# Patient Record
Sex: Female | Born: 1970 | Race: Black or African American | Hispanic: No | Smoking: Current every day smoker
Health system: Southern US, Community
[De-identification: ages and names within clinical notes are randomized; demographics above are authoritative.]

## PROBLEM LIST (undated history)

## (undated) ENCOUNTER — Ambulatory Visit (HOSPITAL_COMMUNITY): Admission: EM | Disposition: A | Discharge status: Home / Self Care | Payer: Medicare Other

## (undated) ENCOUNTER — Emergency Department (HOSPITAL_COMMUNITY): Discharge status: Against Medical Advice | Payer: Self-pay

## (undated) DIAGNOSIS — D759 Disease of blood and blood-forming organs, unspecified: Secondary | ICD-10-CM

## (undated) DIAGNOSIS — K219 Gastro-esophageal reflux disease without esophagitis: Secondary | ICD-10-CM

## (undated) DIAGNOSIS — I5022 Chronic systolic (congestive) heart failure: Secondary | ICD-10-CM

## (undated) DIAGNOSIS — G8929 Other chronic pain: Secondary | ICD-10-CM

## (undated) DIAGNOSIS — F1910 Other psychoactive substance abuse, uncomplicated: Secondary | ICD-10-CM

## (undated) DIAGNOSIS — E669 Obesity, unspecified: Secondary | ICD-10-CM

## (undated) DIAGNOSIS — M549 Dorsalgia, unspecified: Secondary | ICD-10-CM

## (undated) DIAGNOSIS — I639 Cerebral infarction, unspecified: Secondary | ICD-10-CM

## (undated) DIAGNOSIS — I10 Essential (primary) hypertension: Secondary | ICD-10-CM

## (undated) DIAGNOSIS — F419 Anxiety disorder, unspecified: Secondary | ICD-10-CM

## (undated) DIAGNOSIS — F319 Bipolar disorder, unspecified: Secondary | ICD-10-CM

## (undated) DIAGNOSIS — M1990 Unspecified osteoarthritis, unspecified site: Secondary | ICD-10-CM

## (undated) DIAGNOSIS — R0602 Shortness of breath: Secondary | ICD-10-CM

## (undated) DIAGNOSIS — D649 Anemia, unspecified: Secondary | ICD-10-CM

## (undated) HISTORY — PX: TUBAL LIGATION: SHX77

## (undated) HISTORY — PX: CHOLECYSTECTOMY: SHX55

---

## 2005-12-10 ENCOUNTER — Emergency Department (HOSPITAL_COMMUNITY): Admission: EM | Admit: 2005-12-10 | Discharge: 2005-12-10 | Payer: Self-pay | Admitting: Emergency Medicine

## 2006-08-03 ENCOUNTER — Emergency Department (HOSPITAL_COMMUNITY): Admission: EM | Admit: 2006-08-03 | Discharge: 2006-08-03 | Payer: Self-pay | Admitting: Emergency Medicine

## 2007-05-13 ENCOUNTER — Emergency Department (HOSPITAL_COMMUNITY): Admission: EM | Admit: 2007-05-13 | Discharge: 2007-05-13 | Payer: Self-pay | Admitting: Emergency Medicine

## 2007-05-14 ENCOUNTER — Emergency Department (HOSPITAL_COMMUNITY): Admission: EM | Admit: 2007-05-14 | Discharge: 2007-05-14 | Payer: Self-pay | Admitting: Emergency Medicine

## 2007-09-19 ENCOUNTER — Emergency Department (HOSPITAL_COMMUNITY): Admission: EM | Admit: 2007-09-19 | Discharge: 2007-09-19 | Payer: Self-pay | Admitting: Emergency Medicine

## 2007-11-01 ENCOUNTER — Ambulatory Visit: Payer: Self-pay | Admitting: *Deleted

## 2007-11-17 ENCOUNTER — Emergency Department (HOSPITAL_COMMUNITY): Admission: EM | Admit: 2007-11-17 | Discharge: 2007-11-17 | Payer: Self-pay | Admitting: Emergency Medicine

## 2007-12-23 ENCOUNTER — Encounter (INDEPENDENT_AMBULATORY_CARE_PROVIDER_SITE_OTHER): Payer: Self-pay | Admitting: Internal Medicine

## 2007-12-23 ENCOUNTER — Ambulatory Visit: Payer: Self-pay | Admitting: Family Medicine

## 2007-12-23 LAB — CONVERTED CEMR LAB
ALT: 16 units/L (ref 0–35)
AST: 16 units/L (ref 0–37)
Basophils Absolute: 0.1 10*3/uL (ref 0.0–0.1)
Basophils Relative: 1 % (ref 0–1)
CO2: 23 meq/L (ref 19–32)
Calcium: 9.3 mg/dL (ref 8.4–10.5)
Chloride: 103 meq/L (ref 96–112)
Lymphocytes Relative: 30 % (ref 12–46)
Neutro Abs: 5.2 10*3/uL (ref 1.7–7.7)
Neutrophils Relative %: 60 % (ref 43–77)
Platelets: 392 10*3/uL (ref 150–400)
RDW: 16.8 % — ABNORMAL HIGH (ref 11.5–15.5)
Sodium: 137 meq/L (ref 135–145)
Total Protein: 7.5 g/dL (ref 6.0–8.3)

## 2007-12-28 ENCOUNTER — Ambulatory Visit: Payer: Self-pay | Admitting: Internal Medicine

## 2008-01-16 ENCOUNTER — Emergency Department (HOSPITAL_COMMUNITY): Admission: EM | Admit: 2008-01-16 | Discharge: 2008-01-16 | Payer: Self-pay | Admitting: Emergency Medicine

## 2008-03-16 ENCOUNTER — Ambulatory Visit (HOSPITAL_COMMUNITY): Admission: RE | Admit: 2008-03-16 | Discharge: 2008-03-16 | Payer: Self-pay | Admitting: Surgery

## 2008-03-16 ENCOUNTER — Encounter (INDEPENDENT_AMBULATORY_CARE_PROVIDER_SITE_OTHER): Payer: Self-pay | Admitting: Surgery

## 2008-03-30 ENCOUNTER — Emergency Department (HOSPITAL_COMMUNITY): Admission: EM | Admit: 2008-03-30 | Discharge: 2008-03-30 | Payer: Self-pay | Admitting: Emergency Medicine

## 2008-04-08 ENCOUNTER — Emergency Department (HOSPITAL_COMMUNITY): Admission: EM | Admit: 2008-04-08 | Discharge: 2008-04-08 | Payer: Self-pay | Admitting: Emergency Medicine

## 2008-04-10 ENCOUNTER — Encounter (INDEPENDENT_AMBULATORY_CARE_PROVIDER_SITE_OTHER): Payer: Self-pay | Admitting: Internal Medicine

## 2008-04-10 ENCOUNTER — Ambulatory Visit: Payer: Self-pay | Admitting: Family Medicine

## 2008-04-10 LAB — CONVERTED CEMR LAB
Benzodiazepines.: NEGATIVE
Creatinine,U: 280.6 mg/dL
Methadone: NEGATIVE
Phencyclidine (PCP): NEGATIVE
Propoxyphene: NEGATIVE

## 2008-04-18 ENCOUNTER — Emergency Department (HOSPITAL_COMMUNITY): Admission: EM | Admit: 2008-04-18 | Discharge: 2008-04-18 | Payer: Self-pay | Admitting: Family Medicine

## 2008-05-16 ENCOUNTER — Emergency Department (HOSPITAL_COMMUNITY): Admission: EM | Admit: 2008-05-16 | Discharge: 2008-05-16 | Payer: Self-pay | Admitting: Family Medicine

## 2008-06-25 ENCOUNTER — Emergency Department (HOSPITAL_COMMUNITY): Admission: EM | Admit: 2008-06-25 | Discharge: 2008-06-25 | Payer: Self-pay | Admitting: Emergency Medicine

## 2008-07-17 ENCOUNTER — Emergency Department (HOSPITAL_COMMUNITY): Admission: EM | Admit: 2008-07-17 | Discharge: 2008-07-17 | Payer: Self-pay | Admitting: Emergency Medicine

## 2008-07-18 ENCOUNTER — Emergency Department (HOSPITAL_COMMUNITY): Admission: EM | Admit: 2008-07-18 | Discharge: 2008-07-18 | Payer: Self-pay | Admitting: Emergency Medicine

## 2008-07-27 ENCOUNTER — Ambulatory Visit: Payer: Self-pay | Admitting: Family Medicine

## 2008-08-05 ENCOUNTER — Emergency Department (HOSPITAL_COMMUNITY): Admission: EM | Admit: 2008-08-05 | Discharge: 2008-08-06 | Payer: Self-pay | Admitting: Emergency Medicine

## 2008-08-17 ENCOUNTER — Emergency Department (HOSPITAL_COMMUNITY): Admission: EM | Admit: 2008-08-17 | Discharge: 2008-08-17 | Payer: Self-pay | Admitting: Emergency Medicine

## 2008-09-12 ENCOUNTER — Emergency Department (HOSPITAL_COMMUNITY): Admission: EM | Admit: 2008-09-12 | Discharge: 2008-09-13 | Payer: Self-pay | Admitting: Emergency Medicine

## 2008-09-27 ENCOUNTER — Emergency Department (HOSPITAL_COMMUNITY): Admission: EM | Admit: 2008-09-27 | Discharge: 2008-09-27 | Payer: Self-pay | Admitting: Emergency Medicine

## 2008-10-01 ENCOUNTER — Emergency Department (HOSPITAL_COMMUNITY): Admission: EM | Admit: 2008-10-01 | Discharge: 2008-10-02 | Payer: Self-pay | Admitting: Emergency Medicine

## 2008-10-02 ENCOUNTER — Ambulatory Visit: Payer: Self-pay | Admitting: Family Medicine

## 2008-10-16 ENCOUNTER — Ambulatory Visit: Payer: Self-pay | Admitting: Family Medicine

## 2008-10-20 ENCOUNTER — Emergency Department (HOSPITAL_COMMUNITY): Admission: EM | Admit: 2008-10-20 | Discharge: 2008-10-20 | Payer: Self-pay | Admitting: Emergency Medicine

## 2008-11-16 ENCOUNTER — Emergency Department (HOSPITAL_COMMUNITY): Admission: EM | Admit: 2008-11-16 | Discharge: 2008-11-17 | Payer: Self-pay | Admitting: Emergency Medicine

## 2008-11-20 ENCOUNTER — Emergency Department (HOSPITAL_COMMUNITY): Admission: EM | Admit: 2008-11-20 | Discharge: 2008-11-21 | Payer: Self-pay | Admitting: Emergency Medicine

## 2008-11-20 ENCOUNTER — Ambulatory Visit: Payer: Self-pay | Admitting: Family Medicine

## 2008-11-30 ENCOUNTER — Emergency Department (HOSPITAL_COMMUNITY): Admission: EM | Admit: 2008-11-30 | Discharge: 2008-11-30 | Payer: Self-pay | Admitting: Emergency Medicine

## 2008-12-12 ENCOUNTER — Ambulatory Visit: Payer: Self-pay | Admitting: Obstetrics and Gynecology

## 2008-12-12 ENCOUNTER — Encounter: Payer: Self-pay | Admitting: Obstetrics and Gynecology

## 2008-12-12 LAB — CONVERTED CEMR LAB
Hemoglobin: 10.7 g/dL — ABNORMAL LOW (ref 12.0–15.0)
MCHC: 30.2 g/dL (ref 30.0–36.0)
RBC: 5.89 M/uL — ABNORMAL HIGH (ref 3.87–5.11)
RDW: 16.2 % — ABNORMAL HIGH (ref 11.5–15.5)

## 2009-01-02 ENCOUNTER — Emergency Department (HOSPITAL_COMMUNITY): Admission: EM | Admit: 2009-01-02 | Discharge: 2009-01-02 | Payer: Self-pay | Admitting: Family Medicine

## 2009-01-09 ENCOUNTER — Ambulatory Visit: Payer: Self-pay | Admitting: Obstetrics and Gynecology

## 2009-02-11 ENCOUNTER — Emergency Department (HOSPITAL_COMMUNITY): Admission: EM | Admit: 2009-02-11 | Discharge: 2009-02-11 | Payer: Self-pay | Admitting: Emergency Medicine

## 2009-02-17 ENCOUNTER — Emergency Department (HOSPITAL_COMMUNITY): Admission: EM | Admit: 2009-02-17 | Discharge: 2009-02-17 | Payer: Self-pay | Admitting: Emergency Medicine

## 2009-02-18 ENCOUNTER — Telehealth (INDEPENDENT_AMBULATORY_CARE_PROVIDER_SITE_OTHER): Payer: Self-pay | Admitting: *Deleted

## 2009-02-19 ENCOUNTER — Ambulatory Visit: Payer: Self-pay | Admitting: Family Medicine

## 2009-04-05 ENCOUNTER — Emergency Department (HOSPITAL_COMMUNITY): Admission: EM | Admit: 2009-04-05 | Discharge: 2009-04-05 | Payer: Self-pay | Admitting: Family Medicine

## 2009-04-06 ENCOUNTER — Emergency Department (HOSPITAL_COMMUNITY): Admission: EM | Admit: 2009-04-06 | Discharge: 2009-04-07 | Payer: Self-pay | Admitting: Emergency Medicine

## 2009-06-20 ENCOUNTER — Ambulatory Visit: Payer: Self-pay | Admitting: Family Medicine

## 2009-10-17 ENCOUNTER — Ambulatory Visit: Payer: Self-pay | Admitting: Family Medicine

## 2010-05-14 LAB — URINALYSIS, ROUTINE W REFLEX MICROSCOPIC
Glucose, UA: NEGATIVE mg/dL
Hgb urine dipstick: NEGATIVE
Protein, ur: NEGATIVE mg/dL
Specific Gravity, Urine: 1.026 (ref 1.005–1.030)
Urobilinogen, UA: 0.2 mg/dL (ref 0.0–1.0)

## 2010-05-14 LAB — DIFFERENTIAL
Basophils Absolute: 0 10*3/uL (ref 0.0–0.1)
Basophils Relative: 0 % (ref 0–1)
Eosinophils Absolute: 0.1 10*3/uL (ref 0.0–0.7)
Lymphs Abs: 3.2 10*3/uL (ref 0.7–4.0)
Monocytes Absolute: 0.7 10*3/uL (ref 0.1–1.0)
Neutro Abs: 6.4 10*3/uL (ref 1.7–7.7)

## 2010-05-14 LAB — CBC
HCT: 32.4 % — ABNORMAL LOW (ref 36.0–46.0)
Hemoglobin: 10.3 g/dL — ABNORMAL LOW (ref 12.0–15.0)
MCHC: 31.8 g/dL (ref 30.0–36.0)
Platelets: 286 10*3/uL (ref 150–400)
RDW: 16.7 % — ABNORMAL HIGH (ref 11.5–15.5)

## 2010-05-14 LAB — URINE CULTURE

## 2010-05-14 LAB — POCT I-STAT, CHEM 8
BUN: 13 mg/dL (ref 6–23)
Chloride: 103 mEq/L (ref 96–112)
Creatinine, Ser: 0.6 mg/dL (ref 0.4–1.2)
Potassium: 3.4 mEq/L — ABNORMAL LOW (ref 3.5–5.1)
Sodium: 138 mEq/L (ref 135–145)

## 2010-05-14 LAB — GC/CHLAMYDIA PROBE AMP, GENITAL
Chlamydia, DNA Probe: NEGATIVE
GC Probe Amp, Genital: NEGATIVE

## 2010-05-14 LAB — WET PREP, GENITAL
Clue Cells Wet Prep HPF POC: NONE SEEN
Trich, Wet Prep: NONE SEEN
WBC, Wet Prep HPF POC: NONE SEEN

## 2010-05-26 LAB — URINALYSIS, ROUTINE W REFLEX MICROSCOPIC
Bilirubin Urine: NEGATIVE
Ketones, ur: NEGATIVE mg/dL
Nitrite: NEGATIVE
Urobilinogen, UA: 1 mg/dL (ref 0.0–1.0)
pH: 7 (ref 5.0–8.0)

## 2010-05-26 LAB — DIFFERENTIAL
Basophils Relative: 1 % (ref 0–1)
Eosinophils Absolute: 0.2 10*3/uL (ref 0.0–0.7)
Monocytes Absolute: 0.5 10*3/uL (ref 0.1–1.0)
Neutrophils Relative %: 64 % (ref 43–77)

## 2010-05-26 LAB — CBC
Hemoglobin: 10.1 g/dL — ABNORMAL LOW (ref 12.0–15.0)
MCHC: 30.9 g/dL (ref 30.0–36.0)
RDW: 18.1 % — ABNORMAL HIGH (ref 11.5–15.5)

## 2010-05-28 LAB — POCT URINALYSIS DIP (DEVICE)
Glucose, UA: NEGATIVE mg/dL
Nitrite: NEGATIVE

## 2010-05-28 LAB — GC/CHLAMYDIA PROBE AMP, GENITAL: Chlamydia, DNA Probe: NEGATIVE

## 2010-05-28 LAB — URINE CULTURE: Colony Count: 95000

## 2010-05-29 LAB — POCT PREGNANCY, URINE: Preg Test, Ur: NEGATIVE

## 2010-05-29 LAB — URINALYSIS, ROUTINE W REFLEX MICROSCOPIC
Nitrite: NEGATIVE
Specific Gravity, Urine: 1.023 (ref 1.005–1.030)
Urobilinogen, UA: 0.2 mg/dL (ref 0.0–1.0)

## 2010-05-29 LAB — BASIC METABOLIC PANEL
Calcium: 8.8 mg/dL (ref 8.4–10.5)
GFR calc Af Amer: >60 mL/min (ref >60)
GFR calc non Af Amer: >60 mL/min (ref >60)
Sodium: 137 mEq/L (ref 135–145)

## 2010-05-29 LAB — DIFFERENTIAL
Basophils Absolute: 0 10*3/uL (ref 0.0–0.1)
Basophils Relative: 0 % (ref 0–1)
Lymphocytes Relative: 19 % (ref 12–46)
Monocytes Relative: 5 % (ref 3–12)
Neutro Abs: 6.7 10*3/uL (ref 1.7–7.7)
Neutrophils Relative %: 74 % (ref 43–77)

## 2010-05-29 LAB — CBC
Hemoglobin: 11.1 g/dL — ABNORMAL LOW (ref 12.0–15.0)
RBC: 5.76 MIL/uL — ABNORMAL HIGH (ref 3.87–5.11)
RDW: 16 % — ABNORMAL HIGH (ref 11.5–15.5)

## 2010-05-29 LAB — WET PREP, GENITAL

## 2010-05-29 LAB — GC/CHLAMYDIA PROBE AMP, GENITAL
Chlamydia, DNA Probe: NEGATIVE
GC Probe Amp, Genital: NEGATIVE

## 2010-05-30 LAB — DIFFERENTIAL
Basophils Absolute: 0.1 10*3/uL (ref 0.0–0.1)
Basophils Relative: 1 % (ref 0–1)
Lymphocytes Relative: 25 % (ref 12–46)
Lymphocytes Relative: 33 % (ref 12–46)
Lymphs Abs: 2.3 10*3/uL (ref 0.7–4.0)
Monocytes Relative: 8 % (ref 3–12)
Monocytes Relative: 9 % (ref 3–12)
Neutro Abs: 4.9 10*3/uL (ref 1.7–7.7)
Neutro Abs: 5.7 10*3/uL (ref 1.7–7.7)
Neutrophils Relative %: 55 % (ref 43–77)
Neutrophils Relative %: 62 % (ref 43–77)

## 2010-05-30 LAB — CBC
Hemoglobin: 11.1 g/dL — ABNORMAL LOW (ref 12.0–15.0)
Hemoglobin: 12.3 g/dL (ref 12.0–15.0)
MCHC: 30.8 g/dL (ref 30.0–36.0)
MCHC: 31.3 g/dL (ref 30.0–36.0)
MCV: 61.5 fL — ABNORMAL LOW (ref 78.0–100.0)
MCV: 61.6 fL — ABNORMAL LOW (ref 78.0–100.0)
RBC: 5.73 MIL/uL — ABNORMAL HIGH (ref 3.87–5.11)
RDW: 16.1 % — ABNORMAL HIGH (ref 11.5–15.5)
RDW: 16.3 % — ABNORMAL HIGH (ref 11.5–15.5)

## 2010-05-30 LAB — BASIC METABOLIC PANEL
CO2: 30 mEq/L (ref 19–32)
Calcium: 9.9 mg/dL (ref 8.4–10.5)
Chloride: 105 mEq/L (ref 96–112)
Creatinine, Ser: 0.65 mg/dL (ref 0.4–1.2)
GFR calc Af Amer: >60 mL/min (ref >60)
Glucose, Bld: 95 mg/dL (ref 70–99)
Sodium: 141 mEq/L (ref 135–145)

## 2010-05-30 LAB — LIPASE, BLOOD: Lipase: 19 U/L (ref 11–59)

## 2010-05-30 LAB — POCT CARDIAC MARKERS
CKMB, poc: <1 ng/mL — ABNORMAL LOW (ref 1.0–8.0)
Troponin i, poc: <0.05 ng/mL (ref 0.00–0.09)

## 2010-05-30 LAB — URINALYSIS, ROUTINE W REFLEX MICROSCOPIC
Hgb urine dipstick: NEGATIVE
Ketones, ur: 15 mg/dL — AB
Nitrite: NEGATIVE
Specific Gravity, Urine: 1.027 (ref 1.005–1.030)
Urobilinogen, UA: 1 mg/dL (ref 0.0–1.0)

## 2010-05-30 LAB — COMPREHENSIVE METABOLIC PANEL
BUN: 11 mg/dL (ref 6–23)
CO2: 29 mEq/L (ref 19–32)
Calcium: 9 mg/dL (ref 8.4–10.5)
Creatinine, Ser: 0.69 mg/dL (ref 0.4–1.2)
GFR calc non Af Amer: >60 mL/min (ref >60)
Glucose, Bld: 82 mg/dL (ref 70–99)
Sodium: 139 mEq/L (ref 135–145)
Total Protein: 7.2 g/dL (ref 6.0–8.3)

## 2010-05-31 LAB — DIFFERENTIAL
Basophils Absolute: 0.1 K/uL (ref 0.0–0.1)
Basophils Relative: 1 % (ref 0–1)
Eosinophils Absolute: 0.3 K/uL (ref 0.0–0.7)
Eosinophils Relative: 4 % (ref 0–5)
Lymphocytes Relative: 23 % (ref 12–46)
Lymphs Abs: 1.9 K/uL (ref 0.7–4.0)
Monocytes Absolute: 0.7 K/uL (ref 0.1–1.0)
Monocytes Relative: 9 % (ref 3–12)
Neutro Abs: 5.1 K/uL (ref 1.7–7.7)
Neutrophils Relative %: 63 % (ref 43–77)

## 2010-05-31 LAB — GC/CHLAMYDIA PROBE AMP, GENITAL
Chlamydia, DNA Probe: NEGATIVE
GC Probe Amp, Genital: NEGATIVE

## 2010-05-31 LAB — POCT I-STAT, CHEM 8
BUN: 10 mg/dL (ref 6–23)
Calcium, Ion: 1.12 mmol/L (ref 1.12–1.32)
Chloride: 107 meq/L (ref 96–112)
Creatinine, Ser: 0.4 mg/dL (ref 0.4–1.2)
Glucose, Bld: 87 mg/dL (ref 70–99)
HCT: 37 % (ref 36.0–46.0)
Hemoglobin: 12.6 g/dL (ref 12.0–15.0)
Potassium: 3.9 meq/L (ref 3.5–5.1)
Sodium: 139 meq/L (ref 135–145)
TCO2: 21 mmol/L (ref 0–100)

## 2010-05-31 LAB — POCT PREGNANCY, URINE: Preg Test, Ur: NEGATIVE

## 2010-05-31 LAB — URINALYSIS, ROUTINE W REFLEX MICROSCOPIC
Glucose, UA: NEGATIVE mg/dL
Glucose, UA: NEGATIVE mg/dL
Ketones, ur: NEGATIVE mg/dL
Leukocytes, UA: NEGATIVE
Protein, ur: 100 mg/dL — AB
Protein, ur: NEGATIVE mg/dL
Specific Gravity, Urine: 1.03 (ref 1.005–1.030)
Urobilinogen, UA: 1 mg/dL (ref 0.0–1.0)
Urobilinogen, UA: 1 mg/dL (ref 0.0–1.0)

## 2010-05-31 LAB — URINE CULTURE
Colony Count: 35000
Colony Count: 65000

## 2010-05-31 LAB — CBC
HCT: 35.2 % — ABNORMAL LOW (ref 36.0–46.0)
Hemoglobin: 10.9 g/dL — ABNORMAL LOW (ref 12.0–15.0)
MCHC: 31.1 g/dL (ref 30.0–36.0)
MCV: 61.6 fL — ABNORMAL LOW (ref 78.0–100.0)
Platelets: 283 K/uL (ref 150–400)
RBC: 5.71 MIL/uL — ABNORMAL HIGH (ref 3.87–5.11)
RDW: 17 % — ABNORMAL HIGH (ref 11.5–15.5)
WBC: 8.1 K/uL (ref 4.0–10.5)

## 2010-05-31 LAB — URINE MICROSCOPIC-ADD ON

## 2010-06-02 LAB — DIFFERENTIAL
Basophils Absolute: 0.1 10*3/uL (ref 0.0–0.1)
Basophils Relative: 1 % (ref 0–1)
Eosinophils Absolute: 0.3 10*3/uL (ref 0.0–0.7)
Eosinophils Relative: 3 % (ref 0–5)
Monocytes Absolute: 0.5 10*3/uL (ref 0.1–1.0)

## 2010-06-02 LAB — BASIC METABOLIC PANEL
BUN: 7 mg/dL (ref 6–23)
CO2: 26 mEq/L (ref 19–32)
Glucose, Bld: 106 mg/dL — ABNORMAL HIGH (ref 70–99)
Potassium: 3.5 mEq/L (ref 3.5–5.1)
Sodium: 138 mEq/L (ref 135–145)

## 2010-06-02 LAB — CBC
HCT: 30.8 % — ABNORMAL LOW (ref 36.0–46.0)
Hemoglobin: 9.9 g/dL — ABNORMAL LOW (ref 12.0–15.0)
MCHC: 32.2 g/dL (ref 30.0–36.0)
RDW: 16.5 % — ABNORMAL HIGH (ref 11.5–15.5)

## 2010-06-02 LAB — HERPES SIMPLEX VIRUS CULTURE

## 2010-06-02 LAB — POCT CARDIAC MARKERS
CKMB, poc: <1 ng/mL — ABNORMAL LOW (ref 1.0–8.0)
Troponin i, poc: <0.05 ng/mL (ref 0.00–0.09)

## 2010-06-03 LAB — URINE MICROSCOPIC-ADD ON

## 2010-06-03 LAB — POCT PREGNANCY, URINE: Preg Test, Ur: NEGATIVE

## 2010-06-03 LAB — COMPREHENSIVE METABOLIC PANEL
AST: 24 U/L (ref 0–37)
Albumin: 3.9 g/dL (ref 3.5–5.2)
Calcium: 8.7 mg/dL (ref 8.4–10.5)
Chloride: 103 mEq/L (ref 96–112)
Creatinine, Ser: 0.51 mg/dL (ref 0.4–1.2)
GFR calc Af Amer: >60 mL/min (ref >60)

## 2010-06-03 LAB — RAPID URINE DRUG SCREEN, HOSP PERFORMED
Benzodiazepines: NOT DETECTED
Cocaine: NOT DETECTED

## 2010-06-03 LAB — RPR: RPR Ser Ql: NONREACTIVE

## 2010-06-03 LAB — URINALYSIS, ROUTINE W REFLEX MICROSCOPIC
Bilirubin Urine: NEGATIVE
Nitrite: NEGATIVE
Specific Gravity, Urine: 1.024 (ref 1.005–1.030)
Urobilinogen, UA: 1 mg/dL (ref 0.0–1.0)

## 2010-06-03 LAB — DIFFERENTIAL
Basophils Relative: 1 % (ref 0–1)
Eosinophils Relative: 1 % (ref 0–5)
Lymphs Abs: 2.4 10*3/uL (ref 0.7–4.0)
Monocytes Relative: 4 % (ref 3–12)
Neutro Abs: 6.9 10*3/uL (ref 1.7–7.7)

## 2010-06-03 LAB — CBC
MCHC: 31.4 g/dL (ref 30.0–36.0)
MCV: 59.8 fL — ABNORMAL LOW (ref 78.0–100.0)
Platelets: 324 10*3/uL (ref 150–400)
WBC: 9.9 10*3/uL (ref 4.0–10.5)

## 2010-06-03 LAB — WET PREP, GENITAL: Yeast Wet Prep HPF POC: NONE SEEN

## 2010-06-09 LAB — BASIC METABOLIC PANEL
CO2: 25 mEq/L (ref 19–32)
GFR calc Af Amer: >60 mL/min (ref >60)
GFR calc non Af Amer: >60 mL/min (ref >60)
Glucose, Bld: 92 mg/dL (ref 70–99)
Potassium: 4.1 mEq/L (ref 3.5–5.1)
Sodium: 137 mEq/L (ref 135–145)

## 2010-06-10 LAB — COMPREHENSIVE METABOLIC PANEL
ALT: 15 U/L (ref 0–35)
AST: 16 U/L (ref 0–37)
CO2: 24 mEq/L (ref 19–32)
Calcium: 8.4 mg/dL (ref 8.4–10.5)
Chloride: 102 mEq/L (ref 96–112)
GFR calc non Af Amer: >60 mL/min (ref >60)
Glucose, Bld: 105 mg/dL — ABNORMAL HIGH (ref 70–99)
Sodium: 135 mEq/L (ref 135–145)
Total Bilirubin: 0.5 mg/dL (ref 0.3–1.2)

## 2010-06-10 LAB — DIFFERENTIAL
Basophils Absolute: 0.2 10*3/uL — ABNORMAL HIGH (ref 0.0–0.1)
Eosinophils Absolute: 0.3 10*3/uL (ref 0.0–0.7)
Lymphs Abs: 2.2 10*3/uL (ref 0.7–4.0)
Monocytes Absolute: 0.6 10*3/uL (ref 0.1–1.0)
Monocytes Relative: 7 % (ref 3–12)
Neutro Abs: 5.3 10*3/uL (ref 1.7–7.7)

## 2010-06-10 LAB — CBC
Hemoglobin: 10.1 g/dL — ABNORMAL LOW (ref 12.0–15.0)
MCHC: 31.4 g/dL (ref 30.0–36.0)
MCV: 56.7 fL — ABNORMAL LOW (ref 78.0–100.0)
RBC: 5.68 MIL/uL — ABNORMAL HIGH (ref 3.87–5.11)
WBC: 8.6 10*3/uL (ref 4.0–10.5)

## 2010-06-10 LAB — LIPASE, BLOOD: Lipase: 19 U/L (ref 11–59)

## 2010-07-08 NOTE — Op Note (Signed)
NAME:  Kelly Novak, Kelly Novak NO.:  [id number]   MEDICAL RECORD NO.:  [id number]          PATIENT TYPE:  AMB   LOCATION:  DAY                          FACILITY:  [facility]   PHYSICIAN:  Ardeth Sportsman, MD     DATE OF BIRTH:  [birth year]   DATE OF PROCEDURE:  DATE OF DISCHARGE:                               OPERATIVE REPORT   PRIMARY CARE PHYSICIAN:  Over at [facility].  She is also seen by April  Palumbo-Rasch, MD, I believe at [facility].   SURGEON:  Ardeth Sportsman, MD.   ASSISTANT:  None.   PREOPERATIVE DIAGNOSES:  Right upper quadrant pain presumed to be  secondary to symptomatic cholecystolithiasis with possible chronic  cholecystitis.   POSTOPERATIVE DIAGNOSES:  1. Symptomatic cholecystolithiasis.  2. Probable chronic cholecystitis.  3. Fitz-Hugh-Curtis syndrome with dense phrenohepatic adhesions.   PROCEDURE PERFORMED:  1. Laparoscopic lysis of adhesions x 30 minutes.  2. Laparoscopic cholecystectomy with intraoperative cholangiogram.   ANESTHESIA:  1. General anesthesia.  2. Local anesthetic and field block around all port sites.   SPECIMEN:  Gallbladder.   DRAINS:  None.   ESTIMATED BLOOD LOSS:  Less than 20 mL.   COMPLICATIONS:  None apparent.   INDICATIONS:  Kelly Novak is a pleasant 40 year old morbidly obese female  with classic story of biliary colic and some persistent upper abdominal  pain.  She was found to have sludge in her gallbladder with possible  small stones.  Differential diagnoses have been discussed and relatively  ruled out.   The anatomy and physiology of hepatobiliary and pancreatic function was  discussed.  Pathophysiology of cholecystolithiasis and its natural  history and risks were discussed and options discussed.  The  recommendation was made for diagnostic laparoscopy with laparoscopic  cholecystectomy and intraoperative cholangiogram.  The risks, benefits  and alternatives were discussed.  Questions answered.  She agreed  to  proceed.   FINDINGS:  She had some mild gallbladder wall thickening and a very  narrow cystic duct.  Her cholangiogram appeared normal with classic  anatomy.   She did have some moderate amount of adhesions of her liver to her  diaphragm consistent  with Fitz-Hugh-Curtis syndrome.   DESCRIPTION OF PROCEDURE:  Informed consent was confirmed.  The patient  underwent general anesthesia without any difficulty.  She had sequential  pressure devices active during the entire case.  She had voided just  prior to going to the operating room.  She underwent general anesthesia  without any difficulty.  She was in supine with both arms tucked and the  foot board in  place.  Her abdomen was a clipped, prepped and draped in  the sterile fashion.  Surgical time-out was performed to confirm our  plan.   A 5 mm port was placed in the right upper quadrant using optical entry  technique with the patient steep in reverse Trendelenburg and right-side  up without any difficulty.  Camera inspection revealed no intraabdominal  injury.  Under direct visualization the 5 mm final ports were placed in  the right flank and just superior  to the umbilicus.  A 10 mm port was  tunneled through the falciform ligament in the subxiphoid region.   The gallbladder fundus was grasped and elevated cephalad.  Peritoneal  coverings were freed between the gallbladder and the liver on the  anteriomedial and posteriolateral walls of the gallbladder.  Circumferential dissection was done to free the proximal half of the  gallbladder off the liver bed to get a classic critical view.   I saw two structures going from gallbladder down to the porta hepatis.  One was more on the anterior wall consistent with the cystic artery.  One clip on the gallbladder side with two clips slightly proximally were  placed & the artery transected.  A smaller wispy posterior arterial  branch was isolated and cauterized.  This left the cystic  duct going  down from the infundibular gallbladder down to the porta hepatis.  It  was carefully dissected and skeletonized to get a good view.  Two clips  were placed in the infundibulum and a partial cystic ductotomy was  performed.  A 5-French cholangiogram catheter was passed through  subxiphoid stab incision and placed into the cystic duct without any  difficulty.   A cholangiogram was run using dilute radio-opaque contrast and  continuous fluoroscopy.  Contrast flowed well from a side branch  consistent with cystic duct cannulization.  Contrast flowed into the  common hepatic duct and refluxed into the right & left intrahepatic  chains.  It flowed down the common bile duct across a normal ampulla  into the duodenum consistent with a normal cholangiogram.  Cholangiocatheter was removed and four clips were placed in the cystic  duct since it was long.  Cystic duct transection was completed.   The gallbladder was freed from its remaining attachments on the liver  bed.  There was a tongue of liver, a small 1 x 2 cm tongue of liver that  was on the anteromedial gallbladder wall.  I went ahead and freed that  with the gallbladder.  The gallbladder was removed out the subxiphoid  port with some gentle fascial dilation.  The fascial defect was  reapproximated using zero Vicryl stitch without any difficulty.   Camera inspection confirmed the numerous adhesions of both lobes of  the  liver to the diaphragm.  Since she had some chronic abdominal pain as  well, therefore I went ahead and freed these off using hook cautery  under direct visualization with good release all around with  good  hemostasis.  Irrigation was done and hemostasis was excellent.  Capnoperitoneum was evacuated and the rest of the ports were removed.  Skin was closed using 4-0 Monocryl stitch.  Sterile dressing was  applied.   I discussed postoperative care with the patient just prior to surgery.  We are trying to  find her family to discuss postoperative care with the  patient as well.  I did leave detailed instructions as well.  She should  be able to go home later today.      Ardeth Sportsman, MD  Electronically Signed     SCG/MEDQ  D:  03/16/2008  T:  03/16/2008  Job:  [id number]   cc:   April Palumbo-Rasch, MD   [facility] Banker  Fax: [phone]

## 2010-08-15 ENCOUNTER — Encounter (HOSPITAL_COMMUNITY): Payer: Self-pay | Admitting: Radiology

## 2010-08-15 ENCOUNTER — Emergency Department (HOSPITAL_COMMUNITY)
Admission: EM | Admit: 2010-08-15 | Discharge: 2010-08-15 | Disposition: A | Discharge status: Home / Self Care | Payer: Self-pay | Attending: Emergency Medicine | Admitting: Emergency Medicine

## 2010-08-15 ENCOUNTER — Emergency Department (HOSPITAL_COMMUNITY): Payer: Self-pay

## 2010-08-15 DIAGNOSIS — D573 Sickle-cell trait: Secondary | ICD-10-CM | POA: Insufficient documentation

## 2010-08-15 DIAGNOSIS — R059 Cough, unspecified: Secondary | ICD-10-CM | POA: Insufficient documentation

## 2010-08-15 DIAGNOSIS — F319 Bipolar disorder, unspecified: Secondary | ICD-10-CM | POA: Insufficient documentation

## 2010-08-15 DIAGNOSIS — R109 Unspecified abdominal pain: Secondary | ICD-10-CM | POA: Insufficient documentation

## 2010-08-15 DIAGNOSIS — R05 Cough: Secondary | ICD-10-CM | POA: Insufficient documentation

## 2010-08-15 DIAGNOSIS — Z79899 Other long term (current) drug therapy: Secondary | ICD-10-CM | POA: Insufficient documentation

## 2010-08-15 DIAGNOSIS — R10819 Abdominal tenderness, unspecified site: Secondary | ICD-10-CM | POA: Insufficient documentation

## 2010-08-15 DIAGNOSIS — R112 Nausea with vomiting, unspecified: Secondary | ICD-10-CM | POA: Insufficient documentation

## 2010-08-15 DIAGNOSIS — Z9889 Other specified postprocedural states: Secondary | ICD-10-CM | POA: Insufficient documentation

## 2010-08-15 DIAGNOSIS — IMO0001 Reserved for inherently not codable concepts without codable children: Secondary | ICD-10-CM | POA: Insufficient documentation

## 2010-08-15 DIAGNOSIS — I10 Essential (primary) hypertension: Secondary | ICD-10-CM | POA: Insufficient documentation

## 2010-08-15 LAB — DIFFERENTIAL
Basophils Absolute: 0 10*3/uL (ref 0.0–0.1)
Eosinophils Relative: 1 % (ref 0–5)
Lymphocytes Relative: 25 % (ref 12–46)
Lymphs Abs: 2.6 10*3/uL (ref 0.7–4.0)
Neutro Abs: 7 10*3/uL (ref 1.7–7.7)
Neutrophils Relative %: 66 % (ref 43–77)

## 2010-08-15 LAB — COMPREHENSIVE METABOLIC PANEL
AST: 16 U/L (ref 0–37)
Albumin: 3.6 g/dL (ref 3.5–5.2)
Alkaline Phosphatase: 76 U/L (ref 39–117)
BUN: 16 mg/dL (ref 6–23)
CO2: 25 mEq/L (ref 19–32)
Chloride: 102 mEq/L (ref 96–112)
GFR calc non Af Amer: >60 mL/min (ref >60)
Potassium: 3.5 mEq/L (ref 3.5–5.1)
Total Bilirubin: 0.6 mg/dL (ref 0.3–1.2)

## 2010-08-15 LAB — CBC
HCT: 33 % — ABNORMAL LOW (ref 36.0–46.0)
MCV: 58.5 fL — ABNORMAL LOW (ref 78.0–100.0)
Platelets: 318 10*3/uL (ref 150–400)
RBC: 5.64 MIL/uL — ABNORMAL HIGH (ref 3.87–5.11)
RDW: 17.1 % — ABNORMAL HIGH (ref 11.5–15.5)
WBC: 10.5 10*3/uL (ref 4.0–10.5)

## 2010-08-15 LAB — URINE MICROSCOPIC-ADD ON

## 2010-08-15 LAB — URINALYSIS, ROUTINE W REFLEX MICROSCOPIC
Glucose, UA: NEGATIVE mg/dL
Hgb urine dipstick: NEGATIVE
Ketones, ur: 15 mg/dL — AB
Protein, ur: 30 mg/dL — AB
Urobilinogen, UA: 1 mg/dL (ref 0.0–1.0)

## 2010-08-15 MED ORDER — IOHEXOL 300 MG/ML  SOLN
100.0000 mL | Freq: Once | INTRAMUSCULAR | Status: AC | PRN
  Administered 2010-08-15: 100 mL via INTRAVENOUS

## 2010-11-17 LAB — DIFFERENTIAL
Basophils Absolute: 0.1
Basophils Relative: 1
Eosinophils Absolute: 0.4
Eosinophils Absolute: 0.4
Eosinophils Relative: 5
Lymphocytes Relative: 28
Lymphs Abs: 2.4
Monocytes Relative: 6
Monocytes Relative: 8
Neutro Abs: 4
Neutrophils Relative %: 60

## 2010-11-17 LAB — URINALYSIS, ROUTINE W REFLEX MICROSCOPIC
Glucose, UA: NEGATIVE
Protein, ur: NEGATIVE
Protein, ur: NEGATIVE
Specific Gravity, Urine: 1.029
Urobilinogen, UA: 0.2
pH: 6.5

## 2010-11-17 LAB — COMPREHENSIVE METABOLIC PANEL
AST: 14
Albumin: 3.3 — ABNORMAL LOW
Alkaline Phosphatase: 65
BUN: 14
CO2: 22
Calcium: 8.2 — ABNORMAL LOW
Chloride: 104
Chloride: 109
Creatinine, Ser: 0.97
GFR calc Af Amer: >60
GFR calc non Af Amer: >60
Potassium: 3.6
Total Bilirubin: 0.8
Total Bilirubin: 1

## 2010-11-17 LAB — CBC
Hemoglobin: 10.6 — ABNORMAL LOW
Platelets: 321
RBC: 5.61 — ABNORMAL HIGH
WBC: 7.5
WBC: 8.3

## 2010-11-21 LAB — URINALYSIS, ROUTINE W REFLEX MICROSCOPIC
Bilirubin Urine: NEGATIVE
Glucose, UA: NEGATIVE
Hgb urine dipstick: NEGATIVE
Nitrite: NEGATIVE
Specific Gravity, Urine: 1.018
pH: 8

## 2010-11-21 LAB — COMPREHENSIVE METABOLIC PANEL
Albumin: 3.5
BUN: 8
CO2: 23
Chloride: 109
Creatinine, Ser: 0.53
GFR calc non Af Amer: >60
Glucose, Bld: 92
Total Bilirubin: 0.8

## 2010-11-21 LAB — RAPID URINE DRUG SCREEN, HOSP PERFORMED
Barbiturates: NOT DETECTED
Benzodiazepines: NOT DETECTED
Opiates: NOT DETECTED

## 2010-11-21 LAB — CBC
HCT: 32.6 — ABNORMAL LOW
Hemoglobin: 10.1 — ABNORMAL LOW
MCV: 57.2 — ABNORMAL LOW
Platelets: 321
WBC: 7.4

## 2010-11-21 LAB — URINE MICROSCOPIC-ADD ON

## 2010-11-21 LAB — POCT I-STAT, CHEM 8
Hemoglobin: 11.9 — ABNORMAL LOW
Potassium: 3.8
Sodium: 139
TCO2: 21

## 2010-11-21 LAB — ACETAMINOPHEN LEVEL: Acetaminophen (Tylenol), Serum: <10 — ABNORMAL LOW

## 2010-11-21 LAB — AMMONIA: Ammonia: 15

## 2010-11-21 LAB — URINE CULTURE

## 2010-11-21 LAB — DIFFERENTIAL
Basophils Absolute: 0
Lymphocytes Relative: 29
Neutro Abs: 4.7
Neutrophils Relative %: 64

## 2010-11-21 LAB — ETHANOL: Alcohol, Ethyl (B): <5

## 2010-11-25 LAB — CBC
HCT: 30.8 — ABNORMAL LOW
Platelets: 355
WBC: 8.8

## 2010-11-25 LAB — COMPREHENSIVE METABOLIC PANEL
AST: 19
Albumin: 3.5
Alkaline Phosphatase: 93
BUN: 6
Chloride: 105
GFR calc Af Amer: >60
Potassium: 4.1
Total Bilirubin: 1

## 2010-11-25 LAB — URINALYSIS, ROUTINE W REFLEX MICROSCOPIC
Bilirubin Urine: NEGATIVE
Glucose, UA: NEGATIVE
Ketones, ur: NEGATIVE
pH: 6

## 2010-11-25 LAB — DIFFERENTIAL
Basophils Relative: 1
Eosinophils Absolute: 0.2
Monocytes Absolute: 0.4
Neutro Abs: 5.7

## 2010-12-11 LAB — BASIC METABOLIC PANEL
CO2: 25
GFR calc Af Amer: >60
GFR calc non Af Amer: >60
Glucose, Bld: 119 — ABNORMAL HIGH
Potassium: 3.5

## 2010-12-11 LAB — URINALYSIS, ROUTINE W REFLEX MICROSCOPIC
Glucose, UA: NEGATIVE
Leukocytes, UA: NEGATIVE
Nitrite: NEGATIVE
Specific Gravity, Urine: 1.031 — ABNORMAL HIGH
pH: 6

## 2010-12-11 LAB — DIFFERENTIAL
Basophils Absolute: 0.1
Eosinophils Relative: 3
Lymphocytes Relative: 26
Monocytes Absolute: 0.4

## 2010-12-11 LAB — RAPID URINE DRUG SCREEN, HOSP PERFORMED
Amphetamines: NOT DETECTED
Cocaine: POSITIVE — AB
Opiates: NOT DETECTED
Tetrahydrocannabinol: POSITIVE — AB

## 2010-12-11 LAB — URINE MICROSCOPIC-ADD ON

## 2010-12-11 LAB — CBC
HCT: 35 — ABNORMAL LOW
Hemoglobin: 11.1 — ABNORMAL LOW
RDW: 15 — ABNORMAL HIGH

## 2010-12-11 LAB — ETHANOL: Alcohol, Ethyl (B): <5

## 2010-12-11 LAB — PREGNANCY, URINE: Preg Test, Ur: NEGATIVE

## 2011-06-07 ENCOUNTER — Emergency Department (HOSPITAL_COMMUNITY)
Admission: EM | Admit: 2011-06-07 | Discharge: 2011-06-07 | Disposition: A | Discharge status: Home / Self Care | Payer: No Typology Code available for payment source | Attending: Emergency Medicine | Admitting: Emergency Medicine

## 2011-06-07 ENCOUNTER — Emergency Department (HOSPITAL_COMMUNITY): Payer: No Typology Code available for payment source

## 2011-06-07 ENCOUNTER — Encounter (HOSPITAL_COMMUNITY): Payer: Self-pay | Admitting: *Deleted

## 2011-06-07 DIAGNOSIS — S39012A Strain of muscle, fascia and tendon of lower back, initial encounter: Secondary | ICD-10-CM

## 2011-06-07 DIAGNOSIS — S8000XA Contusion of unspecified knee, initial encounter: Secondary | ICD-10-CM | POA: Insufficient documentation

## 2011-06-07 DIAGNOSIS — N938 Other specified abnormal uterine and vaginal bleeding: Secondary | ICD-10-CM | POA: Insufficient documentation

## 2011-06-07 DIAGNOSIS — Y92410 Unspecified street and highway as the place of occurrence of the external cause: Secondary | ICD-10-CM | POA: Insufficient documentation

## 2011-06-07 DIAGNOSIS — M25569 Pain in unspecified knee: Secondary | ICD-10-CM | POA: Insufficient documentation

## 2011-06-07 DIAGNOSIS — M545 Low back pain, unspecified: Secondary | ICD-10-CM | POA: Insufficient documentation

## 2011-06-07 DIAGNOSIS — N949 Unspecified condition associated with female genital organs and menstrual cycle: Secondary | ICD-10-CM | POA: Insufficient documentation

## 2011-06-07 DIAGNOSIS — S335XXA Sprain of ligaments of lumbar spine, initial encounter: Secondary | ICD-10-CM | POA: Insufficient documentation

## 2011-06-07 DIAGNOSIS — IMO0001 Reserved for inherently not codable concepts without codable children: Secondary | ICD-10-CM | POA: Insufficient documentation

## 2011-06-07 DIAGNOSIS — S8002XA Contusion of left knee, initial encounter: Secondary | ICD-10-CM

## 2011-06-07 LAB — POCT I-STAT, CHEM 8
BUN: 10 mg/dL (ref 6–23)
Calcium, Ion: 1.12 mmol/L (ref 1.12–1.32)
Chloride: 106 meq/L (ref 96–112)
Creatinine, Ser: 0.7 mg/dL (ref 0.50–1.10)
Glucose, Bld: 84 mg/dL (ref 70–99)
HCT: 33 % — ABNORMAL LOW (ref 36.0–46.0)
Hemoglobin: 11.2 g/dL — ABNORMAL LOW (ref 12.0–15.0)
Potassium: 3.9 meq/L (ref 3.5–5.1)
Sodium: 141 meq/L (ref 135–145)
TCO2: 24 mmol/L (ref 0–100)

## 2011-06-07 LAB — CBC
HCT: 31.8 % — ABNORMAL LOW (ref 36.0–46.0)
MCH: 18.3 pg — ABNORMAL LOW (ref 26.0–34.0)
MCHC: 31.1 g/dL (ref 30.0–36.0)
MCV: 58.8 fL — ABNORMAL LOW (ref 78.0–100.0)
RDW: 16.3 % — ABNORMAL HIGH (ref 11.5–15.5)

## 2011-06-07 LAB — DIFFERENTIAL
Basophils Absolute: 0.1 10*3/uL (ref 0.0–0.1)
Basophils Relative: 1 % (ref 0–1)
Eosinophils Absolute: 0.2 10*3/uL (ref 0.0–0.7)
Eosinophils Relative: 2 % (ref 0–5)
Monocytes Absolute: 0.4 10*3/uL (ref 0.1–1.0)

## 2011-06-07 MED ORDER — OXYCODONE-ACETAMINOPHEN 5-325 MG PO TABS: 1.0000 | ORAL_TABLET | ORAL | Status: AC | PRN

## 2011-06-07 MED ORDER — HYDROMORPHONE HCL PF 1 MG/ML IJ SOLN
1.0000 mg | Freq: Once | INTRAMUSCULAR | Status: AC
  Administered 2011-06-07: 1 mg via INTRAMUSCULAR
  Filled 2011-06-07: qty 1

## 2011-06-07 MED ORDER — ONDANSETRON HCL 4 MG/2ML IJ SOLN
4.0000 mg | Freq: Once | INTRAMUSCULAR | Status: AC
  Administered 2011-06-07: 4 mg via INTRAMUSCULAR
  Filled 2011-06-07: qty 2

## 2011-06-07 NOTE — ED Notes (Signed)
PT with multiple complaints. States she was in [facility] on Tuesday and now has sharp pain down left side including neck, arm, lower back, hip, and leg. Also states that her MP started and she has larger clots than normal. Plus she is feeling anorexic

## 2011-06-07 NOTE — ED Notes (Signed)
Pt states understanding of discharge instructions 
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
## 2011-06-07 NOTE — ED Provider Notes (Signed)
Medical screening examination/treatment/procedure(s) were performed by non-physician practitioner and as supervising physician I was immediately available for consultation/collaboration.   Vida Roller, MD 06/07/11 [phone]

## 2011-06-07 NOTE — ED Notes (Signed)
She is also c/o a heavy period

## 2011-06-07 NOTE — Discharge Instructions (Signed)
Abnormal Uterine Bleeding Abnormal uterine bleeding can have many causes. Some cases are simply treated, while others are more serious. There are several kinds of bleeding that is considered abnormal, including:  Bleeding between periods.   Bleeding after sexual intercourse.   Spotting anytime in the menstrual cycle.   Bleeding heavier or more than normal.   Bleeding after menopause.  CAUSES  There are many causes of abnormal uterine bleeding. It can be present in teenagers, pregnant women, women during their reproductive years, and women who have reached menopause. Your caregiver will look for the more common causes depending on your age, signs, symptoms and your particular circumstance. Most cases are not serious and can be treated. Even the more serious causes, like cancer of the female organs, can be treated adequately if found in the early stages. That is why all types of bleeding should be evaluated and treated as soon as possible. DIAGNOSIS  Diagnosing the cause may take several kinds of tests. Your caregiver may:  Take a complete history of the type of bleeding.   Perform a complete physical exam and Pap smear.   Take an ultrasound on the abdomen showing a picture of the female organs and the pelvis.   Inject dye into the uterus and Fallopian tubes and X-ray them (hysterosalpingogram).   Place fluid in the uterus and do an ultrasound (sonohysterogrqphy).   Take a CT scan to examine the female organs and pelvis.   Take an MRI to examine the female organs and pelvis. There is no X-ray involved with this procedure.   Look inside the uterus with a telescope that has a light at the end (hysteroscopy).   Scrap the inside of the uterus to get tissue to examine (Dilatation and Curettage, D&C).   Look into the pelvis with a telescope that has a light at the end (laparoscopy). This is done through a very small cut (incision) in the abdomen.  TREATMENT  Treatment will depend on the  cause of the abnormal bleeding. It can include:  Doing nothing to allow the problem to take care of itself over time.   Hormone treatment.   Birth control pills.   Treating the medical condition causing the problem.   Laparoscopy.   Major or minor surgery   Destroying the lining of the uterus with electrical currant, laser, freezing or heat (uterine ablation).  HOME CARE INSTRUCTIONS   Follow your caregiver's recommendation on how to treat your problem.   See your caregiver if you missed a menstrual period and think you may be pregnant.   If you are bleeding heavily, count the number of pads/tampons you use and how often you have to change them. Tell this to your caregiver.   Avoid sexual intercourse until the problem is controlled.  SEEK MEDICAL CARE IF:   You have any kind of abnormal bleeding mentioned above.   You feel dizzy at times.   You are 41 years old and have not had a menstrual period yet.  SEEK IMMEDIATE MEDICAL CARE IF:   You pass out.   You are changing pads/tampons every 15 to 30 minutes.   You have belly (abdominal) pain.   You have a temperature of 100 F (37.8 C) or higher.   You become sweaty or weak.   You are passing large blood clots from the vagina.   You start to feel sick to your stomach (nauseous) and throw up (vomit).  Document Released: 02/09/2005 Document Revised: 01/29/2011 Document Reviewed: 07/05/2008 [employer]   Patient Information 2012 [place], [place].Menorrhagia Dysfunctional uterine bleeding is different from a normal menstrual period. When periods are heavy or there is more bleeding than is usual for you, it is called menorrhagia. It may be caused by hormonal imbalance, or physical, metabolic, or other problems. Examination is necessary in order that your caregiver may treat treatable causes. If this is a continuing problem, a D&C may be needed. That means that the cervix (the opening of the uterus or womb) is dilated (stretched  larger) and the lining of the uterus is scraped out. The tissue scraped out is then examined under a microscope by a specialist (pathologist) to make sure there is nothing of concern that needs further or more extensive treatment. HOME CARE INSTRUCTIONS   If medications were prescribed, take exactly as directed. Do not change or switch medications without consulting your caregiver.   Long term heavy bleeding may result in iron deficiency. Your caregiver may have prescribed iron pills. They help replace the iron your body lost from heavy bleeding. Take exactly as directed. Iron may cause constipation. If this becomes a problem, increase the bran, fruits, and roughage in your diet.   Do not take aspirin or medicines that contain aspirin one week before or during your menstrual period. Aspirin may make the bleeding worse.   If you need to change your sanitary pad or tampon more than once every 2 hours, stay in bed and rest as much as possible until the bleeding stops.   Eat well-balanced meals. Eat foods high in iron. Examples are leafy green vegetables, meat, liver, eggs, and whole grain breads and cereals. Do not try to lose weight until the abnormal bleeding has stopped and your blood iron level is back to normal.  SEEK MEDICAL CARE IF:   You need to change your sanitary pad or tampon more than once an hour.   You develop nausea (feeling sick to your stomach) and vomiting, dizziness, or diarrhea while you are taking your medicine.   You have any problems that may be related to the medicine you are taking.  SEEK IMMEDIATE MEDICAL CARE IF:   You have a fever.   You develop chills.   You develop severe bleeding or start to pass blood clots.   You feel dizzy or faint.  MAKE SURE YOU:   Understand these instructions.   Will watch your condition.   Will get help right away if you are not doing well or get worse.  Document Released: 02/09/2005 Document Revised: 01/29/2011 Document  Reviewed: 09/30/2007 [facility] Patient Information 2012 [place], [place].Sprains Sprains are painful injuries to joints as a result of partial or complete tearing of ligaments. HOME CARE INSTRUCTIONS   For the first 24 hours, keep the injured limb raised on 2 pillows while lying down.   Apply ice bags about every 2 hours for 20 to 30 minutes, while awake, to the injured area for the first 24 hours. Then apply as directed by your caregiver. Place the ice in a plastic bag with a towel around it to prevent frostbite to the skin.   Only take over-the-counter or prescription medicines for pain, discomfort, or fever as directed by your caregiver.   If an ace bandage (a stretchy, elastic wrapping bandage) has been applied today, remove and reapply every 3 to 4 hours. Apply firm enough to keep swelling down. Donot apply tightly. Watch fingers or toes for swelling, bluish discoloration, coldness, numbness, or excessive pain. If any of these problems (symptoms) occur, remove  the ace bandage and reapply it more loosely. Contact your caregiver or return to this location if these symptoms persist.  Persistent pain and inability to use the injured area for more than 2 to 3 days are warning signs. See a caregiver for a follow-up visit as soon as possible. A hairline fracture (broken bone) may not show on X-rays. Persistent pain and swelling indicate that further evaluation, use of crutches, and/or more X-rays are needed. X-rays may sometimes not show a small fracture until a week or ten days later. Make a follow-up appointment with your own caregiver or to whom we have referred you. A specialist in reading X-rays(radiologist) will re-read your X-rays. Make sure you know how to obtain your X-ray results. Do not assume everything is normal if you do not hear from [place]. SEEK IMMEDIATE MEDICAL CARE IF:  You develop severe pain or more swelling.   The pain is not controlled with medicine.   Your skin or nails below the  injury turn blue or grey or feel cold or numb.  Document Released: 02/07/2000 Document Revised: 01/29/2011 Document Reviewed: 09/26/2007 [facility] Patient Information 2012 [place], [place].

## 2011-06-07 NOTE — ED Notes (Signed)
The pt has had pain on her entire lt side of her body for one week

## 2011-06-26 ENCOUNTER — Emergency Department (HOSPITAL_COMMUNITY): Payer: Self-pay

## 2011-06-26 ENCOUNTER — Emergency Department (HOSPITAL_COMMUNITY)
Admission: EM | Admit: 2011-06-26 | Discharge: 2011-06-26 | Disposition: A | Discharge status: Home / Self Care | Payer: Self-pay | Attending: Emergency Medicine | Admitting: Emergency Medicine

## 2011-06-26 DIAGNOSIS — R609 Edema, unspecified: Secondary | ICD-10-CM | POA: Insufficient documentation

## 2011-06-26 DIAGNOSIS — M545 Low back pain, unspecified: Secondary | ICD-10-CM | POA: Insufficient documentation

## 2011-06-26 DIAGNOSIS — R079 Chest pain, unspecified: Secondary | ICD-10-CM | POA: Insufficient documentation

## 2011-06-26 DIAGNOSIS — M79609 Pain in unspecified limb: Secondary | ICD-10-CM | POA: Insufficient documentation

## 2011-06-26 DIAGNOSIS — M7989 Other specified soft tissue disorders: Secondary | ICD-10-CM | POA: Insufficient documentation

## 2011-06-26 DIAGNOSIS — M549 Dorsalgia, unspecified: Secondary | ICD-10-CM

## 2011-06-26 LAB — POCT I-STAT, CHEM 8
Chloride: 105 mEq/L (ref 96–112)
Creatinine, Ser: 0.7 mg/dL (ref 0.50–1.10)
Glucose, Bld: 93 mg/dL (ref 70–99)
HCT: 28 % — ABNORMAL LOW (ref 36.0–46.0)
Potassium: 4 mEq/L (ref 3.5–5.1)
Sodium: 137 mEq/L (ref 135–145)

## 2011-06-26 LAB — POCT I-STAT TROPONIN I: Troponin i, poc: 0.01 ng/mL (ref 0.00–0.08)

## 2011-06-26 MED ORDER — KETOROLAC TROMETHAMINE 60 MG/2ML IM SOLN
60.0000 mg | Freq: Once | INTRAMUSCULAR | Status: AC
  Administered 2011-06-26: 60 mg via INTRAMUSCULAR
  Filled 2011-06-26: qty 2

## 2011-06-26 MED ORDER — DEXAMETHASONE SODIUM PHOSPHATE 10 MG/ML IJ SOLN
10.0000 mg | Freq: Once | INTRAMUSCULAR | Status: AC
  Administered 2011-06-26: 10 mg via INTRAMUSCULAR
  Filled 2011-06-26: qty 1

## 2011-06-26 MED ORDER — HYDROMORPHONE HCL PF 2 MG/ML IJ SOLN
2.0000 mg | Freq: Once | INTRAMUSCULAR | Status: AC
  Administered 2011-06-26: 2 mg via INTRAMUSCULAR
  Filled 2011-06-26: qty 1

## 2011-06-26 MED ORDER — METHOCARBAMOL 500 MG PO TABS
500.0000 mg | ORAL_TABLET | Freq: Once | ORAL | Status: AC
  Administered 2011-06-26: 500 mg via ORAL
  Filled 2011-06-26: qty 1

## 2011-06-26 MED ORDER — OXYCODONE-ACETAMINOPHEN 5-325 MG PO TABS: 1.0000 | ORAL_TABLET | Freq: Three times a day (TID) | ORAL | Status: AC | PRN

## 2011-06-26 MED ORDER — METHOCARBAMOL 500 MG PO TABS: 500.0000 mg | ORAL_TABLET | Freq: Two times a day (BID) | ORAL | Status: AC

## 2011-06-26 NOTE — Discharge Instructions (Signed)
Please take Aleve for additional back pain relief. Please keep appointment with health service in 11 days. I have provided a referral for orthopedic physician if no improvement in symptoms.  Back Exercises Back exercises help treat and prevent back injuries. The goal of back exercises is to increase the strength of your abdominal and back muscles and the flexibility of your back. These exercises should be started when you no longer have back pain. Back exercises include:  Pelvic Tilt. Lie on your back with your knees bent. Tilt your pelvis until the lower part of your back is against the floor. Hold this position 5 to 10 sec and repeat 5 to 10 times.   Knee to Chest. Pull first 1 knee up against your chest and hold for 20 to 30 seconds, repeat this with the other knee, and then both knees. This may be done with the other leg straight or bent, whichever feels better.   Sit-Ups or Curl-Ups. Bend your knees 90 degrees. Start with tilting your pelvis, and do a partial, slow sit-up, lifting your trunk only 30 to 45 degrees off the floor. Take at least 2 to 3 seconds for each sit-up. Do not do sit-ups with your knees out straight. If partial sit-ups are difficult, simply do the above but with only tightening your abdominal muscles and holding it as directed.   Hip-Lift. Lie on your back with your knees flexed 90 degrees. Push down with your feet and shoulders as you raise your hips a couple inches off the floor; hold for 10 seconds, repeat 5 to 10 times.   Back arches. Lie on your stomach, propping yourself up on bent elbows. Slowly press on your hands, causing an arch in your low back. Repeat 3 to 5 times. Any initial stiffness and discomfort should lessen with repetition over time.   Shoulder-Lifts. Lie face down with arms beside your body. Keep hips and torso pressed to floor as you slowly lift your head and shoulders off the floor.  Do not overdo your exercises, especially in the beginning. Exercises  may cause you some mild back discomfort which lasts for a few minutes; however, if the pain is more severe, or lasts for more than 15 minutes, do not continue exercises until you see your caregiver. Improvement with exercise therapy for back problems is slow.  See your caregivers for assistance with developing a proper back exercise program. Document Released: 03/19/2004 Document Revised: 01/29/2011 Document Reviewed: 02/09/2005 [facility] Patient Information 2012 [place], [place].Back Pain, Adult Back pain is very common. The pain often gets better over time. The cause of back pain is usually not dangerous. Most people can learn to manage their back pain on their own.  HOME CARE   Stay active. Start with short walks on flat ground if you can. Try to walk farther each day.   Do not sit, drive, or stand in one place for more than 30 minutes. Do not stay in bed.   Do not avoid exercise or work. Activity can help your back heal faster.   Be careful when you bend or lift an object. Bend at your knees, keep the object close to you, and do not twist.   Sleep on a firm mattress. Lie on your side, and bend your knees. If you lie on your back, put a pillow under your knees.   Only take medicines as told by your doctor.   Put ice on the injured area.   Put ice in a plastic bag.  Place a towel between your skin and the bag.   Leave the ice on for 15 to 20 minutes, 3 to 4 times a day for the first 2 to 3 days. After that, you can switch between ice and heat packs.   Ask your doctor about back exercises or massage.   Avoid feeling anxious or stressed. Find good ways to deal with stress, such as exercise.  GET HELP RIGHT AWAY IF:   Your pain does not go away with rest or medicine.   Your pain does not go away in 1 week.   You have new problems.   You do not feel well.   The pain spreads into your legs.   You cannot control when you poop (bowel movement) or pee (urinate).   Your arms or  legs feel weak or lose feeling (numbness).   You feel sick to your stomach (nauseous) or throw up (vomit).   You have belly (abdominal) pain.   You feel like you may pass out (faint).  MAKE SURE YOU:   Understand these instructions.   Will watch your condition.   Will get help right away if you are not doing well or get worse.  Document Released: 07/29/2007 Document Revised: 01/29/2011 Document Reviewed: 06/30/2010 [facility] Patient Information 2012 [place], [place].Chest Pain (Nonspecific) It is often hard to give a specific diagnosis for the cause of chest pain. There is always a chance that your pain could be related to something serious, such as a heart attack or a blood clot in the lungs. You need to follow up with your caregiver for further evaluation. CAUSES   Heartburn.   Pneumonia or bronchitis.   Anxiety or stress.   Inflammation around your heart (pericarditis) or lung (pleuritis or pleurisy).   A blood clot in the lung.   A collapsed lung (pneumothorax). It can develop suddenly on its own (spontaneous pneumothorax) or from injury (trauma) to the chest.   Shingles infection (herpes zoster virus).  The chest wall is composed of bones, muscles, and cartilage. Any of these can be the source of the pain.  The bones can be bruised by injury.   The muscles or cartilage can be strained by coughing or overwork.   The cartilage can be affected by inflammation and become sore (costochondritis).  DIAGNOSIS  Lab tests or other studies, such as X-rays, electrocardiography, stress testing, or cardiac imaging, may be needed to find the cause of your pain.  TREATMENT   Treatment depends on what may be causing your chest pain. Treatment may include:   Acid blockers for heartburn.   Anti-inflammatory medicine.   Pain medicine for inflammatory conditions.   Antibiotics if an infection is present.   You may be advised to change lifestyle habits. This includes stopping  smoking and avoiding alcohol, caffeine, and chocolate.   You may be advised to keep your head raised (elevated) when sleeping. This reduces the chance of acid going backward from your stomach into your esophagus.   Most of the time, nonspecific chest pain will improve within 2 to 3 days with rest and mild pain medicine.  HOME CARE INSTRUCTIONS   If antibiotics were prescribed, take your antibiotics as directed. Finish them even if you start to feel better.   For the next few days, avoid physical activities that bring on chest pain. Continue physical activities as directed.   Do not smoke.   Avoid drinking alcohol.   Only take over-the-counter or prescription medicine for pain, discomfort,  or fever as directed by your caregiver.   Follow your caregiver's suggestions for further testing if your chest pain does not go away.   Keep any follow-up appointments you made. If you do not go to an appointment, you could develop lasting (chronic) problems with pain. If there is any problem keeping an appointment, you must call to reschedule.  SEEK MEDICAL CARE IF:   You think you are having problems from the medicine you are taking. Read your medicine instructions carefully.   Your chest pain does not go away, even after treatment.   You develop a rash with blisters on your chest.  SEEK IMMEDIATE MEDICAL CARE IF:   You have increased chest pain or pain that spreads to your arm, neck, jaw, back, or abdomen.   You develop shortness of breath, an increasing cough, or you are coughing up blood.   You have severe back or abdominal pain, feel nauseous, or vomit.   You develop severe weakness, fainting, or chills.   You have a fever.  THIS IS AN EMERGENCY. Do not wait to see if the pain will go away. Get medical help at once. Call your local emergency services (911 in [place] Do not drive yourself to the hospital. MAKE SURE YOU:   Understand these instructions.   Will watch your condition.    Will get help right away if you are not doing well or get worse.  Document Released: 11/19/2004 Document Revised: 01/29/2011 Document Reviewed: 09/15/2007 [facility] Patient Information 2012 [place], [place].
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
## 2011-06-26 NOTE — ED Notes (Signed)
PA to bedside

## 2011-06-26 NOTE — ED Notes (Signed)
Pt resting in stretcher in NAD, respirations even and unlabored. 

## 2011-06-27 NOTE — ED Provider Notes (Signed)
Medical screening examination/treatment/procedure(s) were performed by non-physician practitioner and as supervising physician I was immediately available for consultation/collaboration.  Mansur Patti R. Eugenia Eldredge, MD 06/27/11 2339 

## 2011-11-29 ENCOUNTER — Encounter (HOSPITAL_COMMUNITY): Payer: Self-pay

## 2011-11-29 ENCOUNTER — Emergency Department (INDEPENDENT_AMBULATORY_CARE_PROVIDER_SITE_OTHER)
Admission: EM | Admit: 2011-11-29 | Discharge: 2011-11-29 | Disposition: A | Discharge status: Home / Self Care | Payer: Self-pay | Source: Home / Self Care | Attending: Emergency Medicine | Admitting: Emergency Medicine

## 2011-11-29 DIAGNOSIS — I10 Essential (primary) hypertension: Secondary | ICD-10-CM

## 2011-11-29 DIAGNOSIS — M129 Arthropathy, unspecified: Secondary | ICD-10-CM

## 2011-11-29 DIAGNOSIS — D649 Anemia, unspecified: Secondary | ICD-10-CM

## 2011-11-29 DIAGNOSIS — M1990 Unspecified osteoarthritis, unspecified site: Secondary | ICD-10-CM

## 2011-11-29 HISTORY — DX: Unspecified osteoarthritis, unspecified site: M19.90

## 2011-11-29 HISTORY — DX: Essential (primary) hypertension: I10

## 2011-11-29 LAB — POCT I-STAT, CHEM 8
Chloride: 103 mEq/L (ref 96–112)
Creatinine, Ser: 0.9 mg/dL (ref 0.50–1.10)
Glucose, Bld: 102 mg/dL — ABNORMAL HIGH (ref 70–99)
HCT: 32 % — ABNORMAL LOW (ref 36.0–46.0)
Potassium: 3.7 mEq/L (ref 3.5–5.1)

## 2011-11-29 LAB — POCT URINALYSIS DIP (DEVICE): Ketones, ur: NEGATIVE mg/dL

## 2011-11-29 MED ORDER — LISINOPRIL 2.5 MG PO TABS: 2.5000 mg | ORAL_TABLET | Freq: Every day | ORAL | Status: DC

## 2011-11-29 MED ORDER — KETOROLAC TROMETHAMINE 60 MG/2ML IM SOLN
INTRAMUSCULAR | Status: AC
  Filled 2011-11-29: qty 2

## 2011-11-29 MED ORDER — METOPROLOL SUCCINATE ER 50 MG PO TB24: 50.0000 mg | ORAL_TABLET | Freq: Every day | ORAL | Status: DC

## 2011-11-29 MED ORDER — FERROUS FUMARATE 150 MG PO TABS: 1.0000 | ORAL_TABLET | Freq: Two times a day (BID) | ORAL | Status: DC

## 2011-11-29 MED ORDER — TRAMADOL HCL 50 MG PO TABS: 50.0000 mg | ORAL_TABLET | Freq: Four times a day (QID) | ORAL | Status: DC | PRN

## 2011-11-29 MED ORDER — OXYCODONE-ACETAMINOPHEN 5-325 MG PO TABS: 2.0000 | ORAL_TABLET | ORAL | Status: DC | PRN

## 2011-11-29 MED ORDER — CLONIDINE HCL 0.3 MG PO TABS: 0.3000 mg | ORAL_TABLET | Freq: Three times a day (TID) | ORAL | Status: DC

## 2011-11-29 MED ORDER — KETOROLAC TROMETHAMINE 60 MG/2ML IM SOLN
60.0000 mg | Freq: Once | INTRAMUSCULAR | Status: AC
  Administered 2011-11-29: 60 mg via INTRAMUSCULAR

## 2011-11-29 NOTE — ED Provider Notes (Signed)
History     CSN: [id number]  Arrival date & time 11/29/11  1402   None     Chief Complaint  Patient presents with  . Hypertension    (Consider location/radiation/quality/duration/timing/severity/associated sxs/prior treatment) The history is provided by the patient.  Kelly Novak is a 41 y.o. female who would like a refill of hypertensive medications.  She will run out of her medication tomorrow, states she took one of her brothers hypertensive medications in place of hers yesterday.  Primary care provider was at [facility].  Unable to get an appointment related to facility closing.  She has attempted to transfer to another office but unable to get an appointment with a primary care provider.     Past Medical History  Diagnosis Date  . Arthritis   . Hypertension     History reviewed. No pertinent past surgical history.  No family history on file.  History  Substance Use Topics  . Smoking status: Not on file  . Smokeless tobacco: Not on file  . Alcohol Use:     OB History    Grav Para Term Preterm Abortions TAB SAB Ect Mult Living                  Review of Systems  Constitutional: Negative.   Respiratory: Negative.   Cardiovascular: Negative.   Gastrointestinal: Negative.   Neurological: Negative.     Allergies  Review of patient's allergies indicates no known allergies.  Home Medications   Current Outpatient Rx  Name Route Sig Dispense Refill  . LAMOTRIGINE 25 MG PO TABS Oral Take 50 mg by mouth daily.     Marland Kitchen CLONIDINE HCL 0.3 MG PO TABS Oral Take 1 tablet (0.3 mg total) by mouth 3 (three) times daily. 90 tablet 1  . FERROUS FUMARATE 150 MG PO TABS Oral Take 1 tablet (150 mg total) by mouth 2 (two) times daily. 60 each 2  . LISINOPRIL 2.5 MG PO TABS Oral Take 1 tablet (2.5 mg total) by mouth daily. 30 tablet 1  . METOPROLOL SUCCINATE ER 50 MG PO TB24 Oral Take 1 tablet (50 mg total) by mouth daily. Take with or immediately following a meal. 30 tablet 1    . OXYCODONE-ACETAMINOPHEN 5-325 MG PO TABS Oral Take 2 tablets by mouth every 4 (four) hours as needed. For back pain 60 tablet 0  . TRAMADOL HCL 50 MG PO TABS Oral Take 1 tablet (50 mg total) by mouth every 6 (six) hours as needed. For pain 45 tablet 1    BP 113/68  Pulse 72  Temp 98.6 F (37 C) (Oral)  Resp 18  SpO2 100%  LMP 11/27/2011  Physical Exam  Nursing note and vitals reviewed. Constitutional: She is oriented to person, place, and time. Vital signs are normal. She appears well-developed and well-nourished. She is active and cooperative.  HENT:  Head: Normocephalic.  Eyes: Conjunctivae normal are normal. Pupils are equal, round, and reactive to light. No scleral icterus.  Neck: Trachea normal. Neck supple. No thyromegaly present.  Cardiovascular: Normal rate, regular rhythm, normal heart sounds and intact distal pulses.   No murmur heard. Pulmonary/Chest: Effort normal and breath sounds normal.  Abdominal: Soft. Bowel sounds are normal. There is no tenderness.  Musculoskeletal: Normal range of motion.  Lymphadenopathy:    She has no cervical adenopathy.  Neurological: She is alert and oriented to person, place, and time. No cranial nerve deficit or sensory deficit.  Skin: Skin is warm and  dry.  Psychiatric: She has a normal mood and affect. Her speech is normal and behavior is normal. Judgment and thought content normal. Cognition and memory are normal.    ED Course  Procedures (including critical care time)  Labs Reviewed  POCT URINALYSIS DIP (DEVICE) - Abnormal; Notable for the following:    Leukocytes, UA TRACE (*)  Biochemical Testing Only. Please order routine urinalysis from main lab if confirmatory testing is needed.   All other components within normal limits  POCT I-STAT, CHEM 8 - Abnormal; Notable for the following:    Glucose, Bld 102 (*)     Hemoglobin 10.9 (*)     HCT 32.0 (*)     All other components within normal limits   No results found.   1.  Hypertension   2. Arthritis   3. Anemia       MDM  Patient reports she is taking an OTC iron supplement related increased menses vaginal bleeding as result of fibroids, will substitute for increased iron, instructed to take colace as needed.  Follow up with gyn provider.  Medication refill for daily medications x 2 months.  Call [facility] tomorrow so that they may assist you in obtaining an appointment sooner.  RTC as needed.          Johnsie Kindred, NP 11/29/11 1621

## 2011-11-29 NOTE — ED Notes (Signed)
Patient needs refills on medications, previous [facility] patient and she will be out of BP medications tomorrow, would also like something for her arthritis pain

## 2011-11-30 NOTE — ED Provider Notes (Signed)
Medical screening examination/treatment/procedure(s) were performed by non-physician practitioner and as supervising physician I was immediately available for consultation/collaboration.  Leslee Home, M.D.   Reuben Likes, MD 11/30/11 [phone]

## 2011-12-09 ENCOUNTER — Emergency Department (HOSPITAL_COMMUNITY): Payer: Self-pay

## 2011-12-09 ENCOUNTER — Emergency Department (HOSPITAL_COMMUNITY)
Admission: EM | Admit: 2011-12-09 | Discharge: 2011-12-09 | Disposition: A | Discharge status: Home / Self Care | Payer: No Typology Code available for payment source | Attending: Emergency Medicine | Admitting: Emergency Medicine

## 2011-12-09 ENCOUNTER — Encounter (HOSPITAL_COMMUNITY): Payer: Self-pay | Admitting: Emergency Medicine

## 2011-12-09 DIAGNOSIS — M545 Low back pain, unspecified: Secondary | ICD-10-CM | POA: Insufficient documentation

## 2011-12-09 DIAGNOSIS — M25519 Pain in unspecified shoulder: Secondary | ICD-10-CM | POA: Insufficient documentation

## 2011-12-09 DIAGNOSIS — S301XXA Contusion of abdominal wall, initial encounter: Secondary | ICD-10-CM | POA: Insufficient documentation

## 2011-12-09 DIAGNOSIS — I10 Essential (primary) hypertension: Secondary | ICD-10-CM | POA: Insufficient documentation

## 2011-12-09 DIAGNOSIS — M25559 Pain in unspecified hip: Secondary | ICD-10-CM | POA: Insufficient documentation

## 2011-12-09 DIAGNOSIS — R109 Unspecified abdominal pain: Secondary | ICD-10-CM | POA: Insufficient documentation

## 2011-12-09 DIAGNOSIS — T1490XA Injury, unspecified, initial encounter: Secondary | ICD-10-CM | POA: Insufficient documentation

## 2011-12-09 LAB — URINALYSIS, ROUTINE W REFLEX MICROSCOPIC
Bilirubin Urine: NEGATIVE
Hgb urine dipstick: NEGATIVE
Nitrite: NEGATIVE
Specific Gravity, Urine: 1.01 (ref 1.005–1.030)
pH: 6.5 (ref 5.0–8.0)

## 2011-12-09 LAB — POCT I-STAT, CHEM 8
Chloride: 102 mEq/L (ref 96–112)
Glucose, Bld: 94 mg/dL (ref 70–99)
HCT: 34 % — ABNORMAL LOW (ref 36.0–46.0)
Potassium: 3.7 mEq/L (ref 3.5–5.1)

## 2011-12-09 MED ORDER — OXYCODONE-ACETAMINOPHEN 7.5-325 MG PO TABS: 1.0000 | ORAL_TABLET | ORAL | Status: DC | PRN

## 2011-12-09 MED ORDER — FENTANYL CITRATE 0.05 MG/ML IJ SOLN
100.0000 ug | Freq: Once | INTRAMUSCULAR | Status: AC
  Administered 2011-12-09: 100 ug via INTRAVENOUS
  Filled 2011-12-09: qty 2

## 2011-12-09 MED ORDER — KETOROLAC TROMETHAMINE 30 MG/ML IJ SOLN
INTRAMUSCULAR | Status: AC
  Administered 2011-12-09: 60 mg
  Filled 2011-12-09: qty 2

## 2011-12-09 MED ORDER — ONDANSETRON HCL 4 MG/2ML IJ SOLN
4.0000 mg | Freq: Once | INTRAMUSCULAR | Status: AC
  Administered 2011-12-09: 4 mg via INTRAVENOUS
  Filled 2011-12-09: qty 2

## 2011-12-09 MED ORDER — IOHEXOL 300 MG/ML  SOLN
100.0000 mL | Freq: Once | INTRAMUSCULAR | Status: AC | PRN
  Administered 2011-12-09: 100 mL via INTRAVENOUS

## 2011-12-09 MED ORDER — KETOROLAC TROMETHAMINE 60 MG/2ML IM SOLN: 60.0000 mg | Freq: Once | INTRAMUSCULAR | Status: AC

## 2011-12-09 NOTE — ED Notes (Signed)
Sheriff deputy in room to speak with patient
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
## 2012-02-08 ENCOUNTER — Encounter (HOSPITAL_COMMUNITY): Payer: Self-pay | Admitting: Emergency Medicine

## 2012-02-08 DIAGNOSIS — R209 Unspecified disturbances of skin sensation: Secondary | ICD-10-CM | POA: Insufficient documentation

## 2012-02-08 DIAGNOSIS — J029 Acute pharyngitis, unspecified: Secondary | ICD-10-CM | POA: Insufficient documentation

## 2012-02-08 DIAGNOSIS — M542 Cervicalgia: Secondary | ICD-10-CM | POA: Insufficient documentation

## 2012-02-08 DIAGNOSIS — F17200 Nicotine dependence, unspecified, uncomplicated: Secondary | ICD-10-CM | POA: Insufficient documentation

## 2012-02-08 DIAGNOSIS — IMO0002 Reserved for concepts with insufficient information to code with codable children: Secondary | ICD-10-CM | POA: Insufficient documentation

## 2012-02-08 DIAGNOSIS — Z8739 Personal history of other diseases of the musculoskeletal system and connective tissue: Secondary | ICD-10-CM | POA: Insufficient documentation

## 2012-02-08 DIAGNOSIS — IMO0001 Reserved for inherently not codable concepts without codable children: Secondary | ICD-10-CM | POA: Insufficient documentation

## 2012-02-08 DIAGNOSIS — I10 Essential (primary) hypertension: Secondary | ICD-10-CM | POA: Insufficient documentation

## 2012-02-08 DIAGNOSIS — Z79899 Other long term (current) drug therapy: Secondary | ICD-10-CM | POA: Insufficient documentation

## 2012-02-08 DIAGNOSIS — M545 Low back pain, unspecified: Secondary | ICD-10-CM | POA: Insufficient documentation

## 2012-02-08 DIAGNOSIS — R509 Fever, unspecified: Secondary | ICD-10-CM | POA: Insufficient documentation
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
## 2012-02-09 ENCOUNTER — Emergency Department (HOSPITAL_COMMUNITY): Payer: Self-pay

## 2012-02-09 ENCOUNTER — Emergency Department (HOSPITAL_COMMUNITY)
Admission: EM | Admit: 2012-02-09 | Discharge: 2012-02-09 | Disposition: A | Discharge status: Home / Self Care | Payer: Self-pay | Attending: Emergency Medicine | Admitting: Emergency Medicine

## 2012-02-09 DIAGNOSIS — R200 Anesthesia of skin: Secondary | ICD-10-CM

## 2012-02-09 DIAGNOSIS — M545 Low back pain: Secondary | ICD-10-CM

## 2012-02-09 DIAGNOSIS — M791 Myalgia, unspecified site: Secondary | ICD-10-CM

## 2012-02-09 LAB — URINE MICROSCOPIC-ADD ON

## 2012-02-09 LAB — POCT I-STAT, CHEM 8
BUN: 14 mg/dL (ref 6–23)
Creatinine, Ser: 0.8 mg/dL (ref 0.50–1.10)
Glucose, Bld: 77 mg/dL (ref 70–99)
Sodium: 143 mEq/L (ref 135–145)
TCO2: 27 mmol/L (ref 0–100)

## 2012-02-09 LAB — URINALYSIS, ROUTINE W REFLEX MICROSCOPIC
Bilirubin Urine: NEGATIVE
Hgb urine dipstick: NEGATIVE
Ketones, ur: 15 mg/dL — AB
Protein, ur: 30 mg/dL — AB
Urobilinogen, UA: 0.2 mg/dL (ref 0.0–1.0)

## 2012-02-09 LAB — CBC
HCT: 32.7 % — ABNORMAL LOW (ref 36.0–46.0)
MCH: 18.8 pg — ABNORMAL LOW (ref 26.0–34.0)
MCHC: 30.6 g/dL (ref 30.0–36.0)
MCV: 61.6 fL — ABNORMAL LOW (ref 78.0–100.0)
Platelets: 293 10*3/uL (ref 150–400)
RDW: 16.6 % — ABNORMAL HIGH (ref 11.5–15.5)

## 2012-02-09 MED ORDER — PREDNISONE 20 MG PO TABS: ORAL_TABLET | ORAL | Status: DC

## 2012-02-09 MED ORDER — HYDROCODONE-ACETAMINOPHEN 5-325 MG PO TABS: 1.0000 | ORAL_TABLET | ORAL | Status: DC | PRN

## 2012-02-09 MED ORDER — MORPHINE SULFATE 4 MG/ML IJ SOLN
4.0000 mg | Freq: Once | INTRAMUSCULAR | Status: AC
  Administered 2012-02-09: 4 mg via INTRAVENOUS
  Filled 2012-02-09: qty 1

## 2012-02-09 MED ORDER — SODIUM CHLORIDE 0.9 % IV SOLN
Freq: Once | INTRAVENOUS | Status: AC
  Administered 2012-02-09: 20 mL/h via INTRAVENOUS

## 2012-02-09 MED ORDER — PREDNISONE 20 MG PO TABS
60.0000 mg | ORAL_TABLET | Freq: Once | ORAL | Status: AC
  Administered 2012-02-09: 60 mg via ORAL
  Filled 2012-02-09: qty 3

## 2012-02-09 MED ORDER — ONDANSETRON HCL 4 MG/2ML IJ SOLN
4.0000 mg | Freq: Once | INTRAMUSCULAR | Status: AC
  Administered 2012-02-09: 4 mg via INTRAVENOUS
  Filled 2012-02-09: qty 2

## 2012-02-09 NOTE — ED Provider Notes (Signed)
  Medical screening examination/treatment/procedure(s) were performed by non-physician practitioner and as supervising physician I was immediately available for consultation/collaboration.    Vida Roller, MD 02/09/12 [phone]
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
## 2012-02-18 ENCOUNTER — Emergency Department (INDEPENDENT_AMBULATORY_CARE_PROVIDER_SITE_OTHER)
Admission: EM | Admit: 2012-02-18 | Discharge: 2012-02-18 | Disposition: A | Discharge status: Home / Self Care | Payer: Managed Care, Other (non HMO) | Source: Home / Self Care | Attending: Family Medicine | Admitting: Family Medicine

## 2012-02-18 ENCOUNTER — Encounter (HOSPITAL_COMMUNITY): Payer: Self-pay | Admitting: *Deleted

## 2012-02-18 DIAGNOSIS — M76899 Other specified enthesopathies of unspecified lower limb, excluding foot: Secondary | ICD-10-CM

## 2012-02-18 DIAGNOSIS — I10 Essential (primary) hypertension: Secondary | ICD-10-CM

## 2012-02-18 DIAGNOSIS — M7071 Other bursitis of hip, right hip: Secondary | ICD-10-CM

## 2012-02-18 HISTORY — DX: Other chronic pain: G89.29

## 2012-02-18 HISTORY — DX: Dorsalgia, unspecified: M54.9

## 2012-02-18 MED ORDER — CLONIDINE HCL 0.3 MG PO TABS: 0.3000 mg | ORAL_TABLET | Freq: Two times a day (BID) | ORAL | Status: DC

## 2012-02-18 MED ORDER — HYDROCHLOROTHIAZIDE 50 MG PO TABS: 50.0000 mg | ORAL_TABLET | Freq: Every day | ORAL | Status: DC

## 2012-02-18 MED ORDER — KETOROLAC TROMETHAMINE 10 MG PO TABS: 10.0000 mg | ORAL_TABLET | Freq: Four times a day (QID) | ORAL | Status: DC | PRN

## 2012-02-18 MED ORDER — TETANUS-DIPHTH-ACELL PERTUSSIS 5-2.5-18.5 LF-MCG/0.5 IM SUSP
INTRAMUSCULAR | Status: AC
  Filled 2012-02-18: qty 0.5

## 2012-02-18 NOTE — ED Notes (Signed)
Upon entering room to review Rxs and discharge instructions, patient asked why she was getting these particular HTN meds.  Explained to patient why she was being prescribed these meds - she has been without HTN meds for few months, needs a PCP to follow her to adjust meds/dosages prn.  Asked why she has a Rx for toradol and not tramadol - explained that the toradol will help with the pain and inflammation.  She then asked if it would help her sleep.  I stated it would not make her drowsy, but would help with the pain so she could be comfortable enough to sleep.  Patient stated she needs something to help her sleep.  Explained that we often do not prescribe narcotics for chronic pain issues, and that she's had this pain flare-up for over a month.  Resources offered to assist patient with obtaining a PCP ([facility] criteria info and dates provided, [facility] number provided).  Patient then said, "Get him back in here.  I want to talk to the doctor."  Dr. Artis Flock and I returned to room.  Patient again asked why he couldn't prescribe her the tramadol instead of toradol.  After Dr. Artis Flock explained that this may work better for her, patient then argued, "Well I need something to help me sleep."  Upon Dr. Artis Flock also explaining why he was giving her those particular prescriptions, she began arguing stating, "How much are you gonna charge me for this?!  What kind of Dr are you?  All you did was touch my back and hip?  You're too old to be a doctor.  How much it that gonna cost me?  You didn't do anything for me!".  Dr. Artis Flock explained that he reviewed her most recent XRs, and that there's nothing more we can do besides prescribe her medications to help, and asked the patient what more she thinks we could've done.  Patient continued to yell and curse in front of her children.  She also stated, "Well the pharmacy is closed.  Can't you give me a shot or somethin'?"  Informed that the pharmacies are indeed open, and gave her  the option of the 2 close 24-hr pharmacies.  She then yelled that she doesn't have the money for the prescriptions, and that she wanted a shot.  Patient continued to yell and berate the Dr, curse, stated that "the black doctor last time I was here gave me the Rx I wanted, maybe it's because she's black".  Made several derogatory remarks to both Dr. and myself, saying "You shut the hell up".  Patient told repeatedly that she needs to leave or we would have to call security.  Patient continued to curse and yell on her way out the clinical area.

## 2012-02-18 NOTE — ED Notes (Signed)
C/O flare-up of right low back pain over past month.  States pain radiating down into right foot.  Denies any parasthesias at this time.  Has been taking IBU.  States ran out of all of her meds at end of October.

## 2012-02-18 NOTE — ED Provider Notes (Signed)
History     CSN: [id number]  Arrival date & time 02/18/12  1628   First MD Initiated Contact with Patient 02/18/12 1749      No chief complaint on file.   (Consider location/radiation/quality/duration/timing/severity/associated sxs/prior treatment) Patient is a 41 y.o. female presenting with hip pain. The history is provided by the patient.  Hip Pain This is a chronic problem. The current episode started more than 1 week ago (seen in ER 12/17 and sx off and on from standing).    Past Medical History  Diagnosis Date  . Arthritis   . Hypertension     Past Surgical History  Procedure Date  . Cholecystectomy   . Cesarean section     No family history on file.  History  Substance Use Topics  . Smoking status: Current Every Day Smoker  . Smokeless tobacco: Not on file  . Alcohol Use: No    OB History    Grav Para Term Preterm Abortions TAB SAB Ect Mult Living                  Review of Systems  Constitutional: Negative.   Cardiovascular: Positive for leg swelling.  Musculoskeletal: Positive for back pain and gait problem.    Allergies  Review of patient's allergies indicates no known allergies.  Home Medications   Current Outpatient Rx  Name  Route  Sig  Dispense  Refill  . GOODY HEADACHE PO   Oral   Take 1 packet by mouth daily as needed. For pain         . CLONIDINE HCL 0.3 MG PO TABS   Oral   Take 1 tablet (0.3 mg total) by mouth 3 (three) times daily.   90 tablet   1   . CLONIDINE HCL 0.3 MG PO TABS   Oral   Take 1 tablet (0.3 mg total) by mouth 2 (two) times daily.   60 tablet   1   . FERROUS FUMARATE 150 MG PO TABS   Oral   Take 1 tablet (150 mg total) by mouth 2 (two) times daily.   60 each   2   . HYDROCHLOROTHIAZIDE 50 MG PO TABS   Oral   Take 1 tablet (50 mg total) by mouth daily.   30 tablet   1   . HYDROCODONE-ACETAMINOPHEN 5-325 MG PO TABS   Oral   Take 1 tablet by mouth every 4 (four) hours as needed for pain.   10  tablet   0   . KETOROLAC TROMETHAMINE 10 MG PO TABS   Oral   Take 1 tablet (10 mg total) by mouth every 6 (six) hours as needed for pain.   30 tablet   1   . LISINOPRIL 2.5 MG PO TABS   Oral   Take 1 tablet (2.5 mg total) by mouth daily.   30 tablet   1   . OVER THE COUNTER MEDICATION   Oral   Take 1 tablet by mouth every 4 (four) hours as needed. Tylenol Cold Plus For pain         . PREDNISONE 20 MG PO TABS      3 Tabs PO Days 1-3, then 2 tabs PO Days 4-6, then 1 tab PO Day 7-9, then Half Tab PO Day 10-12   20 tablet   0     BP 150/95  Pulse 76  Temp 98.1 F (36.7 C) (Oral)  Resp 16  SpO2 100%  LMP 02/02/2012  Physical Exam  Nursing note and vitals reviewed. Constitutional: She is oriented to person, place, and time. She appears well-developed and well-nourished.       Pt is beyond mobid obesity, impossible to adequately exam.  HENT:  Head: Normocephalic.  Musculoskeletal: She exhibits tenderness.  Neurological: She is alert and oriented to person, place, and time.  Skin: Skin is warm and dry.    ED Course  Procedures (including critical care time)  Labs Reviewed - No data to display No results found.   1. Bursitis of right hip   2. Hypertension       MDM          Linna Hoff, MD 02/18/12 [phone]

## 2012-02-18 NOTE — ED Notes (Signed)
Patient had no difficulty getting off exam table or walking out of department.

## 2012-02-21 ENCOUNTER — Emergency Department (HOSPITAL_BASED_OUTPATIENT_CLINIC_OR_DEPARTMENT_OTHER)
Admission: EM | Admit: 2012-02-21 | Discharge: 2012-02-21 | Disposition: A | Discharge status: Home / Self Care | Payer: Managed Care, Other (non HMO) | Attending: Emergency Medicine | Admitting: Emergency Medicine

## 2012-02-21 ENCOUNTER — Encounter (HOSPITAL_BASED_OUTPATIENT_CLINIC_OR_DEPARTMENT_OTHER): Payer: Self-pay | Admitting: *Deleted

## 2012-02-21 DIAGNOSIS — Z87891 Personal history of nicotine dependence: Secondary | ICD-10-CM | POA: Insufficient documentation

## 2012-02-21 DIAGNOSIS — M549 Dorsalgia, unspecified: Secondary | ICD-10-CM

## 2012-02-21 DIAGNOSIS — Z3202 Encounter for pregnancy test, result negative: Secondary | ICD-10-CM | POA: Insufficient documentation

## 2012-02-21 DIAGNOSIS — I10 Essential (primary) hypertension: Secondary | ICD-10-CM | POA: Insufficient documentation

## 2012-02-21 DIAGNOSIS — G8929 Other chronic pain: Secondary | ICD-10-CM | POA: Insufficient documentation

## 2012-02-21 DIAGNOSIS — Z79899 Other long term (current) drug therapy: Secondary | ICD-10-CM | POA: Insufficient documentation

## 2012-02-21 DIAGNOSIS — M545 Low back pain, unspecified: Secondary | ICD-10-CM | POA: Insufficient documentation

## 2012-02-21 DIAGNOSIS — Z8739 Personal history of other diseases of the musculoskeletal system and connective tissue: Secondary | ICD-10-CM | POA: Insufficient documentation

## 2012-02-21 LAB — URINALYSIS, ROUTINE W REFLEX MICROSCOPIC
Hgb urine dipstick: NEGATIVE
Nitrite: NEGATIVE
Specific Gravity, Urine: 1.023 (ref 1.005–1.030)
Urobilinogen, UA: 0.2 mg/dL (ref 0.0–1.0)
pH: 6 (ref 5.0–8.0)

## 2012-02-21 LAB — PREGNANCY, URINE: Preg Test, Ur: NEGATIVE

## 2012-02-21 MED ORDER — HYDROMORPHONE HCL PF 1 MG/ML IJ SOLN
1.0000 mg | Freq: Once | INTRAMUSCULAR | Status: AC
  Administered 2012-02-21: 1 mg via INTRAMUSCULAR
  Filled 2012-02-21: qty 1
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
## 2012-06-29 ENCOUNTER — Emergency Department (HOSPITAL_COMMUNITY)
Admission: EM | Admit: 2012-06-29 | Discharge: 2012-06-29 | Disposition: A | Discharge status: Home / Self Care | Payer: Self-pay | Source: Home / Self Care

## 2012-06-29 ENCOUNTER — Encounter (HOSPITAL_COMMUNITY): Payer: Self-pay

## 2012-06-29 DIAGNOSIS — I10 Essential (primary) hypertension: Secondary | ICD-10-CM

## 2012-06-29 MED ORDER — HYDROCODONE-ACETAMINOPHEN 5-325 MG PO TABS: 1.0000 | ORAL_TABLET | ORAL | Status: DC | PRN

## 2012-06-29 MED ORDER — FERROUS FUMARATE 150 MG PO TABS: 1.0000 | ORAL_TABLET | Freq: Two times a day (BID) | ORAL | Status: DC

## 2012-06-29 MED ORDER — GOODY HEADACHE PO: 1.0000 | Freq: Every day | ORAL | Status: DC | PRN

## 2012-06-29 MED ORDER — CLONIDINE HCL 0.3 MG PO TABS: 0.3000 mg | ORAL_TABLET | Freq: Two times a day (BID) | ORAL | Status: DC

## 2012-06-29 MED ORDER — LISINOPRIL 2.5 MG PO TABS: 2.5000 mg | ORAL_TABLET | Freq: Every day | ORAL | Status: DC

## 2012-06-29 MED ORDER — HYDROCHLOROTHIAZIDE 50 MG PO TABS: 50.0000 mg | ORAL_TABLET | Freq: Every day | ORAL | Status: DC

## 2012-06-29 MED ORDER — KETOROLAC TROMETHAMINE 10 MG PO TABS: 10.0000 mg | ORAL_TABLET | Freq: Four times a day (QID) | ORAL | Status: DC | PRN

## 2012-06-29 NOTE — ED Notes (Signed)
Patient is here to establish care Has history of hypertension Needs medication refill

## 2012-06-29 NOTE — ED Provider Notes (Signed)
History     CSN: [id number]  Arrival date & time 06/29/12  1043   First MD Initiated Contact with Patient 06/29/12 1056      Chief Complaint  Patient presents with  . Establish Care    (Consider location/radiation/quality/duration/timing/severity/associated sxs/prior treatment) HPI 42 year old female with past medical history significant for hypertension and arthritis presented to the clinic for followup and medication refills. Patient is doing well, reports being compliant with all her medications. No complaints of chest pain, cough, shortness of breath. No fevers or chills. No abdominal pain, nausea or vomiting.  Past Medical History  Diagnosis Date  . Arthritis   . Hypertension   . Chronic back pain     Past Surgical History  Procedure Laterality Date  . Cholecystectomy    . Cesarean section    . Tubal ligation      Family medical history significant for HTN, HLD  History  Substance Use Topics  . Smoking status: Former Games developer  . Smokeless tobacco: Not on file  . Alcohol Use: No    OB History   Grav Para Term Preterm Abortions TAB SAB Ect Mult Living                  Review of Systems  Constitutional: Negative for fever, chills, diaphoresis, activity change, appetite change and fatigue.  HENT: Negative for ear pain, nosebleeds, congestion, facial swelling, rhinorrhea, neck pain, neck stiffness and ear discharge.   Eyes: Negative for pain, discharge, redness, itching and visual disturbance.  Respiratory: Negative for cough, choking, chest tightness, shortness of breath, wheezing and stridor.   Cardiovascular: Negative for chest pain, palpitations and leg swelling.  Gastrointestinal: Negative for abdominal distention.  Genitourinary: Negative for dysuria, urgency, frequency, hematuria, flank pain, decreased urine volume, difficulty urinating and dyspareunia.  Musculoskeletal: Negative for back pain, joint swelling, arthralgias and gait problem.  Neurological:  Negative for dizziness, tremors, seizures, syncope, facial asymmetry, speech difficulty, weakness, light-headedness, numbness and headaches.  Hematological: Negative for adenopathy. Does not bruise/bleed easily.  Psychiatric/Behavioral: Negative for hallucinations, behavioral problems, confusion, dysphoric mood, decreased concentration and agitation.    Allergies  Review of patient's allergies indicates no known allergies.  Home Medications   Current Outpatient Rx  Name  Route  Sig  Dispense  Refill  . Aspirin-Acetaminophen-Caffeine (GOODY HEADACHE PO)   Oral   Take 1 packet by mouth daily as needed. For pain         . cloNIDine (CATAPRES) 0.3 MG tablet   Oral   Take 1 tablet (0.3 mg total) by mouth 3 (three) times daily.   90 tablet   1   . cloNIDine (CATAPRES) 0.3 MG tablet   Oral   Take 1 tablet (0.3 mg total) by mouth 2 (two) times daily.   60 tablet   1   . Ferrous Fumarate 150 MG TABS   Oral   Take 1 tablet (150 mg total) by mouth 2 (two) times daily.   60 each   2   . hydrochlorothiazide (HYDRODIURIL) 50 MG tablet   Oral   Take 1 tablet (50 mg total) by mouth daily.   30 tablet   1   . HYDROcodone-acetaminophen (NORCO/VICODIN) 5-325 MG per tablet   Oral   Take 1 tablet by mouth every 4 (four) hours as needed for pain.   10 tablet   0   . ketorolac (TORADOL) 10 MG tablet   Oral   Take 1 tablet (10 mg total) by mouth every  6 (six) hours as needed for pain.   30 tablet   1   . lisinopril (PRINIVIL,ZESTRIL) 2.5 MG tablet   Oral   Take 1 tablet (2.5 mg total) by mouth daily.   30 tablet   1   . OVER THE COUNTER MEDICATION   Oral   Take 1 tablet by mouth every 4 (four) hours as needed. Tylenol Cold Plus For pain         . predniSONE (DELTASONE) 20 MG tablet      3 Tabs PO Days 1-3, then 2 tabs PO Days 4-6, then 1 tab PO Day 7-9, then Half Tab PO Day 10-12   20 tablet   0     There were no vitals taken for this visit.  Physical  Exam  Constitutional: Appears well-developed and well-nourished. No distress.  HENT: Normocephalic. External right and left ear normal. Oropharynx is clear and moist.  Eyes: Conjunctivae and EOM are normal. PERRLA, no scleral icterus.  Neck: Normal ROM. Neck supple. No JVD. No tracheal deviation. No thyromegaly.  CVS: RRR, S1/S2 +, no murmurs, no gallops, no carotid bruit.  Pulmonary: Effort and breath sounds normal, no stridor, rhonchi, wheezes, rales.  Abdominal: Soft. BS +,  no distension, tenderness, rebound or guarding.  Musculoskeletal: Normal range of motion. No edema and no tenderness.  Lymphadenopathy: No lymphadenopathy noted, cervical, inguinal. Neuro: Alert. Normal reflexes, muscle tone coordination. No cranial nerve deficit. Skin: Skin is warm and dry. No rash noted. Not diaphoretic. No erythema. No pallor.  Psychiatric: Normal mood and affect. Behavior, judgment, thought content normal.    ED Course  Procedures (including critical care time)  Labs Reviewed - No data to display No results found.   1. Hypertension   - - We have discussed target BP range - I have advised pt to check BP regularly and to call us back if the numbers are higher than 140/90 - discussed the importance of compliance with medical therapy and diet   - Prescriptions provided for all the medications including pain medications     MDM  Hypertension        Alison Murray, MD 06/29/12 1118

## 2012-09-02 ENCOUNTER — Encounter: Payer: Self-pay | Admitting: Family Medicine

## 2012-09-02 ENCOUNTER — Ambulatory Visit: Payer: Self-pay | Attending: Family Medicine | Admitting: Family Medicine

## 2012-09-02 DIAGNOSIS — M79609 Pain in unspecified limb: Secondary | ICD-10-CM | POA: Insufficient documentation

## 2012-09-02 DIAGNOSIS — M79671 Pain in right foot: Secondary | ICD-10-CM

## 2012-09-02 DIAGNOSIS — M79673 Pain in unspecified foot: Secondary | ICD-10-CM | POA: Insufficient documentation

## 2012-09-02 DIAGNOSIS — G8929 Other chronic pain: Secondary | ICD-10-CM | POA: Insufficient documentation

## 2012-09-02 DIAGNOSIS — M2140 Flat foot [pes planus] (acquired), unspecified foot: Secondary | ICD-10-CM

## 2012-09-02 DIAGNOSIS — M2141 Flat foot [pes planus] (acquired), right foot: Secondary | ICD-10-CM | POA: Insufficient documentation

## 2012-09-02 DIAGNOSIS — D649 Anemia, unspecified: Secondary | ICD-10-CM

## 2012-09-02 DIAGNOSIS — F17200 Nicotine dependence, unspecified, uncomplicated: Secondary | ICD-10-CM | POA: Insufficient documentation

## 2012-09-02 LAB — CBC
HCT: 31.5 % — ABNORMAL LOW (ref 36.0–46.0)
Hemoglobin: 9.7 g/dL — ABNORMAL LOW (ref 12.0–15.0)
MCH: 18 pg — ABNORMAL LOW (ref 26.0–34.0)
MCHC: 30.8 g/dL (ref 30.0–36.0)

## 2012-09-02 MED ORDER — LISINOPRIL 2.5 MG PO TABS: 2.5000 mg | ORAL_TABLET | Freq: Every day | ORAL | Status: DC

## 2012-09-02 MED ORDER — TRAMADOL HCL 50 MG PO TABS: 50.0000 mg | ORAL_TABLET | Freq: Three times a day (TID) | ORAL | Status: DC | PRN

## 2012-09-02 MED ORDER — HYDROCHLOROTHIAZIDE 50 MG PO TABS: 50.0000 mg | ORAL_TABLET | Freq: Every day | ORAL | Status: DC

## 2012-09-02 MED ORDER — CLONIDINE HCL 0.3 MG PO TABS: 0.3000 mg | ORAL_TABLET | Freq: Two times a day (BID) | ORAL | Status: DC

## 2012-09-02 NOTE — Progress Notes (Signed)
Patient ID: Kelly Novak, female   DOB: [birth year], 42 y.o.   MRN: [id number]  WU:[id number]  HPI: Pt says that She is having pain in her right foot.  She reports that she has had this pain for several weeks but it has been increasing.she reports that she does understand that she has been overweight but because of her flat feet she's continued to have chronic pain.  She otherwise reports that she's been doing fairly well.  She is taking her medications as prescribed.  She does need refill on her medications.  She also would like to have her hemoglobin tested.  No Known Allergies Past Medical History  Diagnosis Date  . Arthritis   . Hypertension   . Chronic back pain    Current Outpatient Prescriptions on File Prior to Visit  Medication Sig Dispense Refill  . Ferrous Fumarate 150 MG TABS Take 1 tablet (150 mg total) by mouth 2 (two) times daily.  60 each  2   No current facility-administered medications on file prior to visit.   History reviewed. No pertinent family history. History   Social History  . Marital Status: Widowed    Spouse Name: N/A    Number of Children: N/A  . Years of Education: N/A   Occupational History  . Not on file.   Social History Main Topics  . Smoking status: Current Every Day Smoker -- 1.50 packs/day    Types: Cigarettes  . Smokeless tobacco: Not on file  . Alcohol Use: No  . Drug Use: No  . Sexually Active: No   Other Topics Concern  . Not on file   Social History Narrative  . No narrative on file    Review of Systems  Constitutional: Negative for fever, chills, diaphoresis, activity change, appetite change and fatigue.  HENT: Negative for ear pain, nosebleeds, congestion, facial swelling, rhinorrhea, neck pain, neck stiffness and ear discharge.   Eyes: Negative for pain, discharge, redness, itching and visual disturbance.  Respiratory: Negative for cough, choking, chest tightness, shortness of breath, wheezing and stridor.   Cardiovascular:  Negative for chest pain, palpitations and leg swelling.  Gastrointestinal: Negative for abdominal distention.  Genitourinary: Negative for dysuria, urgency, frequency, hematuria, flank pain, decreased urine volume, difficulty urinating and dyspareunia.  Musculoskeletal: Negative for back pain, joint swelling, arthralgias and gait problem.  Neurological: Negative for dizziness, tremors, seizures, syncope, facial asymmetry, speech difficulty, weakness, light-headedness, numbness and headaches.  Hematological: Negative for adenopathy. Does not bruise/bleed easily.  Psychiatric/Behavioral: Negative for hallucinations, behavioral problems, confusion, dysphoric mood, decreased concentration and agitation.    Objective:   Filed Vitals:   09/02/12 1348  BP: 132/81  Pulse: 77  Temp: 99 F (37.2 C)  Resp: 20    Physical Exam  Constitutional: overweight female, pleasant, Appears well-developed and well-nourished. No distress.  HENT: Normocephalic. External right and left ear normal. Oropharynx is clear and moist.  Eyes: Conjunctivae and EOM are normal. PERRLA, no scleral icterus.  Neck: Normal ROM. Neck supple. No JVD. No tracheal deviation. No thyromegaly.  CVS: RRR, S1/S2 +, no murmurs, no gallops, no carotid bruit.  Pulmonary: Effort and breath sounds normal, no stridor, rhonchi, wheezes, rales.  Abdominal: Soft. BS +,  no distension, tenderness, rebound or guarding.  Musculoskeletal: bilateral pes planus  Lymphadenopathy: No lymphadenopathy noted, cervical, inguinal. Neuro: Alert. Normal reflexes, muscle tone coordination. No cranial nerve deficit. Skin: Skin is warm and dry. No rash noted. Not diaphoretic. No erythema. No pallor.  Psychiatric: Normal mood  and affect. Behavior, judgment, thought content normal.   Lab Results  Component Value Date   WBC 9.2 02/09/2012   HGB 11.2* 02/09/2012   HCT 33.0* 02/09/2012   MCV 61.6* 02/09/2012   PLT 293 02/09/2012   Lab Results  Component  Value Date   CREATININE 0.80 02/09/2012   BUN 14 02/09/2012   NA 143 02/09/2012   K 3.3* 02/09/2012   CL 104 02/09/2012   CO2 25 08/15/2010    No results found for this basename: HGBA1C   Lipid Panel  No results found for this basename: chol, trig, hdl, cholhdl, vldl, ldlcalc     Assessment and plan:   Patient Active Problem List   Diagnosis Date Noted  . Pes planus (flat feet) 09/02/2012  . Anemia 09/02/2012  . Foot pain 09/02/2012   Refilled meds today for blood pressure  Check lab today   Referral to podiatry for further evaluation and treatment of pes planus and chronic foot pain.  I did refill her tramadol today but I explained to her that we didn't do a lot of chronic pain management here at this clinic.  The patient verbalized understanding.  I also strongly advised the patient to stop using tobacco products.  I think that if she stopped tobacco products it could help with her chronic pain issues. also it can improve her overall health.  The patient was given clear instructions to go to ER or return to medical center if symptoms don't improve, worsen or new problems develop.  The patient verbalized understanding.  The patient was told to call to get any lab results if not heard anything in the next week.    Rodney Langton, MD, CDE, FAAFP [facility] [place], [place]

## 2012-09-02 NOTE — Patient Instructions (Addendum)
Hypertension As your heart beats, it forces blood through your arteries. This force is your blood pressure. If the pressure is too high, it is called hypertension (HTN) or high blood pressure. HTN is dangerous because you may have it and not know it. High blood pressure may mean that your heart has to work harder to pump blood. Your arteries may be narrow or stiff. The extra work puts you at risk for heart disease, stroke, and other problems.  Blood pressure consists of two numbers, a higher number over a lower, 110/72, for example. It is stated as "110 over 72." The ideal is below 120 for the top number (systolic) and under 80 for the bottom (diastolic). Write down your blood pressure today. You should pay close attention to your blood pressure if you have certain conditions such as:  Heart failure.  Prior heart attack.  Diabetes  Chronic kidney disease.  Prior stroke.  Multiple risk factors for heart disease. To see if you have HTN, your blood pressure should be measured while you are seated with your arm held at the level of the heart. It should be measured at least twice. A one-time elevated blood pressure reading (especially in the Emergency Department) does not mean that you need treatment. There may be conditions in which the blood pressure is different between your right and left arms. It is important to see your caregiver soon for a recheck. Most people have essential hypertension which means that there is not a specific cause. This type of high blood pressure may be lowered by changing lifestyle factors such as:  Stress.  Smoking.  Lack of exercise.  Excessive weight.  Drug/tobacco/alcohol use.  Eating less salt. Most people do not have symptoms from high blood pressure until it has caused damage to the body. Effective treatment can often prevent, delay or reduce that damage. TREATMENT  When a cause has been identified, treatment for high blood pressure is directed at the  cause. There are a large number of medications to treat HTN. These fall into several categories, and your caregiver will help you select the medicines that are best for you. Medications may have side effects. You should review side effects with your caregiver. If your blood pressure stays high after you have made lifestyle changes or started on medicines,   Your medication(s) may need to be changed.  Other problems may need to be addressed.  Be certain you understand your prescriptions, and know how and when to take your medicine.  Be sure to follow up with your caregiver within the time frame advised (usually within two weeks) to have your blood pressure rechecked and to review your medications.  If you are taking more than one medicine to lower your blood pressure, make sure you know how and at what times they should be taken. Taking two medicines at the same time can result in blood pressure that is too low. SEEK IMMEDIATE MEDICAL CARE IF:  You develop a severe headache, blurred or changing vision, or confusion.  You have unusual weakness or numbness, or a faint feeling.  You have severe chest or abdominal pain, vomiting, or breathing problems. MAKE SURE YOU:   Understand these instructions.  Will watch your condition.  Will get help right away if you are not doing well or get worse. Document Released: 02/09/2005 Document Revised: 05/04/2011 Document Reviewed: 09/30/2007 [facility] Patient Information 2014 [place], [place].  Flat Feet Having flat feet is a common condition. One foot or both might be  affected. People of any age can have flat feet. In fact, everyone is born with them. But most of the time, the foot gradually develops an arch. That is the curve on the bottom of the foot that creates a gap between the foot and the ground. An arch usually develops in childhood. Sometimes, though, an arch never develops and the foot stays flat on the bottom. Other times, an arch develops but  later collapses (caves in). That is what gives the condition its nickname, "fallen arches." The medical term for flat feet is pes planus. Some people have flat feet their whole life and have no problems. For others, the condition causes pain and needs to be corrected.  CAUSES   A problem with the foot's soft tissue; tendons and ligaments could be loose.  This can cause what is called flexible flat feet. That means the shape of the foot changes with pressure. When standing on the toes, a curved arch can be seen. When standing on the ground, the foot is flat.  Wear and tear. Sometimes arches simply flatten over time.  Damage to the posterior tibial tendon. This is the tendon that goes from the inside of the ankle to the bones in the middle of the foot. It is the main support for the arch. If the tendon is injured, stretched or torn, the arch might flatten.  Tarsal coalition. With this condition, two or more bones in the foot are joined together (fused ) during development in the womb. This limits movement and can lead to a flat foot. SYMPTOMS   The foot is even with the ground from toe to heel. Your caregiver will look closely at the inside of the foot while you are standing.  Pain along the bottom of the foot. Some people describe the pain as tightness.  Swelling on the inside of the foot or ankle.  Changes in the way you walk (gait).  The feet lean inward, starting at the ankle (pronation). DIAGNOSIS  To decide if a child or adult has flat feet, a healthcare provider will probably:  Do a physical examination. This might include having the person stand on his or her toes and then stand normally. The caregiver will also hold the foot and put pressure on the foot in different directions.  Check the person's shoes. The pattern of wear on the soles can offer clues.  Order images (pictures) of the foot. They can help identify the cause of any pain. They also will show injuries to bones or  tendons that could be causing the condition. The images can come from:  X-rays.  Computed tomography (CT) scan. This combines X-ray and a computer.  Magnetic resonance imaging (MRI). This uses magnets, radio waves and a computer to take a picture of the foot. It is the best technique to evaluate tendons, ligaments and muscles. TREATMENT   Flexible flat feet usually are painless. Most of the time, gait is not affected. Most children grow out of the condition. Often no treatment is needed. If there is pain, treatment options include:  Orthotics. These are inserts that go in the shoes. They add support and shape to the feet. An orthotic is custom-made from a mold of the foot.  Shoes. Not all shoes are the same. People with flat feet need arch support. However, too much can be painful. It is important to find shoes that offer the right amount of support. Athletes, especially runners, may need to try shoes made just for people  with flatter feet.  Medication. For pain, only take over-the-counter medicine for pain, discomfort, as directed by your caregiver.  Rest. If the feet start to hurt, cut back on the exercise which increases the pain. Use common sense.  For damage to the posterior tibial tendon, options include:  Orthotics. Also adding a wedge on the inside edge may help. This can relieve pressure on the tendon.  Ankle brace, boot or cast. These supports can ease the load on the tendon while it heals.  Surgery. If the tendon is torn, it might need to be repaired.  For tarsal coalition, similar options apply:  Pain medication.  Orthotics.  A cast and crutches. This keeps weight off the foot.  Physical therapy.  Surgery to remove the bone bridge joining the two bones together. PROGNOSIS  In most people, flat feet do not cause pain or problems. People can go about their normal activities. However, if flat feet are painful, they can and should be treated. Treatment usually relieves  the pain. HOME CARE INSTRUCTIONS   Take any medications prescribed by the healthcare provider. Follow the directions carefully.  Wear, or make sure a child wears, orthotics or special shoes if this was suggested. Be sure to ask how often and for how long they should be worn.  Do any exercises or therapy treatments that were suggested.  Take notes on when the pain occurs. This will help healthcare providers decide how to treat the condition.  If surgery is needed, be sure to find out if there is anything that should or should not be done before the operation. SEEK MEDICAL CARE IF:   Pain worsens in the foot or lower leg.  Pain disappears after treatment, but then returns.  Walking or simple exercise becomes difficult or causes foot pain.  Orthotics or special shoes are uncomfortable or painful. Document Released: 12/07/2008 Document Revised: 05/04/2011 Document Reviewed: 12/07/2008 [facility] Patient Information 2014 [place], [place].

## 2012-09-02 NOTE — Progress Notes (Signed)
PT HERE FOR RX REFILL BP MEDS. RAN OUT LAST MONTH. HAS BEEN USING HER BROTHERS CLONIDINE. BP 132/81. ALSO C/O RT FOOT PAIN RADIATING TO HEEL AREA. NEED REFERRAL

## 2012-09-03 LAB — COMPLETE METABOLIC PANEL WITH GFR
ALT: 14 U/L (ref 0–35)
Alkaline Phosphatase: 93 U/L (ref 39–117)
CO2: 26 mEq/L (ref 19–32)
Sodium: 139 mEq/L (ref 135–145)
Total Bilirubin: 0.4 mg/dL (ref 0.3–1.2)
Total Protein: 6.6 g/dL (ref 6.0–8.3)

## 2012-09-05 ENCOUNTER — Telehealth: Payer: Self-pay

## 2012-09-05 ENCOUNTER — Ambulatory Visit: Payer: Self-pay

## 2012-09-05 NOTE — Telephone Encounter (Signed)
Patient is aware of her lab results 

## 2012-09-05 NOTE — Telephone Encounter (Signed)
Message copied by Lestine Mount on Mon Sep 05, 2012 10:50 AM ------      Message from: Cleora Fleet      Created: Mon Sep 05, 2012  7:16 AM       Please inform patient that her labs came back OK except that her hemoglobin is dropping again down to 9.7.  Continue taking iron supplement and recheck cbc in about 6 weeks.              Rodney Langton, MD, CDE, FAAFP      [facility]      [facility]      [place], [place]        ------

## 2012-09-05 NOTE — Progress Notes (Signed)
Quick Note:  Please inform patient that her labs came back OK except that her hemoglobin is dropping again down to 9.7. Continue taking iron supplement and recheck cbc in about 6 weeks.   Rodney Langton, MD, CDE, FAAFP [facility] [place], [place]   ______

## 2012-10-21 ENCOUNTER — Other Ambulatory Visit: Payer: Self-pay | Admitting: Internal Medicine

## 2012-10-21 MED ORDER — TRAMADOL HCL 50 MG PO TABS: 50.0000 mg | ORAL_TABLET | Freq: Three times a day (TID) | ORAL | Status: DC | PRN

## 2012-10-31 ENCOUNTER — Encounter (HOSPITAL_COMMUNITY): Payer: Self-pay | Admitting: Emergency Medicine

## 2012-10-31 ENCOUNTER — Emergency Department (HOSPITAL_COMMUNITY)
Admission: EM | Admit: 2012-10-31 | Discharge: 2012-10-31 | Disposition: A | Discharge status: Home / Self Care | Payer: Self-pay | Attending: Emergency Medicine | Admitting: Emergency Medicine

## 2012-10-31 ENCOUNTER — Emergency Department (HOSPITAL_COMMUNITY): Payer: Self-pay

## 2012-10-31 DIAGNOSIS — F17200 Nicotine dependence, unspecified, uncomplicated: Secondary | ICD-10-CM | POA: Insufficient documentation

## 2012-10-31 DIAGNOSIS — Z9851 Tubal ligation status: Secondary | ICD-10-CM | POA: Insufficient documentation

## 2012-10-31 DIAGNOSIS — M25521 Pain in right elbow: Secondary | ICD-10-CM

## 2012-10-31 DIAGNOSIS — Z79899 Other long term (current) drug therapy: Secondary | ICD-10-CM | POA: Insufficient documentation

## 2012-10-31 DIAGNOSIS — B9689 Other specified bacterial agents as the cause of diseases classified elsewhere: Secondary | ICD-10-CM

## 2012-10-31 DIAGNOSIS — S5000XA Contusion of unspecified elbow, initial encounter: Secondary | ICD-10-CM | POA: Insufficient documentation

## 2012-10-31 DIAGNOSIS — G8929 Other chronic pain: Secondary | ICD-10-CM | POA: Insufficient documentation

## 2012-10-31 DIAGNOSIS — M129 Arthropathy, unspecified: Secondary | ICD-10-CM | POA: Insufficient documentation

## 2012-10-31 DIAGNOSIS — I10 Essential (primary) hypertension: Secondary | ICD-10-CM | POA: Insufficient documentation

## 2012-10-31 DIAGNOSIS — N390 Urinary tract infection, site not specified: Secondary | ICD-10-CM | POA: Insufficient documentation

## 2012-10-31 DIAGNOSIS — Z9089 Acquired absence of other organs: Secondary | ICD-10-CM | POA: Insufficient documentation

## 2012-10-31 DIAGNOSIS — Z792 Long term (current) use of antibiotics: Secondary | ICD-10-CM | POA: Insufficient documentation

## 2012-10-31 DIAGNOSIS — N760 Acute vaginitis: Secondary | ICD-10-CM | POA: Insufficient documentation

## 2012-10-31 DIAGNOSIS — Z760 Encounter for issue of repeat prescription: Secondary | ICD-10-CM | POA: Insufficient documentation

## 2012-10-31 LAB — COMPREHENSIVE METABOLIC PANEL
Alkaline Phosphatase: 111 U/L (ref 39–117)
BUN: 10 mg/dL (ref 6–23)
GFR calc Af Amer: >90 mL/min (ref >90)
GFR calc non Af Amer: >90 mL/min (ref >90)
Glucose, Bld: 92 mg/dL (ref 70–99)
Potassium: 4.4 mEq/L (ref 3.5–5.1)
Total Protein: 7.1 g/dL (ref 6.0–8.3)

## 2012-10-31 LAB — URINALYSIS, ROUTINE W REFLEX MICROSCOPIC
Ketones, ur: NEGATIVE mg/dL
Nitrite: NEGATIVE
Protein, ur: NEGATIVE mg/dL
Urobilinogen, UA: 0.2 mg/dL (ref 0.0–1.0)
pH: 6 (ref 5.0–8.0)

## 2012-10-31 LAB — CBC WITH DIFFERENTIAL/PLATELET
Basophils Absolute: 0.1 10*3/uL (ref 0.0–0.1)
Lymphs Abs: 2.5 10*3/uL (ref 0.7–4.0)
MCH: 18.5 pg — ABNORMAL LOW (ref 26.0–34.0)
MCV: 59.6 fL — ABNORMAL LOW (ref 78.0–100.0)
Monocytes Absolute: 0.5 10*3/uL (ref 0.1–1.0)
Platelets: 344 10*3/uL (ref 150–400)
RDW: 17.5 % — ABNORMAL HIGH (ref 11.5–15.5)

## 2012-10-31 LAB — URINE MICROSCOPIC-ADD ON

## 2012-10-31 MED ORDER — CEPHALEXIN 500 MG PO CAPS: 500.0000 mg | ORAL_CAPSULE | Freq: Four times a day (QID) | ORAL | Status: DC

## 2012-10-31 MED ORDER — PROMETHAZINE HCL 25 MG PO TABS: 25.0000 mg | ORAL_TABLET | Freq: Four times a day (QID) | ORAL | Status: DC | PRN

## 2012-10-31 MED ORDER — HYDROCHLOROTHIAZIDE 50 MG PO TABS: 50.0000 mg | ORAL_TABLET | Freq: Every day | ORAL | Status: DC

## 2012-10-31 MED ORDER — ONDANSETRON 4 MG PO TBDP
8.0000 mg | ORAL_TABLET | Freq: Once | ORAL | Status: AC
  Administered 2012-10-31: 8 mg via ORAL
  Filled 2012-10-31: qty 2

## 2012-10-31 MED ORDER — METRONIDAZOLE 500 MG PO TABS: 500.0000 mg | ORAL_TABLET | Freq: Two times a day (BID) | ORAL | Status: DC

## 2012-10-31 MED ORDER — HYDROCODONE-ACETAMINOPHEN 5-325 MG PO TABS
2.0000 | ORAL_TABLET | Freq: Once | ORAL | Status: AC
  Administered 2012-10-31: 2 via ORAL
  Filled 2012-10-31: qty 2

## 2012-10-31 MED ORDER — CLONIDINE HCL 0.3 MG PO TABS: 0.3000 mg | ORAL_TABLET | Freq: Two times a day (BID) | ORAL | Status: DC

## 2012-10-31 MED ORDER — LISINOPRIL 2.5 MG PO TABS: 2.5000 mg | ORAL_TABLET | Freq: Every day | ORAL | Status: DC

## 2012-10-31 MED ORDER — HYDROCODONE-ACETAMINOPHEN 5-325 MG PO TABS: 1.0000 | ORAL_TABLET | ORAL | Status: DC | PRN

## 2012-10-31 MED ORDER — FERROUS SULFATE 325 (65 FE) MG PO TABS: 325.0000 mg | ORAL_TABLET | Freq: Every day | ORAL | Status: DC

## 2012-10-31 NOTE — ED Notes (Signed)
Pt c/o lower abd pain x 3 days; pt sts right elbow pain x months
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
## 2012-10-31 NOTE — ED Notes (Signed)
Pt ambulating independently w/ steady gait on d/c in no acute distress, A&Ox4. D/c instructions reviewed w/ pt and family - pt and family deny any further questions or concerns at present. Rx given x6, Pt instructed to not use alcohol, drive, or operate heavy machinery while take the prescription pain medications as they could make him drowsy - pt verbalized understanding.

## 2012-11-01 LAB — GC/CHLAMYDIA PROBE AMP: GC Probe RNA: NEGATIVE

## 2012-11-02 LAB — URINE CULTURE

## 2012-11-03 ENCOUNTER — Ambulatory Visit: Payer: Self-pay

## 2012-11-03 NOTE — ED Provider Notes (Signed)
Medical screening examination/treatment/procedure(s) were performed by non-physician practitioner and as supervising physician I was immediately available for consultation/collaboration.   Laray Anger, DO 11/03/12 2258

## 2013-03-26 ENCOUNTER — Emergency Department (HOSPITAL_COMMUNITY)
Admission: EM | Admit: 2013-03-26 | Discharge: 2013-03-26 | Disposition: A | Discharge status: Home / Self Care | Payer: Medicaid Other | Attending: Emergency Medicine | Admitting: Emergency Medicine

## 2013-03-26 ENCOUNTER — Emergency Department (HOSPITAL_COMMUNITY): Payer: Medicaid Other

## 2013-03-26 ENCOUNTER — Encounter (HOSPITAL_COMMUNITY): Payer: Self-pay | Admitting: Emergency Medicine

## 2013-03-26 ENCOUNTER — Emergency Department (INDEPENDENT_AMBULATORY_CARE_PROVIDER_SITE_OTHER)
Admission: EM | Admit: 2013-03-26 | Discharge: 2013-03-26 | Disposition: A | Discharge status: Nursing Home (Includes ICF) | Payer: Medicaid Other | Source: Home / Self Care | Attending: Emergency Medicine | Admitting: Emergency Medicine

## 2013-03-26 DIAGNOSIS — I10 Essential (primary) hypertension: Secondary | ICD-10-CM | POA: Insufficient documentation

## 2013-03-26 DIAGNOSIS — Z87891 Personal history of nicotine dependence: Secondary | ICD-10-CM | POA: Insufficient documentation

## 2013-03-26 DIAGNOSIS — Z79899 Other long term (current) drug therapy: Secondary | ICD-10-CM | POA: Insufficient documentation

## 2013-03-26 DIAGNOSIS — G8929 Other chronic pain: Secondary | ICD-10-CM | POA: Insufficient documentation

## 2013-03-26 DIAGNOSIS — Z8739 Personal history of other diseases of the musculoskeletal system and connective tissue: Secondary | ICD-10-CM | POA: Insufficient documentation

## 2013-03-26 DIAGNOSIS — R0789 Other chest pain: Secondary | ICD-10-CM

## 2013-03-26 DIAGNOSIS — F411 Generalized anxiety disorder: Secondary | ICD-10-CM | POA: Insufficient documentation

## 2013-03-26 HISTORY — DX: Anxiety disorder, unspecified: F41.9

## 2013-03-26 LAB — CBC WITH DIFFERENTIAL/PLATELET
BASOS PCT: 0 % (ref 0–1)
Basophils Absolute: 0 10*3/uL (ref 0.0–0.1)
EOS ABS: 0.2 10*3/uL (ref 0.0–0.7)
Eosinophils Relative: 2 % (ref 0–5)
HCT: 32.6 % — ABNORMAL LOW (ref 36.0–46.0)
HEMOGLOBIN: 10 g/dL — AB (ref 12.0–15.0)
Lymphocytes Relative: 32 % (ref 12–46)
Lymphs Abs: 3.3 10*3/uL (ref 0.7–4.0)
MCH: 18.3 pg — AB (ref 26.0–34.0)
MCHC: 30.7 g/dL (ref 30.0–36.0)
MCV: 59.8 fL — ABNORMAL LOW (ref 78.0–100.0)
MONO ABS: 0.8 10*3/uL (ref 0.1–1.0)
Monocytes Relative: 8 % (ref 3–12)
NEUTROS PCT: 58 % (ref 43–77)
Neutro Abs: 6 10*3/uL (ref 1.7–7.7)
Platelets: 306 10*3/uL (ref 150–400)
RBC: 5.45 MIL/uL — ABNORMAL HIGH (ref 3.87–5.11)
RDW: 18.1 % — ABNORMAL HIGH (ref 11.5–15.5)
WBC: 10.3 10*3/uL (ref 4.0–10.5)

## 2013-03-26 LAB — BASIC METABOLIC PANEL
BUN: 14 mg/dL (ref 6–23)
CALCIUM: 8.3 mg/dL — AB (ref 8.4–10.5)
CO2: 22 meq/L (ref 19–32)
CREATININE: 0.61 mg/dL (ref 0.50–1.10)
Chloride: 100 mEq/L (ref 96–112)
GFR calc Af Amer: >90 mL/min (ref >90)
GFR calc non Af Amer: >90 mL/min (ref >90)
GLUCOSE: 100 mg/dL — AB (ref 70–99)
Potassium: 3.9 mEq/L (ref 3.7–5.3)
Sodium: 138 mEq/L (ref 137–147)

## 2013-03-26 LAB — TROPONIN I: Troponin I: <0.3 ng/mL (ref <0.30)

## 2013-03-26 MED ORDER — NITROGLYCERIN 0.4 MG SL SUBL
SUBLINGUAL_TABLET | SUBLINGUAL | Status: AC
  Filled 2013-03-26: qty 25

## 2013-03-26 MED ORDER — CLONIDINE HCL 0.3 MG PO TABS: 0.3000 mg | ORAL_TABLET | Freq: Two times a day (BID) | ORAL | Status: DC

## 2013-03-26 MED ORDER — LISINOPRIL 2.5 MG PO TABS: 2.5000 mg | ORAL_TABLET | Freq: Every day | ORAL | Status: DC

## 2013-03-26 MED ORDER — METOPROLOL SUCCINATE ER 100 MG PO TB24: 100.0000 mg | ORAL_TABLET | Freq: Every day | ORAL | Status: DC

## 2013-03-26 MED ORDER — NITROGLYCERIN 0.4 MG SL SUBL
0.4000 mg | SUBLINGUAL_TABLET | SUBLINGUAL | Status: AC | PRN
  Administered 2013-03-26: 0.4 mg via SUBLINGUAL

## 2013-03-26 MED ORDER — LABETALOL HCL 5 MG/ML IV SOLN
20.0000 mg | Freq: Once | INTRAVENOUS | Status: AC
  Administered 2013-03-26: 20 mg via INTRAVENOUS
  Filled 2013-03-26: qty 4

## 2013-03-26 MED ORDER — CLONIDINE HCL 0.2 MG PO TABS
0.3000 mg | ORAL_TABLET | Freq: Once | ORAL | Status: AC
  Administered 2013-03-26: 0.3 mg via ORAL
  Filled 2013-03-26 (×2): qty 1

## 2013-03-26 MED ORDER — SODIUM CHLORIDE 0.9 % IV SOLN: INTRAVENOUS | Status: DC

## 2013-03-26 MED ORDER — ASPIRIN 81 MG PO CHEW
CHEWABLE_TABLET | ORAL | Status: AC
  Filled 2013-03-26: qty 4

## 2013-03-26 MED ORDER — ACETAMINOPHEN 325 MG PO TABS
650.0000 mg | ORAL_TABLET | Freq: Once | ORAL | Status: AC
  Administered 2013-03-26: 650 mg via ORAL
  Filled 2013-03-26: qty 2

## 2013-03-26 MED ORDER — ASPIRIN 81 MG PO CHEW
324.0000 mg | CHEWABLE_TABLET | Freq: Once | ORAL | Status: AC
Start: 2013-03-26 — End: 2013-03-26
  Administered 2013-03-26: 324 mg via ORAL

## 2013-03-26 NOTE — ED Notes (Signed)
C/o chest pain onset this AM and continuous.  She took Ibuprofen 800  Mg. @ 0700 without relief.  Gets out breath when she walks but none at rest.  C/o nausea and had sweating earlier.  States she knew her BP was up because her chest, head  was tight and was nauseated.  Has been off her Clonidine since Friday.  No Lisinopril since last month. BP 220/130 manually.

## 2013-03-26 NOTE — ED Provider Notes (Addendum)
CSN: [id number]     Arrival date & time 03/26/13  4098 History   First MD Initiated Contact with Patient 03/26/13 1830     Chief Complaint  Patient presents with  . Chest Pain   (Consider location/radiation/quality/duration/timing/severity/associated sxs/prior Treatment) Patient is a 43 y.o. female presenting with chest pain.  Chest Pain  Pt with history of HTN has been out of her lisinopril and Toprol for several weeks and took her last clonidine 0.3mg  tab yesterday. She felt like her BP was elevated this morning and has had numerous complaints related to same including HA, dizziness, SOB and chest tightness all similar to previous episodes of elevated BP. She was seen at [facility] and sent to the ED for further eval.   Past Medical History  Diagnosis Date  . Arthritis   . Hypertension   . Chronic back pain   . Anxiety    Past Surgical History  Procedure Laterality Date  . Cholecystectomy    . Cesarean section    . Tubal ligation     No family history on file. History  Substance Use Topics  . Smoking status: Former Smoker -- 14 years    Types: Cigarettes    Quit date: 11/27/2012  . Smokeless tobacco: Not on file  . Alcohol Use: No   OB History   Grav Para Term Preterm Abortions TAB SAB Ect Mult Living                 Review of Systems  Cardiovascular: Positive for chest pain.   All other systems reviewed and are negative except as noted in HPI.   Allergies  Review of patient's allergies indicates no known allergies.  Home Medications   Current Outpatient Rx  Name  Route  Sig  Dispense  Refill  . CarBAMazepine (TEGRETOL PO)   Oral   Take 50 mg by mouth daily.          . ferrous sulfate 325 (65 FE) MG tablet   Oral   Take 1 tablet (325 mg total) by mouth daily.   30 tablet   0   . PARoxetine (PAXIL) 40 MG tablet   Oral   Take 40 mg by mouth every morning.         . traMADol (ULTRAM) 50 MG tablet   Oral   Take 1 tablet (50 mg total) by mouth every 8  (eight) hours as needed for pain.   30 tablet   1   . cloNIDine (CATAPRES) 0.3 MG tablet   Oral   Take 1 tablet (0.3 mg total) by mouth 2 (two) times daily.   60 tablet   4   . hydrochlorothiazide (HYDRODIURIL) 50 MG tablet   Oral   Take 1 tablet (50 mg total) by mouth daily.   30 tablet   3   . lisinopril (PRINIVIL,ZESTRIL) 2.5 MG tablet   Oral   Take 1 tablet (2.5 mg total) by mouth daily.   30 tablet   4   . metoprolol succinate (TOPROL-XL) 100 MG 24 hr tablet   Oral   Take 100 mg by mouth daily. Take with or immediately following a meal.          BP 175/104  Pulse 72  Temp(Src) 97.6 F (36.4 C) (Oral)  Resp 19  SpO2 99%  LMP 02/23/2013 Physical Exam  Nursing note and vitals reviewed. Constitutional: She is oriented to person, place, and time. She appears well-developed and well-nourished.  HENT:  Head:  Normocephalic and atraumatic.  Eyes: EOM are normal. Pupils are equal, round, and reactive to light.  Neck: Normal range of motion. Neck supple.  Cardiovascular: Normal rate, normal heart sounds and intact distal pulses.   Pulmonary/Chest: Effort normal and breath sounds normal.  Abdominal: Bowel sounds are normal. She exhibits no distension. There is no tenderness.  Musculoskeletal: Normal range of motion. She exhibits no edema and no tenderness.  Neurological: She is alert and oriented to person, place, and time. She has normal strength. No cranial nerve deficit or sensory deficit.  Skin: Skin is warm and dry. No rash noted.  Psychiatric: She has a normal mood and affect.    ED Course  Procedures (including critical care time) Labs Review Labs Reviewed  CBC WITH DIFFERENTIAL - Abnormal; Notable for the following:    RBC 5.45 (*)    Hemoglobin 10.0 (*)    HCT 32.6 (*)    MCV 59.8 (*)    MCH 18.3 (*)    RDW 18.1 (*)    All other components within normal limits  BASIC METABOLIC PANEL - Abnormal; Notable for the following:    Glucose, Bld 100 (*)     Calcium 8.3 (*)    All other components within normal limits  TROPONIN I   Imaging Review No results found.  EKG Interpretation    Date/Time:  Sunday March 26 2013 18:29:15 EST Ventricular Rate:  68 PR Interval:  172 QRS Duration: 82 QT Interval:  433 QTC Calculation: 460 R Axis:   64 Text Interpretation:  Sinus rhythm Normal ECG No significant change since last tracing Confirmed by Khaliah Barnick  MD, Naeem Quillin ([id number]) on 03/26/2013 6:32:16 PM            MDM   1. Hypertension     Marked HTN after running out of meds, in part due to rebound HTN from not taking clonidine today. Will check for signs of end organ damage.  9:54 PM No signs of EOD, BP improving with clonidine missed at home. Symptoms improved and ready to go home. Will need refills on meds.   Michel Eskelson B. Karle Starch, MD 03/26/13 2156  [place] Karle Starch, MD 03/26/13 2156

## 2013-03-26 NOTE — ED Notes (Signed)
Report given to paramedic for [facility].  Pt. States pain is down to 4/10.  The paramedic said they were going to give her another NTG.  Dr. Jake Michaelis notified.

## 2013-03-26 NOTE — ED Notes (Signed)
Per EMS pt here from [facility] c/o CP that started at approximately 0400 this AM, pain was left sided, sharp, non-radiating and associated with nausea and SOB. Pt has hx of HTN and anxiety. NSR on monitor. Pt was given 324 ASA and 2 nitroglycerin tablets PTA. Pain decreased from 8/10 to 2/10. Hypertensive with EMS, initially 220/130 - decreased to 110 systolic palpated. Upon arrival pt reports pain is 1/10 NAD noted.

## 2013-03-26 NOTE — ED Notes (Signed)
[facility] not available.  [facility] called. 

## 2013-03-26 NOTE — Discharge Instructions (Signed)

## 2013-03-26 NOTE — Discharge Instructions (Signed)
We have determined that your problem requires further evaluation in the emergency department.  We will take care of your transport there.  Once at the emergency department, you will be evaluated by a provider and they will order whatever treatment or tests they deem necessary.  We cannot guarantee that they will do any specific test or do any specific treatment.  ° °

## 2013-03-26 NOTE — ED Provider Notes (Signed)
Chief Complaint   Chief Complaint  Patient presents with  . Chest Pain  . Hypertension    History of Present Illness    Kelly Novak is a 43 year old female with a ten-year history of high blood pressure. She's had some trouble getting her medicines filled. Right now she's only on clonidine 0.3 mg twice a day. She had been on lisinopril and Toprol as well but has been off of these for months. She feels she needs them. She does not have a regular primary care physician right now. She comes in for blood pressure control today, however she has numerous complaints that she mentions including headaches, dizziness, lightheadedness, fatigue, abdominal pain, chest tightness, shortness of breath, ankle swelling, sweats, and nausea. She denies any blurring of her vision. The chest discomfort is localized to the left pectoral area with radiation into the neck. It's been constant over the past 24 hours. She denies any history of cardiac problems. She also has a history of fibroid tumors and notes heavy bleeding and cramping abdominal pain.  Review of Systems    Other than noted above, the patient denies any of the following symptoms. Systemic:  No fever, chills, sweats, or fatigue. ENT:  No nasal congestion, rhinorrhea, or sore throat. Pulmonary:  No cough, wheezing, shortness of breath, sputum production, hemoptysis. Cardiac:  No palpitations, rapid heartbeat, dizziness, presyncope or syncope. GI:  No abdominal pain, heartburn, nausea, or vomiting. Ext:  No leg pain or swelling.  Springs    Past medical history, family history, social history, meds, and allergies were reviewed and updated as needed. She has no medication allergies. Current medications include Tegretol, ferrous sulfate, Paxil, clonidine, hydrochlorothiazide, and tramadol. Past medical history includes arthritis, hypertension, chronic back pain, and obesity.  Physical Exam     Vital signs:  BP 220/130  Pulse 70  Temp(Src) 98.4 F  (36.9 C) (Oral)  Resp 18  SpO2 100%  LMP 02/23/2013 Gen:  Alert, oriented, in no distress, skin warm and dry. Eye:  PERRL, lids and conjunctivas normal.  Sclera non-icteric. ENT:  Mucous membranes moist, pharynx clear. Neck:  Supple, no adenopathy or tenderness.  No JVD. Lungs:  Clear to auscultation, no wheezes, rales or rhonchi.  No respiratory distress. Heart:  Regular rhythm.  No gallops, murmers, clicks or rubs. Chest:  No chest wall tenderness. Abdomen:  Soft, nontender, no organomegaly or mass.  Bowel sounds normal.  No pulsatile abdominal mass or bruit. Ext:  No edema.  No calf tenderness and Homann's sign negative.  Pulses full and equal. Skin:  Warm and dry.  No rash.   Electrocardiogram     Date: 03/26/2013  Rate: 68  Rhythm: normal sinus rhythm  QRS Axis: normal  Intervals: normal  ST/T Wave abnormalities: normal and nonspecific ST changes  Conduction Disutrbances:none  Narrative Interpretation: Normal sinus rhythm, normal EKG.  Old EKG Reviewed: none available  Course in Urgent [place]   The patient was begun on normal saline at 50 mL per hour and given nitroglycerin and aspirin. She'll be transported to the emergency room via [place].  Assessment     The primary encounter diagnosis was Hypertension. A diagnosis of Chest discomfort was also pertinent to this visit.  She is being sent to the emergency room for blood pressure control and because of the chest discomfort.  Plan     The patient was transferred to the ED via [place] in stable condition.  Medical Decision Making     42 year  old female with 10 year history of HT presents today with headache, dizziness, fatigue, shortness of breath, chest tightness, nausea and sweats. The chest tightness has been going on for 2 days.  No known cardiac disease.  Her EKG was normal, but her BP was 220/130.  She is being transferred for the chest discomfort and also for blood pressure control.      Harden Mo, MD 03/26/13 [phone]

## 2013-06-05 ENCOUNTER — Emergency Department (HOSPITAL_COMMUNITY)
Admission: EM | Admit: 2013-06-05 | Discharge: 2013-06-05 | Disposition: A | Discharge status: Other Acute Inpatient Hospital | Payer: Medicaid Other | Source: Home / Self Care | Attending: Family Medicine | Admitting: Family Medicine

## 2013-06-05 ENCOUNTER — Emergency Department (HOSPITAL_COMMUNITY): Payer: Medicaid Other

## 2013-06-05 ENCOUNTER — Inpatient Hospital Stay (HOSPITAL_COMMUNITY)
Admission: EM | Admit: 2013-06-05 | Discharge: 2013-06-07 | DRG: 305 | Disposition: A | Discharge status: Home / Self Care | Payer: Medicaid Other | Attending: Internal Medicine | Admitting: Internal Medicine

## 2013-06-05 ENCOUNTER — Encounter (HOSPITAL_COMMUNITY): Payer: Self-pay | Admitting: Emergency Medicine

## 2013-06-05 DIAGNOSIS — R0789 Other chest pain: Secondary | ICD-10-CM | POA: Diagnosis present

## 2013-06-05 DIAGNOSIS — F17200 Nicotine dependence, unspecified, uncomplicated: Secondary | ICD-10-CM | POA: Diagnosis present

## 2013-06-05 DIAGNOSIS — Z8249 Family history of ischemic heart disease and other diseases of the circulatory system: Secondary | ICD-10-CM

## 2013-06-05 DIAGNOSIS — I10 Essential (primary) hypertension: Principal | ICD-10-CM | POA: Diagnosis present

## 2013-06-05 DIAGNOSIS — M129 Arthropathy, unspecified: Secondary | ICD-10-CM | POA: Diagnosis present

## 2013-06-05 DIAGNOSIS — F411 Generalized anxiety disorder: Secondary | ICD-10-CM | POA: Diagnosis present

## 2013-06-05 DIAGNOSIS — Z9119 Patient's noncompliance with other medical treatment and regimen: Secondary | ICD-10-CM

## 2013-06-05 DIAGNOSIS — Z6841 Body Mass Index (BMI) 40.0 and over, adult: Secondary | ICD-10-CM

## 2013-06-05 DIAGNOSIS — G8929 Other chronic pain: Secondary | ICD-10-CM | POA: Diagnosis present

## 2013-06-05 DIAGNOSIS — R079 Chest pain, unspecified: Secondary | ICD-10-CM

## 2013-06-05 DIAGNOSIS — Z91199 Patient's noncompliance with other medical treatment and regimen due to unspecified reason: Secondary | ICD-10-CM

## 2013-06-05 DIAGNOSIS — D649 Anemia, unspecified: Secondary | ICD-10-CM | POA: Diagnosis present

## 2013-06-05 DIAGNOSIS — I160 Hypertensive urgency: Secondary | ICD-10-CM | POA: Diagnosis present

## 2013-06-05 DIAGNOSIS — M549 Dorsalgia, unspecified: Secondary | ICD-10-CM | POA: Diagnosis present

## 2013-06-05 DIAGNOSIS — I161 Hypertensive emergency: Secondary | ICD-10-CM | POA: Diagnosis present

## 2013-06-05 LAB — CBC
HCT: 36.2 % (ref 36.0–46.0)
Hemoglobin: 11.2 g/dL — ABNORMAL LOW (ref 12.0–15.0)
MCH: 18 pg — AB (ref 26.0–34.0)
MCHC: 30.9 g/dL (ref 30.0–36.0)
MCV: 58.2 fL — ABNORMAL LOW (ref 78.0–100.0)
PLATELETS: 351 10*3/uL (ref 150–400)
RBC: 6.22 MIL/uL — ABNORMAL HIGH (ref 3.87–5.11)
RDW: 16.4 % — AB (ref 11.5–15.5)
WBC: 9 10*3/uL (ref 4.0–10.5)

## 2013-06-05 LAB — I-STAT TROPONIN, ED: Troponin i, poc: 0.02 ng/mL (ref 0.00–0.08)

## 2013-06-05 LAB — D-DIMER, QUANTITATIVE (NOT AT ARMC)

## 2013-06-05 LAB — PRO B NATRIURETIC PEPTIDE: Pro B Natriuretic peptide (BNP): 258.3 pg/mL — ABNORMAL HIGH (ref 0–125)

## 2013-06-05 MED ORDER — LABETALOL HCL 5 MG/ML IV SOLN
20.0000 mg | Freq: Once | INTRAVENOUS | Status: AC
  Administered 2013-06-05: 20 mg via INTRAVENOUS
  Filled 2013-06-05: qty 4

## 2013-06-05 MED ORDER — SODIUM CHLORIDE 0.9 % IV SOLN
Freq: Once | INTRAVENOUS | Status: AC
  Administered 2013-06-05: 21:00:00 via INTRAVENOUS

## 2013-06-05 MED ORDER — ASPIRIN 81 MG PO CHEW
324.0000 mg | CHEWABLE_TABLET | Freq: Once | ORAL | Status: AC
  Administered 2013-06-05: 324 mg via ORAL

## 2013-06-05 MED ORDER — CLONIDINE HCL 0.3 MG PO TABS
0.3000 mg | ORAL_TABLET | Freq: Two times a day (BID) | ORAL | Status: DC
  Administered 2013-06-05: 0.3 mg via ORAL
  Filled 2013-06-05 (×2): qty 1

## 2013-06-05 MED ORDER — NITROGLYCERIN 0.4 MG SL SUBL
0.4000 mg | SUBLINGUAL_TABLET | SUBLINGUAL | Status: DC | PRN
  Administered 2013-06-05: 0.4 mg via SUBLINGUAL

## 2013-06-05 MED ORDER — METOPROLOL SUCCINATE ER 100 MG PO TB24
100.0000 mg | ORAL_TABLET | Freq: Every day | ORAL | Status: DC
  Filled 2013-06-05: qty 1

## 2013-06-05 MED ORDER — NITROGLYCERIN 0.4 MG SL SUBL
0.4000 mg | SUBLINGUAL_TABLET | SUBLINGUAL | Status: DC | PRN
Start: 2013-06-05 — End: 2013-06-07
  Administered 2013-06-05: 0.4 mg via SUBLINGUAL
  Filled 2013-06-05: qty 1

## 2013-06-05 MED ORDER — NITROGLYCERIN 0.4 MG SL SUBL
SUBLINGUAL_TABLET | SUBLINGUAL | Status: AC
  Filled 2013-06-05: qty 1

## 2013-06-05 MED ORDER — ASPIRIN 81 MG PO CHEW
CHEWABLE_TABLET | ORAL | Status: AC
  Filled 2013-06-05: qty 4

## 2013-06-05 NOTE — ED Provider Notes (Signed)
CSN: [id number]     Arrival date & time 06/05/13  1850 History   None    Chief Complaint  Patient presents with  . Chest Pain   (Consider location/radiation/quality/duration/timing/severity/associated sxs/prior Treatment) Patient is a 43 y.o. female presenting with chest pain. The history is provided by the patient.  Chest Pain Pain location:  R chest Pain quality: tightness   Pain radiates to the back: no   Pain severity:  Moderate Onset quality:  Gradual Duration:  1 day (States pain began this morning) Progression:  Waxing and waning Chronicity:  New Context comment:  States she ran out of her usual antihypertensive medications and "this is what happens when I don't have my meds." Associated symptoms: fatigue and shortness of breath   Associated symptoms: no cough and no palpitations     Past Medical History  Diagnosis Date  . Arthritis   . Hypertension   . Chronic back pain   . Anxiety    Past Surgical History  Procedure Laterality Date  . Cholecystectomy    . Cesarean section    . Tubal ligation     History reviewed. No pertinent family history. History  Substance Use Topics  . Smoking status: Former Smoker -- 14 years    Types: Cigarettes    Quit date: 11/27/2012  . Smokeless tobacco: Not on file  . Alcohol Use: No   OB History   Grav Para Term Preterm Abortions TAB SAB Ect Mult Living                 Review of Systems  Constitutional: Positive for fatigue.  Eyes: Negative.   Respiratory: Positive for chest tightness, shortness of breath and wheezing. Negative for cough.   Cardiovascular: Positive for chest pain. Negative for palpitations and leg swelling.  Gastrointestinal: Negative.   Genitourinary: Negative.   Musculoskeletal: Negative.   Skin: Negative.   Neurological: Negative.     Allergies  Review of patient's allergies indicates no known allergies.  Home Medications   Current Outpatient Rx  Name  Route  Sig  Dispense  Refill  .  cloNIDine (CATAPRES) 0.3 MG tablet   Oral   Take 1 tablet (0.3 mg total) by mouth 2 (two) times daily.   60 tablet   4   . ferrous sulfate 325 (65 FE) MG tablet   Oral   Take 1 tablet (325 mg total) by mouth daily.   30 tablet   0   . hydrochlorothiazide (HYDRODIURIL) 50 MG tablet   Oral   Take 1 tablet (50 mg total) by mouth daily.   30 tablet   3   . lisinopril (PRINIVIL,ZESTRIL) 2.5 MG tablet   Oral   Take 1 tablet (2.5 mg total) by mouth daily.   30 tablet   4   . metoprolol succinate (TOPROL-XL) 100 MG 24 hr tablet   Oral   Take 1 tablet (100 mg total) by mouth daily. Take with or immediately following a meal.   30 tablet   0   . PARoxetine (PAXIL) 40 MG tablet   Oral   Take 40 mg by mouth every morning.         . traMADol (ULTRAM) 50 MG tablet   Oral   Take 1 tablet (50 mg total) by mouth every 8 (eight) hours as needed for pain.   30 tablet   1    BP 246/120  Pulse 120  Temp(Src) 98.3 F (36.8 C) (Oral)  Resp 20  SpO2 100%  LMP 05/24/2013 Physical Exam  Nursing note and vitals reviewed. Constitutional: She is oriented to person, place, and time. She appears well-developed and well-nourished.  +mobidly obese  HENT:  Head: Normocephalic and atraumatic.  Eyes: Conjunctivae are normal.  Neck: No JVD present.  Cardiovascular: Regular rhythm and normal heart sounds.  Tachycardia present.   Pulmonary/Chest: Effort normal and breath sounds normal. No respiratory distress. She has no wheezes.  Musculoskeletal: Normal range of motion.  Neurological: She is alert and oriented to person, place, and time.  Skin: Skin is warm and dry.  Psychiatric: She has a normal mood and affect. Her behavior is normal.    ED Course  Procedures (including critical care time) Labs Review Labs Reviewed - No data to display Imaging Review No results found.   MDM   1. Chest pain   2. Hypertension    Malignant hypertension with associated chest pain.    [place], [place] 06/05/13 2211

## 2013-06-05 NOTE — ED Notes (Signed)
Called lab and spoke to [facility] for BMP lab add on.

## 2013-06-05 NOTE — ED Notes (Signed)
Patient is still off the unit for diagnostic testing.

## 2013-06-05 NOTE — ED Provider Notes (Signed)
CSN: [id number]     Arrival date & time 06/05/13  2112 History   First MD Initiated Contact with Patient 06/05/13 2139     Chief Complaint  Patient presents with  . Chest Pain     (Consider location/radiation/quality/duration/timing/severity/associated sxs/prior Treatment) HPI Comments: Patient presents from urgent care with chest pain onset around noon today. Pain is in the center of her chest it was constant until she went to urgent care around 9:00. She's been out of her blood pressure medicines clonidine for the past several days. She's been taking her lisinopril. She's been out of her Lopressor. She received aspirin and nitroglycerin with relief of her chest pain is not resolved. Denies any medical history other than hypertension. She denies any difficulty breathing, nausea, vomiting, abdominal pain, cough or fever. She is a former smoker. She denies any coronary disease history. She denies any headache currently. She had a headache earlier and that has since resolved.  The history is provided by the patient.    Past Medical History  Diagnosis Date  . Arthritis   . Hypertension   . Chronic back pain   . Anxiety    Past Surgical History  Procedure Laterality Date  . Cholecystectomy    . Cesarean section    . Tubal ligation     Family History  Problem Relation Age of Onset  . CAD Father   . Heart failure Brother   . Hypertension Brother   . CAD Paternal Aunt    History  Substance Use Topics  . Smoking status: Former Smoker -- 14 years    Types: Cigarettes    Quit date: 11/27/2012  . Smokeless tobacco: Not on file  . Alcohol Use: No   OB History   Grav Para Term Preterm Abortions TAB SAB Ect Mult Living                 Review of Systems  Cardiovascular: Positive for chest pain.      Allergies  Review of patient's allergies indicates no known allergies.  Home Medications   No current outpatient prescriptions on file. BP 196/123  Pulse 85  Temp(Src) 98.5  F (36.9 C) (Oral)  Resp 20  Ht 5\' 8"  (1.727 m)  Wt 332 lb 3.2 oz (150.685 kg)  BMI 50.52 kg/m2  SpO2 100%  LMP 05/24/2013 Physical Exam  Constitutional: She is oriented to person, place, and time. She appears well-developed and well-nourished. No distress.  HENT:  Head: Normocephalic and atraumatic.  Mouth/Throat: Oropharynx is clear and moist. No oropharyngeal exudate.  Eyes: Conjunctivae and EOM are normal. Pupils are equal, round, and reactive to light.  Neck: Normal range of motion. Neck supple.  No meningismus  Cardiovascular: Normal rate, regular rhythm and normal heart sounds.   No murmur heard. Pulmonary/Chest: Effort normal and breath sounds normal. No respiratory distress. She exhibits no tenderness.  Abdominal: Soft. There is no tenderness. There is no rebound and no guarding.  Musculoskeletal: Normal range of motion. She exhibits no edema and no tenderness.  Neurological: She is alert and oriented to person, place, and time. No cranial nerve deficit. She exhibits normal muscle tone. Coordination normal.  CN 2-12 intact, no ataxia on finger to nose, no nystagmus, 5/5 strength throughout, no pronator drift, Romberg negative, normal gait.   Skin: Skin is warm.    ED Course  Procedures (including critical care time) Labs Review Labs Reviewed  CBC - Abnormal; Notable for the following:    RBC 6.22 (*)  Hemoglobin 11.2 (*)    MCV 58.2 (*)    MCH 18.0 (*)    RDW 16.4 (*)    All other components within normal limits  PRO B NATRIURETIC PEPTIDE - Abnormal; Notable for the following:    Pro B Natriuretic peptide (BNP) 258.3 (*)    All other components within normal limits  D-DIMER, QUANTITATIVE  BASIC METABOLIC PANEL  TROPONIN I  TROPONIN I  TROPONIN I  COMPREHENSIVE METABOLIC PANEL  CBC WITH DIFFERENTIAL  TSH  CBC  I-STAT TROPOININ, ED   Imaging Review Dg Chest 2 View  06/05/2013   CLINICAL DATA:  Chest pain  EXAM: CHEST  2 VIEW  COMPARISON:  DG CHEST 2 VIEW  dated 03/26/2013  FINDINGS: The heart size and mediastinal contours are within normal limits. Both lungs are clear. The visualized skeletal structures are unremarkable.  IMPRESSION: No active cardiopulmonary disease.   Electronically Signed   By: Kathreen Devoid   On: 06/05/2013 23:19   Ct Head Wo Contrast  06/06/2013   CLINICAL DATA:  Headache  EXAM: CT HEAD WITHOUT CONTRAST  TECHNIQUE: Contiguous axial images were obtained from the base of the skull through the vertex without intravenous contrast.  COMPARISON:  CT C SPINE W/O CM dated 12/09/2011; CT HEAD W/O CM dated 06/25/2008; CT HEAD W/O CM dated 09/19/2007  FINDINGS: There is no evidence of mass effect, midline shift or extra-axial fluid collections. There is no evidence of a space-occupying lesion or intracranial hemorrhage. There is no evidence of a cortical-based area of acute infarction.  The ventricles and sulci are appropriate for the patient's age. The basal cisterns are patent.  Visualized portions of the orbits are unremarkable. There is mild left maxillary and ethmoid sinus mucosal thickening.  There is is thinning of the cortex with near complete attenuation involving the right temporal bone at the level of the anterior inferior right temporal fossa which is unchanged compared with 06/25/2008.  IMPRESSION: No acute intracranial pathology.   Electronically Signed   By: Kathreen Devoid   On: 06/06/2013 00:47     EKG Interpretation   Date/Time:  Monday June 05 2013 21:26:38 EDT Ventricular Rate:  88 PR Interval:  186 QRS Duration: 78 QT Interval:  375 QTC Calculation: 638 R Axis:   135 Text Interpretation:  Right and left arm electrode reversal,  interpretation assumes no reversal Sinus rhythm Consider right atrial  enlargement Probable lateral infarct, age indeterminate      MDM   Final diagnoses:  Hypertensive emergency  Chest pain   From [facility] with elevated blood pressure and chest pain.  Out of BP meds for several days.  Substernal  chest pain onset around 12, improved with NTG and ASA.  EKG nsr without ST changes.  BP >756 systolic on arrival.  IV labetalol given. Suspect rebound hypertension from clonidine abrupt stoppage.  Clonidine given.  CXR neg. CT head negative. Neuro exam nonfocal.  Troponin and D-dimer negative. Chest and back pain have improved. No back pain. Equal upper extremity blood pressures. Do not suspect dissection or PE.chest pain resolved after NTG.  Concern for hypertensive emergency given chest pain and HTN.  Some improvement with labetalol. Will admit for further evaluation.  BP 196/123  Pulse 85  Temp(Src) 98.5 F (36.9 C) (Oral)  Resp 20  Ht 5\' 8"  (1.727 m)  Wt 332 lb 3.2 oz (150.685 kg)  BMI 50.52 kg/m2  SpO2 100%  LMP 05/24/2013    Ezequiel Essex, MD 06/06/13 0300

## 2013-06-05 NOTE — ED Notes (Signed)
Reported to Dr. Wyvonnia Dusky patient's blood pressure of 207/98, with no complaints of pain.   MD acknowledges and will enter orders.

## 2013-06-05 NOTE — ED Notes (Signed)
Checking BP on both arms, left arm: 210/104, heart rate 91. Right arm: 205/167, pulse 98

## 2013-06-05 NOTE — ED Notes (Addendum)
Per EMS, patient went to urgent care because of chest pain and headache.  She reports she has been out of BP meds, lisinopril, lopressor (out for a month), and clonidine (a few days ago).  She came from urgent care.  She received 1 nitro, and reports pain level in chest 5/10.  She reports only cardiac history is hypertension. VS with EMS 214/129, Pulse 104. 18g present in right AC.

## 2013-06-05 NOTE — ED Notes (Signed)
Patient transported to X-ray 

## 2013-06-05 NOTE — ED Notes (Signed)
Patient returned from X-ray 

## 2013-06-05 NOTE — ED Notes (Signed)
C/o out of her medications x 2 days, now her chest is tight. Pain is "10" on 1-10 scale, NAD at present

## 2013-06-05 NOTE — ED Notes (Signed)
Patient reports she took 1 nitro and 4 aspirin before coming here.

## 2013-06-06 ENCOUNTER — Encounter (HOSPITAL_COMMUNITY): Payer: Self-pay | Admitting: Internal Medicine

## 2013-06-06 ENCOUNTER — Emergency Department (HOSPITAL_COMMUNITY): Payer: Medicaid Other

## 2013-06-06 DIAGNOSIS — I10 Essential (primary) hypertension: Principal | ICD-10-CM

## 2013-06-06 DIAGNOSIS — D649 Anemia, unspecified: Secondary | ICD-10-CM

## 2013-06-06 DIAGNOSIS — I160 Hypertensive urgency: Secondary | ICD-10-CM | POA: Diagnosis present

## 2013-06-06 DIAGNOSIS — R079 Chest pain, unspecified: Secondary | ICD-10-CM

## 2013-06-06 DIAGNOSIS — I161 Hypertensive emergency: Secondary | ICD-10-CM | POA: Diagnosis present

## 2013-06-06 LAB — BASIC METABOLIC PANEL
BUN: 12 mg/dL (ref 6–23)
CO2: 21 meq/L (ref 19–32)
Calcium: 9.3 mg/dL (ref 8.4–10.5)
Chloride: 101 mEq/L (ref 96–112)
Creatinine, Ser: 0.62 mg/dL (ref 0.50–1.10)
GFR calc Af Amer: >90 mL/min (ref >90)
GFR calc non Af Amer: >90 mL/min (ref >90)
GLUCOSE: 86 mg/dL (ref 70–99)
POTASSIUM: 4.1 meq/L (ref 3.7–5.3)
Sodium: 141 mEq/L (ref 137–147)

## 2013-06-06 LAB — CBC WITH DIFFERENTIAL/PLATELET
Basophils Absolute: 0.1 10*3/uL (ref 0.0–0.1)
Basophils Relative: 1 % (ref 0–1)
EOS ABS: 0.3 10*3/uL (ref 0.0–0.7)
Eosinophils Relative: 4 % (ref 0–5)
HCT: 32.2 % — ABNORMAL LOW (ref 36.0–46.0)
Hemoglobin: 9.8 g/dL — ABNORMAL LOW (ref 12.0–15.0)
LYMPHS ABS: 3 10*3/uL (ref 0.7–4.0)
Lymphocytes Relative: 39 % (ref 12–46)
MCH: 17.9 pg — AB (ref 26.0–34.0)
MCHC: 30.4 g/dL (ref 30.0–36.0)
MCV: 58.7 fL — AB (ref 78.0–100.0)
Monocytes Absolute: 0.5 10*3/uL (ref 0.1–1.0)
Monocytes Relative: 7 % (ref 3–12)
NEUTROS PCT: 49 % (ref 43–77)
Neutro Abs: 3.8 10*3/uL (ref 1.7–7.7)
Platelets: 323 10*3/uL (ref 150–400)
RBC: 5.49 MIL/uL — ABNORMAL HIGH (ref 3.87–5.11)
RDW: 16.5 % — AB (ref 11.5–15.5)
WBC: 7.7 10*3/uL (ref 4.0–10.5)

## 2013-06-06 LAB — LIPID PANEL
Cholesterol: 159 mg/dL (ref 0–200)
HDL: 53 mg/dL
LDL Cholesterol: 77 mg/dL (ref 0–99)
Total CHOL/HDL Ratio: 3 ratio
Triglycerides: 144 mg/dL
VLDL: 29 mg/dL (ref 0–40)

## 2013-06-06 LAB — URINALYSIS, ROUTINE W REFLEX MICROSCOPIC
Bilirubin Urine: NEGATIVE
Glucose, UA: NEGATIVE mg/dL
Hgb urine dipstick: NEGATIVE
Ketones, ur: NEGATIVE mg/dL
Leukocytes, UA: NEGATIVE
Nitrite: NEGATIVE
Protein, ur: 30 mg/dL — AB
Specific Gravity, Urine: 1.015 (ref 1.005–1.030)
Urobilinogen, UA: 0.2 mg/dL (ref 0.0–1.0)
pH: 7 (ref 5.0–8.0)

## 2013-06-06 LAB — COMPREHENSIVE METABOLIC PANEL
ALBUMIN: 3.3 g/dL — AB (ref 3.5–5.2)
ALK PHOS: 95 U/L (ref 39–117)
ALT: 14 U/L (ref 0–35)
AST: 17 U/L (ref 0–37)
BUN: 12 mg/dL (ref 6–23)
CHLORIDE: 100 meq/L (ref 96–112)
CO2: 22 meq/L (ref 19–32)
CREATININE: 0.57 mg/dL (ref 0.50–1.10)
Calcium: 8.6 mg/dL (ref 8.4–10.5)
GFR calc Af Amer: >90 mL/min (ref >90)
Glucose, Bld: 91 mg/dL (ref 70–99)
POTASSIUM: 4.2 meq/L (ref 3.7–5.3)
SODIUM: 138 meq/L (ref 137–147)
Total Bilirubin: 0.5 mg/dL (ref 0.3–1.2)
Total Protein: 6.8 g/dL (ref 6.0–8.3)

## 2013-06-06 LAB — HEMOGLOBIN A1C
Hgb A1c MFr Bld: 6.1 % — ABNORMAL HIGH
Mean Plasma Glucose: 128 mg/dL — ABNORMAL HIGH

## 2013-06-06 LAB — IRON AND TIBC
Iron: 330 ug/dL — ABNORMAL HIGH (ref 42–135)
Saturation Ratios: 79 % — ABNORMAL HIGH (ref 20–55)
TIBC: 416 ug/dL (ref 250–470)
UIBC: 86 ug/dL — ABNORMAL LOW (ref 125–400)

## 2013-06-06 LAB — TSH: TSH: 0.89 u[IU]/mL (ref 0.350–4.500)

## 2013-06-06 LAB — TROPONIN I
Troponin I: <0.3 ng/mL (ref <0.30)
Troponin I: <0.3 ng/mL (ref <0.30)

## 2013-06-06 LAB — URINE MICROSCOPIC-ADD ON

## 2013-06-06 MED ORDER — HYDROCHLOROTHIAZIDE 50 MG PO TABS
50.0000 mg | ORAL_TABLET | Freq: Every day | ORAL | Status: DC
  Administered 2013-06-06: 50 mg via ORAL
  Filled 2013-06-06: qty 1

## 2013-06-06 MED ORDER — CARVEDILOL 25 MG PO TABS
25.0000 mg | ORAL_TABLET | Freq: Two times a day (BID) | ORAL | Status: DC
  Administered 2013-06-06 – 2013-06-07 (×3): 25 mg via ORAL
  Filled 2013-06-06 (×5): qty 1

## 2013-06-06 MED ORDER — HYDRALAZINE HCL 25 MG PO TABS
25.0000 mg | ORAL_TABLET | Freq: Three times a day (TID) | ORAL | Status: DC
  Filled 2013-06-06 (×3): qty 1

## 2013-06-06 MED ORDER — HYDRALAZINE HCL 10 MG PO TABS
10.0000 mg | ORAL_TABLET | Freq: Three times a day (TID) | ORAL | Status: DC
  Filled 2013-06-06 (×4): qty 1

## 2013-06-06 MED ORDER — HYDRALAZINE HCL 50 MG PO TABS
50.0000 mg | ORAL_TABLET | Freq: Three times a day (TID) | ORAL | Status: DC
  Administered 2013-06-06 – 2013-06-07 (×3): 50 mg via ORAL
  Filled 2013-06-06 (×6): qty 1

## 2013-06-06 MED ORDER — LISINOPRIL 2.5 MG PO TABS
2.5000 mg | ORAL_TABLET | Freq: Every day | ORAL | Status: DC
  Administered 2013-06-07: 2.5 mg via ORAL
  Filled 2013-06-06: qty 1

## 2013-06-06 MED ORDER — LISINOPRIL 2.5 MG PO TABS
2.5000 mg | ORAL_TABLET | Freq: Every day | ORAL | Status: DC
  Administered 2013-06-06: 2.5 mg via ORAL
  Filled 2013-06-06: qty 1

## 2013-06-06 MED ORDER — PAROXETINE HCL 20 MG PO TABS
40.0000 mg | ORAL_TABLET | Freq: Every day | ORAL | Status: DC
  Administered 2013-06-06 – 2013-06-07 (×2): 40 mg via ORAL
  Filled 2013-06-06 (×2): qty 2

## 2013-06-06 MED ORDER — ASPIRIN EC 325 MG PO TBEC
325.0000 mg | DELAYED_RELEASE_TABLET | Freq: Every day | ORAL | Status: DC
  Administered 2013-06-06: 325 mg via ORAL
  Filled 2013-06-06: qty 1

## 2013-06-06 MED ORDER — TRAZODONE HCL 100 MG PO TABS
100.0000 mg | ORAL_TABLET | Freq: Every day | ORAL | Status: DC
  Administered 2013-06-06: 100 mg via ORAL
  Filled 2013-06-06 (×3): qty 1

## 2013-06-06 MED ORDER — CLONIDINE HCL 0.3 MG PO TABS
0.3000 mg | ORAL_TABLET | Freq: Three times a day (TID) | ORAL | Status: DC
  Administered 2013-06-06 – 2013-06-07 (×5): 0.3 mg via ORAL
  Filled 2013-06-06 (×7): qty 1

## 2013-06-06 MED ORDER — ENOXAPARIN SODIUM 80 MG/0.8ML ~~LOC~~ SOLN
75.0000 mg | SUBCUTANEOUS | Status: DC
  Administered 2013-06-06: 75 mg via SUBCUTANEOUS
  Filled 2013-06-06 (×2): qty 0.8

## 2013-06-06 MED ORDER — TRAMADOL HCL 50 MG PO TABS
50.0000 mg | ORAL_TABLET | Freq: Three times a day (TID) | ORAL | Status: DC | PRN
  Administered 2013-06-06 – 2013-06-07 (×2): 50 mg via ORAL
  Filled 2013-06-06 (×2): qty 1

## 2013-06-06 MED ORDER — FERROUS SULFATE 325 (65 FE) MG PO TABS
325.0000 mg | ORAL_TABLET | Freq: Every day | ORAL | Status: DC
  Administered 2013-06-06 – 2013-06-07 (×2): 325 mg via ORAL
  Filled 2013-06-06 (×2): qty 1

## 2013-06-06 MED ORDER — LABETALOL HCL 5 MG/ML IV SOLN: 10.0000 mg | INTRAVENOUS | Status: DC | PRN

## 2013-06-06 MED ORDER — LISINOPRIL 20 MG PO TABS: 20.0000 mg | ORAL_TABLET | Freq: Every day | ORAL | Status: DC

## 2013-06-06 MED ORDER — METOPROLOL SUCCINATE ER 100 MG PO TB24
100.0000 mg | ORAL_TABLET | Freq: Every day | ORAL | Status: DC
  Filled 2013-06-06: qty 1

## 2013-06-06 MED ORDER — PNEUMOCOCCAL VAC POLYVALENT 25 MCG/0.5ML IJ INJ
0.5000 mL | INJECTION | INTRAMUSCULAR | Status: DC
  Filled 2013-06-06: qty 0.5

## 2013-06-06 MED ORDER — HYDROCHLOROTHIAZIDE 25 MG PO TABS
25.0000 mg | ORAL_TABLET | Freq: Every day | ORAL | Status: DC
  Administered 2013-06-07: 25 mg via ORAL
  Filled 2013-06-06: qty 1

## 2013-06-06 NOTE — ED Provider Notes (Signed)
Medical screening examination/treatment/procedure(s) were performed by resident physician or non-physician practitioner and as supervising physician I was immediately available for consultation/collaboration.   Pauline Good MD.   Billy Fischer, MD 06/06/13 [phone]

## 2013-06-06 NOTE — Progress Notes (Addendum)
  Patient seen and examined this morning. Admission H&P reviewed. Reports some headache and soreness over her left side of the chest. Reports running out of her clonidine 2 days back and had not taken her HCTZ for some time. Patient presented with headache and chest pressure with hypertensive emergency. Risk factors include morbid obesity and active tobacco use.  Labs currently stable. Serial troponins negative. EKG unremarkable. Will check UA, lipid panel and hemoglobin A1c. Counseled on smoking cessation and weight loss. Patient reports her antidepressant causing significant weight gain. She plans to return to [facility] have [facility] medications adjusted. Counseled on medication adherence and diet compliance. Patient is looking for a new PCP in the community. -Patient cardiology recommendation. Clonidine dose increased. Added carvedilol and hydralazine and we'll adjust dose as needed.. I reduced the dose of HCTZ 25 mg daily. Since chest pain is atypical and likely musculoskeletal (reproducible on palpation) No cardiac workup needed. Chest x-ray and head CT unremarkable.  Discharge home once blood pressure stable.

## 2013-06-06 NOTE — Consult Note (Signed)
CONSULT NOTE  Date: 06/06/2013               Patient Name:  Kelly Novak MRN: [id number]  DOB: Sep 30, 1970 Age / Sex: 43 y.o., female        PCP: No PCP Per Patient Primary Cardiologist: New / Nahser            Referring Physician: Clementeen Graham, N              Reason for Consult: HTN,cp            History of Present Illness: Patient is a 43 y.o. female with a PMHx of morbid obesity, hypertension, chronic back pain, who was admitted to [facility] on 06/05/2013 for evaluation of hypertensive urgency.  The patient has had a history of hypertension and obesity for many years. She used to go to urgent ministries but has not been to the doctor rectally since a closed. She does not have regular physician.  She occasionally does an urgent care center to get her medications but this is not on a consistent basis.  She's had high blood for pressure for many years. She states that her blood pressures difficult to control even when she takes her medications. Her blood pressure goes up she has severe headache, chest ache, fatigue and shortness of breath.  She currently smokes cigarettes.  She has a strong family history of congestive heart failure and hypertension.   Medications: Outpatient medications: Prescriptions prior to admission  Medication Sig Dispense Refill  . cloNIDine (CATAPRES) 0.3 MG tablet Take 1 tablet (0.3 mg total) by mouth 2 (two) times daily.  60 tablet  4  . ferrous sulfate 325 (65 FE) MG tablet Take 1 tablet (325 mg total) by mouth daily.  30 tablet  0  . hydrochlorothiazide (HYDRODIURIL) 50 MG tablet Take 1 tablet (50 mg total) by mouth daily.  30 tablet  3  . lisinopril (PRINIVIL,ZESTRIL) 2.5 MG tablet Take 1 tablet (2.5 mg total) by mouth daily.  30 tablet  4  . metoprolol succinate (TOPROL-XL) 100 MG 24 hr tablet Take 1 tablet (100 mg total) by mouth daily. Take with or immediately following a meal.  30 tablet  0  . PARoxetine (PAXIL) 40 MG tablet Take 40 mg by mouth  every morning.      . traMADol (ULTRAM) 50 MG tablet Take 1 tablet (50 mg total) by mouth every 8 (eight) hours as needed for pain.  30 tablet  1    Current medications: Current Facility-Administered Medications  Medication Dose Route Frequency Provider Last Rate Last Dose  . aspirin EC tablet 325 mg  325 mg Oral Daily Rise Patience, MD      . cloNIDine (CATAPRES) tablet 0.3 mg  0.3 mg Oral TID Rise Patience, MD   0.3 mg at 06/06/13 0253  . enoxaparin (LOVENOX) injection 75 mg  75 mg Subcutaneous Q24H Rise Patience, MD      . ferrous sulfate tablet 325 mg  325 mg Oral Daily Rise Patience, MD      . hydrochlorothiazide (HYDRODIURIL) tablet 50 mg  50 mg Oral Daily Rise Patience, MD   50 mg at 06/06/13 0253  . labetalol (NORMODYNE,TRANDATE) injection 10 mg  10 mg Intravenous Q2H PRN Rise Patience, MD      . lisinopril (PRINIVIL,ZESTRIL) tablet 2.5 mg  2.5 mg Oral Daily Rise Patience, MD   2.5 mg at 06/06/13 0253  .  nitroGLYCERIN (NITROSTAT) SL tablet 0.4 mg  0.4 mg Sublingual Q5 min PRN Ezequiel Essex, MD   0.4 mg at 06/05/13 2128  . PARoxetine (PAXIL) tablet 40 mg  40 mg Oral Daily Rise Patience, MD      . Derrill Memo ON 06/07/2013] pneumococcal 23 valent vaccine (PNU-IMMUNE) injection 0.5 mL  0.5 mL Intramuscular Tomorrow-1000 Rise Patience, MD      . traMADol Veatrice Bourbon) tablet 50 mg  50 mg Oral Q8H PRN Rise Patience, MD   50 mg at 06/06/13 0253  . traZODone (DESYREL) tablet 100 mg  100 mg Oral QHS Rise Patience, MD         No Known Allergies   Past Medical History  Diagnosis Date  . Arthritis   . Hypertension   . Chronic back pain   . Anxiety     Past Surgical History  Procedure Laterality Date  . Cholecystectomy    . Cesarean section    . Tubal ligation      Family History  Problem Relation Age of Onset  . CAD Father   . Heart failure Brother   . Hypertension Brother   . CAD Paternal Aunt     Social History:   reports that she quit smoking about 6 months ago. Her smoking use included Cigarettes. She smoked 0.00 packs per day for 14 years. She does not have any smokeless tobacco history on file. She reports that she does not drink alcohol or use illicit drugs.   Review of Systems: Constitutional:  denies fever, chills, diaphoresis, appetite change and fatigue.  HEENT: denies photophobia, eye pain, redness, hearing loss, ear pain, congestion, sore throat, rhinorrhea, sneezing, neck pain, neck stiffness and tinnitus.  Respiratory: admits to SOB, DOE.   She denies  cough, chest tightness, and wheezing.  Cardiovascular: admits to chest pain when her BP is elevated  Gastrointestinal: denies nausea, vomiting, abdominal pain, diarrhea, constipation, blood in stool.  Genitourinary: denies dysuria, urgency, frequency, hematuria, flank pain and difficulty urinating.  Musculoskeletal: admits to  myalgias, back pain, joint swelling, arthralgias and gait problem.   Skin: denies pallor, rash and wound.  Neurological: denies dizziness, seizures, syncope, weakness, light-headedness, numbness and headaches.   Hematological: denies adenopathy, easy bruising, personal or family bleeding history.  Psychiatric/ Behavioral: denies suicidal ideation, mood changes, confusion, nervousness, sleep disturbance and agitation.    Physical Exam: BP 170/102  Pulse 76  Temp(Src) 98.6 F (37 C) (Oral)  Resp 16  Ht 5\' 8"  (1.727 m)  Wt 332 lb 3.2 oz (150.685 kg)  BMI 50.52 kg/m2  SpO2 98%  LMP 05/24/2013  Wt Readings from Last 3 Encounters:  06/06/13 332 lb 3.2 oz (150.685 kg)  09/02/12 323 lb 6.4 oz (146.693 kg)  02/21/12 315 lb (142.883 kg)    General: Vital signs reviewed and noted. She is morbidly obese  Head: Normocephalic, atraumatic, sclera anicteric,   Neck: Supple. Negative for carotid bruits. No JVD   Lungs:  Clear bilaterally, no  wheezes, rales, or rhonchi. Breathing is normal   Heart: RRR with S1 S2. No  murmurs, rubs, or gallops   Abdomen:  Soft, non-tender, morbid obesity    MSK: Strength and the appear normal for age.   Extremities: No clubbing or cyanosis. No edema.  Distal pedal pulses are 2+ and equal   Neurologic: Alert and oriented X 3. Moves all extremities spontaneously.  Psych: Responds to questions appropriately with a normal affect.     Lab results: Basic  Metabolic Panel:  Recent Labs Lab 06/05/13 2142 06/06/13 0535  NA 141 138  K 4.1 4.2  CL 101 100  CO2 21 22  GLUCOSE 86 91  BUN 12 12  CREATININE 0.62 0.57  CALCIUM 9.3 8.6    Liver Function Tests:  Recent Labs Lab 06/06/13 0535  AST 17  ALT 14  ALKPHOS 95  BILITOT 0.5  PROT 6.8  ALBUMIN 3.3*   No results found for this basename: LIPASE, AMYLASE,  in the last 168 hours No results found for this basename: AMMONIA,  in the last 168 hours  CBC:  Recent Labs Lab 06/05/13 2142 06/06/13 0535  WBC 9.0 7.7  NEUTROABS  --  3.8  HGB 11.2* 9.8*  HCT 36.2 32.2*  MCV 58.2* 58.7*  PLT 351 323    Cardiac Enzymes:  Recent Labs Lab 06/06/13 0535  TROPONINI <0.30    BNP: No components found with this basename: POCBNP,   CBG: No results found for this basename: GLUCAP,  in the last 168 hours  Coagulation Studies: No results found for this basename: LABPROT, INR,  in the last 72 hours   Other results:  EKG ;  NSR , no ST or T wave changes.    Imaging: Dg Chest 2 View  06/05/2013   CLINICAL DATA:  Chest pain  EXAM: CHEST  2 VIEW  COMPARISON:  DG CHEST 2 VIEW dated 03/26/2013  FINDINGS: The heart size and mediastinal contours are within normal limits. Both lungs are clear. The visualized skeletal structures are unremarkable.  IMPRESSION: No active cardiopulmonary disease.   Electronically Signed   By: Kathreen Devoid   On: 06/05/2013 23:19   Ct Head Wo Contrast  06/06/2013   CLINICAL DATA:  Headache  EXAM: CT HEAD WITHOUT CONTRAST  TECHNIQUE: Contiguous axial images were obtained from the base of  the skull through the vertex without intravenous contrast.  COMPARISON:  CT C SPINE W/O CM dated 12/09/2011; CT HEAD W/O CM dated 06/25/2008; CT HEAD W/O CM dated 09/19/2007  FINDINGS: There is no evidence of mass effect, midline shift or extra-axial fluid collections. There is no evidence of a space-occupying lesion or intracranial hemorrhage. There is no evidence of a cortical-based area of acute infarction.  The ventricles and sulci are appropriate for the patient's age. The basal cisterns are patent.  Visualized portions of the orbits are unremarkable. There is mild left maxillary and ethmoid sinus mucosal thickening.  There is is thinning of the cortex with near complete attenuation involving the right temporal bone at the level of the anterior inferior right temporal fossa which is unchanged compared with 06/25/2008.  IMPRESSION: No acute intracranial pathology.   Electronically Signed   By: Kathreen Devoid   On: 06/06/2013 00:47       Assessment & Plan:  1. Chest discomfort:  Her troponin levels were negative. Her pro BNP level is negative. This point I think that her chest pain is completely due to her hypertension.  There is no evidence that she has coronary artery disease.  At this point I don't think that she needs any additional testing. I do not think that a stress test would be particularly useful. She exceeds the weight limit for the nuclear table so a Myoview study is not possible. I've encouraged her to work very hard on weight loss program and low salt diet.  2. Hypertension: The patient has hypertension for the past many years. She she's been noncompliant because she has not had  a doctor and has not had easy access to medications. She'll need to get a general medical doctor and followup with her medical doctor for further management of her hypertension.  I think that she'll do better on carvedilol or perhaps labetalol then the Toprol. Will change her metoprolol to carvedilol this morning.  We'll add hydralazine 3 times a day. We'll start at 10 mg 3 times a day and titrated up to higher doses as needed.  She needs to stop smoking She needs to eat a better/ low salt  diet.   3. Morbid obesity:    Ramond Dial., MD, [facility] 06/06/2013, 9:14 AM Office - [phone] Pager [phone]

## 2013-06-06 NOTE — ED Notes (Signed)
Patient transported to CT 

## 2013-06-06 NOTE — ED Notes (Signed)
Dr. Wyvonnia Dusky at the bedside to see patient.  Reviewed blood pressure, and MD plans for admission, and to continue to hold Lopressor.

## 2013-06-06 NOTE — ED Notes (Signed)
Dr. Chyrl Civatte at the bedside with the patient.

## 2013-06-06 NOTE — H&P (Signed)
[facility] History and Physical  Kelly Novak WNI:[id number] DOB: September 08, 1970 DOA: 06/05/2013  Referring physician: ER physician. PCP: No PCP Per Patient   Chief Complaint:  Chest pain.  HPI: Kelly Novak is a 43 y.o. female with history of hypertension had gone to the urgent care at [facility] for her medication refill. She has ran out of her medications for last 2 days and has not taken any. Over at the urgent care patient complained of chest pressure and was given nitroglycerin sublingual which helped her pain ease. Patient also had brief episode of shortness of breath when she had chest pressure. Chest pressure was retrosternal nonradiating. Patient was referred to the ER. In the ER patient was found to have blood pressure systolic in more than 381 with diastolic more than 829. Patient was given labetalol IV and clonidine after which patient's blood pressure improved. Patient is the chest pain-free. Patient also has been having headache over the last 2 days. Headache is mostly on the vertex. Denies any visual symptoms or focal deficits. Denies any nausea vomiting abdominal pain diarrhea fever chills.  Review of Systems: As presented in the history of presenting illness, rest negative.  Past Medical History  Diagnosis Date  . Arthritis   . Hypertension   . Chronic back pain   . Anxiety    Past Surgical History  Procedure Laterality Date  . Cholecystectomy    . Cesarean section    . Tubal ligation     Social History:  reports that she quit smoking about 6 months ago. Her smoking use included Cigarettes. She smoked 0.00 packs per day for 14 years. She does not have any smokeless tobacco history on file. She reports that she does not drink alcohol or use illicit drugs. Where does patient live home. Can patient participate in ADLs? Yes.  No Known Allergies  Family History:  Family History  Problem Relation Age of Onset  . CAD Father   . Heart failure Brother   .  Hypertension Brother   . CAD Paternal Aunt       Prior to Admission medications   Medication Sig Start Date End Date Taking? Authorizing Provider  cloNIDine (CATAPRES) 0.3 MG tablet Take 1 tablet (0.3 mg total) by mouth 2 (two) times daily. 03/26/13  Yes Charles B. Karle Starch, MD  ferrous sulfate 325 (65 FE) MG tablet Take 1 tablet (325 mg total) by mouth daily. 10/31/12  Yes Hannah Muthersbaugh, PA-C  hydrochlorothiazide (HYDRODIURIL) 50 MG tablet Take 1 tablet (50 mg total) by mouth daily. 10/31/12  Yes Hannah Muthersbaugh, PA-C  lisinopril (PRINIVIL,ZESTRIL) 2.5 MG tablet Take 1 tablet (2.5 mg total) by mouth daily. 03/26/13  Yes Charles B. Karle Starch, MD  metoprolol succinate (TOPROL-XL) 100 MG 24 hr tablet Take 1 tablet (100 mg total) by mouth daily. Take with or immediately following a meal. 03/26/13  Yes Charles B. Karle Starch, MD  PARoxetine (PAXIL) 40 MG tablet Take 40 mg by mouth every morning.   Yes Historical Provider, MD  traMADol (ULTRAM) 50 MG tablet Take 1 tablet (50 mg total) by mouth every 8 (eight) hours as needed for pain. 10/21/12  Yes Angelica Chessman, MD    Physical Exam: Filed Vitals:   06/06/13 0000 06/06/13 0050 06/06/13 0101 06/06/13 0125  BP: 187/135 196/106 158/98 196/123  Pulse: 90 86 97 85  Temp:    98.5 F (36.9 C)  TempSrc:      Resp: 19  20 20   Height:   5'  8" (1.727 m)   Weight:   150.685 kg (332 lb 3.2 oz)   SpO2: 100% 100% 98% 100%     General:  Morbidly obese not in acute distress.  Eyes: Anicteric no pallor.  ENT: No discharge from the ears eyes nose mouth.  Neck: No mass felt.  Cardiovascular: S1-S2 heard.  Respiratory: No rhonchi or crepitations.  Abdomen: Soft nontender bowel sounds present. No guarding or rigidity.  Skin: No rash.  Musculoskeletal: No edema.  Psychiatric: Appears normal.  Neurologic: Alert awake oriented to time place and person. Moves all extremities.  Labs on Admission:  Basic Metabolic Panel:  Recent Labs Lab  06/05/13 2142  NA 141  K 4.1  CL 101  CO2 21  GLUCOSE 86  BUN 12  CREATININE 0.62  CALCIUM 9.3   Liver Function Tests: No results found for this basename: AST, ALT, ALKPHOS, BILITOT, PROT, ALBUMIN,  in the last 168 hours No results found for this basename: LIPASE, AMYLASE,  in the last 168 hours No results found for this basename: AMMONIA,  in the last 168 hours CBC:  Recent Labs Lab 06/05/13 2142  WBC 9.0  HGB 11.2*  HCT 36.2  MCV 58.2*  PLT 351   Cardiac Enzymes: No results found for this basename: CKTOTAL, CKMB, CKMBINDEX, TROPONINI,  in the last 168 hours  BNP (last 3 results)  Recent Labs  06/05/13 2142  PROBNP 258.3*   CBG: No results found for this basename: GLUCAP,  in the last 168 hours  Radiological Exams on Admission: Dg Chest 2 View  06/05/2013   CLINICAL DATA:  Chest pain  EXAM: CHEST  2 VIEW  COMPARISON:  DG CHEST 2 VIEW dated 03/26/2013  FINDINGS: The heart size and mediastinal contours are within normal limits. Both lungs are clear. The visualized skeletal structures are unremarkable.  IMPRESSION: No active cardiopulmonary disease.   Electronically Signed   By: Kathreen Devoid   On: 06/05/2013 23:19   Ct Head Wo Contrast  06/06/2013   CLINICAL DATA:  Headache  EXAM: CT HEAD WITHOUT CONTRAST  TECHNIQUE: Contiguous axial images were obtained from the base of the skull through the vertex without intravenous contrast.  COMPARISON:  CT C SPINE W/O CM dated 12/09/2011; CT HEAD W/O CM dated 06/25/2008; CT HEAD W/O CM dated 09/19/2007  FINDINGS: There is no evidence of mass effect, midline shift or extra-axial fluid collections. There is no evidence of a space-occupying lesion or intracranial hemorrhage. There is no evidence of a cortical-based area of acute infarction.  The ventricles and sulci are appropriate for the patient's age. The basal cisterns are patent.  Visualized portions of the orbits are unremarkable. There is mild left maxillary and ethmoid sinus mucosal  thickening.  There is is thinning of the cortex with near complete attenuation involving the right temporal bone at the level of the anterior inferior right temporal fossa which is unchanged compared with 06/25/2008.  IMPRESSION: No acute intracranial pathology.   Electronically Signed   By: Kathreen Devoid   On: 06/06/2013 00:47    EKG: Independently reviewed. Normal sinus rhythm.  Assessment/Plan Principal Problem:   Hypertensive emergency Active Problems:   Anemia   Smoker   Chest pain   1. Hypertensive urgency - due to patient missing her medications. Patient has been placed on when necessary IV labetalol 10 mg for systolic blood pressure more than 160. I have increased patient's clonidine to 0.3 mg 3 times a day. Continue lisinopril HCTZ and metoprolol. Patient  will eventually need workup for secondary causes of hypertension. 2. Chest pain - probably secondary to uncontrolled blood pressure. But given the history of hypertension tobacco abuse family history at this time we will cycle cardiac markers. Keep patient on nitroglycerin when necessary and aspirin. Keep patient n.p.o. at 4 AM in anticipation of possible cardiac procedures.  3. Chronic anemia - on iron pills. 4. Tobacco abuse - strongly advised to quit smoking.    Code Status: Full code.  Family Communication: Family at the bedside.  Disposition Plan: Admit to inpatient.    [facility] Pager [phone].  If 7PM-7AM, please contact night-coverage [email or web address] Password [email or web address] 06/06/2013, 2:15 AM

## 2013-06-07 MED ORDER — HYDRALAZINE HCL 50 MG PO TABS
75.0000 mg | ORAL_TABLET | Freq: Three times a day (TID) | ORAL | Status: DC
  Filled 2013-06-07 (×3): qty 1

## 2013-06-07 MED ORDER — LISINOPRIL 2.5 MG PO TABS: 2.5000 mg | ORAL_TABLET | Freq: Every day | ORAL | Status: DC

## 2013-06-07 MED ORDER — HYDROCHLOROTHIAZIDE 50 MG PO TABS: 50.0000 mg | ORAL_TABLET | Freq: Every day | ORAL | Status: DC

## 2013-06-07 MED ORDER — NICOTINE 7 MG/24HR TD PT24: 7.0000 mg | MEDICATED_PATCH | Freq: Every day | TRANSDERMAL | Status: DC

## 2013-06-07 MED ORDER — TRAMADOL HCL 50 MG PO TABS: 50.0000 mg | ORAL_TABLET | Freq: Four times a day (QID) | ORAL | Status: DC | PRN

## 2013-06-07 MED ORDER — HYDRALAZINE HCL 25 MG PO TABS: 75.0000 mg | ORAL_TABLET | Freq: Three times a day (TID) | ORAL | Status: DC

## 2013-06-07 MED ORDER — CLONIDINE HCL 0.3 MG PO TABS: 0.3000 mg | ORAL_TABLET | Freq: Three times a day (TID) | ORAL | Status: DC

## 2013-06-07 MED ORDER — TRAZODONE HCL 100 MG PO TABS: 100.0000 mg | ORAL_TABLET | Freq: Every day | ORAL | Status: DC

## 2013-06-07 MED ORDER — CARVEDILOL 25 MG PO TABS: 25.0000 mg | ORAL_TABLET | Freq: Two times a day (BID) | ORAL | Status: DC

## 2013-06-07 NOTE — Progress Notes (Signed)
Subjective: No SOB, CP.  Objective: Vital signs in last 24 hours: Temp:  [97.3 F (36.3 C)-99 F (37.2 C)] 99 F (37.2 C) (04/15 0743) Pulse Rate:  [70-82] 81 (04/15 0743) Resp:  [16-18] 18 (04/15 0743) BP: (136-180)/(66-110) 169/107 mmHg (04/15 0743) SpO2:  [98 %-100 %] 100 % (04/15 0743) Last BM Date: 06/06/13  Intake/Output from previous day: 04/14 0701 - 04/15 0700 In: 240 [P.O.:240] Out: -  Intake/Output this shift:    Medications Current Facility-Administered Medications  Medication Dose Route Frequency Provider Last Rate Last Dose  . carvedilol (COREG) tablet 25 mg  25 mg Oral BID WC Thayer Headings, MD   25 mg at 06/06/13 1759  . cloNIDine (CATAPRES) tablet 0.3 mg  0.3 mg Oral TID Rise Patience, MD   0.3 mg at 06/06/13 2238  . enoxaparin (LOVENOX) injection 75 mg  75 mg Subcutaneous Q24H Rise Patience, MD   75 mg at 06/06/13 1106  . ferrous sulfate tablet 325 mg  325 mg Oral Daily Rise Patience, MD   325 mg at 06/06/13 1106  . hydrALAZINE (APRESOLINE) tablet 50 mg  50 mg Oral 3 times per day Louellen Molder, MD   50 mg at 06/07/13 0620  . hydrochlorothiazide (HYDRODIURIL) tablet 25 mg  25 mg Oral Daily Nishant Dhungel, MD      . labetalol (NORMODYNE,TRANDATE) injection 10 mg  10 mg Intravenous Q2H PRN Rise Patience, MD      . lisinopril (PRINIVIL,ZESTRIL) tablet 2.5 mg  2.5 mg Oral Daily Nishant Dhungel, MD      . nitroGLYCERIN (NITROSTAT) SL tablet 0.4 mg  0.4 mg Sublingual Q5 min PRN Ezequiel Essex, MD   0.4 mg at 06/05/13 2128  . PARoxetine (PAXIL) tablet 40 mg  40 mg Oral Daily Rise Patience, MD   40 mg at 06/06/13 1107  . pneumococcal 23 valent vaccine (PNU-IMMUNE) injection 0.5 mL  0.5 mL Intramuscular Tomorrow-1000 Rise Patience, MD      . traMADol Veatrice Bourbon) tablet 50 mg  50 mg Oral Q8H PRN Rise Patience, MD   50 mg at 06/06/13 0253  . traZODone (DESYREL) tablet 100 mg  100 mg Oral QHS Rise Patience, MD   100 mg  at 06/06/13 2238    PE: General appearance: alert, cooperative and no distress Lungs: clear to auscultation bilaterally Heart: regular rate and rhythm, S1, S2 normal, no murmur, click, rub or gallop Extremities: No LEE Pulses: 2+ and symmetric Skin: Warm and dry. Neurologic: Grossly normal  Lab Results:   Recent Labs  06/05/13 2142 06/06/13 0535  WBC 9.0 7.7  HGB 11.2* 9.8*  HCT 36.2 32.2*  PLT 351 323   BMET  Recent Labs  06/05/13 2142 06/06/13 0535  NA 141 138  K 4.1 4.2  CL 101 100  CO2 21 22  GLUCOSE 86 91  BUN 12 12  CREATININE 0.62 0.57  CALCIUM 9.3 8.6   PT/INR No results found for this basename: LABPROT, INR,  in the last 72 hours Cholesterol  Recent Labs  06/06/13 0535  CHOL 159   Lipid Panel     Component Value Date/Time   CHOL 159 06/06/2013 0535   TRIG 144 06/06/2013 0535   HDL 53 06/06/2013 0535   CHOLHDL 3.0 06/06/2013 0535   VLDL 29 06/06/2013 0535   LDLCALC 77 06/06/2013 0535     Assessment/Plan  Patient is a 43 y.o. female with a PMHx of morbid obesity,  hypertension, chronic back pain, who was admitted to [facility] on 06/05/2013 for evaluation of hypertensive urgency.  The patient has had a history of hypertension and obesity for many years. She used to go to urgent ministries but has not been to the doctor since they closed. She does not have regular physician. She occasionally does an urgent care center to get her medications but this is not on a consistent basis.  She's had high blood for pressure for many years. She states that her blood pressures difficult to control even when she takes her medications. Her blood pressure goes up she has severe headache, chest ache, fatigue and shortness of breath.  She currently smokes cigarettes.  She has a strong family history of congestive heart failure and hypertension.   Principal Problem:   Hypertensive emergency  BB changed to Coreg yesterday.  Hydralazine added.  She's had three doses at 50mg .   Will increase to 75.  Active Problems:   Chest pain    Resolved.  Ruled out for MI.  Thought to be related to HTN.       Morbid obesity We had a long discussion about diet and eliminating the sugar from her diet.  She will need repeated coaching and follow up.   Anemia   Smoker    LOS: 2 days  Tarri Fuller PA-C 06/07/2013 9:30 AM  The patient was seen, examined and discussed with Tarri Fuller, PA-C and I agree with the above.  BP still uncontrolled, agree with increasing Hydralazine to 75 mg PO TID. If still uncontrolled BP we will add Imdur 30 mg po daily. She wants to be discharged today.  Dorothy Spark 06/07/2013

## 2013-06-07 NOTE — Care Management (Signed)
1234 06-07-13  CM did provide pt with list for PCP's accepting medicaid. No further needs from CM at this time. Ocie Cornfield [place], RN,BSN [phone]

## 2013-06-07 NOTE — Discharge Summary (Signed)
Physician Discharge Summary  [name]:[id number] DOB: [birth year] DOA: 06/05/2013  PCP: No PCP Per Patient  Admit date: 06/05/2013 Discharge date: 06/07/2013  Time spent: 35 minutes  Recommendations for Outpatient Follow-up:  1. Need further adjustment of BP medications.   Discharge Diagnoses:    Hypertensive Emergency.    Anemia   Smoker   Chest pain   Hypertensive urgency   Discharge Condition: stable.   Diet recommendation: Heart healthy  Filed Weights   06/05/13 2118 06/06/13 0101  Weight: 149.687 kg (330 lb) 150.685 kg (332 lb 3.2 oz)    History of present illness:  Kelly Novak is a 43 y.o. female with history of hypertension had gone to the urgent care at [facility] for her medication refill. She has ran out of her medications for last 2 days and has not taken any. Over at the urgent care patient complained of chest pressure and was given nitroglycerin sublingual which helped her pain ease. Patient also had brief episode of shortness of breath when she had chest pressure. Chest pressure was retrosternal nonradiating. Patient was referred to the ER. In the ER patient was found to have blood pressure systolic in more than 628 with diastolic more than 315. Patient was given labetalol IV and clonidine after which patient's blood pressure improved. Patient is the chest pain-free. Patient also has been having headache over the last 2 days. Headache is mostly on the vertex. Denies any visual symptoms or focal deficits. Denies any nausea vomiting abdominal pain diarrhea fever chills.   Hospital Course:  1-Hypertensive emergency patient presents with chest pain. BP in the 200/140. Her BP medication were adjusted. CT head negative. Troponin negative. BP has decrease to 170 /100. She will be discharge on hydralazine 75 mg TID, clonidine, coreg, HCTZ. She wants to go home.   2-Chest pain in setting of elevated BP. Has resolved.  3-Heaches; better. She relates headaches for moths.  Tramadol ordered.   Procedures:  none  Consultations:  cardiology  Discharge Exam: Filed Vitals:   06/07/13 1200  BP: 176/108  Pulse: 67  Temp: 99 F (37.2 C)  Resp: 18    General: no distress.  Cardiovascular: S 1, S 2 RRR Respiratory: CTA  Discharge Instructions You were cared for by a hospitalist during your hospital stay. If you have any questions about your discharge medications or the care you received while you were in the hospital after you are discharged, you can call the unit and asked to speak with the hospitalist on call if the hospitalist that took care of you is not available. Once you are discharged, your primary care physician will handle any further medical issues. Please note that NO REFILLS for any discharge medications will be authorized once you are discharged, as it is imperative that you return to your primary care physician (or establish a relationship with a primary care physician if you do not have one) for your aftercare needs so that they can reassess your need for medications and monitor your lab values.  Discharge Orders   Future Orders Complete By Expires   Diet - low sodium heart healthy  As directed    Increase activity slowly  As directed        Medication List    STOP taking these medications       metoprolol succinate 100 MG 24 hr tablet  Commonly known as:  TOPROL-XL      TAKE these medications  carvedilol 25 MG tablet  Commonly known as:  COREG  Take 1 tablet (25 mg total) by mouth 2 (two) times daily with a meal.     cloNIDine 0.3 MG tablet  Commonly known as:  CATAPRES  Take 1 tablet (0.3 mg total) by mouth 3 (three) times daily.     ferrous sulfate 325 (65 FE) MG tablet  Take 1 tablet (325 mg total) by mouth daily.     hydrALAZINE 25 MG tablet  Commonly known as:  APRESOLINE  Take 3 tablets (75 mg total) by mouth every 8 (eight) hours.     hydrochlorothiazide 50 MG tablet  Commonly known as:  HYDRODIURIL  Take  1 tablet (50 mg total) by mouth daily.     lisinopril 2.5 MG tablet  Commonly known as:  PRINIVIL,ZESTRIL  Take 1 tablet (2.5 mg total) by mouth daily.     PARoxetine 40 MG tablet  Commonly known as:  PAXIL  Take 40 mg by mouth every morning.     traMADol 50 MG tablet  Commonly known as:  ULTRAM  Take 1 tablet (50 mg total) by mouth every 6 (six) hours as needed.     traZODone 100 MG tablet  Commonly known as:  DESYREL  Take 1 tablet (100 mg total) by mouth at bedtime.       No Known Allergies    The results of significant diagnostics from this hospitalization (including imaging, microbiology, ancillary and laboratory) are listed below for reference.    Significant Diagnostic Studies: Dg Chest 2 View  06/05/2013   CLINICAL DATA:  Chest pain  EXAM: CHEST  2 VIEW  COMPARISON:  DG CHEST 2 VIEW dated 03/26/2013  FINDINGS: The heart size and mediastinal contours are within normal limits. Both lungs are clear. The visualized skeletal structures are unremarkable.  IMPRESSION: No active cardiopulmonary disease.   Electronically Signed   By: Kathreen Devoid   On: 06/05/2013 23:19   Ct Head Wo Contrast  06/06/2013   CLINICAL DATA:  Headache  EXAM: CT HEAD WITHOUT CONTRAST  TECHNIQUE: Contiguous axial images were obtained from the base of the skull through the vertex without intravenous contrast.  COMPARISON:  CT C SPINE W/O CM dated 12/09/2011; CT HEAD W/O CM dated 06/25/2008; CT HEAD W/O CM dated 09/19/2007  FINDINGS: There is no evidence of mass effect, midline shift or extra-axial fluid collections. There is no evidence of a space-occupying lesion or intracranial hemorrhage. There is no evidence of a cortical-based area of acute infarction.  The ventricles and sulci are appropriate for the patient's age. The basal cisterns are patent.  Visualized portions of the orbits are unremarkable. There is mild left maxillary and ethmoid sinus mucosal thickening.  There is is thinning of the cortex with near  complete attenuation involving the right temporal bone at the level of the anterior inferior right temporal fossa which is unchanged compared with 06/25/2008.  IMPRESSION: No acute intracranial pathology.   Electronically Signed   By: Kathreen Devoid   On: 06/06/2013 00:47    Microbiology: No results found for this or any previous visit (from the past 240 hour(s)).   Labs: Basic Metabolic Panel:  Recent Labs Lab 06/05/13 2142 06/06/13 0535  NA 141 138  K 4.1 4.2  CL 101 100  CO2 21 22  GLUCOSE 86 91  BUN 12 12  CREATININE 0.62 0.57  CALCIUM 9.3 8.6   Liver Function Tests:  Recent Labs Lab 06/06/13 0535  AST 17  ALT 14  ALKPHOS 95  BILITOT 0.5  PROT 6.8  ALBUMIN 3.3*   No results found for this basename: LIPASE, AMYLASE,  in the last 168 hours No results found for this basename: AMMONIA,  in the last 168 hours CBC:  Recent Labs Lab 06/05/13 2142 06/06/13 0535  WBC 9.0 7.7  NEUTROABS  --  3.8  HGB 11.2* 9.8*  HCT 36.2 32.2*  MCV 58.2* 58.7*  PLT 351 323   Cardiac Enzymes:  Recent Labs Lab 06/06/13 0535 06/06/13 1104 06/06/13 1658  TROPONINI <0.30 <0.30 <0.30   BNP: BNP (last 3 results)  Recent Labs  06/05/13 2142  PROBNP 258.3*   CBG: No results found for this basename: GLUCAP,  in the last 168 hours     Signed:  Belkys A Regalado  [facility] 06/07/2013, 12:29 PM

## 2013-06-07 NOTE — Progress Notes (Signed)
Pt provided with dc instructions and education. Pt verbalized understanding. Pt has no questions at this time. Pt provided with a list of PCP and knows to arrange hospital follow up appt. Dr Tyrell Antonio aware of elevated BP. IV removed with tip intact. Heart monitor cleaned and returned to front. Dorna Bloom, RN

## 2013-06-28 ENCOUNTER — Encounter: Payer: Self-pay | Admitting: Nurse Practitioner

## 2013-06-28 ENCOUNTER — Ambulatory Visit (INDEPENDENT_AMBULATORY_CARE_PROVIDER_SITE_OTHER): Payer: Medicaid Other | Admitting: Nurse Practitioner

## 2013-06-28 VITALS — BP 120/70 | HR 79 | Ht 67.0 in | Wt 331.0 lb

## 2013-06-28 DIAGNOSIS — Z720 Tobacco use: Secondary | ICD-10-CM

## 2013-06-28 DIAGNOSIS — I10 Essential (primary) hypertension: Secondary | ICD-10-CM

## 2013-06-28 DIAGNOSIS — F17200 Nicotine dependence, unspecified, uncomplicated: Secondary | ICD-10-CM

## 2013-06-28 DIAGNOSIS — R079 Chest pain, unspecified: Secondary | ICD-10-CM

## 2013-06-28 MED ORDER — HYDROCHLOROTHIAZIDE 50 MG PO TABS: 50.0000 mg | ORAL_TABLET | Freq: Every day | ORAL | Status: DC

## 2013-06-28 MED ORDER — LISINOPRIL 2.5 MG PO TABS: 2.5000 mg | ORAL_TABLET | Freq: Every day | ORAL | Status: DC

## 2013-06-28 MED ORDER — CARVEDILOL 25 MG PO TABS: 25.0000 mg | ORAL_TABLET | Freq: Two times a day (BID) | ORAL | Status: DC

## 2013-06-28 MED ORDER — CLONIDINE HCL 0.3 MG PO TABS: 0.3000 mg | ORAL_TABLET | Freq: Three times a day (TID) | ORAL | Status: DC

## 2013-06-28 MED ORDER — HYDRALAZINE HCL 25 MG PO TABS: 75.0000 mg | ORAL_TABLET | Freq: Three times a day (TID) | ORAL | Status: DC

## 2013-06-28 NOTE — Patient Instructions (Addendum)
We will give you information on the [employer]   Stay on your current medicines - I have sent refills to the drug store on your blood pressure medicines  Smoking cessation encouraged  We will see you back in about a month  Call the [place] office at [phone] if you have any questions, problems or concerns.

## 2013-06-28 NOTE — Progress Notes (Signed)
[place] Date of Birth: [birth year] Medical Record #[id number]  History of Present Illness: Kelly Novak is seen back today for a post hospital visit. Seen for Dr. Acie Fredrickson. She is a 43 year old female with HTN, morbid obesity, chronic back pain and tobacco abuse. Has some type of mental illness.   Most recently in the hospital with hypertensive urgency - had ran out of her medicines - BP 200/140. CT head negative. Troponin negative. BP down to 170/100 at discharge.   Comes back today. Here alone. Says she is doing better. Less chest pain. Taking her medicines but has no refills. Her hands are peeling - no real rash, no blisters. Smoking some. Diet is not ideal. No PCP.   Current Outpatient Prescriptions  Medication Sig Dispense Refill  . carvedilol (COREG) 25 MG tablet Take 1 tablet (25 mg total) by mouth 2 (two) times daily with a meal.  60 tablet  0  . cloNIDine (CATAPRES) 0.3 MG tablet Take 1 tablet (0.3 mg total) by mouth 3 (three) times daily.  90 tablet  0  . ferrous sulfate 325 (65 FE) MG tablet Take 1 tablet (325 mg total) by mouth daily.  30 tablet  0  . hydrALAZINE (APRESOLINE) 25 MG tablet Take 3 tablets (75 mg total) by mouth every 8 (eight) hours.  90 tablet  0  . hydrochlorothiazide (HYDRODIURIL) 50 MG tablet Take 1 tablet (50 mg total) by mouth daily.  30 tablet  0  . lisinopril (PRINIVIL,ZESTRIL) 2.5 MG tablet Take 1 tablet (2.5 mg total) by mouth daily.  30 tablet  0  . lurasidone (LATUDA) 40 MG TABS tablet Take 40 mg by mouth daily with breakfast.      . traMADol (ULTRAM) 50 MG tablet Take 1 tablet (50 mg total) by mouth every 6 (six) hours as needed.  30 tablet  1  . traZODone (DESYREL) 100 MG tablet Take 1 tablet (100 mg total) by mouth at bedtime.  30 tablet  0   No current facility-administered medications for this visit.    No Known Allergies  Past Medical History  Diagnosis Date  . Arthritis   . Hypertension   . Chronic back pain   . Anxiety     Past  Surgical History  Procedure Laterality Date  . Cholecystectomy    . Cesarean section    . Tubal ligation      History  Smoking status  . Current Every Day Smoker -- 14 years  . Types: Cigarettes  Smokeless tobacco  . Not on file    History  Alcohol Use No    Family History  Problem Relation Age of Onset  . CAD Father   . Heart failure Brother   . Hypertension Brother   . CAD Paternal Aunt     Review of Systems: The review of systems is per the HPI.  All other systems were reviewed and are negative.  Physical Exam: Pulse 79  Ht 5\' 7"  (1.702 m)  Wt 331 lb (150.141 kg)  BMI 51.83 kg/m2  SpO2 100%  LMP 05/24/2013 BP by me is 120/70. She has had her medicines today.  Patient is very pleasant and in no acute distress. She is morbidly obese. Hair is pink. She is quite outspoken. Skin is warm and dry. Color is normal.  HEENT is unremarkable. Normocephalic/atraumatic. PERRL. Sclera are nonicteric. Neck is supple. No masses. No JVD. Decreased breath sounds. Cardiac exam shows a regular rate and rhythm. Heart tones are  very distant. Abdomen is soft. Extremities are without edema. Gait and ROM are intact. No gross neurologic deficits noted.     LABORATORY DATA: EKG with sinus rhythm, nonspecific T wave changes.  Lab Results  Component Value Date   WBC 7.7 06/06/2013   HGB 9.8* 06/06/2013   HCT 32.2* 06/06/2013   PLT 323 06/06/2013   GLUCOSE 91 06/06/2013   CHOL 159 06/06/2013   TRIG 144 06/06/2013   HDL 53 06/06/2013   LDLCALC 77 06/06/2013   ALT 14 06/06/2013   AST 17 06/06/2013   NA 138 06/06/2013   K 4.2 06/06/2013   CL 100 06/06/2013   CREATININE 0.57 06/06/2013   BUN 12 06/06/2013   CO2 22 06/06/2013   TSH 0.890 06/06/2013   HGBA1C 6.1* 06/06/2013     Assessment / Plan: 1. HTN - BP ok. I have refilled her cardiac medicines. Try to establish with PCP. Not sure what to make of her hands peeling -she does not have a rash or blisters but this may be from her hydralazine - she  says this medicine is new - I will see her back in about 4 to 6 weeks to check on her. I have left her on her current regimen for now.   2. Obesity  3. Anemia  She does not have a primary care - will give her information on the [facility].   Patient is agreeable to this plan and will call if any problems develop in the interim.   Burtis Junes, RN, [facility] [address] [place],   [id number] [phone]

## 2013-07-12 ENCOUNTER — Inpatient Hospital Stay: Payer: Medicaid Other

## 2013-07-13 ENCOUNTER — Telehealth: Payer: Self-pay | Admitting: *Deleted

## 2013-07-13 NOTE — Telephone Encounter (Signed)
Pt walked in today and needed a note for ov on May 6th, Lori wrote note

## 2013-08-07 ENCOUNTER — Encounter (HOSPITAL_COMMUNITY): Payer: Self-pay | Admitting: Emergency Medicine

## 2013-08-07 ENCOUNTER — Emergency Department (HOSPITAL_COMMUNITY)
Admission: EM | Admit: 2013-08-07 | Discharge: 2013-08-07 | Disposition: A | Discharge status: Home / Self Care | Payer: Medicaid Other | Attending: Emergency Medicine | Admitting: Emergency Medicine

## 2013-08-07 DIAGNOSIS — F411 Generalized anxiety disorder: Secondary | ICD-10-CM | POA: Insufficient documentation

## 2013-08-07 DIAGNOSIS — I10 Essential (primary) hypertension: Secondary | ICD-10-CM | POA: Insufficient documentation

## 2013-08-07 DIAGNOSIS — G8929 Other chronic pain: Secondary | ICD-10-CM | POA: Insufficient documentation

## 2013-08-07 DIAGNOSIS — Z79899 Other long term (current) drug therapy: Secondary | ICD-10-CM | POA: Insufficient documentation

## 2013-08-07 DIAGNOSIS — Z791 Long term (current) use of non-steroidal anti-inflammatories (NSAID): Secondary | ICD-10-CM | POA: Insufficient documentation

## 2013-08-07 DIAGNOSIS — T39395A Adverse effect of other nonsteroidal anti-inflammatory drugs [NSAID], initial encounter: Secondary | ICD-10-CM

## 2013-08-07 DIAGNOSIS — K2960 Other gastritis without bleeding: Secondary | ICD-10-CM | POA: Insufficient documentation

## 2013-08-07 DIAGNOSIS — F17200 Nicotine dependence, unspecified, uncomplicated: Secondary | ICD-10-CM | POA: Insufficient documentation

## 2013-08-07 DIAGNOSIS — M129 Arthropathy, unspecified: Secondary | ICD-10-CM | POA: Insufficient documentation

## 2013-08-07 LAB — COMPREHENSIVE METABOLIC PANEL
ALBUMIN: 3.5 g/dL (ref 3.5–5.2)
ALK PHOS: 110 U/L (ref 39–117)
ALT: 18 U/L (ref 0–35)
AST: 17 U/L (ref 0–37)
BUN: 16 mg/dL (ref 6–23)
CALCIUM: 9 mg/dL (ref 8.4–10.5)
CO2: 22 mEq/L (ref 19–32)
Chloride: 103 mEq/L (ref 96–112)
Creatinine, Ser: 0.82 mg/dL (ref 0.50–1.10)
GFR calc Af Amer: >90 mL/min (ref >90)
GFR calc non Af Amer: 88 mL/min — ABNORMAL LOW (ref >90)
Glucose, Bld: 103 mg/dL — ABNORMAL HIGH (ref 70–99)
POTASSIUM: 3.7 meq/L (ref 3.7–5.3)
SODIUM: 139 meq/L (ref 137–147)
Total Bilirubin: 0.3 mg/dL (ref 0.3–1.2)
Total Protein: 6.9 g/dL (ref 6.0–8.3)

## 2013-08-07 LAB — CBC WITH DIFFERENTIAL/PLATELET
BASOS ABS: 0 10*3/uL (ref 0.0–0.1)
BASOS PCT: 0 % (ref 0–1)
EOS PCT: 4 % (ref 0–5)
Eosinophils Absolute: 0.3 10*3/uL (ref 0.0–0.7)
HEMATOCRIT: 32.3 % — AB (ref 36.0–46.0)
Hemoglobin: 9.8 g/dL — ABNORMAL LOW (ref 12.0–15.0)
LYMPHS ABS: 2.5 10*3/uL (ref 0.7–4.0)
LYMPHS PCT: 33 % (ref 12–46)
MCH: 18 pg — ABNORMAL LOW (ref 26.0–34.0)
MCHC: 30.3 g/dL (ref 30.0–36.0)
MCV: 59.3 fL — AB (ref 78.0–100.0)
MONOS PCT: 6 % (ref 3–12)
Monocytes Absolute: 0.5 10*3/uL (ref 0.1–1.0)
NEUTROS ABS: 4.2 10*3/uL (ref 1.7–7.7)
Neutrophils Relative %: 57 % (ref 43–77)
Platelets: 304 10*3/uL (ref 150–400)
RBC: 5.45 MIL/uL — AB (ref 3.87–5.11)
RDW: 19 % — AB (ref 11.5–15.5)
WBC: 7.5 10*3/uL (ref 4.0–10.5)

## 2013-08-07 LAB — LIPASE, BLOOD: Lipase: 23 U/L (ref 11–59)

## 2013-08-07 MED ORDER — HYDROMORPHONE HCL PF 1 MG/ML IJ SOLN
2.0000 mg | Freq: Once | INTRAMUSCULAR | Status: AC
  Administered 2013-08-07: 2 mg via INTRAMUSCULAR
  Filled 2013-08-07: qty 2

## 2013-08-07 MED ORDER — SUCRALFATE 1 G PO TABS
1.0000 g | ORAL_TABLET | Freq: Once | ORAL | Status: AC
  Administered 2013-08-07: 1 g via ORAL
  Filled 2013-08-07: qty 1

## 2013-08-07 MED ORDER — HYDROCODONE-ACETAMINOPHEN 5-325 MG PO TABS: 2.0000 | ORAL_TABLET | ORAL | Status: DC | PRN

## 2013-08-07 MED ORDER — SUCRALFATE 1 G PO TABS: 1.0000 g | ORAL_TABLET | Freq: Two times a day (BID) | ORAL | Status: DC

## 2013-08-07 NOTE — Discharge Instructions (Signed)
Stop ibuprophen   Gastritis, Adult Gastritis is soreness and swelling (inflammation) of the lining of the stomach. Gastritis can develop as a sudden onset (acute) or long-term (chronic) condition. If gastritis is not treated, it can lead to stomach bleeding and ulcers. CAUSES  Gastritis occurs when the stomach lining is weak or damaged. Digestive juices from the stomach then inflame the weakened stomach lining. The stomach lining may be weak or damaged due to viral or bacterial infections. One common bacterial infection is the Helicobacter pylori infection. Gastritis can also result from excessive alcohol consumption, taking certain medicines, or having too much acid in the stomach.  SYMPTOMS  In some cases, there are no symptoms. When symptoms are present, they may include:  Pain or a burning sensation in the upper abdomen.  Nausea.  Vomiting.  An uncomfortable feeling of fullness after eating. DIAGNOSIS  Your caregiver may suspect you have gastritis based on your symptoms and a physical exam. To determine the cause of your gastritis, your caregiver may perform the following:  Blood or stool tests to check for the H pylori bacterium.  Gastroscopy. A thin, flexible tube (endoscope) is passed down the esophagus and into the stomach. The endoscope has a light and camera on the end. Your caregiver uses the endoscope to view the inside of the stomach.  Taking a tissue sample (biopsy) from the stomach to examine under a microscope. TREATMENT  Depending on the cause of your gastritis, medicines may be prescribed. If you have a bacterial infection, such as an H pylori infection, antibiotics may be given. If your gastritis is caused by too much acid in the stomach, H2 blockers or antacids may be given. Your caregiver may recommend that you stop taking aspirin, ibuprofen, or other nonsteroidal anti-inflammatory drugs (NSAIDs). HOME CARE INSTRUCTIONS  Only take over-the-counter or prescription  medicines as directed by your caregiver.  If you were given antibiotic medicines, take them as directed. Finish them even if you start to feel better.  Drink enough fluids to keep your urine clear or pale yellow.  Avoid foods and drinks that make your symptoms worse, such as:  Caffeine or alcoholic drinks.  Chocolate.  Peppermint or mint flavorings.  Garlic and onions.  Spicy foods.  Citrus fruits, such as oranges, lemons, or limes.  Tomato-based foods such as sauce, chili, salsa, and pizza.  Fried and fatty foods.  Eat small, frequent meals instead of large meals. SEEK IMMEDIATE MEDICAL CARE IF:   You have black or dark red stools.  You vomit blood or material that looks like coffee grounds.  You are unable to keep fluids down.  Your abdominal pain gets worse.  You have a fever.  You do not feel better after 1 week.  You have any other questions or concerns. MAKE SURE YOU:  Understand these instructions.  Will watch your condition.  Will get help right away if you are not doing well or get worse. Document Released: 02/03/2001 Document Revised: 08/11/2011 Document Reviewed: 03/25/2011 [facility] Patient Information 2014 [place].

## 2013-08-07 NOTE — ED Notes (Signed)
Pt in c/o abd pain that feels like burning, also bilateral leg cramping, symptoms x3 days, also c/o nausea

## 2013-08-07 NOTE — ED Notes (Signed)
Pt states abdominal pain and leg cramping from fibroid tumors. Pain X 3 days.

## 2013-08-07 NOTE — ED Provider Notes (Signed)
CSN: [id number]     Arrival date & time 08/07/13  1931 History   First MD Initiated Contact with Patient 08/07/13 2256     Chief Complaint  Patient presents with  . Abdominal Pain  . Leg Pain      HPI Patient's been having burning epigastric pain for last 3 days.  Has known history of fibroid tumors and taking numerous amounts of ibuprofen in origin help the pain.  She is requesting an OB/GYN referral.  She denies any fever, chills, nausea, vomiting.  Denies diarrhea.  Past surgical history positive for cholecystectomy Past Medical History  Diagnosis Date  . Arthritis   . Hypertension   . Chronic back pain   . Anxiety    Past Surgical History  Procedure Laterality Date  . Cholecystectomy    . Cesarean section    . Tubal ligation     Family History  Problem Relation Age of Onset  . CAD Father   . Heart failure Brother   . Hypertension Brother   . CAD Paternal Aunt    History  Substance Use Topics  . Smoking status: Current Every Day Smoker -- 14 years    Types: Cigarettes  . Smokeless tobacco: Not on file  . Alcohol Use: No   OB History   Grav Para Term Preterm Abortions TAB SAB Ect Mult Living   4         2     Review of Systems All other systems reviewed and are negative   Allergies  Review of patient's allergies indicates no known allergies.  Home Medications   Prior to Admission medications   Medication Sig Start Date End Date Taking? Authorizing Provider  carvedilol (COREG) 25 MG tablet Take 1 tablet (25 mg total) by mouth 2 (two) times daily with a meal. 06/28/13  Yes Burtis Junes, NP  cloNIDine (CATAPRES) 0.3 MG tablet Take 1 tablet (0.3 mg total) by mouth 3 (three) times daily. 06/28/13  Yes Burtis Junes, NP  hydrALAZINE (APRESOLINE) 25 MG tablet Take 3 tablets (75 mg total) by mouth every 8 (eight) hours. 06/28/13  Yes Burtis Junes, NP  hydrochlorothiazide (HYDRODIURIL) 50 MG tablet Take 1 tablet (50 mg total) by mouth daily. 06/28/13  Yes Burtis Junes, NP  lisinopril (PRINIVIL,ZESTRIL) 2.5 MG tablet Take 1 tablet (2.5 mg total) by mouth daily. 06/28/13  Yes Burtis Junes, NP  lurasidone (LATUDA) 40 MG TABS tablet Take 40 mg by mouth daily with breakfast.   Yes Historical Provider, MD  traZODone (DESYREL) 100 MG tablet Take 1 tablet (100 mg total) by mouth at bedtime. 06/07/13  Yes Belkys A Regalado, MD  HYDROcodone-acetaminophen (NORCO/VICODIN) 5-325 MG per tablet Take 2 tablets by mouth every 4 (four) hours as needed. 08/07/13   Dot Lanes, MD  megestrol (MEGACE) 40 MG tablet Take 2 tablets (80 mg total) by mouth 2 (two) times daily. 08/10/13   Osborne Oman, MD  naproxen (NAPROSYN) 500 MG tablet Take 1 tablet (500 mg total) by mouth 2 (two) times daily as needed for moderate pain. 08/10/13   Osborne Oman, MD  sucralfate (CARAFATE) 1 G tablet Take 1 tablet (1 g total) by mouth 2 (two) times daily. 08/07/13   Dot Lanes, MD   BP 168/87  Pulse 88  Temp(Src) 98.9 F (37.2 C) (Oral)  Resp 21  Wt 332 lb 2 oz (150.651 kg)  SpO2 99%  LMP 07/12/2013 Physical Exam  Nursing note  and vitals reviewed. Constitutional: She is oriented to person, place, and time. She appears well-developed and well-nourished. No distress.  HENT:  Head: Normocephalic and atraumatic.  Eyes: Pupils are equal, round, and reactive to light.  Neck: Normal range of motion.  Cardiovascular: Normal rate and intact distal pulses.   Pulmonary/Chest: No respiratory distress.  Abdominal: Soft. Normal appearance and bowel sounds are normal. She exhibits no distension. There is tenderness in the epigastric area. There is no rebound and no guarding.  Musculoskeletal: Normal range of motion.  Neurological: She is alert and oriented to person, place, and time. No cranial nerve deficit.  Skin: Skin is warm and dry. No rash noted.  Psychiatric: She has a normal mood and affect. Her behavior is normal.    ED Course  Procedures (including critical care  time) Labs Review Labs Reviewed  COMPREHENSIVE METABOLIC PANEL - Abnormal; Notable for the following:    Glucose, Bld 103 (*)    GFR calc non Af Amer 88 (*)    All other components within normal limits  CBC WITH DIFFERENTIAL - Abnormal; Notable for the following:    RBC 5.45 (*)    Hemoglobin 9.8 (*)    HCT 32.3 (*)    MCV 59.3 (*)    MCH 18.0 (*)    RDW 19.0 (*)    All other components within normal limits  LIPASE, BLOOD   Medications  sucralfate (CARAFATE) tablet 1 g (1 g Oral Given 08/07/13 2323)  HYDROmorphone (DILAUDID) injection 2 mg (2 mg Intramuscular Given 08/07/13 2316)    Imaging Review No results found.    MDM   Final diagnoses:  NSAID induced gastritis        Dot Lanes, MD 08/11/13 2326

## 2013-08-10 ENCOUNTER — Encounter: Payer: Self-pay | Admitting: Obstetrics & Gynecology

## 2013-08-10 ENCOUNTER — Ambulatory Visit (INDEPENDENT_AMBULATORY_CARE_PROVIDER_SITE_OTHER): Payer: Medicaid Other | Admitting: Obstetrics & Gynecology

## 2013-08-10 ENCOUNTER — Other Ambulatory Visit (HOSPITAL_COMMUNITY)
Admission: RE | Admit: 2013-08-10 | Discharge: 2013-08-10 | Disposition: A | Discharge status: Home / Self Care | Payer: Medicaid Other | Source: Ambulatory Visit | Attending: Obstetrics & Gynecology | Admitting: Obstetrics & Gynecology

## 2013-08-10 VITALS — BP 170/96 | HR 76 | Temp 97.9°F | Ht 67.0 in | Wt 340.2 lb

## 2013-08-10 DIAGNOSIS — N926 Irregular menstruation, unspecified: Secondary | ICD-10-CM

## 2013-08-10 DIAGNOSIS — IMO0002 Reserved for concepts with insufficient information to code with codable children: Secondary | ICD-10-CM | POA: Insufficient documentation

## 2013-08-10 DIAGNOSIS — D259 Leiomyoma of uterus, unspecified: Secondary | ICD-10-CM

## 2013-08-10 DIAGNOSIS — Z113 Encounter for screening for infections with a predominantly sexual mode of transmission: Secondary | ICD-10-CM | POA: Insufficient documentation

## 2013-08-10 DIAGNOSIS — Z1151 Encounter for screening for human papillomavirus (HPV): Secondary | ICD-10-CM | POA: Insufficient documentation

## 2013-08-10 DIAGNOSIS — Z124 Encounter for screening for malignant neoplasm of cervix: Secondary | ICD-10-CM | POA: Insufficient documentation

## 2013-08-10 DIAGNOSIS — N939 Abnormal uterine and vaginal bleeding, unspecified: Secondary | ICD-10-CM

## 2013-08-10 DIAGNOSIS — Z01419 Encounter for gynecological examination (general) (routine) without abnormal findings: Secondary | ICD-10-CM

## 2013-08-10 MED ORDER — NAPROXEN 500 MG PO TABS: 500.0000 mg | ORAL_TABLET | Freq: Two times a day (BID) | ORAL | Status: DC | PRN

## 2013-08-10 MED ORDER — MEGESTROL ACETATE 40 MG PO TABS: 80.0000 mg | ORAL_TABLET | Freq: Two times a day (BID) | ORAL | Status: DC

## 2013-08-10 MED ORDER — MEGESTROL ACETATE 40 MG PO TABS: 40.0000 mg | ORAL_TABLET | Freq: Two times a day (BID) | ORAL | Status: DC

## 2013-08-10 NOTE — Progress Notes (Addendum)
    GYNECOLOGY CLINIC ANNUAL PREVENTATIVE CARE ENCOUNTER NOTE  Subjective:     Kelly Novak is a 43 y.o. G67P0 female here for evaluation of AUB in the setting of known fibroids and morbid obesity. She was seen in this clinic several years ago for the same complaints.  Does not recall when her last GYN exam was, so she needs her annual gynecologic exam.  Bleeding is described as irregular and associated with a lot of cramping and pain. Was given Norco in a recent ED visit to help with her pain.      Patient Active Problem List   Diagnosis Date Noted  . Abnormal uterine bleeding 08/10/2013  . Hypertensive emergency 06/06/2013  . Chest pain 06/06/2013  . Hypertensive urgency 06/06/2013  . Pes planus (flat feet) 09/02/2012  . Anemia 09/02/2012  . Foot pain 09/02/2012  . Smoker 09/02/2012   Gynecologic History Patient's last menstrual period was 08/06/2013. Contraception: none  Obstetric History OB History  Gravida Para Term Preterm AB SAB TAB Ectopic Multiple Living  4         2    # Outcome Date GA Lbr Len/2nd Weight Sex Delivery Anes PTL Lv  4 GRA 1997 [redacted]w[redacted]d   F LTCS   Y  3 GRA 1995 [redacted]w[redacted]d   M LTCS   Y  2 GRA 1991 [redacted]w[redacted]d   M SVD   N  1 GRA 1990 [redacted]w[redacted]d   M LTCS   N     Comments: PostPartum depression, PIH     The following portions of the patient's history were reviewed and updated as appropriate: allergies, current medications, past family history, past medical history, past social history, past surgical history and problem list.  Review of Systems Pertinent items are noted in HPI.    Objective:  BP 170/96  Pulse 76  Temp(Src) 97.9 F (36.6 C)  Ht 5\' 7"  (1.702 m)  Wt 340 lb 3.2 oz (154.314 kg)  BMI 53.27 kg/m2  LMP 08/06/2013 GENERAL: Well-developed, obese female in no acute distress.  HEENT: Normocephalic, atraumatic. Sclerae anicteric.  NECK: Supple. Normal thyroid.  LUNGS: Clear to auscultation bilaterally.  HEART: Regular rate and rhythm.  BREASTS: Large,  symmetric in size. No masses, skin changes, nipple drainage, or lymphadenopathy.  ABDOMEN: Soft, obese, nontender, nondistended. No organomegaly palpated. PELVIC: Normal external female genitalia. Vagina is pink and rugated.Moderate blood in vagina. Normal multiparous cervix contour. Pap smear obtained. Unable to palpate uterus or adnexa secondary to habitus.  Patient was unable to allow an endometrial biopsy to be done; she was crying and screaming throughout examination and asked that the procedure not be done because "I am scared".   EXTREMITIES: No cyanosis, clubbing, or edema, 2+ distal pulses.    Assessment:   Annual gynecologic examination AUB, morbid obesity, unable to do endometrial biopsy   Plan:   Pap done, will follow up results and manage accordingly. Pelvic ultrasound and mammogram ordered Patient will be scheduled for Hysteroscopy, D&C for further evaluation. She will be contacted by surgical scheduler. Meagce and Naproxen prescribed for now to help with her symptoms. Discussed differential diagnoses of AUB in detail with patient. Routine preventative health maintenance measures emphasized   Verita Schneiders, MD, [place] Attending [place], [place]

## 2013-08-10 NOTE — Progress Notes (Signed)
Unsure of last pap smear, pt is experiencing extreme pain/bleeding with periods.

## 2013-08-10 NOTE — Patient Instructions (Signed)
Dysfunctional Uterine Bleeding Normally, menstrual periods begin between ages 11 to 17 in young women. A normal menstrual cycle/period may begin every 23 days up to 35 days and lasts from 1 to 7 days. Around 12 to 14 days before your menstrual period starts, ovulation (ovary produces an egg) occurs. When counting the time between menstrual periods, count from the first day of bleeding of the previous period to the first day of bleeding of the next period. Dysfunctional (abnormal) uterine bleeding is bleeding that is different from a normal menstrual period. Your periods may come earlier or later than usual. They may be lighter, have blood clots or be heavier. You may have bleeding between periods, or you may skip one period or more. You may have bleeding after sexual intercourse, bleeding after menopause, or no menstrual period. CAUSES   Pregnancy (normal, miscarriage, tubal).  IUDs (intrauterine device, birth control).  Birth control pills.  Hormone treatment.  Menopause.  Infection of the cervix.  Blood clotting problems.  Infection of the inside lining of the uterus.  Endometriosis, inside lining of the uterus growing in the pelvis and other female organs.  Adhesions (scar tissue) inside the uterus.  Obesity or severe weight loss.  Uterine polyps inside the uterus.  Cancer of the vagina, cervix, or uterus.  Ovarian cysts or polycystic ovary syndrome.  Medical problems (diabetes, thyroid disease).  Uterine fibroids (noncancerous tumor).  Problems with your female hormones.  Endometrial hyperplasia, very thick lining and enlarged cells inside of the uterus.  Medicines that interfere with ovulation.  Radiation to the pelvis or abdomen.  Chemotherapy. DIAGNOSIS   Your doctor will discuss the history of your menstrual periods, medicines you are taking, changes in your weight, stress in your life, and any medical problems you may have.  Your doctor will do a physical  and pelvic examination.  Your doctor may want to perform certain tests to make a diagnosis, such as:  Pap test.  Blood tests.  Cultures for infection.  CT scan.  Ultrasound.  Hysteroscopy.  Laparoscopy.  MRI.  Hysterosalpingography.  D and C.  Endometrial biopsy. TREATMENT  Treatment will depend on the cause of the dysfunctional uterine bleeding (DUB). Treatment may include:  Observing your menstrual periods for a couple of months.  Prescribing medicines for medical problems, including:  Antibiotics.  Hormones.  Birth control pills.  Removing an IUD (intrauterine device, birth control).  Surgery:  D and C (scrape and remove tissue from inside the uterus).  Laparoscopy (examine inside the abdomen with a lighted tube).  Uterine ablation (destroy lining of the uterus with electrical current, laser, heat, or freezing).  Hysteroscopy (examine cervix and uterus with a lighted tube).  Hysterectomy (remove the uterus). HOME CARE INSTRUCTIONS   If medicines were prescribed, take exactly as directed. Do not change or switch medicines without consulting your caregiver.  Long term heavy bleeding may result in iron deficiency. Your caregiver may have prescribed iron pills. They help replace the iron that your body lost from heavy bleeding. Take exactly as directed.  Do not take aspirin or medicines that contain aspirin one week before or during your menstrual period. Aspirin may make the bleeding worse.  If you need to change your sanitary pad or tampon more than once every 2 hours, stay in bed with your feet elevated and a cold pack on your lower abdomen. Rest as much as possible, until the bleeding stops or slows down.  Eat well-balanced meals. Eat foods high in iron. Examples   are:  Leafy green vegetables.  Whole-grain breads and cereals.  Eggs.  Meat.  Liver.  Do not try to lose weight until the abnormal bleeding has stopped and your blood iron level is  back to normal. Do not lift more than ten pounds or do strenuous activities when you are bleeding.  For a couple of months, make note on your calendar, marking the start and ending of your period, and the type of bleeding (light, medium, heavy, spotting, clots or missed periods). This is for your caregiver to better evaluate your problem. SEEK MEDICAL CARE IF:   You develop nausea (feeling sick to your stomach) and vomiting, dizziness, or diarrhea while you are taking your medicine.  You are getting lightheaded or weak.  You have any problems that may be related to the medicine you are taking.  You develop pain with your DUB.  You want to remove your IUD.  You want to stop or change your birth control pills or hormones.  You have any type of abnormal bleeding mentioned above.  You are over 16 years old and have not had a menstrual period yet.  You are 43 years old and you are still having menstrual periods.  You have any of the symptoms mentioned above.  You develop a rash. SEEK IMMEDIATE MEDICAL CARE IF:   An oral temperature above 102 F (38.9 C) develops.  You develop chills.  You are changing your sanitary pad or tampon more than once an hour.  You develop abdominal pain.  You pass out or faint. Document Released: 02/07/2000 Document Revised: 05/04/2011 Document Reviewed: 01/08/2009 Exit[employer] Patient Information 2015 [employer]. This information is not intended to replace advice given to you by your health care Luisangel Wainright. Make sure you discuss any questions you have with your health care Falesha Schommer.  

## 2013-08-12 ENCOUNTER — Encounter (HOSPITAL_COMMUNITY): Payer: Self-pay | Admitting: Emergency Medicine

## 2013-08-12 ENCOUNTER — Emergency Department (HOSPITAL_COMMUNITY)
Admission: EM | Admit: 2013-08-12 | Discharge: 2013-08-12 | Disposition: A | Discharge status: Home / Self Care | Payer: Medicaid Other | Attending: Emergency Medicine | Admitting: Emergency Medicine

## 2013-08-12 DIAGNOSIS — F411 Generalized anxiety disorder: Secondary | ICD-10-CM | POA: Insufficient documentation

## 2013-08-12 DIAGNOSIS — N9489 Other specified conditions associated with female genital organs and menstrual cycle: Secondary | ICD-10-CM | POA: Insufficient documentation

## 2013-08-12 DIAGNOSIS — R102 Pelvic and perineal pain: Secondary | ICD-10-CM

## 2013-08-12 DIAGNOSIS — Z9089 Acquired absence of other organs: Secondary | ICD-10-CM | POA: Insufficient documentation

## 2013-08-12 DIAGNOSIS — Z9851 Tubal ligation status: Secondary | ICD-10-CM | POA: Insufficient documentation

## 2013-08-12 DIAGNOSIS — F17200 Nicotine dependence, unspecified, uncomplicated: Secondary | ICD-10-CM | POA: Insufficient documentation

## 2013-08-12 DIAGNOSIS — Z79899 Other long term (current) drug therapy: Secondary | ICD-10-CM | POA: Insufficient documentation

## 2013-08-12 DIAGNOSIS — Z9889 Other specified postprocedural states: Secondary | ICD-10-CM | POA: Insufficient documentation

## 2013-08-12 DIAGNOSIS — G8929 Other chronic pain: Secondary | ICD-10-CM | POA: Insufficient documentation

## 2013-08-12 DIAGNOSIS — M545 Low back pain, unspecified: Secondary | ICD-10-CM

## 2013-08-12 DIAGNOSIS — D259 Leiomyoma of uterus, unspecified: Secondary | ICD-10-CM | POA: Insufficient documentation

## 2013-08-12 DIAGNOSIS — M129 Arthropathy, unspecified: Secondary | ICD-10-CM | POA: Insufficient documentation

## 2013-08-12 DIAGNOSIS — Z791 Long term (current) use of non-steroidal anti-inflammatories (NSAID): Secondary | ICD-10-CM | POA: Insufficient documentation

## 2013-08-12 DIAGNOSIS — I10 Essential (primary) hypertension: Secondary | ICD-10-CM | POA: Insufficient documentation

## 2013-08-12 MED ORDER — METHOCARBAMOL 500 MG PO TABS
1000.0000 mg | ORAL_TABLET | Freq: Once | ORAL | Status: AC
  Administered 2013-08-12: 1000 mg via ORAL
  Filled 2013-08-12: qty 2

## 2013-08-12 MED ORDER — OXYCODONE-ACETAMINOPHEN 5-325 MG PO TABS: 2.0000 | ORAL_TABLET | ORAL | Status: DC | PRN

## 2013-08-12 MED ORDER — OXYCODONE-ACETAMINOPHEN 5-325 MG PO TABS
2.0000 | ORAL_TABLET | Freq: Once | ORAL | Status: AC
  Administered 2013-08-12: 2 via ORAL
  Filled 2013-08-12: qty 2

## 2013-08-12 MED ORDER — SUCRALFATE 1 G PO TABS: 1.0000 g | ORAL_TABLET | Freq: Two times a day (BID) | ORAL | Status: DC

## 2013-08-12 NOTE — ED Notes (Signed)
Pt in c/o lower abd pain and lower back pain, pt has been told she has a fibroid tumor but [facility] was not able to complete the biopsy. Denies n/v/d, pain worse with movement

## 2013-08-12 NOTE — Discharge Instructions (Signed)
Back Exercises °Back exercises help treat and prevent back injuries. The goal is to increase your strength in your belly (abdominal) and back muscles. These exercises can also help with flexibility. Start these exercises when told by your doctor. °HOME CARE °Back exercises include: °Pelvic Tilt. °· Lie on your back with your knees bent. Tilt your pelvis until the lower part of your back is against the floor. Hold this position 5 to 10 sec. Repeat this exercise 5 to 10 times. °Knee to Chest. °· Pull 1 knee up against your chest and hold for 20 to 30 seconds. Repeat this with the other knee. This may be done with the other leg straight or bent, whichever feels better. Then, pull both knees up against your chest. °Sit-Ups or Curl-Ups. °· Bend your knees 90 degrees. Start with tilting your pelvis, and do a partial, slow sit-up. Only lift your upper half 30 to 45 degrees off the floor. Take at least 2 to 3 seonds for each sit-up. Do not do sit-ups with your knees out straight. If partial sit-ups are difficult, simply do the above but with only tightening your belly (abdominal) muscles and holding it as told. °Hip-Lift. °· Lie on your back with your knees flexed 90 degrees. Push down with your feet and shoulders as you raise your hips 2 inches off the floor. Hold for 10 seconds, repeat 5 to 10 times. °Back Arches. °· Lie on your stomach. Prop yourself up on bent elbows. Slowly press on your hands, causing an arch in your low back. Repeat 3 to 5 times. °Shoulder-Lifts. °· Lie face down with arms beside your body. Keep hips and belly pressed to floor as you slowly lift your head and shoulders off the floor. °Do not overdo your exercises. Be careful in the beginning. Exercises may cause you some mild back discomfort. If the pain lasts for more than 15 minutes, stop the exercises until you see your doctor. Improvement with exercise for back problems is slow.  °Document Released: 03/14/2010 Document Revised: 05/04/2011  Document Reviewed: 12/11/2010 °ExitCare® Patient Information ©2015 [employer]. This information is not intended to replace advice given to you by your health care provider. Make sure you discuss any questions you have with your health care provider. ° °Back Pain, Adult °Back pain is very common. The pain often gets better over time. The cause of back pain is usually not dangerous. Most people can learn to manage their back pain on their own.  °HOME CARE  °· Stay active. Start with short walks on flat ground if you can. Try to walk farther each day. °· Do not sit, drive, or stand in one place for more than 30 minutes. Do not stay in bed. °· Do not avoid exercise or work. Activity can help your back heal faster. °· Be careful when you bend or lift an object. Bend at your knees, keep the object close to you, and do not twist. °· Sleep on a firm mattress. Lie on your side, and bend your knees. If you lie on your back, put a pillow under your knees. °· Only take medicines as told by your doctor. °· Put ice on the injured area. °¨ Put ice in a plastic bag. °¨ Place a towel between your skin and the bag. °¨ Leave the ice on for 15-20 minutes, 03-04 times a day for the first 2 to 3 days. After that, you can switch between ice and heat packs. °· Ask your doctor about back exercises   or massage.  Avoid feeling anxious or stressed. Find good ways to deal with stress, such as exercise. GET HELP RIGHT AWAY IF:   Your pain does not go away with rest or medicine.  Your pain does not go away in 1 week.  You have new problems.  You do not feel well.  The pain spreads into your legs.  You cannot control when you poop (bowel movement) or pee (urinate).  Your arms or legs feel weak or lose feeling (numbness).  You feel sick to your stomach (nauseous) or throw up (vomit).  You have belly (abdominal) pain.  You feel like you may pass out (faint). MAKE SURE YOU:   Understand these instructions.  Will watch  your condition.  Will get help right away if you are not doing well or get worse. Document Released: 07/29/2007 Document Revised: 05/04/2011 Document Reviewed: 06/30/2010 [facility] Patient Information 2015 [place], [place]. This information is not intended to replace advice given to you by your health care provider. Make sure you discuss any questions you have with your health care provider.  Fibroids Fibroids are lumps (tumors) that can occur any place in a woman's body. These lumps are not cancerous. Fibroids vary in size, weight, and where they grow. HOME CARE  Do not take aspirin.  Write down the number of pads or tampons you use during your period. Tell your doctor. This can help determine the best treatment for you. GET HELP RIGHT AWAY IF:  You have pain in your lower belly (abdomen) that is not helped with medicine.  You have cramps that are not helped with medicine.  You have more bleeding between or during your period.  You feel lightheaded or pass out (faint).  Your lower belly pain gets worse. MAKE SURE YOU:  Understand these instructions.  Will watch your condition.  Will get help right away if you are not doing well or get worse. Document Released: 03/14/2010 Document Revised: 05/04/2011 Document Reviewed: 03/14/2010 [facility] Patient Information 2015 [place], [place]. This information is not intended to replace advice given to you by your health care provider. Make sure you discuss any questions you have with your health care provider.  Pelvic Pain Pelvic pain is pain felt below the belly button and between your hips. It can be caused by many different things. It is important to get help right away. This is especially true for severe, sharp, or unusual pain that comes on suddenly.  HOME CARE  Only take medicine as told by your doctor.  Rest as told by your doctor.  Eat a healthy diet, such as fruits, vegetables, and lean meats.  Drink enough fluids to keep your  pee (urine) clear or pale yellow, or as told.  Avoid sex (intercourse) if it causes pain.  Apply warm or cold packs to your lower belly (abdomen). Use the type of pack that helps the pain.  Avoid situations that cause you stress.  Keep a journal to track your pain. Write down:  When the pain started.  Where it is located.  If there are things that seem to be related to the pain, such as food or your period.  Follow up with your doctor as told. GET HELP RIGHT AWAY IF:   You have heavy bleeding from the vagina.  You have more pelvic pain.  You feel lightheaded or pass out (faint).  You have chills.  You have pain when you pee or have blood in your pee.  You cannot stop having  watery poop (diarrhea).  You cannot stop throwing up (vomiting).  You have a fever or lasting symptoms for more than 3 days.  You have a fever and your symptoms suddenly get worse.  You are being physically or sexually abused.  Your medicine does not help your pain.  You have fluid (discharge) coming from your vagina that is not normal. MAKE SURE YOU:  Understand these instructions.  Will watch your condition.  Will get help if you are not doing well or get worse. Document Released: 07/29/2007 Document Revised: 08/11/2011 Document Reviewed: 06/01/2011 [facility] Patient Information 2015 [place], [place]. This information is not intended to replace advice given to you by your health care provider. Make sure you discuss any questions you have with your health care provider.

## 2013-08-12 NOTE — ED Provider Notes (Signed)
CSN: [id number]     Arrival date & time 08/12/13  0221 History   First MD Initiated Contact with Patient 08/12/13 0232     Chief Complaint  Patient presents with  . Back Pain  . Abdominal Pain     (Consider location/radiation/quality/duration/timing/severity/associated sxs/prior Treatment) HPI 43 year old female presents to emergency room with complaint of lower pelvic pain and low back pain.  Patient was seen in emergency department for upper abdominal pain earlier in the week, at that time she was diagnosed with gastritis secondary to taking too many NSAIDs.  She was placed on Carafate.  Patient has history of chronic pelvic pain thought to be due to a fibroid.  Patient was seen this week in the GYN clinic.  She did not tolerate a uterine biopsy.  Patient is scheduled for a ultrasound outpatient and will have further management based on this study.  Patient reports that she has run out of Vicodin given to her at her ED visit.  She reports she's taking the Naprosyn prescribed to her by the GYN clinic without improvement.  Patient reports she only has pain during her menses which started on Monday.  She denies any bowel or bladder incontinence, no weakness or numbness down her legs.  No dysuria, no change in her menstrual bleeding.  No other vaginal discharge.  No fevers or chills nausea vomiting or diarrhea. Past Medical History  Diagnosis Date  . Arthritis   . Hypertension   . Chronic back pain   . Anxiety    Past Surgical History  Procedure Laterality Date  . Cholecystectomy    . Cesarean section    . Tubal ligation     Family History  Problem Relation Age of Onset  . CAD Father   . Heart failure Brother   . Hypertension Brother   . CAD Paternal Aunt    History  Substance Use Topics  . Smoking status: Current Every Day Smoker -- 14 years    Types: Cigarettes  . Smokeless tobacco: Not on file  . Alcohol Use: No   OB History   Grav Para Term Preterm Abortions TAB SAB Ect  Mult Living   4         2     Review of Systems  See History of Present Illness; otherwise all other systems are reviewed and negative   Allergies  Review of patient's allergies indicates no known allergies.  Home Medications   Prior to Admission medications   Medication Sig Start Date End Date Taking? Authorizing Darla Mcdonald  carvedilol (COREG) 25 MG tablet Take 1 tablet (25 mg total) by mouth 2 (two) times daily with a meal. 06/28/13  Yes Burtis Junes, NP  cloNIDine (CATAPRES) 0.3 MG tablet Take 1 tablet (0.3 mg total) by mouth 3 (three) times daily. 06/28/13  Yes Burtis Junes, NP  hydrALAZINE (APRESOLINE) 25 MG tablet Take 3 tablets (75 mg total) by mouth every 8 (eight) hours. 06/28/13  Yes Burtis Junes, NP  hydrochlorothiazide (HYDRODIURIL) 50 MG tablet Take 1 tablet (50 mg total) by mouth daily. 06/28/13  Yes Burtis Junes, NP  lisinopril (PRINIVIL,ZESTRIL) 2.5 MG tablet Take 1 tablet (2.5 mg total) by mouth daily. 06/28/13  Yes Burtis Junes, NP  lurasidone (LATUDA) 40 MG TABS tablet Take 40 mg by mouth daily with breakfast.   Yes Historical Marites Nath, MD  megestrol (MEGACE) 40 MG tablet Take 2 tablets (80 mg total) by mouth 2 (two) times daily. 08/10/13  Yes  Osborne Oman, MD  naproxen (NAPROSYN) 500 MG tablet Take 1 tablet (500 mg total) by mouth 2 (two) times daily as needed for moderate pain. 08/10/13  Yes Osborne Oman, MD  traZODone (DESYREL) 100 MG tablet Take 1 tablet (100 mg total) by mouth at bedtime. 06/07/13  Yes Belkys A Regalado, MD  oxyCODONE-acetaminophen (PERCOCET/ROXICET) 5-325 MG per tablet Take 2 tablets by mouth every 4 (four) hours as needed for severe pain. 08/12/13   Kalman Drape, MD  sucralfate (CARAFATE) 1 G tablet Take 1 tablet (1 g total) by mouth 2 (two) times daily. 08/12/13   Kalman Drape, MD   BP 126/75  Pulse 67  Temp(Src) 98.3 F (36.8 C) (Oral)  Resp 16  Ht 5\' 7"  (1.702 m)  Wt 340 lb (154.223 kg)  BMI 53.24 kg/m2  SpO2 100%  LMP  08/06/2013 Physical Exam  Nursing note and vitals reviewed. Constitutional: She is oriented to person, place, and time. She appears well-developed and well-nourished. She appears distressed.  Morbidly obese female, uncomfortable appearing  HENT:  Head: Normocephalic and atraumatic.  Nose: Nose normal.  Mouth/Throat: Oropharynx is clear and moist.  Eyes: Conjunctivae and EOM are normal. Pupils are equal, round, and reactive to light.  Neck: Normal range of motion. Neck supple. No JVD present. No tracheal deviation present. No thyromegaly present.  Cardiovascular: Normal rate, regular rhythm, normal heart sounds and intact distal pulses.  Exam reveals no gallop and no friction rub.   No murmur heard. Pulmonary/Chest: Effort normal and breath sounds normal. No stridor. No respiratory distress. She has no wheezes. She has no rales. She exhibits no tenderness.  Abdominal: Soft. Bowel sounds are normal. She exhibits no distension and no mass. There is no tenderness. There is no rebound and no guarding.  Musculoskeletal: Normal range of motion. She exhibits tenderness (patient has tenderness in her lower back paraspinal muscles, and SI joints bilaterally.  No step-off crepitus noted). She exhibits no edema.  Lymphadenopathy:    She has no cervical adenopathy.  Neurological: She is alert and oriented to person, place, and time. She has normal reflexes. No cranial nerve deficit. She exhibits normal muscle tone. Coordination normal.  Skin: Skin is warm and dry. No rash noted. No erythema. No pallor.  Psychiatric: She has a normal mood and affect. Her behavior is normal. Judgment and thought content normal.    ED Course  Procedures (including critical care time) Labs Review Labs Reviewed - No data to display  Imaging Review No results found.   EKG Interpretation None      MDM   Final diagnoses:  Uterine fibroids  Bilateral low back pain without sciatica  Pelvic pain in female    43 year old female with ongoing pelvic pain, back pain.  Back pain may be related to the fibroids, but suspect also do to the patient's obesity, and arthritis.  Plan to start Robaxin and Percocet.  Patient has followup with GYN planned  Kalman Drape, MD 08/12/13 [phone]

## 2013-08-12 NOTE — ED Notes (Signed)
Pt states she has been seen for vaginal bleeding and pain this week and that she came here today because "it's not any better".

## 2013-08-14 ENCOUNTER — Ambulatory Visit: Payer: Medicaid Other | Admitting: Nurse Practitioner

## 2013-08-14 LAB — CYTOLOGY - PAP

## 2013-08-15 ENCOUNTER — Encounter: Payer: Self-pay | Admitting: Obstetrics & Gynecology

## 2013-08-16 ENCOUNTER — Ambulatory Visit (HOSPITAL_COMMUNITY)
Admission: RE | Admit: 2013-08-16 | Discharge: 2013-08-16 | Disposition: A | Discharge status: Home / Self Care | Payer: Medicaid Other | Source: Ambulatory Visit | Attending: Obstetrics & Gynecology | Admitting: Obstetrics & Gynecology

## 2013-08-16 ENCOUNTER — Telehealth: Payer: Self-pay

## 2013-08-16 ENCOUNTER — Encounter (HOSPITAL_COMMUNITY): Payer: Self-pay | Admitting: Pharmacist

## 2013-08-16 DIAGNOSIS — N939 Abnormal uterine and vaginal bleeding, unspecified: Secondary | ICD-10-CM

## 2013-08-16 DIAGNOSIS — D259 Leiomyoma of uterus, unspecified: Secondary | ICD-10-CM | POA: Insufficient documentation

## 2013-08-16 DIAGNOSIS — N83209 Unspecified ovarian cyst, unspecified side: Secondary | ICD-10-CM | POA: Insufficient documentation

## 2013-08-16 DIAGNOSIS — Z1231 Encounter for screening mammogram for malignant neoplasm of breast: Secondary | ICD-10-CM | POA: Insufficient documentation

## 2013-08-16 NOTE — Telephone Encounter (Signed)
Message copied by Geanie Logan on Wed Aug 16, 2013  9:53 AM ------      Message from: Verita Schneiders A      Created: Tue Aug 15, 2013  4:44 PM       ASCUS pap with negative HRHPV on 08/10/13. Repeat cotesting in one year. Please call to inform patient of results and recommendations. ------

## 2013-08-16 NOTE — Telephone Encounter (Signed)
Called patient and informed of results and recommendations. Advised patient to call 2-3 months prior to being due for annual to schedule. Patient verbalized understanding. No questions or concerns.

## 2013-08-17 ENCOUNTER — Encounter: Payer: Self-pay | Admitting: Obstetrics & Gynecology

## 2013-08-17 NOTE — Progress Notes (Signed)
Quick Note:  Attempted to call patient, but it went to voicemail. Left a message for patient; offered endometrial ablation to be added to her upcoming Hysteroscopy, D&C on 08/23/13. Will attempt to call her back soon. Left number of clinic; if she calls, she will need basic information about endometrial ablation given to her and see if she interested. If I am unable to get hold of her, I will discuss on day of surgery. OR called, tentatively added on Hydrothermal Ablation, can remove it if patient declines. Will discuss details with patient when I am able to get her on the phone.   Osborne Oman, MD 08/17/2013 4:47 PM  ______

## 2013-08-21 ENCOUNTER — Encounter (HOSPITAL_COMMUNITY)
Admission: RE | Admit: 2013-08-21 | Discharge: 2013-08-21 | Disposition: A | Discharge status: Home / Self Care | Payer: Medicaid Other | Source: Ambulatory Visit | Attending: Obstetrics & Gynecology | Admitting: Obstetrics & Gynecology

## 2013-08-21 ENCOUNTER — Encounter (HOSPITAL_COMMUNITY): Payer: Self-pay

## 2013-08-21 ENCOUNTER — Telehealth: Payer: Self-pay | Admitting: Obstetrics & Gynecology

## 2013-08-21 HISTORY — DX: Gastro-esophageal reflux disease without esophagitis: K21.9

## 2013-08-21 HISTORY — DX: Anemia, unspecified: D64.9

## 2013-08-21 HISTORY — DX: Disease of blood and blood-forming organs, unspecified: D75.9

## 2013-08-21 HISTORY — DX: Shortness of breath: R06.02

## 2013-08-21 LAB — BASIC METABOLIC PANEL
BUN: 16 mg/dL (ref 6–23)
CHLORIDE: 97 meq/L (ref 96–112)
CO2: 27 meq/L (ref 19–32)
CREATININE: 0.86 mg/dL (ref 0.50–1.10)
Calcium: 9.2 mg/dL (ref 8.4–10.5)
GFR calc Af Amer: >90 mL/min (ref >90)
GFR calc non Af Amer: 83 mL/min — ABNORMAL LOW (ref >90)
Glucose, Bld: 136 mg/dL — ABNORMAL HIGH (ref 70–99)
POTASSIUM: 3.7 meq/L (ref 3.7–5.3)
Sodium: 136 mEq/L — ABNORMAL LOW (ref 137–147)

## 2013-08-21 LAB — CBC
HEMATOCRIT: 32.9 % — AB (ref 36.0–46.0)
HEMOGLOBIN: 10.2 g/dL — AB (ref 12.0–15.0)
MCH: 18.8 pg — ABNORMAL LOW (ref 26.0–34.0)
MCHC: 31 g/dL (ref 30.0–36.0)
MCV: 60.7 fL — AB (ref 78.0–100.0)
Platelets: 319 10*3/uL (ref 150–400)
RBC: 5.42 MIL/uL — AB (ref 3.87–5.11)
RDW: 19.3 % — AB (ref 11.5–15.5)
WBC: 9.3 10*3/uL (ref 4.0–10.5)

## 2013-08-21 NOTE — Telephone Encounter (Signed)
[facility]ulty [facility] OB/GYN Attending Phone Call Documentation  Attempted to call  Kelly Novak but received a voicemail message that informed me her mailbox was full.  Will discuss possible endometrial ablation during her hysteroscopy, D&C on the day of surgery.    Verita Schneiders, MD, [place] Attending [place] for [employer], [place]

## 2013-08-21 NOTE — Pre-Procedure Instructions (Signed)
Has been on new Rx of Phenteramine for one week. Pt instructed to stop taking it until after surgery.

## 2013-08-21 NOTE — Patient Instructions (Signed)
Your procedure is scheduled on: 08/23/13   Enter through the Main Entrance at :0930 am Pick up desk phone and dial [phone] and inform us of your arrival.  Please call [phone] if you have any problems the morning of surgery.  Remember: Do not eat food after midnight:Tuesday Clear liquids until 0700 am on day of surgery   You may brush your teeth the morning of surgery.  Take these meds the morning of surgery with a sip of water:usual morning meds  DO NOT wear jewelry, eye make-up, lipstick,body lotion, or dark fingernail polish.  (Polished toes are ok) You may wear deodorant.  If you are to be admitted after surgery, leave suitcase in car until your room has been assigned. Patients discharged on the day of surgery will not be allowed to drive home. Wear loose fitting, comfortable clothes for your ride home.

## 2013-08-23 ENCOUNTER — Ambulatory Visit (HOSPITAL_COMMUNITY): Payer: Medicaid Other | Admitting: Anesthesiology

## 2013-08-23 ENCOUNTER — Encounter (HOSPITAL_COMMUNITY): Payer: Self-pay | Admitting: *Deleted

## 2013-08-23 ENCOUNTER — Ambulatory Visit (HOSPITAL_COMMUNITY)
Admission: RE | Admit: 2013-08-23 | Discharge: 2013-08-23 | Disposition: A | Discharge status: Home / Self Care | Payer: Medicaid Other | Source: Ambulatory Visit | Attending: Obstetrics & Gynecology | Admitting: Obstetrics & Gynecology

## 2013-08-23 ENCOUNTER — Encounter (HOSPITAL_COMMUNITY)
Admission: RE | Disposition: A | Discharge status: Home / Self Care | Payer: Self-pay | Source: Ambulatory Visit | Attending: Obstetrics & Gynecology

## 2013-08-23 ENCOUNTER — Encounter (HOSPITAL_COMMUNITY): Payer: Medicaid Other | Admitting: Anesthesiology

## 2013-08-23 DIAGNOSIS — N938 Other specified abnormal uterine and vaginal bleeding: Secondary | ICD-10-CM | POA: Insufficient documentation

## 2013-08-23 DIAGNOSIS — D649 Anemia, unspecified: Secondary | ICD-10-CM | POA: Insufficient documentation

## 2013-08-23 DIAGNOSIS — I10 Essential (primary) hypertension: Secondary | ICD-10-CM | POA: Insufficient documentation

## 2013-08-23 DIAGNOSIS — N949 Unspecified condition associated with female genital organs and menstrual cycle: Secondary | ICD-10-CM | POA: Insufficient documentation

## 2013-08-23 DIAGNOSIS — D759 Disease of blood and blood-forming organs, unspecified: Secondary | ICD-10-CM | POA: Insufficient documentation

## 2013-08-23 DIAGNOSIS — Z6841 Body Mass Index (BMI) 40.0 and over, adult: Secondary | ICD-10-CM | POA: Insufficient documentation

## 2013-08-23 DIAGNOSIS — N926 Irregular menstruation, unspecified: Secondary | ICD-10-CM

## 2013-08-23 DIAGNOSIS — N939 Abnormal uterine and vaginal bleeding, unspecified: Secondary | ICD-10-CM

## 2013-08-23 DIAGNOSIS — F17200 Nicotine dependence, unspecified, uncomplicated: Secondary | ICD-10-CM | POA: Insufficient documentation

## 2013-08-23 DIAGNOSIS — N925 Other specified irregular menstruation: Secondary | ICD-10-CM | POA: Insufficient documentation

## 2013-08-23 DIAGNOSIS — K219 Gastro-esophageal reflux disease without esophagitis: Secondary | ICD-10-CM | POA: Insufficient documentation

## 2013-08-23 HISTORY — PX: HYSTEROSCOPY: SHX211

## 2013-08-23 LAB — PREGNANCY, URINE: PREG TEST UR: NEGATIVE

## 2013-08-23 LAB — GLUCOSE, CAPILLARY: Glucose-Capillary: 106 mg/dL — ABNORMAL HIGH (ref 70–99)

## 2013-08-23 SURGERY — ABLATION, ENDOMETRIUM, HYSTEROSCOPIC
Anesthesia: General | Site: Uterus

## 2013-08-23 MED ORDER — FENTANYL CITRATE 0.05 MG/ML IJ SOLN
INTRAMUSCULAR | Status: DC | PRN
  Administered 2013-08-23: 100 ug via INTRAVENOUS

## 2013-08-23 MED ORDER — LIDOCAINE HCL (CARDIAC) 20 MG/ML IV SOLN
INTRAVENOUS | Status: DC | PRN
  Administered 2013-08-23: 50 mg via INTRAVENOUS

## 2013-08-23 MED ORDER — LACTATED RINGERS IV SOLN: INTRAVENOUS | Status: DC

## 2013-08-23 MED ORDER — LIDOCAINE HCL (CARDIAC) 20 MG/ML IV SOLN
INTRAVENOUS | Status: AC
  Filled 2013-08-23: qty 5

## 2013-08-23 MED ORDER — DEXAMETHASONE SODIUM PHOSPHATE 10 MG/ML IJ SOLN
INTRAMUSCULAR | Status: DC | PRN
  Administered 2013-08-23: 6 mg via INTRAVENOUS

## 2013-08-23 MED ORDER — PROPOFOL 10 MG/ML IV EMUL
INTRAVENOUS | Status: AC
  Filled 2013-08-23: qty 20

## 2013-08-23 MED ORDER — KETOROLAC TROMETHAMINE 30 MG/ML IJ SOLN: 15.0000 mg | Freq: Once | INTRAMUSCULAR | Status: DC | PRN

## 2013-08-23 MED ORDER — OXYCODONE-ACETAMINOPHEN 5-325 MG PO TABS
1.0000 | ORAL_TABLET | ORAL | Status: DC | PRN
  Administered 2013-08-23: 1 via ORAL

## 2013-08-23 MED ORDER — PROPOFOL 10 MG/ML IV BOLUS
INTRAVENOUS | Status: DC | PRN
  Administered 2013-08-23: 50 mg via INTRAVENOUS
  Administered 2013-08-23: 350 mg via INTRAVENOUS

## 2013-08-23 MED ORDER — BUPIVACAINE HCL (PF) 0.5 % IJ SOLN
INTRAMUSCULAR | Status: AC
  Filled 2013-08-23: qty 30

## 2013-08-23 MED ORDER — DEXAMETHASONE SODIUM PHOSPHATE 10 MG/ML IJ SOLN
INTRAMUSCULAR | Status: AC
  Filled 2013-08-23: qty 1

## 2013-08-23 MED ORDER — MIDAZOLAM HCL 2 MG/2ML IJ SOLN
INTRAMUSCULAR | Status: DC | PRN
  Administered 2013-08-23: 2 mg via INTRAVENOUS

## 2013-08-23 MED ORDER — LACTATED RINGERS IV SOLN
INTRAVENOUS | Status: DC
  Administered 2013-08-23 (×2): via INTRAVENOUS

## 2013-08-23 MED ORDER — BUPIVACAINE HCL (PF) 0.5 % IJ SOLN
INTRAMUSCULAR | Status: DC | PRN
  Administered 2013-08-23: 30 mL

## 2013-08-23 MED ORDER — KETOROLAC TROMETHAMINE 30 MG/ML IJ SOLN
INTRAMUSCULAR | Status: DC | PRN
  Administered 2013-08-23: 30 mg via INTRAVENOUS

## 2013-08-23 MED ORDER — SODIUM CHLORIDE 0.9 % IR SOLN
Status: DC | PRN
  Administered 2013-08-23: 3000 mL

## 2013-08-23 MED ORDER — FENTANYL CITRATE 0.05 MG/ML IJ SOLN
25.0000 ug | INTRAMUSCULAR | Status: DC | PRN
  Administered 2013-08-23 (×2): 50 ug via INTRAVENOUS

## 2013-08-23 MED ORDER — ONDANSETRON HCL 4 MG/2ML IJ SOLN
INTRAMUSCULAR | Status: AC
  Filled 2013-08-23: qty 2

## 2013-08-23 MED ORDER — FENTANYL CITRATE 0.05 MG/ML IJ SOLN
INTRAMUSCULAR | Status: AC
  Administered 2013-08-23: 50 ug via INTRAVENOUS
  Filled 2013-08-23: qty 2

## 2013-08-23 MED ORDER — OXYCODONE-ACETAMINOPHEN 5-325 MG PO TABS: 1.0000 | ORAL_TABLET | Freq: Four times a day (QID) | ORAL | Status: DC | PRN

## 2013-08-23 MED ORDER — GLYCOPYRROLATE 0.2 MG/ML IJ SOLN
INTRAMUSCULAR | Status: DC | PRN
  Administered 2013-08-23: .2 mg via INTRAVENOUS
  Administered 2013-08-23: 0.2 mg via INTRAVENOUS

## 2013-08-23 MED ORDER — MIDAZOLAM HCL 2 MG/2ML IJ SOLN
INTRAMUSCULAR | Status: AC
  Filled 2013-08-23: qty 2

## 2013-08-23 MED ORDER — OXYCODONE-ACETAMINOPHEN 5-325 MG PO TABS
ORAL_TABLET | ORAL | Status: AC
  Administered 2013-08-23: 1 via ORAL
  Filled 2013-08-23: qty 1

## 2013-08-23 MED ORDER — KETOROLAC TROMETHAMINE 30 MG/ML IJ SOLN
INTRAMUSCULAR | Status: AC
  Filled 2013-08-23: qty 1

## 2013-08-23 MED ORDER — DOCUSATE SODIUM 100 MG PO CAPS: 100.0000 mg | ORAL_CAPSULE | Freq: Two times a day (BID) | ORAL | Status: DC | PRN

## 2013-08-23 MED ORDER — ONDANSETRON HCL 4 MG/2ML IJ SOLN
INTRAMUSCULAR | Status: DC | PRN
  Administered 2013-08-23: 4 mg via INTRAVENOUS

## 2013-08-23 MED ORDER — MIDAZOLAM HCL 2 MG/2ML IJ SOLN: 0.5000 mg | Freq: Once | INTRAMUSCULAR | Status: DC | PRN

## 2013-08-23 MED ORDER — PROPOFOL 10 MG/ML IV EMUL
INTRAVENOUS | Status: AC
  Filled 2013-08-23: qty 40

## 2013-08-23 MED ORDER — FENTANYL CITRATE 0.05 MG/ML IJ SOLN
INTRAMUSCULAR | Status: AC
  Filled 2013-08-23: qty 2

## 2013-08-23 MED ORDER — PROMETHAZINE HCL 25 MG/ML IJ SOLN: 6.2500 mg | INTRAMUSCULAR | Status: DC | PRN

## 2013-08-23 MED ORDER — IBUPROFEN 600 MG PO TABS: 600.0000 mg | ORAL_TABLET | Freq: Four times a day (QID) | ORAL | Status: DC | PRN

## 2013-08-23 MED ORDER — MEPERIDINE HCL 25 MG/ML IJ SOLN: 6.2500 mg | INTRAMUSCULAR | Status: DC | PRN

## 2013-08-23 SURGICAL SUPPLY — 13 items
CLOTH BEACON ORANGE TIMEOUT ST (SAFETY) ×3 IMPLANT
CONTAINER PREFILL 10% NBF 60ML (FORM) ×6 IMPLANT
DRAPE HYSTEROSCOPY (DRAPE) ×3 IMPLANT
GLOVE BIOGEL PI IND STRL 7.0 (GLOVE) IMPLANT
GLOVE BIOGEL PI INDICATOR 7.0 (GLOVE) ×6
GLOVE ECLIPSE 7.0 STRL STRAW (GLOVE) ×3 IMPLANT
GLOVE SURG SS PI 7.0 STRL IVOR (GLOVE) ×8 IMPLANT
GOWN STRL REUS W/TWL LRG LVL3 (GOWN DISPOSABLE) ×6 IMPLANT
PACK VAGINAL MINOR WOMEN LF (CUSTOM PROCEDURE TRAY) ×3 IMPLANT
PAD OB MATERNITY 4.3X12.25 (PERSONAL CARE ITEMS) ×6 IMPLANT
SET GENESYS HTA PROCERVA (MISCELLANEOUS) ×3 IMPLANT
TOWEL OR 17X24 6PK STRL BLUE (TOWEL DISPOSABLE) ×6 IMPLANT
WATER STERILE IRR 1000ML POUR (IV SOLUTION) ×3 IMPLANT

## 2013-08-23 NOTE — Transfer of Care (Signed)
Immediate Anesthesia Transfer of Care Note  Patient: Kelly Novak  Procedure(s) Performed: Procedure(s): HYSTEROSCOPY D&C WITH HYDROTHERMAL ABLATION (N/A)  Patient Location: PACU  Anesthesia Type:General  Level of Consciousness: awake, alert  and oriented  Airway & Oxygen Therapy: Patient Spontanous Breathing and Patient connected to nasal cannula oxygen  Post-op Assessment: Report given to PACU RN and Post -op Vital signs reviewed and stable  Post vital signs: Reviewed and stable  Complications: No apparent anesthesia complications

## 2013-08-23 NOTE — Discharge Instructions (Signed)
Endometrial Ablation Endometrial ablation removes the lining of the uterus (endometrium). It is usually a same-day, outpatient treatment. Ablation helps avoid major surgery, such as surgery to remove the cervix and uterus (hysterectomy). After endometrial ablation, you will have little or no menstrual bleeding and may not be able to have children. However, if you are premenopausal, you will need to use a reliable method of birth control following the procedure because of the small chance that pregnancy can occur. There are different reasons to have this procedure, which include:  Heavy periods.  Bleeding that is causing anemia.  Irregular bleeding.  Bleeding fibroids on the lining inside the uterus if they are smaller than 3 centimeters. This procedure should not be done if:  You want children in the future.  You have severe cramps with your menstrual period.  You have precancerous or cancerous cells in your uterus.  You were recently pregnant.  You have gone through menopause.  You have had major surgery on the uterus, such as a cesarean delivery. LET [facility] CARE PROVIDER KNOW ABOUT:  Any allergies you have.  All medicines you are taking, including vitamins, herbs, eye drops, creams, and over-the-counter medicines.  Previous problems you or members of your family have had with the use of anesthetics.  Any blood disorders you have.  Previous surgeries you have had.  Medical conditions you have. RISKS AND COMPLICATIONS  Generally, this is a safe procedure. However, as with any procedure, complications can occur. Possible complications include:  Perforation of the uterus.  Bleeding.  Infection of the uterus, bladder, or vagina.  Injury to surrounding organs.  An air bubble to the lung (air embolus).  Pregnancy following the procedure.  Failure of the procedure to help the problem, requiring hysterectomy.  Decreased ability to diagnose cancer in the lining of  the uterus. BEFORE THE PROCEDURE  The lining of the uterus must be tested to make sure there is no pre-cancerous or cancer cells present.  An ultrasound may be performed to look at the size of the uterus and to check for abnormalities.  Medicines may be given to thin the lining of the uterus. PROCEDURE  During the procedure, your health care provider will use a tool called a resectoscope to help see inside your uterus. There are different ways to remove the lining of your uterus.   Radiofrequency - This method uses a radiofrequency-alternating electric current to remove the lining of the uterus.  Cryotherapy - This method uses extreme cold to freeze the lining of the uterus.  Heated-Free Liquid - This method uses heated salt (saline) solution to remove the lining of the uterus.  Microwave - This method uses high-energy microwaves to heat up the lining of the uterus to remove it.  Thermal balloon - This method involves inserting a catheter with a balloon tip into the uterus. The balloon tip is filled with heated fluid to remove the lining of the uterus. AFTER THE PROCEDURE  After your procedure, do not have sexual intercourse or insert anything into your vagina until permitted by your health care provider. After the procedure, you may experience:  Cramps.  Vaginal discharge.  Frequent urination. Document Released: 12/20/2003 Document Revised: 10/12/2012 Document Reviewed: 07/13/2012 [facility] Patient Information 2015 [place], [place]. This information is not intended to replace advice given to you by your health care provider. Make sure you discuss any questions you have with your health care provider. DISCHARGE INSTRUCTIONS: HYSTEROSCOPY / ENDOMETRIAL ABLATION The following instructions have been prepared to  help you care for yourself upon your return home.  Personal hygiene:  Use sanitary pads for vaginal drainage, not tampons.  Shower the day after your procedure.  NO tub  baths, pools or Jacuzzis for 2-3 weeks.  Wipe front to back after using the bathroom.  Activity and limitations:  Do NOT drive or operate any equipment for 24 hours. The effects of anesthesia are still present and drowsiness may result.  Do NOT rest in bed all day.  Walking is encouraged.  Walk up and down stairs slowly.  You may resume your normal activity in one to two days or as indicated by your physician. Sexual activity: NO intercourse for at least 2 weeks after the procedure, or as indicated by your Doctor.  Diet: Eat a light meal as desired this evening. You may resume your usual diet tomorrow.  Return to Work: You may resume your work activities in one to two days or as indicated by Marine scientist.  What to expect after your surgery: Expect to have vaginal bleeding/discharge for 2-3 days and spotting for up to 10 days. It is not unusual to have soreness for up to 1-2 weeks. You may have a slight burning sensation when you urinate for the first day. Mild cramps may continue for a couple of days. You may have a regular period in 2-6 weeks.  Call your doctor for any of the following:  Excessive vaginal bleeding or clotting, saturating and changing one pad every hour.  Inability to urinate 6 hours after discharge from hospital.  Pain not relieved by pain medication.  Fever of 100.4 F or greater.  Unusual vaginal discharge or odor.  Return to office _________________Call for an appointment ___________________ Patients signature: ______________________ Nurses signature ________________________  [facility] [phone]

## 2013-08-23 NOTE — Anesthesia Preprocedure Evaluation (Signed)
Anesthesia Evaluation  Patient identified by MRN, date of birth, ID band Patient awake    Reviewed: Allergy & Precautions, H&P , Patient's Chart, lab work & pertinent test results, reviewed documented beta blocker date and time   History of Anesthesia Complications Negative for: history of anesthetic complications  Airway Mallampati: III TM Distance: >3 FB Neck ROM: full    Dental   Pulmonary Current Smoker,  breath sounds clear to auscultation        Cardiovascular Exercise Tolerance: Good hypertension, Rhythm:regular Rate:Normal     Neuro/Psych negative psych ROS   GI/Hepatic GERD-  Controlled,  Endo/Other  Morbid obesity  Renal/GU      Musculoskeletal   Abdominal   Peds  Hematology  (+) Blood dyscrasia, anemia ,   Anesthesia Other Findings   Reproductive/Obstetrics                           Anesthesia Physical Anesthesia Plan  ASA: III  Anesthesia Plan: General LMA   Post-op Pain Management:    Induction:   Airway Management Planned:   Additional Equipment:   Intra-op Plan:   Post-operative Plan:   Informed Consent: I have reviewed the patients History and Physical, chart, labs and discussed the procedure including the risks, benefits and alternatives for the proposed anesthesia with the patient or authorized representative who has indicated his/her understanding and acceptance.   Dental Advisory Given  Plan Discussed with: CRNA, Surgeon and Anesthesiologist  Anesthesia Plan Comments:        position awake please Anesthesia Quick Evaluation

## 2013-08-23 NOTE — H&P (View-Only) (Signed)
    GYNECOLOGY CLINIC ANNUAL PREVENTATIVE CARE ENCOUNTER NOTE  Subjective:     Kelly Novak is a 43 y.o. G84P0 female here for evaluation of AUB in the setting of known fibroids and morbid obesity. She was seen in this clinic several years ago for the same complaints.  Does not recall when her last GYN exam was, so she needs her annual gynecologic exam.  Bleeding is described as irregular and associated with a lot of cramping and pain. Was given Norco in a recent ED visit to help with her pain.      Patient Active Problem List   Diagnosis Date Noted  . Abnormal uterine bleeding 08/10/2013  . Hypertensive emergency 06/06/2013  . Chest pain 06/06/2013  . Hypertensive urgency 06/06/2013  . Pes planus (flat feet) 09/02/2012  . Anemia 09/02/2012  . Foot pain 09/02/2012  . Smoker 09/02/2012   Gynecologic History Patient's last menstrual period was 08/06/2013. Contraception: none  Obstetric History OB History  Gravida Para Term Preterm AB SAB TAB Ectopic Multiple Living  4         2    # Outcome Date GA Lbr Len/2nd Weight Sex Delivery Anes PTL Lv  4 GRA 1997 [redacted]w[redacted]d   F LTCS   Y  3 GRA 1995 [redacted]w[redacted]d   M LTCS   Y  2 GRA 1991 [redacted]w[redacted]d   M SVD   N  1 GRA 1990 [redacted]w[redacted]d   M LTCS   N     Comments: PostPartum depression, PIH     The following portions of the patient's history were reviewed and updated as appropriate: allergies, current medications, past family history, past medical history, past social history, past surgical history and problem list.  Review of Systems Pertinent items are noted in HPI.    Objective:  BP 170/96  Pulse 76  Temp(Src) 97.9 F (36.6 C)  Ht 5\' 7"  (1.702 m)  Wt 340 lb 3.2 oz (154.314 kg)  BMI 53.27 kg/m2  LMP 08/06/2013 GENERAL: Well-developed, obese female in no acute distress.  HEENT: Normocephalic, atraumatic. Sclerae anicteric.  NECK: Supple. Normal thyroid.  LUNGS: Clear to auscultation bilaterally.  HEART: Regular rate and rhythm.  BREASTS: Large,  symmetric in size. No masses, skin changes, nipple drainage, or lymphadenopathy.  ABDOMEN: Soft, obese, nontender, nondistended. No organomegaly palpated. PELVIC: Normal external female genitalia. Vagina is pink and rugated.Moderate blood in vagina. Normal multiparous cervix contour. Pap smear obtained. Unable to palpate uterus or adnexa secondary to habitus.  Patient was unable to allow an endometrial biopsy to be done; she was crying and screaming throughout examination and asked that the procedure not be done because "I am scared".   EXTREMITIES: No cyanosis, clubbing, or edema, 2+ distal pulses.    Assessment:   Annual gynecologic examination AUB, morbid obesity, unable to do endometrial biopsy   Plan:   Pap done, will follow up results and manage accordingly. Pelvic ultrasound and mammogram ordered Patient will be scheduled for Hysteroscopy, D&C for further evaluation. She will be contacted by surgical scheduler. Meagce and Naproxen prescribed for now to help with her symptoms. Discussed differential diagnoses of AUB in detail with patient. Routine preventative health maintenance measures emphasized   Verita Schneiders, MD, [place] Attending [place], [place]

## 2013-08-23 NOTE — Interval H&P Note (Signed)
History and Physical Interval Note 08/23/2013 11:05 AM  Kelly Novak  has presented today for surgery, with the diagnosis of Dysfunctional uterine bleeding  The various methods of treatment have been discussed with the patient and family. After consideration of risks, benefits and other options for treatment, the patient has consented to   HYSTEROSCOPY D&C WITH HYDROTHERMAL ABLATION as a surgical intervention.  The patient's history has been reviewed, patient examined, no change in status, stable for surgery.  I have reviewed the patient's chart and labs.  Questions about the Hydrothermal Ablation were answered to the patient's satisfaction.  Ultrasound results reviewed.  08/16/2013   TRANSVAGINAL ULTRASOUND OF PELVIS CLINICAL DATA:  Abnormal uterine bleeding.   TECHNIQUE: Transvaginal ultrasound examination of the pelvis was performed including evaluation of the uterus, ovaries, adnexal regions, and pelvic cul-de-sac.  COMPARISON:  None.  FINDINGS: Uterus  Measurements: 10.6 x 5.2 x 5.1 cm. Multiple fibroids are identified. The largest fibroid is in the fundus measuring 2.4 x 3.3 x 2.4 cm. Within the left lateral myometrium there is a 2.8 x 2.4 x 2.5 cm fibroid.  Endometrium  Thickness: 6 mm.  No focal abnormality visualized.  Right ovary  Measurements: Not visualized.  Left ovary  Measurements: 2.6 x 2.5 x 2.3 cm. Complicated cyst within the left ovary containing a lace-like internal architecture measures 1.6 x 1.9 x 1.3 cm.  Other findings:  No free fluid  IMPRESSION: 1. Fibroid uterus. 2. Normal appearance of the endometrium. If bleeding remains unresponsive to hormonal or medical therapy, sonohysterogram should be considered for focal lesion work-up. (Ref: Radiological Reasoning: Algorithmic Workup of Abnormal Vaginal Bleeding with Endovaginal Sonography and Sonohysterography. AJR 2008; 569:V94-80) 3. Complicated cyst in the left ovary is favored to represent a hemorrhagic cyst. Short-interval follow up  ultrasound in 6-12 weeks is recommended, preferably during the week following the patient's normal menses.   Electronically Signed   By: Kerby Moors M.D.   On: 08/16/2013 15:13   To OR when ready.  Verita Schneiders, MD, [place] Attending [place], [facility]

## 2013-08-23 NOTE — Anesthesia Postprocedure Evaluation (Signed)
  Anesthesia Post Note  Patient: Kelly Novak  Procedure(s) Performed: Procedure(s) (LRB): HYSTEROSCOPY D&C WITH HYDROTHERMAL ABLATION (N/A)  Anesthesia type: GA  Patient location: PACU  Post pain: Pain level controlled  Post assessment: Post-op Vital signs reviewed  Last Vitals:  Filed Vitals:   08/23/13 1400  BP:   Pulse: 63  Temp:   Resp:     Post vital signs: Reviewed  Level of consciousness: sedated  Complications: No apparent anesthesia complications

## 2013-08-23 NOTE — Op Note (Signed)
PREOPERATIVE DIAGNOSIS:  Abnormal uterine bleeding POSTOPERATIVE DIAGNOSIS: The same PROCEDURE: Dilation and Curettage, Hysteroscopy, Hydrothermal Endometrial Ablation SURGEON:  Dr. Verita Schneiders  INDICATIONS: 43 y.o. [id number] here for scheduled surgery for abnormal uterine bleeding. Risks of surgery were discussed with the patient including but not limited to: bleeding; infection which may require antibiotics; injury to uterus leading to risk of injury to surrounding intraperitoneal organs, burn injury to vagina or other organs, need for additional procedures including laparoscopy or laparotomy, and other postoperative/anesthesia complications. Patient was informed that there is a high likelihood of success of controlling her symptoms; however about 5% of patients may require further intervention.  Written informed consent was obtained.    FINDINGS:  A 11 week size uterus.  Diffuse proliferative endometrium.  Normal ostia bilaterally.  ANESTHESIA:   General, paracervical block. INTRAVENOUS FLUIDS:  1000 ml of LR ESTIMATED BLOOD LOSS:  10 ml SPECIMENS: Endometrial curettings sent to pathology COMPLICATIONS:  None immediate.  PROCEDURE DETAILS:  The patient was then taken to the operating room where general anesthesia was administered and was found to be adequate.  After an adequate timeout was performed, she was placed in the dorsal lithotomy position and examined; then prepped and draped in the sterile manner.   Her bladder was catheterized for clear, yellow urine. A speculum was then placed in the patient's vagina and a single tooth tenaculum was applied to the anterior lip of the cervix.   A paracervical block using 30 ml of 0.5% Marcaine was administered.  The cervix was sounded to 11 cm and dilated manually with metal dilators to accommodate the hydrothermal ablation hysteroscopic apparatus.  Once the cervix was dilated, a sharp curettage was then performed to obtain a moderate amount of  endometrial curettings.  The hysteroscope was inserted under direct visualization using normal saline as a suspension medium.  The uterine cavity was carefully examined, both ostia were recognized, and diffusely proliferative endometrium was noted.   The hydrothermal ablation was then carried out as per protocol.   Complete ablation of the endometrium was observed and the hysteroscope was removed under direct visualization. .  No complications were observed.  The tenaculum was removed from the anterior lip of the cervix, and the vaginal speculum was removed after noting good hemostasis.  The patient tolerated the procedure well and was taken to the recovery area awake, extubated and in stable condition.  The patient will be discharged to home as per PACU criteria.  Routine postoperative instructions given.  She was prescribed Percocet, Ibuprofen and Colace.  She will follow up in the clinic on 08/19/13  for postoperative evaluation.   Verita Schneiders, MD, [place] Attending [place], [facility]

## 2013-08-24 ENCOUNTER — Encounter (HOSPITAL_COMMUNITY): Payer: Self-pay | Admitting: Obstetrics & Gynecology

## 2013-08-28 ENCOUNTER — Telehealth: Payer: Self-pay | Admitting: *Deleted

## 2013-08-28 NOTE — Telephone Encounter (Signed)
Message copied by Samuel Germany on Mon Aug 28, 2013  9:48 AM ------      Message from: Verita Schneiders A      Created: Thu Aug 24, 2013 10:56 PM       Benign endometrial pathology. Please call to inform patient of results.       ------

## 2013-08-28 NOTE — Telephone Encounter (Signed)
Called Sharine and gave her the results negative( benign) endometrial pathology. No questions voiced.

## 2013-09-18 ENCOUNTER — Ambulatory Visit: Payer: Medicaid Other | Admitting: Obstetrics & Gynecology

## 2013-10-02 ENCOUNTER — Encounter: Payer: Self-pay | Admitting: Nurse Practitioner

## 2013-10-02 ENCOUNTER — Ambulatory Visit (INDEPENDENT_AMBULATORY_CARE_PROVIDER_SITE_OTHER): Payer: Medicaid Other | Admitting: Nurse Practitioner

## 2013-10-02 VITALS — HR 79 | Ht 67.5 in | Wt 330.4 lb

## 2013-10-02 DIAGNOSIS — Z720 Tobacco use: Secondary | ICD-10-CM

## 2013-10-02 DIAGNOSIS — I10 Essential (primary) hypertension: Secondary | ICD-10-CM

## 2013-10-02 DIAGNOSIS — F17200 Nicotine dependence, unspecified, uncomplicated: Secondary | ICD-10-CM

## 2013-10-02 NOTE — Progress Notes (Signed)
[place] Date of Birth: 09/20/1970 Medical Record #[id number]  History of Present Illness: Kelly Novak is seen back today for a 3 month check. Seen for Dr. Acie Fredrickson. She has HTN, morbid obesity, chronic back pain and ongoing tobacco abuse along with some type of mental illness -  Bipolar illness.   Seen back in May after being hospitalized for a hypertensive urgency - had run out of her medicines - CT head negative. Got put back on her medicines.  When I saw her back she was doing better. Tried to establish with PCP. Had some peeling of her hands ?hydralazine related.  Comes in today. Here alone.  Lots of stress - son apparently died in prison back in 06-19-2010 - she is going thru the legal system to file a complaint and having to bring back lots of bad memories/guilt. Wants off her psyche meds due to weight - seeing psyche later this month. She is seeing primary care. Has been given weight loss pills. No more chest pain. BP ok. She is taking her medicines.   Current Outpatient Prescriptions  Medication Sig Dispense Refill  . carvedilol (COREG) 25 MG tablet Take 1 tablet (25 mg total) by mouth 2 (two) times daily with a meal.  60 tablet  6  . cloNIDine (CATAPRES) 0.3 MG tablet Take 1 tablet (0.3 mg total) by mouth 3 (three) times daily.  90 tablet  6  . hydrALAZINE (APRESOLINE) 25 MG tablet Take 3 tablets (75 mg total) by mouth every 8 (eight) hours.  90 tablet  6  . hydrochlorothiazide (HYDRODIURIL) 50 MG tablet Take 1 tablet (50 mg total) by mouth daily.  30 tablet  6  . ibuprofen (ADVIL,MOTRIN) 600 MG tablet Take 1 tablet (600 mg total) by mouth every 6 (six) hours as needed.  30 tablet  1  . lisinopril (PRINIVIL,ZESTRIL) 2.5 MG tablet Take 1 tablet (2.5 mg total) by mouth daily.  30 tablet  6  . megestrol (MEGACE) 40 MG tablet Take 2 tablets (80 mg total) by mouth 2 (two) times daily.  120 tablet  5  . naproxen (NAPROSYN) 500 MG tablet Take 1 tablet (500 mg total) by mouth 2 (two) times daily  as needed for moderate pain.  60 tablet  2  . omeprazole (PRILOSEC) 20 MG capsule Take 20 mg by mouth daily.      Marland Kitchen oxyCODONE-acetaminophen (PERCOCET/ROXICET) 5-325 MG per tablet Take 2 tablets by mouth every 4 (four) hours as needed for severe pain.  20 tablet  0  . PARoxetine (PAXIL) 20 MG tablet Take 20 mg by mouth daily.      . phentermine 37.5 MG capsule Take 37.5 mg by mouth every morning.      . sucralfate (CARAFATE) 1 G tablet Take 1 tablet (1 g total) by mouth 2 (two) times daily.  30 tablet  0  . traZODone (DESYREL) 100 MG tablet Take 1 tablet (100 mg total) by mouth at bedtime.  30 tablet  0   No current facility-administered medications for this visit.    No Known Allergies  Past Medical History  Diagnosis Date  . Arthritis   . Hypertension   . Chronic back pain   . Anxiety   . GERD (gastroesophageal reflux disease)   . Shortness of breath     on exertion due to weight  . Blood dyscrasia     sickle cell trait  . Anemia     Past Surgical History  Procedure Laterality Date  .  Cholecystectomy    . Cesarean section    . Tubal ligation    . Hysteroscopy N/A 08/23/2013    Procedure: HYSTEROSCOPY D&C WITH HYDROTHERMAL ABLATION;  Surgeon: Osborne Oman, MD;  Location: [facility];  Service: Gynecology;  Laterality: N/A;    History  Smoking status  . Current Every Day Smoker -- 0.50 packs/day for 14 years  . Types: Cigarettes  Smokeless tobacco  . Not on file    History  Alcohol Use No    Family History  Problem Relation Age of Onset  . CAD Father   . Heart failure Brother   . Hypertension Brother   . CAD Paternal Aunt     Review of Systems: The review of systems is per the HPI.  All other systems were reviewed and are negative.  Physical Exam: Pulse 79  Ht 5' 7.5" (1.715 m)  Wt 330 lb 6.4 oz (149.868 kg)  BMI 50.95 kg/m2  SpO2 100% BP is 120 palpable by me. Patient is very pleasant and in no acute distress. Morbidly obese but she has lost weight. Skin  is warm and dry. Color is normal.  HEENT is unremarkable. Normocephalic/atraumatic. PERRL. Sclera are nonicteric. Neck is supple. No masses. No JVD. Lungs are clear - decreased breath sounds. Cardiac exam shows a regular rate and rhythm. Heart tones distant. Abdomen is soft. Extremities are without edema. Gait and ROM are intact. No gross neurologic deficits noted.  Wt Readings from Last 3 Encounters:  10/02/13 330 lb 6.4 oz (149.868 kg)  08/21/13 335 lb (151.955 kg)  08/12/13 340 lb (154.223 kg)    LABORATORY DATA/PROCEDURES:  Lab Results  Component Value Date   WBC 9.3 08/21/2013   HGB 10.2* 08/21/2013   HCT 32.9* 08/21/2013   PLT 319 08/21/2013   GLUCOSE 136* 08/21/2013   CHOL 159 06/06/2013   TRIG 144 06/06/2013   HDL 53 06/06/2013   LDLCALC 77 06/06/2013   ALT 18 08/07/2013   AST 17 08/07/2013   NA 136* 08/21/2013   K 3.7 08/21/2013   CL 97 08/21/2013   CREATININE 0.86 08/21/2013   BUN 16 08/21/2013   CO2 27 08/21/2013   TSH 0.890 06/06/2013   HGBA1C 6.1* 06/06/2013    BNP (last 3 results)  Recent Labs  06/05/13 2142  PROBNP 258.3*     Assessment / Plan: 1. HTN - BP ok by me today at 120 palpated. I have left her on her current regimen. See back in 4 months.  2. Obesity - on phentermine - weight is down. Would use cautiously.   3. Anemia - has had uterine fibroids with bleeding - s/p hysteroscopy.   4. Chest pain - resolved.  5. Tobacco abuse - not ready to stop  Patient is agreeable to this plan and will call if any problems develop in the interim.   Burtis Junes, RN, [place] [address] [place], [place]  [phone]

## 2013-10-02 NOTE — Patient Instructions (Signed)
Stay on your current medicines  I will see you in 4 months  Call the [place] office at [phone] if you have any questions, problems or concerns.

## 2013-12-15 ENCOUNTER — Encounter (HOSPITAL_COMMUNITY): Payer: Self-pay | Admitting: Emergency Medicine

## 2013-12-15 ENCOUNTER — Emergency Department (HOSPITAL_COMMUNITY): Payer: Medicaid Other

## 2013-12-15 ENCOUNTER — Emergency Department (HOSPITAL_COMMUNITY)
Admission: EM | Admit: 2013-12-15 | Discharge: 2013-12-15 | Disposition: A | Discharge status: Home / Self Care | Payer: Medicaid Other | Attending: Emergency Medicine | Admitting: Emergency Medicine

## 2013-12-15 DIAGNOSIS — R0602 Shortness of breath: Secondary | ICD-10-CM | POA: Insufficient documentation

## 2013-12-15 DIAGNOSIS — K219 Gastro-esophageal reflux disease without esophagitis: Secondary | ICD-10-CM | POA: Insufficient documentation

## 2013-12-15 DIAGNOSIS — M1990 Unspecified osteoarthritis, unspecified site: Secondary | ICD-10-CM | POA: Insufficient documentation

## 2013-12-15 DIAGNOSIS — R05 Cough: Secondary | ICD-10-CM | POA: Insufficient documentation

## 2013-12-15 DIAGNOSIS — I10 Essential (primary) hypertension: Secondary | ICD-10-CM | POA: Diagnosis not present

## 2013-12-15 DIAGNOSIS — Z79899 Other long term (current) drug therapy: Secondary | ICD-10-CM | POA: Insufficient documentation

## 2013-12-15 DIAGNOSIS — G8929 Other chronic pain: Secondary | ICD-10-CM | POA: Diagnosis not present

## 2013-12-15 DIAGNOSIS — R0789 Other chest pain: Secondary | ICD-10-CM | POA: Diagnosis not present

## 2013-12-15 DIAGNOSIS — R059 Cough, unspecified: Secondary | ICD-10-CM

## 2013-12-15 DIAGNOSIS — Z720 Tobacco use: Secondary | ICD-10-CM | POA: Diagnosis not present

## 2013-12-15 DIAGNOSIS — Z862 Personal history of diseases of the blood and blood-forming organs and certain disorders involving the immune mechanism: Secondary | ICD-10-CM | POA: Insufficient documentation

## 2013-12-15 DIAGNOSIS — F419 Anxiety disorder, unspecified: Secondary | ICD-10-CM | POA: Diagnosis not present

## 2013-12-15 LAB — CBC WITH DIFFERENTIAL/PLATELET
BASOS PCT: 0 % (ref 0–1)
Basophils Absolute: 0 10*3/uL (ref 0.0–0.1)
EOS ABS: 0.2 10*3/uL (ref 0.0–0.7)
Eosinophils Relative: 2 % (ref 0–5)
HEMATOCRIT: 33.8 % — AB (ref 36.0–46.0)
Hemoglobin: 10.6 g/dL — ABNORMAL LOW (ref 12.0–15.0)
LYMPHS ABS: 1.9 10*3/uL (ref 0.7–4.0)
Lymphocytes Relative: 16 % (ref 12–46)
MCH: 19.4 pg — ABNORMAL LOW (ref 26.0–34.0)
MCHC: 31.4 g/dL (ref 30.0–36.0)
MCV: 61.9 fL — AB (ref 78.0–100.0)
MONO ABS: 0.8 10*3/uL (ref 0.1–1.0)
Monocytes Relative: 7 % (ref 3–12)
NEUTROS ABS: 8.8 10*3/uL — AB (ref 1.7–7.7)
Neutrophils Relative %: 75 % (ref 43–77)
Platelets: 306 10*3/uL (ref 150–400)
RBC: 5.46 MIL/uL — ABNORMAL HIGH (ref 3.87–5.11)
RDW: 16.6 % — ABNORMAL HIGH (ref 11.5–15.5)
WBC: 11.7 10*3/uL — ABNORMAL HIGH (ref 4.0–10.5)

## 2013-12-15 LAB — D-DIMER, QUANTITATIVE (NOT AT ARMC): D DIMER QUANT: 0.4 ug{FEU}/mL (ref 0.00–0.48)

## 2013-12-15 LAB — COMPREHENSIVE METABOLIC PANEL
ALT: 18 U/L (ref 0–35)
ANION GAP: 14 (ref 5–15)
AST: 19 U/L (ref 0–37)
Albumin: 3.6 g/dL (ref 3.5–5.2)
Alkaline Phosphatase: 101 U/L (ref 39–117)
BUN: 11 mg/dL (ref 6–23)
CALCIUM: 9.2 mg/dL (ref 8.4–10.5)
CO2: 26 meq/L (ref 19–32)
Chloride: 100 mEq/L (ref 96–112)
Creatinine, Ser: 0.78 mg/dL (ref 0.50–1.10)
GFR calc Af Amer: >90 mL/min (ref >90)
GLUCOSE: 103 mg/dL — AB (ref 70–99)
Potassium: 3.9 mEq/L (ref 3.7–5.3)
Sodium: 140 mEq/L (ref 137–147)
Total Bilirubin: 0.5 mg/dL (ref 0.3–1.2)
Total Protein: 6.9 g/dL (ref 6.0–8.3)

## 2013-12-15 LAB — I-STAT TROPONIN, ED: Troponin i, poc: 0.01 ng/mL (ref 0.00–0.08)

## 2013-12-15 LAB — PRO B NATRIURETIC PEPTIDE: Pro B Natriuretic peptide (BNP): 278 pg/mL — ABNORMAL HIGH (ref 0–125)

## 2013-12-15 MED ORDER — AMOXICILLIN-POT CLAVULANATE 875-125 MG PO TABS: 1.0000 | ORAL_TABLET | Freq: Two times a day (BID) | ORAL | Status: DC

## 2013-12-15 MED ORDER — ALBUTEROL SULFATE HFA 108 (90 BASE) MCG/ACT IN AERS: 1.0000 | INHALATION_SPRAY | Freq: Four times a day (QID) | RESPIRATORY_TRACT | Status: DC | PRN

## 2013-12-15 NOTE — ED Provider Notes (Signed)
CSN: [id number]     Arrival date & time 12/15/13  0213 History   First MD Initiated Contact with Patient 12/15/13 0241     Chief Complaint  Patient presents with  . Cough     (Consider location/radiation/quality/duration/timing/severity/associated sxs/prior Treatment) HPI Comments: Patient states she had a coughing spell at home after she thinks she breathed in some fumes. She noticed that she had blood streaks in her sputum. She is now has chest tightness and shortness of breath since this coughing episode. Previous to this she was doing fine. She denies any history of heart or lung problems. She's a history of hypertension and chronic low back pain. Denies any focal weakness, numbness or tingling. No history of blood clots. No history of asthma or COPD but she is a smoker.  The history is provided by the patient.    Past Medical History  Diagnosis Date  . Arthritis   . Hypertension   . Chronic back pain   . Anxiety   . GERD (gastroesophageal reflux disease)   . Shortness of breath     on exertion due to weight  . Blood dyscrasia     sickle cell trait  . Anemia    Past Surgical History  Procedure Laterality Date  . Cholecystectomy    . Cesarean section    . Tubal ligation    . Hysteroscopy N/A 08/23/2013    Procedure: HYSTEROSCOPY D&C WITH HYDROTHERMAL ABLATION;  Surgeon: Osborne Oman, MD;  Location: [facility];  Service: Gynecology;  Laterality: N/A;   Family History  Problem Relation Age of Onset  . CAD Father   . Heart failure Brother   . Hypertension Brother   . CAD Paternal Aunt    History  Substance Use Topics  . Smoking status: Current Every Day Smoker -- 0.50 packs/day for 14 years    Types: Cigarettes  . Smokeless tobacco: Not on file  . Alcohol Use: No   OB History   Grav Para Term Preterm Abortions TAB SAB Ect Mult Living   4 4 4       2      Review of Systems  Constitutional: Negative for fever, activity change and appetite change.  HENT: Negative  for congestion and rhinorrhea.   Eyes: Negative for visual disturbance.  Respiratory: Positive for cough, chest tightness and shortness of breath.   Cardiovascular: Positive for chest pain.  Gastrointestinal: Negative for nausea, vomiting and abdominal pain.  Genitourinary: Negative for dysuria, hematuria, vaginal bleeding and vaginal discharge.  Musculoskeletal: Negative for arthralgias and myalgias.  Skin: Negative for wound.  Neurological: Negative for dizziness, weakness and headaches.  A complete 10 system review of systems was obtained and all systems are negative except as noted in the HPI and PMH.      Allergies  Review of patient's allergies indicates no known allergies.  Home Medications   Prior to Admission medications   Medication Sig Start Date End Date Taking? Authorizing Provider  carvedilol (COREG) 25 MG tablet Take 1 tablet (25 mg total) by mouth 2 (two) times daily with a meal. 06/28/13  Yes Burtis Junes, NP  cloNIDine (CATAPRES) 0.3 MG tablet Take 1 tablet (0.3 mg total) by mouth 3 (three) times daily. 06/28/13  Yes Burtis Junes, NP  hydrALAZINE (APRESOLINE) 25 MG tablet Take 3 tablets (75 mg total) by mouth every 8 (eight) hours. 06/28/13  Yes Burtis Junes, NP  hydrochlorothiazide (HYDRODIURIL) 50 MG tablet Take 1 tablet (50 mg total)  by mouth daily. 06/28/13  Yes Burtis Junes, NP  lisinopril (PRINIVIL,ZESTRIL) 2.5 MG tablet Take 1 tablet (2.5 mg total) by mouth daily. 06/28/13  Yes Burtis Junes, NP  omeprazole (PRILOSEC) 20 MG capsule Take 20 mg by mouth daily.   Yes Historical Provider, MD  oxyCODONE-acetaminophen (PERCOCET/ROXICET) 5-325 MG per tablet Take 2 tablets by mouth every 4 (four) hours as needed for severe pain. 08/12/13  Yes Kalman Drape, MD  PARoxetine (PAXIL) 20 MG tablet Take 20 mg by mouth daily.   Yes Historical Provider, MD  phentermine 37.5 MG capsule Take 37.5 mg by mouth every morning.   Yes Historical Provider, MD  traZODone (DESYREL) 100  MG tablet Take 1 tablet (100 mg total) by mouth at bedtime. 06/07/13  Yes Belkys A Regalado, MD  albuterol (PROVENTIL HFA;VENTOLIN HFA) 108 (90 BASE) MCG/ACT inhaler Inhale 1-2 puffs into the lungs every 6 (six) hours as needed for wheezing or shortness of breath. 12/15/13   Ezequiel Essex, MD  amoxicillin-clavulanate (AUGMENTIN) 875-125 MG per tablet Take 1 tablet by mouth every 12 (twelve) hours. 12/15/13   Ezequiel Essex, MD   BP 145/106  Pulse 71  Temp(Src) 98.5 F (36.9 C) (Oral)  Resp 20  Ht 5\' 7"  (1.702 m)  Wt 325 lb (147.419 kg)  BMI 50.89 kg/m2  SpO2 98% Physical Exam  Nursing note and vitals reviewed. Constitutional: She is oriented to person, place, and time. She appears well-developed and well-nourished. No distress.  HENT:  Head: Normocephalic and atraumatic.  Mouth/Throat: Oropharynx is clear and moist. No oropharyngeal exudate.  Eyes: Conjunctivae and EOM are normal. Pupils are equal, round, and reactive to light.  Neck: Normal range of motion. Neck supple.  No meningismus.  Cardiovascular: Normal rate, regular rhythm, normal heart sounds and intact distal pulses.   No murmur heard. Pulmonary/Chest: Effort normal and breath sounds normal. No respiratory distress. She exhibits tenderness.  ttp central chest  Abdominal: Soft. There is no tenderness. There is no rebound and no guarding.  Musculoskeletal: Normal range of motion. She exhibits no edema and no tenderness.  Puncture wound to right lower leg with surrounding erythema.  Neurological: She is alert and oriented to person, place, and time. No cranial nerve deficit. She exhibits normal muscle tone. Coordination normal.  No ataxia on finger to nose bilaterally. No pronator drift. 5/5 strength throughout. CN 2-12 intact. Negative Romberg. Equal grip strength. Sensation intact. Gait is normal.   Skin: Skin is warm.  Psychiatric: She has a normal mood and affect. Her behavior is normal.    ED Course  Procedures  (including critical care time) Labs Review Labs Reviewed  CBC WITH DIFFERENTIAL - Abnormal; Notable for the following:    WBC 11.7 (*)    RBC 5.46 (*)    Hemoglobin 10.6 (*)    HCT 33.8 (*)    MCV 61.9 (*)    MCH 19.4 (*)    RDW 16.6 (*)    Neutro Abs 8.8 (*)    All other components within normal limits  COMPREHENSIVE METABOLIC PANEL - Abnormal; Notable for the following:    Glucose, Bld 103 (*)    All other components within normal limits  PRO B NATRIURETIC PEPTIDE - Abnormal; Notable for the following:    Pro B Natriuretic peptide (BNP) 278.0 (*)    All other components within normal limits  D-DIMER, QUANTITATIVE  Randolm Idol, ED    Imaging Review Dg Chest 2 View  12/15/2013   CLINICAL DATA:  43 year old female with cough congestion and central eyes chest pain. Initial encounter.  EXAM: CHEST  2 VIEW  COMPARISON:  06/05/2013 and earlier.  FINDINGS: Large body habitus. Lung volumes are stable and within normal limits. Stable cardiac size at the upper limits of normal to mildly enlarged. Other mediastinal contours are within normal limits. Visualized tracheal air column is within normal limits. Lung parenchyma stable and clear. No pneumothorax or effusion. No acute osseous abnormality identified.  IMPRESSION: No acute cardiopulmonary abnormality.   Electronically Signed   By: Lars Pinks M.D.   On: 12/15/2013 03:01     EKG Interpretation   Date/Time:  Friday December 15 2013 02:27:14 EDT Ventricular Rate:  62 PR Interval:  168 QRS Duration: 84 QT Interval:  434 QTC Calculation: 440 R Axis:   48 Text Interpretation:  Normal sinus rhythm Cannot rule out Anterior infarct  , age undetermined Abnormal ECG No significant change was found Confirmed  by Wyvonnia Dusky  MD, Arthur Aydelotte [phone]) on 12/15/2013 2:54:50 AM      MDM   Final diagnoses:  Cough   coughing episode with hemoptysis, chest tightness and shortness of breath.  Chest xray negative.  EKG nonischemic.  Patient  reports dog bite to her right ankle one month ago with no treatment.  D-dimer negative. Troponin negative.  We'll treat with bronchodilators. We'll also give patient antibiotics for her dog bite from last month. Followup with PCP. Return precautions discussed.  BP 145/106  Pulse 71  Temp(Src) 98.5 F (36.9 C) (Oral)  Resp 20  Ht 5\' 7"  (1.702 m)  Wt 325 lb (147.419 kg)  BMI 50.89 kg/m2  SpO2 98%   Ezequiel Essex, MD 12/15/13 1519

## 2013-12-15 NOTE — Discharge Instructions (Signed)
Cough, Adult There is no evidence of pneumonia or blood clot in the lung. Follow up with your doctor. Return to the ED if you develop new or worsening symptoms.  A cough is a reflex that helps clear your throat and airways. It can help heal the body or may be a reaction to an irritated airway. A cough may only last 2 or 3 weeks (acute) or may last more than 8 weeks (chronic).  CAUSES Acute cough:  Viral or bacterial infections. Chronic cough:  Infections.  Allergies.  Asthma.  Post-nasal drip.  Smoking.  Heartburn or acid reflux.  Some medicines.  Chronic lung problems (COPD).  Cancer. SYMPTOMS   Cough.  Fever.  Chest pain.  Increased breathing rate.  High-pitched whistling sound when breathing (wheezing).  Colored mucus that you cough up (sputum). TREATMENT   A bacterial cough may be treated with antibiotic medicine.  A viral cough must run its course and will not respond to antibiotics.  Your caregiver may recommend other treatments if you have a chronic cough. HOME CARE INSTRUCTIONS   Only take over-the-counter or prescription medicines for pain, discomfort, or fever as directed by your caregiver. Use cough suppressants only as directed by your caregiver.  Use a cold steam vaporizer or humidifier in your bedroom or home to help loosen secretions.  Sleep in a semi-upright position if your cough is worse at night.  Rest as needed.  Stop smoking if you smoke. SEEK IMMEDIATE MEDICAL CARE IF:   You have pus in your sputum.  Your cough starts to worsen.  You cannot control your cough with suppressants and are losing sleep.  You begin coughing up blood.  You have difficulty breathing.  You develop pain which is getting worse or is uncontrolled with medicine.  You have a fever. MAKE SURE YOU:   Understand these instructions.  Will watch your condition.  Will get help right away if you are not doing well or get worse. Document Released:  08/08/2010 Document Revised: 05/04/2011 Document Reviewed: 08/08/2010 [facility] Patient Information 2015 [place], [place]. This information is not intended to replace advice given to you by your health care provider. Make sure you discuss any questions you have with your health care provider.

## 2013-12-15 NOTE — ED Notes (Signed)
Pt c/o coughing up blood onset earlier tonight.  Also c/o shortness of breath and chest tightness

## 2013-12-25 ENCOUNTER — Encounter (HOSPITAL_COMMUNITY): Payer: Self-pay | Admitting: Emergency Medicine

## 2013-12-26 ENCOUNTER — Other Ambulatory Visit: Payer: Self-pay | Admitting: Nurse Practitioner

## 2014-01-10 ENCOUNTER — Emergency Department (HOSPITAL_COMMUNITY)
Admission: EM | Admit: 2014-01-10 | Discharge: 2014-01-11 | Disposition: A | Discharge status: Home / Self Care | Payer: Medicaid Other | Attending: Emergency Medicine | Admitting: Emergency Medicine

## 2014-01-10 ENCOUNTER — Encounter (HOSPITAL_COMMUNITY): Payer: Self-pay | Admitting: Emergency Medicine

## 2014-01-10 DIAGNOSIS — W540XXD Bitten by dog, subsequent encounter: Secondary | ICD-10-CM | POA: Diagnosis not present

## 2014-01-10 DIAGNOSIS — Z79899 Other long term (current) drug therapy: Secondary | ICD-10-CM | POA: Diagnosis not present

## 2014-01-10 DIAGNOSIS — T814XXD Infection following a procedure, subsequent encounter: Secondary | ICD-10-CM | POA: Diagnosis not present

## 2014-01-10 DIAGNOSIS — G8929 Other chronic pain: Secondary | ICD-10-CM | POA: Diagnosis not present

## 2014-01-10 DIAGNOSIS — S81831D Puncture wound without foreign body, right lower leg, subsequent encounter: Secondary | ICD-10-CM | POA: Insufficient documentation

## 2014-01-10 DIAGNOSIS — Z8739 Personal history of other diseases of the musculoskeletal system and connective tissue: Secondary | ICD-10-CM | POA: Insufficient documentation

## 2014-01-10 DIAGNOSIS — F419 Anxiety disorder, unspecified: Secondary | ICD-10-CM | POA: Insufficient documentation

## 2014-01-10 DIAGNOSIS — Z792 Long term (current) use of antibiotics: Secondary | ICD-10-CM | POA: Diagnosis not present

## 2014-01-10 DIAGNOSIS — Z862 Personal history of diseases of the blood and blood-forming organs and certain disorders involving the immune mechanism: Secondary | ICD-10-CM | POA: Insufficient documentation

## 2014-01-10 DIAGNOSIS — I10 Essential (primary) hypertension: Secondary | ICD-10-CM | POA: Insufficient documentation

## 2014-01-10 DIAGNOSIS — T148XXA Other injury of unspecified body region, initial encounter: Secondary | ICD-10-CM

## 2014-01-10 DIAGNOSIS — K219 Gastro-esophageal reflux disease without esophagitis: Secondary | ICD-10-CM | POA: Insufficient documentation

## 2014-01-10 DIAGNOSIS — T798XXD Other early complications of trauma, subsequent encounter: Secondary | ICD-10-CM

## 2014-01-10 DIAGNOSIS — Z720 Tobacco use: Secondary | ICD-10-CM | POA: Insufficient documentation

## 2014-01-10 LAB — CBC WITH DIFFERENTIAL/PLATELET
Basophils Absolute: 0.1 10*3/uL (ref 0.0–0.1)
Basophils Relative: 1 % (ref 0–1)
EOS ABS: 0.2 10*3/uL (ref 0.0–0.7)
Eosinophils Relative: 2 % (ref 0–5)
HEMATOCRIT: 35.6 % — AB (ref 36.0–46.0)
Hemoglobin: 10.9 g/dL — ABNORMAL LOW (ref 12.0–15.0)
LYMPHS ABS: 2.4 10*3/uL (ref 0.7–4.0)
Lymphocytes Relative: 27 % (ref 12–46)
MCH: 18.5 pg — ABNORMAL LOW (ref 26.0–34.0)
MCHC: 30.6 g/dL (ref 30.0–36.0)
MCV: 60.5 fL — ABNORMAL LOW (ref 78.0–100.0)
MONO ABS: 0.6 10*3/uL (ref 0.1–1.0)
Monocytes Relative: 7 % (ref 3–12)
NEUTROS ABS: 5.6 10*3/uL (ref 1.7–7.7)
Neutrophils Relative %: 63 % (ref 43–77)
Platelets: 330 10*3/uL (ref 150–400)
RBC: 5.88 MIL/uL — ABNORMAL HIGH (ref 3.87–5.11)
RDW: 15.5 % (ref 11.5–15.5)
WBC: 8.9 10*3/uL (ref 4.0–10.5)

## 2014-01-10 LAB — COMPREHENSIVE METABOLIC PANEL
ALT: 15 U/L (ref 0–35)
ANION GAP: 14 (ref 5–15)
AST: 19 U/L (ref 0–37)
Albumin: 3.7 g/dL (ref 3.5–5.2)
Alkaline Phosphatase: 110 U/L (ref 39–117)
BILIRUBIN TOTAL: 0.7 mg/dL (ref 0.3–1.2)
BUN: 6 mg/dL (ref 6–23)
CHLORIDE: 100 meq/L (ref 96–112)
CO2: 23 meq/L (ref 19–32)
Calcium: 8.7 mg/dL (ref 8.4–10.5)
Creatinine, Ser: 0.69 mg/dL (ref 0.50–1.10)
GFR calc Af Amer: >90 mL/min (ref >90)
Glucose, Bld: 89 mg/dL (ref 70–99)
Potassium: 3.8 mEq/L (ref 3.7–5.3)
Sodium: 137 mEq/L (ref 137–147)
Total Protein: 7.2 g/dL (ref 6.0–8.3)

## 2014-01-10 MED ORDER — TETANUS-DIPHTH-ACELL PERTUSSIS 5-2.5-18.5 LF-MCG/0.5 IM SUSP
0.5000 mL | Freq: Once | INTRAMUSCULAR | Status: AC
  Administered 2014-01-10: 0.5 mL via INTRAMUSCULAR
  Filled 2014-01-10: qty 0.5

## 2014-01-10 MED ORDER — HYDROCODONE-ACETAMINOPHEN 5-325 MG PO TABS
2.0000 | ORAL_TABLET | Freq: Once | ORAL | Status: AC
  Administered 2014-01-10: 2 via ORAL
  Filled 2014-01-10: qty 2

## 2014-01-10 MED ORDER — AMOXICILLIN-POT CLAVULANATE 875-125 MG PO TABS
1.0000 | ORAL_TABLET | Freq: Once | ORAL | Status: AC
  Administered 2014-01-10: 1 via ORAL
  Filled 2014-01-10: qty 1

## 2014-01-10 NOTE — ED Provider Notes (Signed)
CSN: [id number]     Arrival date & time 01/10/14  2104 History   First MD Initiated Contact with Patient 01/10/14 2324     Chief Complaint  Patient presents with  . Wound Infection     (Consider location/radiation/quality/duration/timing/severity/associated sxs/prior Treatment) HPI Complains of painful right leg as result of being bitten by her own dog at medial aspect of right calf. Dog bite occurred at end of September 2015. Dog is healthy. She denies other complaint. Pain is nonradiating. Worse with pressing on the urine not improved by anything. She was seen here on 12/15/2013 prescribed Augmentin, without improvement. No fever no vomiting no other associated symptoms Past Medical History  Diagnosis Date  . Arthritis   . Hypertension   . Chronic back pain   . Anxiety   . GERD (gastroesophageal reflux disease)   . Shortness of breath     on exertion due to weight  . Blood dyscrasia     sickle cell trait  . Anemia    Past Surgical History  Procedure Laterality Date  . Cholecystectomy    . Cesarean section    . Tubal ligation    . Hysteroscopy N/A 08/23/2013    Procedure: HYSTEROSCOPY D&C WITH HYDROTHERMAL ABLATION;  Surgeon: Osborne Oman, MD;  Location: [facility];  Service: Gynecology;  Laterality: N/A;   Family History  Problem Relation Age of Onset  . CAD Father   . Heart failure Brother   . Hypertension Brother   . CAD Paternal Aunt    History  Substance Use Topics  . Smoking status: Current Every Day Smoker -- 0.50 packs/day for 14 years    Types: Cigarettes  . Smokeless tobacco: Not on file  . Alcohol Use: No   OB History    Gravida Para Term Preterm AB TAB SAB Ectopic Multiple Living   4 4 4       2      Review of Systems  Skin: Positive for wound.  All other systems reviewed and are negative.     Allergies  Review of patient's allergies indicates no known allergies.  Home Medications   Prior to Admission medications   Medication Sig Start Date  End Date Taking? Authorizing Provider  albuterol (PROVENTIL HFA;VENTOLIN HFA) 108 (90 BASE) MCG/ACT inhaler Inhale 1-2 puffs into the lungs every 6 (six) hours as needed for wheezing or shortness of breath. 12/15/13   Ezequiel Essex, MD  amoxicillin-clavulanate (AUGMENTIN) 875-125 MG per tablet Take 1 tablet by mouth every 12 (twelve) hours. 12/15/13   Ezequiel Essex, MD  carvedilol (COREG) 25 MG tablet Take 1 tablet (25 mg total) by mouth 2 (two) times daily with a meal. 06/28/13   Burtis Junes, NP  cloNIDine (CATAPRES) 0.3 MG tablet TAKE ONE TABLET BY MOUTH THREE TIMES DAILY 12/28/13   Thayer Headings, MD  hydrALAZINE (APRESOLINE) 25 MG tablet Take 3 tablets (75 mg total) by mouth every 8 (eight) hours. 06/28/13   Burtis Junes, NP  hydrochlorothiazide (HYDRODIURIL) 50 MG tablet Take 1 tablet (50 mg total) by mouth daily. 06/28/13   Burtis Junes, NP  lisinopril (PRINIVIL,ZESTRIL) 2.5 MG tablet Take 1 tablet (2.5 mg total) by mouth daily. 06/28/13   Burtis Junes, NP  omeprazole (PRILOSEC) 20 MG capsule Take 20 mg by mouth daily.    Historical Provider, MD  oxyCODONE-acetaminophen (PERCOCET/ROXICET) 5-325 MG per tablet Take 2 tablets by mouth every 4 (four) hours as needed for severe pain. 08/12/13   Michelene Heady  Eather Colas, MD  PARoxetine (PAXIL) 20 MG tablet Take 20 mg by mouth daily.    Historical Provider, MD  phentermine 37.5 MG capsule Take 37.5 mg by mouth every morning.    Historical Provider, MD  traZODone (DESYREL) 100 MG tablet Take 1 tablet (100 mg total) by mouth at bedtime. 06/07/13   Belkys A Regalado, MD   BP 189/103 mmHg  Pulse 68  Temp(Src) 98.1 F (36.7 C) (Oral)  Resp 20  SpO2 99% Physical Exam  Constitutional: She appears well-developed and well-nourished.  HENT:  Head: Normocephalic and atraumatic.  Eyes: Conjunctivae are normal. Pupils are equal, round, and reactive to light.  Neck: Neck supple. No tracheal deviation present. No thyromegaly present.  Cardiovascular: Normal  rate and regular rhythm.   No murmur heard. Pulmonary/Chest: Effort normal and breath sounds normal.  Abdominal: Soft. Bowel sounds are normal. She exhibits no distension. There is no tenderness.  Musculoskeletal: Normal range of motion. She exhibits no edema or tenderness.  Right lower extremity medial aspect of lower leg proximal with 10 cm proximal to medial malleolus there is a 1 cm puncture wound with yellowish discharge at Center of wound is which is correspondingly tender no surrounding redness. No swelling. DP pulse 2+. No red streaks up leg.  Neurological: She is alert. Coordination normal.  Skin: Skin is warm and dry. No rash noted.  Psychiatric: She has a normal mood and affect.  Nursing note and vitals reviewed.   ED Course  Procedures (including critical care time) Labs Review Labs Reviewed  CBC WITH DIFFERENTIAL - Abnormal; Notable for the following:    RBC 5.88 (*)    Hemoglobin 10.9 (*)    HCT 35.6 (*)    MCV 60.5 (*)    MCH 18.5 (*)    All other components within normal limits  COMPREHENSIVE METABOLIC PANEL    Imaging Review No results found.   EKG Interpretation None     X-ray viewed by me  Results for orders placed or performed during the hospital encounter of 01/10/14  Comprehensive metabolic panel  Result Value Ref Range   Sodium 137 137 - 147 mEq/L   Potassium 3.8 3.7 - 5.3 mEq/L   Chloride 100 96 - 112 mEq/L   CO2 23 19 - 32 mEq/L   Glucose, Bld 89 70 - 99 mg/dL   BUN 6 6 - 23 mg/dL   Creatinine, Ser 0.69 0.50 - 1.10 mg/dL   Calcium 8.7 8.4 - 10.5 mg/dL   Total Protein 7.2 6.0 - 8.3 g/dL   Albumin 3.7 3.5 - 5.2 g/dL   AST 19 0 - 37 U/L   ALT 15 0 - 35 U/L   Alkaline Phosphatase 110 39 - 117 U/L   Total Bilirubin 0.7 0.3 - 1.2 mg/dL   GFR calc non Af Amer >90 >90 mL/min   GFR calc Af Amer >90 >90 mL/min   Anion gap 14 5 - 15  CBC with Differential  Result Value Ref Range   WBC 8.9 4.0 - 10.5 K/uL   RBC 5.88 (H) 3.87 - 5.11 MIL/uL    Hemoglobin 10.9 (L) 12.0 - 15.0 g/dL   HCT 35.6 (L) 36.0 - 46.0 %   MCV 60.5 (L) 78.0 - 100.0 fL   MCH 18.5 (L) 26.0 - 34.0 pg   MCHC 30.6 30.0 - 36.0 g/dL   RDW 15.5 11.5 - 15.5 %   Platelets 330 150 - 400 K/uL   Neutrophils Relative % 63 43 -  77 %   Lymphocytes Relative 27 12 - 46 %   Monocytes Relative 7 3 - 12 %   Eosinophils Relative 2 0 - 5 %   Basophils Relative 1 0 - 1 %   Neutro Abs 5.6 1.7 - 7.7 K/uL   Lymphs Abs 2.4 0.7 - 4.0 K/uL   Monocytes Absolute 0.6 0.1 - 1.0 K/uL   Eosinophils Absolute 0.2 0.0 - 0.7 K/uL   Basophils Absolute 0.1 0.0 - 0.1 K/uL   RBC Morphology POLYCHROMASIA PRESENT    Smear Review LARGE PLATELETS PRESENT    Dg Chest 2 View  12/15/2013   CLINICAL DATA:  43 year old female with cough congestion and central eyes chest pain. Initial encounter.  EXAM: CHEST  2 VIEW  COMPARISON:  06/05/2013 and earlier.  FINDINGS: Large body habitus. Lung volumes are stable and within normal limits. Stable cardiac size at the upper limits of normal to mildly enlarged. Other mediastinal contours are within normal limits. Visualized tracheal air column is within normal limits. Lung parenchyma stable and clear. No pneumothorax or effusion. No acute osseous abnormality identified.  IMPRESSION: No acute cardiopulmonary abnormality.   Electronically Signed   By: Lars Pinks M.D.   On: 12/15/2013 03:01   Dg Tibia/fibula Right  01/11/2014   CLINICAL DATA:  Nonhealing wound from previous dog bite 1 year ago  EXAM: RIGHT TIBIA AND FIBULA - 2 VIEW  COMPARISON:  None.  FINDINGS: No bony abnormality is noted. A small defect in the soft tissues is noted related to the known wound. No extension to the underlying bony structures is noted.  IMPRESSION: Mild soft tissue defect without bony abnormality.   Electronically Signed   By: Inez Catalina M.D.   On: 01/11/2014 00:11   1240 pain improved after treatment with norco, Tdap given MDM  Patient has localized wound infection. She has not taken  her blood pressure medicine tonight. Plan prescription Augmentin.  Norco. Referral wound center. Blood pressure recheck one week and anemia is chronic and unchanged Dx#1 infected dog bite right leg #2 hypertension #3 anemia Final diagnoses:  None        Orlie Dakin, MD 01/11/14 [phone]

## 2014-01-10 NOTE — ED Notes (Signed)
Pt was bit by dog 2 mos ago; given antibiotics and took all of them; has dime size wound on inner leg about 6 inches above ankle on inner right leg. Scant yellowish discharge noted. Pt states painful and not getting better.

## 2014-01-10 NOTE — ED Notes (Signed)
Pt states wound has grown in size.

## 2014-01-11 ENCOUNTER — Emergency Department (HOSPITAL_COMMUNITY): Payer: Medicaid Other

## 2014-01-11 MED ORDER — AMOXICILLIN-POT CLAVULANATE 875-125 MG PO TABS: 1.0000 | ORAL_TABLET | Freq: Two times a day (BID) | ORAL | Status: DC

## 2014-01-11 MED ORDER — BACITRACIN 500 UNIT/GM EX OINT
1.0000 "application " | TOPICAL_OINTMENT | Freq: Once | CUTANEOUS | Status: AC
  Administered 2014-01-11: 1 via TOPICAL
  Filled 2014-01-11: qty 28

## 2014-01-11 MED ORDER — HYDROCODONE-ACETAMINOPHEN 5-325 MG PO TABS: 1.0000 | ORAL_TABLET | Freq: Four times a day (QID) | ORAL | Status: DC | PRN

## 2014-01-11 NOTE — ED Notes (Signed)
Pt back from x-ray.

## 2014-01-11 NOTE — Discharge Instructions (Signed)
Animal Bite Call the [facility] tomorrow to schedule next available appointment. Wash your wound daily by holding it under the warm shower and place a thin layer of bacitracin ointment over the wound and cover with a sterile bandage. Take the antibiotic as prescribed. Take Tylenol for mild pain or the pain medicine prescribed for bad pain.take your evening doseblood pressure medication as soon as you get home. Get your blood pressure rechecked in one week. Tonight's was elevated at 189/103 Animal bite wounds can get infected. It is important to get proper medical treatment. Ask your doctor if you need a rabies shot. HOME CARE   Follow your doctor's instructions for taking care of your wound.  Only take medicine as told by your doctor.  Take your medicine (antibiotics) as told. Finish them even if you start to feel better.  Keep all doctor visits as told. You may need a tetanus shot if:   You cannot remember when you had your last tetanus shot.  You have never had a tetanus shot.  The injury broke your skin. If you need a tetanus shot and you choose not to have one, you may get tetanus. Sickness from tetanus can be serious. GET HELP RIGHT AWAY IF:   Your wound is warm, red, sore, or puffy (swollen).  You notice yellowish-white fluid (pus) or a bad smell coming from the wound.  You see a red line on the skin coming from the wound.  You have a fever, chills, or you feel sick.  You feel sick to your stomach (nauseous), or you throw up (vomit).  Your pain does not go away, or it gets worse.  You have trouble moving the injured part.  You have questions or concerns. MAKE SURE YOU:   Understand these instructions.  Will watch your condition.  Will get help right away if you are not doing well or get worse. Document Released: 02/09/2005 Document Revised: 05/04/2011 Document Reviewed: 10/01/2010 [facility] Patient Information 2015 [place], [place]. This  information is not intended to replace advice given to you by your health care provider. Make sure you discuss any questions you have with your health care provider.

## 2014-01-11 NOTE — ED Notes (Signed)
Pt taken to xray 

## 2014-01-22 ENCOUNTER — Other Ambulatory Visit: Payer: Self-pay | Admitting: Nurse Practitioner

## 2014-01-29 ENCOUNTER — Ambulatory Visit: Payer: Medicaid Other | Admitting: Nurse Practitioner

## 2014-02-06 ENCOUNTER — Encounter (HOSPITAL_COMMUNITY): Payer: Self-pay | Admitting: Emergency Medicine

## 2014-02-06 ENCOUNTER — Emergency Department (HOSPITAL_COMMUNITY)
Admission: EM | Admit: 2014-02-06 | Discharge: 2014-02-06 | Disposition: A | Discharge status: Home / Self Care | Payer: Medicaid Other | Attending: Emergency Medicine | Admitting: Emergency Medicine

## 2014-02-06 DIAGNOSIS — L989 Disorder of the skin and subcutaneous tissue, unspecified: Secondary | ICD-10-CM | POA: Insufficient documentation

## 2014-02-06 DIAGNOSIS — E669 Obesity, unspecified: Secondary | ICD-10-CM | POA: Insufficient documentation

## 2014-02-06 DIAGNOSIS — M1990 Unspecified osteoarthritis, unspecified site: Secondary | ICD-10-CM | POA: Insufficient documentation

## 2014-02-06 DIAGNOSIS — Z862 Personal history of diseases of the blood and blood-forming organs and certain disorders involving the immune mechanism: Secondary | ICD-10-CM | POA: Insufficient documentation

## 2014-02-06 DIAGNOSIS — Z720 Tobacco use: Secondary | ICD-10-CM | POA: Insufficient documentation

## 2014-02-06 DIAGNOSIS — G8929 Other chronic pain: Secondary | ICD-10-CM | POA: Diagnosis not present

## 2014-02-06 DIAGNOSIS — F419 Anxiety disorder, unspecified: Secondary | ICD-10-CM | POA: Insufficient documentation

## 2014-02-06 DIAGNOSIS — Z79899 Other long term (current) drug therapy: Secondary | ICD-10-CM | POA: Diagnosis not present

## 2014-02-06 DIAGNOSIS — K219 Gastro-esophageal reflux disease without esophagitis: Secondary | ICD-10-CM | POA: Diagnosis not present

## 2014-02-06 DIAGNOSIS — I10 Essential (primary) hypertension: Secondary | ICD-10-CM | POA: Insufficient documentation

## 2014-02-06 DIAGNOSIS — L089 Local infection of the skin and subcutaneous tissue, unspecified: Secondary | ICD-10-CM | POA: Diagnosis present

## 2014-02-06 MED ORDER — TRAMADOL HCL 50 MG PO TABS
50.0000 mg | ORAL_TABLET | Freq: Once | ORAL | Status: AC
  Administered 2014-02-06: 50 mg via ORAL
  Filled 2014-02-06: qty 1

## 2014-02-06 MED ORDER — MUPIROCIN CALCIUM 2 % NA OINT: TOPICAL_OINTMENT | NASAL | Status: DC

## 2014-02-06 NOTE — Discharge Instructions (Signed)
You will need to use the Bactroban as directed by the pharmacist. He will also need to follow-up with your primary care for further evaluation and management of your symptoms. Return to ED for worsening symptoms, fevers, chills, numbness or weakness

## 2014-02-06 NOTE — ED Notes (Signed)
Pt. reports pain at right lower leg wound from a dog bite 3 months ago , states redness with " burning " sensation for the past several days , denies drainage , no fever or chills.

## 2014-02-06 NOTE — ED Provider Notes (Signed)
CSN: [id number]     Arrival date & time 02/06/14  0038 History   First MD Initiated Contact with Patient 02/06/14 0101     Chief Complaint  Patient presents with  . Wound Infection     (Consider location/radiation/quality/duration/timing/severity/associated sxs/prior Treatment) HPI Kelly Novak is a 43 y.o. female complains of a poorly healing lesion on the right leg she says is from being bitten by her dog in September. She reports burning pain around the site. She completed her antibiotics, but the wound does not heal well. She has not tried anything at home for the pain She denies fevers, chills, numbness or weakness. No drainage or redness to the site. No other modifying factors  Past Medical History  Diagnosis Date  . Arthritis   . Hypertension   . Chronic back pain   . Anxiety   . GERD (gastroesophageal reflux disease)   . Shortness of breath     on exertion due to weight  . Blood dyscrasia     sickle cell trait  . Anemia    Past Surgical History  Procedure Laterality Date  . Cholecystectomy    . Cesarean section    . Tubal ligation    . Hysteroscopy N/A 08/23/2013    Procedure: HYSTEROSCOPY D&C WITH HYDROTHERMAL ABLATION;  Surgeon: Osborne Oman, MD;  Location: [facility];  Service: Gynecology;  Laterality: N/A;   Family History  Problem Relation Age of Onset  . CAD Father   . Heart failure Brother   . Hypertension Brother   . CAD Paternal Aunt    History  Substance Use Topics  . Smoking status: Current Every Day Smoker -- 0.50 packs/day for 14 years    Types: Cigarettes  . Smokeless tobacco: Not on file  . Alcohol Use: No   OB History    Gravida Para Term Preterm AB TAB SAB Ectopic Multiple Living   4 4 4       2      Review of Systems  Constitutional: Negative for fever.  HENT: Negative for sore throat.   Eyes: Negative for visual disturbance.  Respiratory: Negative for shortness of breath.   Cardiovascular: Negative for chest pain.  Gastrointestinal:  Negative for abdominal pain.  Endocrine: Negative for polyuria.  Genitourinary: Negative for dysuria.  Skin: Positive for wound. Negative for rash.       Right calf  Neurological: Negative for headaches.      Allergies  Review of patient's allergies indicates no known allergies.  Home Medications   Prior to Admission medications   Medication Sig Start Date End Date Taking? Authorizing Provider  albuterol (PROVENTIL HFA;VENTOLIN HFA) 108 (90 BASE) MCG/ACT inhaler Inhale 1-2 puffs into the lungs every 6 (six) hours as needed for wheezing or shortness of breath. 12/15/13   Ezequiel Essex, MD  amoxicillin-clavulanate (AUGMENTIN) 875-125 MG per tablet Take 1 tablet by mouth every 12 (twelve) hours. 12/15/13   Ezequiel Essex, MD  amoxicillin-clavulanate (AUGMENTIN) 875-125 MG per tablet Take 1 tablet by mouth 2 (two) times daily. One po bid x 7 days 01/11/14   Orlie Dakin, MD  carvedilol (COREG) 25 MG tablet Take 1 tablet (25 mg total) by mouth 2 (two) times daily with a meal. 06/28/13   Burtis Junes, NP  cloNIDine (CATAPRES) 0.3 MG tablet TAKE ONE TABLET BY MOUTH THREE TIMES DAILY 01/22/14   Ramond Dial, MD  hydrALAZINE (APRESOLINE) 25 MG tablet TAKE THREE TABLETS BY MOUTH EVERY 8 HOURS 01/22/14  Ramond Dial, MD  hydrochlorothiazide (HYDRODIURIL) 50 MG tablet Take 1 tablet (50 mg total) by mouth daily. 06/28/13   Burtis Junes, NP  HYDROcodone-acetaminophen (NORCO) 5-325 MG per tablet Take 1-2 tablets by mouth every 6 (six) hours as needed for moderate pain or severe pain. 01/11/14   Orlie Dakin, MD  lisinopril (PRINIVIL,ZESTRIL) 2.5 MG tablet Take 1 tablet (2.5 mg total) by mouth daily. 06/28/13   Burtis Junes, NP  mupirocin nasal ointment (BACTROBAN) 2 % Apply in each nostril daily 02/06/14   Viona Gilmore Kiyoto Slomski, PA-C  omeprazole (PRILOSEC) 20 MG capsule Take 20 mg by mouth daily.    Historical Provider, MD  oxyCODONE-acetaminophen (PERCOCET/ROXICET) 5-325 MG per  tablet Take 2 tablets by mouth every 4 (four) hours as needed for severe pain. 08/12/13   Kalman Drape, MD  PARoxetine (PAXIL) 20 MG tablet Take 20 mg by mouth daily.    Historical Provider, MD  phentermine 37.5 MG capsule Take 37.5 mg by mouth every morning.    Historical Provider, MD  traZODone (DESYREL) 100 MG tablet Take 1 tablet (100 mg total) by mouth at bedtime. 06/07/13   Belkys A Regalado, MD   BP 155/84 mmHg  Pulse 73  Temp(Src) 98.9 F (37.2 C) (Oral)  Resp 14  Ht 5\' 8"  (1.727 m)  Wt 320 lb (145.151 kg)  BMI 48.67 kg/m2  SpO2 97% Physical Exam  Constitutional: She is oriented to person, place, and time. She appears well-developed and well-nourished.  Obese  HENT:  Head: Normocephalic and atraumatic.  Mouth/Throat: Oropharynx is clear and moist. No oropharyngeal exudate.  Eyes: Conjunctivae are normal. Pupils are equal, round, and reactive to light. Right eye exhibits no discharge. Left eye exhibits no discharge. No scleral icterus.  Neck: Neck supple.  Cardiovascular: Normal rate, regular rhythm and normal heart sounds.   Pulmonary/Chest: Effort normal and breath sounds normal. No respiratory distress. She has no wheezes. She has no rales.  Abdominal: Soft. There is no tenderness.  Musculoskeletal: She exhibits no tenderness.  Muscle bellies nontender. Compartments soft.  Neurological: She is alert and oriented to person, place, and time.  Cranial Nerves II-XII grossly intact  Skin: Skin is warm and dry.  Small, 1--1.5 cm circumferential ulceration to medial aspect of right calf. No overt erythema or edema appreciated to the site. No evidence of active infection  Psychiatric: She has a normal mood and affect.  Nursing note and vitals reviewed.   ED Course  Procedures (including critical care time) Labs Review Labs Reviewed - No data to display  Imaging Review No results found.   EKG Interpretation None     Meds given in ED:  Medications  traMADol (ULTRAM)  tablet 50 mg (not administered)    New Prescriptions   MUPIROCIN NASAL OINTMENT (BACTROBAN) 2 %    Apply in each nostril daily   Filed Vitals:   02/06/14 0047  BP: 155/84  Pulse: 73  Temp: 98.9 F (37.2 C)  TempSrc: Oral  Resp: 14  Height: 5\' 8"  (1.727 m)  Weight: 320 lb (145.151 kg)  SpO2: 97%    MDM  Kelly Novak is a 43 y.o. female who presents for evaluation of poorly healing dog bite, 3 months ago.  Vitals stable  -afebrile, Discussed BP f/u with PCP Pt resting comfortably in ED. PE--not concerning further acute or emergent pathology. Ulceration is without surrounding erythema, edema or drainage. There does not appear to be any sign of active infection.  Will DC with Bactroban and instructions for wet-to-dry dressings. Ibuprofen for pain and inflammation. Discussed f/u with PCP and return precautions, pt very amenable to plan. Patient stable, in good condition and is appropriate for discharge  Prior to patient discharge, I discussed and reviewed this case with Dr. Tomi Bamberger, who also saw and evaluated the patient    Final diagnoses:  Skin lesion of right leg        Verl Dicker, PA-C 02/06/14 Carter, PA-C 02/06/14 Paragon, MD 02/07/14 [phone]

## 2014-02-14 ENCOUNTER — Ambulatory Visit: Payer: Medicaid Other | Admitting: Nurse Practitioner

## 2014-03-16 ENCOUNTER — Emergency Department (HOSPITAL_COMMUNITY)
Admission: EM | Admit: 2014-03-16 | Discharge: 2014-03-16 | Disposition: A | Discharge status: Home / Self Care | Payer: Medicaid Other | Attending: Emergency Medicine | Admitting: Emergency Medicine

## 2014-03-16 ENCOUNTER — Encounter (HOSPITAL_COMMUNITY): Payer: Self-pay | Admitting: Adult Health

## 2014-03-16 DIAGNOSIS — M1990 Unspecified osteoarthritis, unspecified site: Secondary | ICD-10-CM | POA: Insufficient documentation

## 2014-03-16 DIAGNOSIS — L02415 Cutaneous abscess of right lower limb: Secondary | ICD-10-CM | POA: Diagnosis not present

## 2014-03-16 DIAGNOSIS — K219 Gastro-esophageal reflux disease without esophagitis: Secondary | ICD-10-CM | POA: Diagnosis not present

## 2014-03-16 DIAGNOSIS — L989 Disorder of the skin and subcutaneous tissue, unspecified: Secondary | ICD-10-CM | POA: Diagnosis not present

## 2014-03-16 DIAGNOSIS — Z862 Personal history of diseases of the blood and blood-forming organs and certain disorders involving the immune mechanism: Secondary | ICD-10-CM | POA: Diagnosis not present

## 2014-03-16 DIAGNOSIS — I10 Essential (primary) hypertension: Secondary | ICD-10-CM | POA: Insufficient documentation

## 2014-03-16 DIAGNOSIS — Z79899 Other long term (current) drug therapy: Secondary | ICD-10-CM | POA: Insufficient documentation

## 2014-03-16 DIAGNOSIS — Z792 Long term (current) use of antibiotics: Secondary | ICD-10-CM | POA: Diagnosis not present

## 2014-03-16 DIAGNOSIS — F419 Anxiety disorder, unspecified: Secondary | ICD-10-CM | POA: Diagnosis not present

## 2014-03-16 DIAGNOSIS — Z720 Tobacco use: Secondary | ICD-10-CM | POA: Diagnosis not present

## 2014-03-16 DIAGNOSIS — L97919 Non-pressure chronic ulcer of unspecified part of right lower leg with unspecified severity: Secondary | ICD-10-CM | POA: Diagnosis not present

## 2014-03-16 DIAGNOSIS — G8929 Other chronic pain: Secondary | ICD-10-CM | POA: Insufficient documentation

## 2014-03-16 DIAGNOSIS — L089 Local infection of the skin and subcutaneous tissue, unspecified: Secondary | ICD-10-CM | POA: Diagnosis present

## 2014-03-16 MED ORDER — SULFAMETHOXAZOLE-TRIMETHOPRIM 800-160 MG PO TABS
1.0000 | ORAL_TABLET | Freq: Once | ORAL | Status: AC
  Administered 2014-03-16: 1 via ORAL
  Filled 2014-03-16: qty 1

## 2014-03-16 MED ORDER — OXYCODONE-ACETAMINOPHEN 5-325 MG PO TABS
2.0000 | ORAL_TABLET | Freq: Once | ORAL | Status: AC
  Administered 2014-03-16: 2 via ORAL
  Filled 2014-03-16: qty 2

## 2014-03-16 MED ORDER — OXYCODONE-ACETAMINOPHEN 5-325 MG PO TABS: 1.0000 | ORAL_TABLET | Freq: Four times a day (QID) | ORAL | Status: DC | PRN

## 2014-03-16 MED ORDER — IBUPROFEN 200 MG PO TABS
600.0000 mg | ORAL_TABLET | Freq: Once | ORAL | Status: AC
  Administered 2014-03-16: 600 mg via ORAL
  Filled 2014-03-16: qty 3

## 2014-03-16 MED ORDER — SULFAMETHOXAZOLE-TRIMETHOPRIM 800-160 MG PO TABS: 1.0000 | ORAL_TABLET | Freq: Two times a day (BID) | ORAL | Status: DC

## 2014-03-16 NOTE — ED Notes (Signed)
Presents with inner right thigh induration and erythema that began one week ago associated with pain redness up into groin and thigh.  A second leg wound is on the right lower leg that has been there for 5 months and not healed. denis fevers endorses headache, nause and general malaise

## 2014-03-20 ENCOUNTER — Ambulatory Visit: Payer: Self-pay | Admitting: Nurse Practitioner

## 2014-03-21 NOTE — ED Provider Notes (Signed)
CSN: [id number]     Arrival date & time 03/16/14  1812 History   First MD Initiated Contact with Patient 03/16/14 1838     Chief Complaint  Patient presents with  . Recurrent Skin Infections     (Consider location/radiation/quality/duration/timing/severity/associated sxs/prior Treatment) HPI   44 year old female with skin lesions. 2 areas to her right lower extremity. One to her right shin she first noticed several months ago. Uses mildly ulcerated. She reports it has not steadily healed over several months. Location entrained. Mild localized tenderness. No fevers or chills. With the last several days to about a week she has noticed a painful lesion to her right proximal/medial thigh. 2 is slightly ulcerated. No drainage. She thinks she may have been bitten by an insect possibly a spider. She does not remember specifically sig 1 though.  Past Medical History  Diagnosis Date  . Arthritis   . Hypertension   . Chronic back pain   . Anxiety   . GERD (gastroesophageal reflux disease)   . Shortness of breath     on exertion due to weight  . Blood dyscrasia     sickle cell trait  . Anemia    Past Surgical History  Procedure Laterality Date  . Cholecystectomy    . Cesarean section    . Tubal ligation    . Hysteroscopy N/A 08/23/2013    Procedure: HYSTEROSCOPY D&C WITH HYDROTHERMAL ABLATION;  Surgeon: Osborne Oman, MD;  Location: [facility];  Service: Gynecology;  Laterality: N/A;   Family History  Problem Relation Age of Onset  . CAD Father   . Heart failure Brother   . Hypertension Brother   . CAD Paternal Aunt    History  Substance Use Topics  . Smoking status: Current Every Day Smoker -- 0.50 packs/day for 14 years    Types: Cigarettes  . Smokeless tobacco: Not on file  . Alcohol Use: No   OB History    Gravida Para Term Preterm AB TAB SAB Ectopic Multiple Living   4 4 4       2      Review of Systems  All systems reviewed and negative, other than as noted in  HPI.   Allergies  Review of patient's allergies indicates no known allergies.  Home Medications   Prior to Admission medications   Medication Sig Start Date End Date Taking? Authorizing Provider  albuterol (PROVENTIL HFA;VENTOLIN HFA) 108 (90 BASE) MCG/ACT inhaler Inhale 1-2 puffs into the lungs every 6 (six) hours as needed for wheezing or shortness of breath. 12/15/13  Yes Ezequiel Essex, MD  carvedilol (COREG) 25 MG tablet Take 1 tablet (25 mg total) by mouth 2 (two) times daily with a meal. 06/28/13  Yes Burtis Junes, NP  cloNIDine (CATAPRES) 0.3 MG tablet TAKE ONE TABLET BY MOUTH THREE TIMES DAILY 01/22/14  Yes Thayer Headings, MD  hydrALAZINE (APRESOLINE) 25 MG tablet TAKE THREE TABLETS BY MOUTH EVERY 8 HOURS 01/22/14  Yes Thayer Headings, MD  hydrochlorothiazide (HYDRODIURIL) 50 MG tablet Take 1 tablet (50 mg total) by mouth daily. 06/28/13  Yes Burtis Junes, NP  ibuprofen (ADVIL,MOTRIN) 200 MG tablet Take 400 mg by mouth every 6 (six) hours as needed for moderate pain.   Yes Historical Provider, MD  lisinopril (PRINIVIL,ZESTRIL) 2.5 MG tablet Take 1 tablet (2.5 mg total) by mouth daily. 06/28/13  Yes Burtis Junes, NP  omeprazole (PRILOSEC) 20 MG capsule Take 20 mg by mouth daily.   Yes Historical  Provider, MD  PARoxetine (PAXIL) 20 MG tablet Take 20 mg by mouth daily.   Yes Historical Provider, MD  phentermine 37.5 MG capsule Take 37.5 mg by mouth every morning.   Yes Historical Provider, MD  traZODone (DESYREL) 100 MG tablet Take 1 tablet (100 mg total) by mouth at bedtime. 06/07/13  Yes Belkys A Regalado, MD  amoxicillin-clavulanate (AUGMENTIN) 875-125 MG per tablet Take 1 tablet by mouth every 12 (twelve) hours. 12/15/13   Ezequiel Essex, MD  amoxicillin-clavulanate (AUGMENTIN) 875-125 MG per tablet Take 1 tablet by mouth 2 (two) times daily. One po bid x 7 days 01/11/14   Orlie Dakin, MD  HYDROcodone-acetaminophen (NORCO) 5-325 MG per tablet Take 1-2 tablets by mouth every 6  (six) hours as needed for moderate pain or severe pain. 01/11/14   Orlie Dakin, MD  mupirocin nasal ointment (BACTROBAN) 2 % Apply in each nostril daily 02/06/14   Verl Dicker, PA-C  oxyCODONE-acetaminophen (PERCOCET/ROXICET) 5-325 MG per tablet Take 2 tablets by mouth every 4 (four) hours as needed for severe pain. 08/12/13   Kalman Drape, MD  oxyCODONE-acetaminophen (PERCOCET/ROXICET) 5-325 MG per tablet Take 1-2 tablets by mouth every 6 (six) hours as needed for severe pain. 03/16/14   Virgel Manifold, MD  sulfamethoxazole-trimethoprim (BACTRIM DS,SEPTRA DS) 800-160 MG per tablet Take 1 tablet by mouth 2 (two) times daily. 03/16/14   Virgel Manifold, MD   BP 171/80 mmHg  Pulse 67  Temp(Src) 98.5 F (36.9 C) (Oral)  Resp 16  SpO2 100% Physical Exam  Constitutional: She appears well-developed and well-nourished. No distress.  HENT:  Head: Normocephalic and atraumatic.  Eyes: Conjunctivae are normal. Right eye exhibits no discharge. Left eye exhibits no discharge.  Neck: Neck supple.  Cardiovascular: Normal rate, regular rhythm and normal heart sounds.  Exam reveals no gallop and no friction rub.   No murmur heard. Pulmonary/Chest: Effort normal and breath sounds normal. No respiratory distress.  Abdominal: Soft. She exhibits no distension. There is no tenderness.  Musculoskeletal: She exhibits no edema or tenderness.  Neurological: She is alert.  Skin: Skin is warm and dry.  Superficial ulceration to the right lower leg small lesion to right proximal, medial thigh consistent with an abscess. This is spontaneously draining. There is some mild surrounding induration or localized tenderness. No surrounding cellulitis. No fluctuance. Neurovascular intact distally.  Psychiatric: She has a normal mood and affect. Her behavior is normal. Thought content normal.  Nursing note and vitals reviewed.   ED Course  Procedures (including critical care time) Labs Review Labs Reviewed - No data  to display  Imaging Review No results found.   EKG Interpretation None      MDM   Final diagnoses:  Skin lesions    44 year old female with lesions consistent with abscess. Areas appear to be spontaneously draining. There is mild surrounding induration. No significant drainable collection on my exam. She is afebrile. Nontoxic appearing. Plan antibiotics. Continued wound care was discussed as well as return precautions. Outpatient follow-up otherwise.    Virgel Manifold, MD 03/21/14 1323

## 2014-03-22 ENCOUNTER — Other Ambulatory Visit: Payer: Self-pay | Admitting: Cardiovascular Disease

## 2014-04-13 ENCOUNTER — Other Ambulatory Visit: Payer: Self-pay | Admitting: Cardiovascular Disease

## 2014-04-13 ENCOUNTER — Ambulatory Visit: Payer: Self-pay | Admitting: Nurse Practitioner

## 2014-04-16 ENCOUNTER — Other Ambulatory Visit: Payer: Self-pay | Admitting: Cardiovascular Disease

## 2014-04-20 ENCOUNTER — Other Ambulatory Visit: Payer: Self-pay | Admitting: *Deleted

## 2014-04-20 ENCOUNTER — Telehealth: Payer: Self-pay | Admitting: Nurse Practitioner

## 2014-04-20 MED ORDER — HYDRALAZINE HCL 25 MG PO TABS: ORAL_TABLET | ORAL | Status: DC

## 2014-04-20 MED ORDER — CLONIDINE HCL 0.3 MG PO TABS: 0.3000 mg | ORAL_TABLET | Freq: Three times a day (TID) | ORAL | Status: DC

## 2014-04-20 NOTE — Telephone Encounter (Signed)
Called and spoke with pt and informed her that I spoke with Dr. Elmarie Shiley nurse and she said it was ok for pt to keep appt for 3/4 and see Dr. Acie Fredrickson so that we can get her refills for her medications.  Pt stated that she is almost out of her Clonidine and Hydralazine. Informed pt to call pharmacy and inform them of her appt on 3/4 and that they should give her enough to get her through her appt. Informed pt that if they do not to call our office back. Pt verbalized understanding and was in agreement with this plan.

## 2014-04-20 NOTE — Telephone Encounter (Signed)
New Message  Pt wanted to sched appt w/ Truitt Merle due to medication refill. Pt was offered 4/1 (next avail)- Pt wanted to speak w/ Rn about earlier appt; pt was offered next avail with Dr. Acie Fredrickson- pt wanted to see if this was also okay. Please call back and discuss.

## 2014-04-27 ENCOUNTER — Encounter: Payer: Self-pay | Admitting: Cardiovascular Disease

## 2014-04-27 ENCOUNTER — Ambulatory Visit (INDEPENDENT_AMBULATORY_CARE_PROVIDER_SITE_OTHER): Payer: Medicaid Other | Admitting: Cardiovascular Disease

## 2014-04-27 VITALS — BP 170/120 | HR 64 | Ht 68.0 in | Wt 320.0 lb

## 2014-04-27 DIAGNOSIS — I160 Hypertensive urgency: Secondary | ICD-10-CM

## 2014-04-27 DIAGNOSIS — I10 Essential (primary) hypertension: Secondary | ICD-10-CM

## 2014-04-27 MED ORDER — CLONIDINE HCL 0.3 MG PO TABS: 0.3000 mg | ORAL_TABLET | Freq: Three times a day (TID) | ORAL | Status: DC

## 2014-04-27 MED ORDER — HYDROCHLOROTHIAZIDE 50 MG PO TABS: 50.0000 mg | ORAL_TABLET | Freq: Every day | ORAL | Status: DC

## 2014-04-27 MED ORDER — HYDRALAZINE HCL 25 MG PO TABS: ORAL_TABLET | ORAL | Status: DC

## 2014-04-27 MED ORDER — CARVEDILOL 25 MG PO TABS: 25.0000 mg | ORAL_TABLET | Freq: Two times a day (BID) | ORAL | Status: DC

## 2014-04-27 MED ORDER — LISINOPRIL 2.5 MG PO TABS: 2.5000 mg | ORAL_TABLET | Freq: Every day | ORAL | Status: DC

## 2014-04-27 NOTE — Progress Notes (Signed)
Cardiology Office Note   Date:  04/27/2014   ID:  Kelly Novak, DOB [birth year], MRN [id number]  PCP:  Benito Mccreedy, MD  Cardiologist:   Thayer Headings, MD   Chief Complaint  Patient presents with  . Follow-up    CP , HTN, morbid obesity   Problem list: 1. Hypertension 2. Morbid obesity 3. Chest pain   History of Present Illness: Kelly Novak is a 44 y.o. female who presents for follow up of her HTN She has lost 23 lbs since her hospitalization last year.  She has run out of her meds - its been several months since she last took her meds.   She is still taking the coreg and clonidine.   Still smoking .   Avoids some salt but eats fast food and  pre-prepared foods.     Past Medical History  Diagnosis Date  . Arthritis   . Hypertension   . Chronic back pain   . Anxiety   . GERD (gastroesophageal reflux disease)   . Shortness of breath     on exertion due to weight  . Blood dyscrasia     sickle cell trait  . Anemia     Past Surgical History  Procedure Laterality Date  . Cholecystectomy    . Cesarean section    . Tubal ligation    . Hysteroscopy N/A 08/23/2013    Procedure: HYSTEROSCOPY D&C WITH HYDROTHERMAL ABLATION;  Surgeon: Osborne Oman, MD;  Location: [facility];  Service: Gynecology;  Laterality: N/A;     Current Outpatient Prescriptions  Medication Sig Dispense Refill  . albuterol (PROVENTIL HFA;VENTOLIN HFA) 108 (90 BASE) MCG/ACT inhaler Inhale 1-2 puffs into the lungs every 6 (six) hours as needed for wheezing or shortness of breath. 1 Inhaler 0  . carvedilol (COREG) 25 MG tablet Take 1 tablet (25 mg total) by mouth 2 (two) times daily with a meal. 60 tablet 6  . cloNIDine (CATAPRES) 0.3 MG tablet Take 1 tablet (0.3 mg total) by mouth 3 (three) times daily. 21 tablet 0  . hydrALAZINE (APRESOLINE) 25 MG tablet TAKE THREE TABLETS BY MOUTH EVERY 8 HOURS 21 tablet 0  . hydrochlorothiazide (HYDRODIURIL) 50 MG tablet Take 1 tablet (50 mg total) by  mouth daily. 30 tablet 6  . ibuprofen (ADVIL,MOTRIN) 200 MG tablet Take 400 mg by mouth every 6 (six) hours as needed for moderate pain.    Marland Kitchen lisinopril (PRINIVIL,ZESTRIL) 2.5 MG tablet Take 1 tablet (2.5 mg total) by mouth daily. 30 tablet 6  . omeprazole (PRILOSEC) 20 MG capsule Take 20 mg by mouth daily.    Marland Kitchen PARoxetine (PAXIL) 20 MG tablet Take 20 mg by mouth daily.    . phentermine 37.5 MG capsule Take 37.5 mg by mouth every morning.    . traZODone (DESYREL) 100 MG tablet Take 1 tablet (100 mg total) by mouth at bedtime. 30 tablet 0  . oxyCODONE-acetaminophen (PERCOCET/ROXICET) 5-325 MG per tablet Take 1-2 tablets by mouth every 6 (six) hours as needed for severe pain. (Patient not taking: Reported on 04/27/2014) 15 tablet 0   No current facility-administered medications for this visit.    Allergies:   Review of patient's allergies indicates no known allergies.    Social History:  The patient  reports that she has been smoking Cigarettes.  She has a 7 pack-year smoking history. She does not have any smokeless tobacco history on file. She reports that she does not drink alcohol or use illicit drugs.  Family History:  The patient's family history includes CAD in her father and paternal aunt; Heart failure in her brother; Hypertension in her brother.    ROS:  Please see the history of present illness.    Review of Systems: Constitutional:  denies fever, chills, diaphoresis, appetite change and fatigue.  HEENT: denies photophobia, eye pain, redness, hearing loss, ear pain, congestion, sore throat, rhinorrhea, sneezing, neck pain, neck stiffness and tinnitus.  Respiratory: denies SOB, DOE, cough, chest tightness, and wheezing.  Cardiovascular: denies chest pain, palpitations and leg swelling.  Gastrointestinal: denies nausea, vomiting, abdominal pain, diarrhea, constipation, blood in stool.  Genitourinary: denies dysuria, urgency, frequency, hematuria, flank pain and difficulty urinating.   Musculoskeletal: denies  myalgias, back pain, joint swelling, arthralgias and gait problem.   Skin: denies pallor, rash and wound.  Neurological: denies dizziness, seizures, syncope, weakness, light-headedness, numbness and headaches.   Hematological: denies adenopathy, easy bruising, personal or family bleeding history.  Psychiatric/ Behavioral: denies suicidal ideation, mood changes, confusion, nervousness, sleep disturbance and agitation.       All other systems are reviewed and negative.    PHYSICAL EXAM: VS:  BP 170/120 mmHg  Pulse 64  Ht 5\' 8"  (1.727 m)  Wt 320 lb (145.151 kg)  BMI 48.67 kg/m2 , BMI Body mass index is 48.67 kg/(m^2). GEN: Well nourished, well developed, in no acute distress HEENT: normal Neck: no JVD, carotid bruits, or masses Cardiac: RRR; no murmurs, rubs, or gallops,no edema  Respiratory:  clear to auscultation bilaterally, normal work of breathing GI: soft, nontender, nondistended, + BS MS: no deformity or atrophy Skin: warm and dry, no rash Neuro:  Strength and sensation are intact Psych: normal   EKG:  EKG is not ordered today.    Recent Labs: 06/06/2013: TSH 0.890 12/15/2013: Pro B Natriuretic peptide (BNP) 278.0* 01/10/2014: ALT 15; BUN 6; Creatinine 0.69; Hemoglobin 10.9*; Platelets 330; Potassium 3.8; Sodium 137    Lipid Panel    Component Value Date/Time   CHOL 159 06/06/2013 0535   TRIG 144 06/06/2013 0535   HDL 53 06/06/2013 0535   CHOLHDL 3.0 06/06/2013 0535   VLDL 29 06/06/2013 0535   LDLCALC 77 06/06/2013 0535      Wt Readings from Last 3 Encounters:  04/27/14 320 lb (145.151 kg)  02/06/14 320 lb (145.151 kg)  12/15/13 325 lb (147.419 kg)      Other studies Reviewed: Additional studies/ records that were reviewed today include: . Review of the above records demonstrates:    ASSESSMENT AND PLAN:  1.  Essential hypertension: The patient presents with elevated blood pressure. She's been out of some of her  medications since November.  She has still been taking her clonidine and carvedilol.  We will refill her cardiac meds. I've encouraged her to get a primary medical doctor. I think that her blood pressure medicines can be easily managed by primary care.  As a precaution, I will set her up appointment to see me again in one year. We'll cancel that point if she has been able to establish with a general medical doctor. I'll be happy to see her often if needed.  2. Morbid obesity: The patient has been successful in losing some weight. I encouraged her to continue with weight loss efforts.    Current medicines are reviewed at length with the patient today.  The patient does not have concerns regarding medicines.  The following changes have been made:  no change   Disposition:   FU with me  in 1 year    Signed, Dom Haverland, Wonda Cheng, MD  04/27/2014 2:32 PM    [facility] [address], [place], [place]  [address] Phone: [phone]; Fax: [phone]

## 2014-04-27 NOTE — Patient Instructions (Signed)
Your physician recommends that you continue on your current medications as directed. Please refer to the Current Medication list given to you today.  Your physician wants you to follow-up in: 1 year with Dr. Nahser.  You will receive a reminder letter in the mail two months in advance. If you don't receive a letter, please call our office to schedule the follow-up appointment.  

## 2014-10-04 ENCOUNTER — Emergency Department (HOSPITAL_COMMUNITY)
Admission: EM | Admit: 2014-10-04 | Discharge: 2014-10-04 | Disposition: A | Discharge status: Home / Self Care | Payer: Medicaid Other | Attending: Emergency Medicine | Admitting: Emergency Medicine

## 2014-10-04 ENCOUNTER — Encounter (HOSPITAL_COMMUNITY): Payer: Self-pay | Admitting: *Deleted

## 2014-10-04 ENCOUNTER — Emergency Department (HOSPITAL_COMMUNITY): Payer: Medicaid Other

## 2014-10-04 DIAGNOSIS — S3992XA Unspecified injury of lower back, initial encounter: Secondary | ICD-10-CM | POA: Insufficient documentation

## 2014-10-04 DIAGNOSIS — F419 Anxiety disorder, unspecified: Secondary | ICD-10-CM | POA: Insufficient documentation

## 2014-10-04 DIAGNOSIS — Y9389 Activity, other specified: Secondary | ICD-10-CM | POA: Insufficient documentation

## 2014-10-04 DIAGNOSIS — I10 Essential (primary) hypertension: Secondary | ICD-10-CM | POA: Diagnosis not present

## 2014-10-04 DIAGNOSIS — M1990 Unspecified osteoarthritis, unspecified site: Secondary | ICD-10-CM | POA: Insufficient documentation

## 2014-10-04 DIAGNOSIS — S93402A Sprain of unspecified ligament of left ankle, initial encounter: Secondary | ICD-10-CM | POA: Insufficient documentation

## 2014-10-04 DIAGNOSIS — Z862 Personal history of diseases of the blood and blood-forming organs and certain disorders involving the immune mechanism: Secondary | ICD-10-CM | POA: Insufficient documentation

## 2014-10-04 DIAGNOSIS — Z79899 Other long term (current) drug therapy: Secondary | ICD-10-CM | POA: Diagnosis not present

## 2014-10-04 DIAGNOSIS — S199XXA Unspecified injury of neck, initial encounter: Secondary | ICD-10-CM | POA: Diagnosis not present

## 2014-10-04 DIAGNOSIS — Y92512 Supermarket, store or market as the place of occurrence of the external cause: Secondary | ICD-10-CM | POA: Diagnosis not present

## 2014-10-04 DIAGNOSIS — Z720 Tobacco use: Secondary | ICD-10-CM | POA: Insufficient documentation

## 2014-10-04 DIAGNOSIS — G8929 Other chronic pain: Secondary | ICD-10-CM | POA: Diagnosis not present

## 2014-10-04 DIAGNOSIS — F319 Bipolar disorder, unspecified: Secondary | ICD-10-CM | POA: Insufficient documentation

## 2014-10-04 DIAGNOSIS — K219 Gastro-esophageal reflux disease without esophagitis: Secondary | ICD-10-CM | POA: Diagnosis not present

## 2014-10-04 DIAGNOSIS — Y998 Other external cause status: Secondary | ICD-10-CM | POA: Diagnosis not present

## 2014-10-04 DIAGNOSIS — M791 Myalgia, unspecified site: Secondary | ICD-10-CM

## 2014-10-04 DIAGNOSIS — S99912A Unspecified injury of left ankle, initial encounter: Secondary | ICD-10-CM | POA: Diagnosis present

## 2014-10-04 DIAGNOSIS — S0990XA Unspecified injury of head, initial encounter: Secondary | ICD-10-CM | POA: Diagnosis not present

## 2014-10-04 HISTORY — DX: Bipolar disorder, unspecified: F31.9

## 2014-10-04 MED ORDER — HYDROCODONE-ACETAMINOPHEN 5-325 MG PO TABS
1.0000 | ORAL_TABLET | ORAL | Status: DC | PRN
Start: 2014-10-04 — End: 2015-03-16

## 2014-10-04 MED ORDER — IBUPROFEN 800 MG PO TABS
800.0000 mg | ORAL_TABLET | Freq: Once | ORAL | Status: AC
  Administered 2014-10-04: 800 mg via ORAL
  Filled 2014-10-04: qty 1

## 2014-10-04 MED ORDER — CYCLOBENZAPRINE HCL 10 MG PO TABS
10.0000 mg | ORAL_TABLET | Freq: Two times a day (BID) | ORAL | Status: DC | PRN
Start: 2014-10-04 — End: 2015-11-04

## 2014-10-04 MED ORDER — IBUPROFEN 800 MG PO TABS: 800.0000 mg | ORAL_TABLET | Freq: Three times a day (TID) | ORAL | Status: DC

## 2014-10-04 MED ORDER — HYDROCODONE-ACETAMINOPHEN 5-325 MG PO TABS
1.0000 | ORAL_TABLET | Freq: Once | ORAL | Status: AC
Start: 2014-10-04 — End: 2014-10-04
  Administered 2014-10-04: 1 via ORAL
  Filled 2014-10-04: qty 1

## 2014-10-04 NOTE — ED Notes (Signed)
Given 2 ice bags and a work note. No other c/c. AVS explained in detail.
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
## 2014-10-04 NOTE — Discharge Instructions (Signed)
Ankle Sprain °An ankle sprain is an injury to the strong, fibrous tissues (ligaments) that hold the bones of your ankle joint together.  °CAUSES °An ankle sprain is usually caused by a fall or by twisting your ankle. Ankle sprains most commonly occur when you step on the outer edge of your foot, and your ankle turns inward. People who participate in sports are more prone to these types of injuries.  °SYMPTOMS  °· Pain in your ankle. The pain may be present at rest or only when you are trying to stand or walk. °· Swelling. °· Bruising. Bruising may develop immediately or within 1 to 2 days after your injury. °· Difficulty standing or walking, particularly when turning corners or changing directions. °DIAGNOSIS  °Your caregiver will ask you details about your injury and perform a physical exam of your ankle to determine if you have an ankle sprain. During the physical exam, your caregiver will press on and apply pressure to specific areas of your foot and ankle. Your caregiver will try to move your ankle in certain ways. An X-ray exam may be done to be sure a bone was not broken or a ligament did not separate from one of the bones in your ankle (avulsion fracture).  °TREATMENT  °Certain types of braces can help stabilize your ankle. Your caregiver can make a recommendation for this. Your caregiver may recommend the use of medicine for pain. If your sprain is severe, your caregiver may refer you to a surgeon who helps to restore function to parts of your skeletal system (orthopedist) or a physical therapist. °HOME CARE INSTRUCTIONS  °· Apply ice to your injury for 1-2 days or as directed by your caregiver. Applying ice helps to reduce inflammation and pain. °· Put ice in a plastic bag. °· Place a towel between your skin and the bag. °· Leave the ice on for 15-20 minutes at a time, every 2 hours while you are awake. °· Only take over-the-counter or prescription medicines for pain, discomfort, or fever as directed by  your caregiver. °· Elevate your injured ankle above the level of your heart as much as possible for 2-3 days. °· If your caregiver recommends crutches, use them as instructed. Gradually put weight on the affected ankle. Continue to use crutches or a cane until you can walk without feeling pain in your ankle. °· If you have a plaster splint, wear the splint as directed by your caregiver. Do not rest it on anything harder than a pillow for the first 24 hours. Do not put weight on it. Do not get it wet. You may take it off to take a shower or bath. °· You may have been given an elastic bandage to wear around your ankle to provide support. If the elastic bandage is too tight (you have numbness or tingling in your foot or your foot becomes cold and blue), adjust the bandage to make it comfortable. °· If you have an air splint, you may blow more air into it or let air out to make it more comfortable. You may take your splint off at night and before taking a shower or bath. Wiggle your toes in the splint several times per day to decrease swelling. °SEEK MEDICAL CARE IF:  °· You have rapidly increasing bruising or swelling. °· Your toes feel extremely cold or you lose feeling in your foot. °· Your pain is not relieved with medicine. °SEEK IMMEDIATE MEDICAL CARE IF: °· Your toes are numb or blue. °·   You have severe pain that is increasing. MAKE SURE YOU:   Understand these instructions.  Will watch your condition.  Will get help right away if you are not doing well or get worse. Document Released: 02/09/2005 Document Revised: 11/04/2011 Document Reviewed: 02/21/2011 [facility] Patient Information 2015 [place], [place]. This information is not intended to replace advice given to you by your health care provider. Make sure you discuss any questions you have with your health care provider. Assault, General Assault includes any behavior, whether intentional or reckless, which results in bodily injury to another person  and/or damage to property. Included in this would be any behavior, intentional or reckless, that by its nature would be understood (interpreted) by a reasonable person as intent to harm another person or to damage his/her property. Threats may be oral or written. They may be communicated through regular mail, computer, fax, or phone. These threats may be direct or implied. FORMS OF ASSAULT INCLUDE:  Physically assaulting a person. This includes physical threats to inflict physical harm as well as:  Slapping.  Hitting.  Poking.  Kicking.  Punching.  Pushing.  Arson.  Sabotage.  Equipment vandalism.  Damaging or destroying property.  Throwing or hitting objects.  Displaying a weapon or an object that appears to be a weapon in a threatening manner.  Carrying a firearm of any kind.  Using a weapon to harm someone.  Using greater physical size/strength to intimidate another.  Making intimidating or threatening gestures.  Bullying.  Hazing.  Intimidating, threatening, hostile, or abusive language directed toward another person.  It communicates the intention to engage in violence against that person. And it leads a reasonable person to expect that violent behavior may occur.  Stalking another person. IF IT HAPPENS AGAIN:  Immediately call for emergency help (911 in [place]  If someone poses clear and immediate danger to you, seek legal authorities to have a protective or restraining order put in place.  Less threatening assaults can at least be reported to authorities. STEPS TO TAKE IF A SEXUAL ASSAULT HAS HAPPENED  Go to an area of safety. This may include a shelter or staying with a friend. Stay away from the area where you have been attacked. A large percentage of sexual assaults are caused by a friend, relative or associate.  If medications were given by your caregiver, take them as directed for the full length of time prescribed.  Only take over-the-counter or  prescription medicines for pain, discomfort, or fever as directed by your caregiver.  If you have come in contact with a sexual disease, find out if you are to be tested again. If your caregiver is concerned about the HIV/AIDS virus, he/she may require you to have continued testing for several months.  For the protection of your privacy, test results can not be given over the phone. Make sure you receive the results of your test. If your test results are not back during your visit, make an appointment with your caregiver to find out the results. Do not assume everything is normal if you have not heard from your caregiver or the medical facility. It is important for you to follow up on all of your test results.  File appropriate papers with authorities. This is important in all assaults, even if it has occurred in a family or by a friend. SEEK MEDICAL CARE IF:  You have new problems because of your injuries.  You have problems that may be because of the medicine you are taking,  such as:  Rash.  Itching.  Swelling.  Trouble breathing.  You develop belly (abdominal) pain, feel sick to your stomach (nausea) or are vomiting.  You begin to run a temperature.  You need supportive care or referral to a rape crisis center. These are centers with trained personnel who can help you get through this ordeal. SEEK IMMEDIATE MEDICAL CARE IF:  You are afraid of being threatened, beaten, or abused. In [place] call 911.  You receive new injuries related to abuse.  You develop severe pain in any area injured in the assault or have any change in your condition that concerns you.  You faint or lose consciousness.  You develop chest pain or shortness of breath. Document Released: 02/09/2005 Document Revised: 05/04/2011 Document Reviewed: 09/28/2007 [facility] Patient Information 2015 [place], [place]. This information is not intended to replace advice given to you by your health care provider. Make sure  you discuss any questions you have with your health care provider. Cryotherapy Cryotherapy means treatment with cold. Ice or gel packs can be used to reduce both pain and swelling. Ice is the most helpful within the first 24 to 48 hours after an injury or flare-up from overusing a muscle or joint. Sprains, strains, spasms, burning pain, shooting pain, and aches can all be eased with ice. Ice can also be used when recovering from surgery. Ice is effective, has very few side effects, and is safe for most people to use. PRECAUTIONS  Ice is not a safe treatment option for people with:  Raynaud phenomenon. This is a condition affecting small blood vessels in the extremities. Exposure to cold may cause your problems to return.  Cold hypersensitivity. There are many forms of cold hypersensitivity, including:  Cold urticaria. Red, itchy hives appear on the skin when the tissues begin to warm after being iced.  Cold erythema. This is a red, itchy rash caused by exposure to cold.  Cold hemoglobinuria. Red blood cells break down when the tissues begin to warm after being iced. The hemoglobin that carry oxygen are passed into the urine because they cannot combine with blood proteins fast enough.  Numbness or altered sensitivity in the area being iced. If you have any of the following conditions, do not use ice until you have discussed cryotherapy with your caregiver:  Heart conditions, such as arrhythmia, angina, or chronic heart disease.  High blood pressure.  Healing wounds or open skin in the area being iced.  Current infections.  Rheumatoid arthritis.  Poor circulation.  Diabetes. Ice slows the blood flow in the region it is applied. This is beneficial when trying to stop inflamed tissues from spreading irritating chemicals to surrounding tissues. However, if you expose your skin to cold temperatures for too long or without the proper protection, you can damage your skin or nerves. Watch for  signs of skin damage due to cold. HOME CARE INSTRUCTIONS Follow these tips to use ice and cold packs safely.  Place a dry or damp towel between the ice and skin. A damp towel will cool the skin more quickly, so you may need to shorten the time that the ice is used.  For a more rapid response, add gentle compression to the ice.  Ice for no more than 10 to 20 minutes at a time. The bonier the area you are icing, the less time it will take to get the benefits of ice.  Check your skin after 5 minutes to make sure there are no signs of a  poor response to cold or skin damage.  Rest 20 minutes or more between uses.  Once your skin is numb, you can end your treatment. You can test numbness by very lightly touching your skin. The touch should be so light that you do not see the skin dimple from the pressure of your fingertip. When using ice, most people will feel these normal sensations in this order: cold, burning, aching, and numbness.  Do not use ice on someone who cannot communicate their responses to pain, such as small children or people with dementia. HOW TO MAKE AN ICE PACK Ice packs are the most common way to use ice therapy. Other methods include ice massage, ice baths, and cryosprays. Muscle creams that cause a cold, tingly feeling do not offer the same benefits that ice offers and should not be used as a substitute unless recommended by your caregiver. To make an ice pack, do one of the following:  Place crushed ice or a bag of frozen vegetables in a sealable plastic bag. Squeeze out the excess air. Place this bag inside another plastic bag. Slide the bag into a pillowcase or place a damp towel between your skin and the bag.  Mix 3 parts water with 1 part rubbing alcohol. Freeze the mixture in a sealable plastic bag. When you remove the mixture from the freezer, it will be slushy. Squeeze out the excess air. Place this bag inside another plastic bag. Slide the bag into a pillowcase or place  a damp towel between your skin and the bag. SEEK MEDICAL CARE IF:  You develop white spots on your skin. This may give the skin a blotchy (mottled) appearance.  Your skin turns blue or pale.  Your skin becomes waxy or hard.  Your swelling gets worse. MAKE SURE YOU:   Understand these instructions.  Will watch your condition.  Will get help right away if you are not doing well or get worse. Document Released: 10/06/2010 Document Revised: 06/26/2013 Document Reviewed: 10/06/2010 [facility] Patient Information 2015 [place], [place]. This information is not intended to replace advice given to you by your health care provider. Make sure you discuss any questions you have with your health care provider.
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
## 2014-11-18 ENCOUNTER — Emergency Department (HOSPITAL_COMMUNITY): Payer: Medicaid Other

## 2014-11-18 ENCOUNTER — Encounter (HOSPITAL_COMMUNITY): Payer: Self-pay

## 2014-11-18 ENCOUNTER — Emergency Department (HOSPITAL_COMMUNITY)
Admission: EM | Admit: 2014-11-18 | Discharge: 2014-11-18 | Disposition: A | Discharge status: Home / Self Care | Payer: Medicaid Other | Attending: Emergency Medicine | Admitting: Emergency Medicine

## 2014-11-18 DIAGNOSIS — Z720 Tobacco use: Secondary | ICD-10-CM | POA: Insufficient documentation

## 2014-11-18 DIAGNOSIS — G8929 Other chronic pain: Secondary | ICD-10-CM | POA: Diagnosis not present

## 2014-11-18 DIAGNOSIS — M79602 Pain in left arm: Secondary | ICD-10-CM

## 2014-11-18 DIAGNOSIS — F319 Bipolar disorder, unspecified: Secondary | ICD-10-CM | POA: Diagnosis not present

## 2014-11-18 DIAGNOSIS — K219 Gastro-esophageal reflux disease without esophagitis: Secondary | ICD-10-CM | POA: Insufficient documentation

## 2014-11-18 DIAGNOSIS — Z862 Personal history of diseases of the blood and blood-forming organs and certain disorders involving the immune mechanism: Secondary | ICD-10-CM | POA: Diagnosis not present

## 2014-11-18 DIAGNOSIS — M79601 Pain in right arm: Secondary | ICD-10-CM | POA: Diagnosis present

## 2014-11-18 DIAGNOSIS — F419 Anxiety disorder, unspecified: Secondary | ICD-10-CM | POA: Diagnosis not present

## 2014-11-18 DIAGNOSIS — I10 Essential (primary) hypertension: Secondary | ICD-10-CM | POA: Insufficient documentation

## 2014-11-18 DIAGNOSIS — M1990 Unspecified osteoarthritis, unspecified site: Secondary | ICD-10-CM | POA: Diagnosis not present

## 2014-11-18 DIAGNOSIS — Z79899 Other long term (current) drug therapy: Secondary | ICD-10-CM | POA: Diagnosis not present

## 2014-11-18 MED ORDER — CYCLOBENZAPRINE HCL 10 MG PO TABS
10.0000 mg | ORAL_TABLET | Freq: Once | ORAL | Status: AC
  Administered 2014-11-18: 10 mg via ORAL
  Filled 2014-11-18: qty 1

## 2014-11-18 MED ORDER — KETOROLAC TROMETHAMINE 10 MG PO TABS
10.0000 mg | ORAL_TABLET | Freq: Four times a day (QID) | ORAL | Status: DC | PRN
Start: 2014-11-18 — End: 2015-03-16

## 2014-11-18 MED ORDER — KETOROLAC TROMETHAMINE 60 MG/2ML IM SOLN
60.0000 mg | Freq: Once | INTRAMUSCULAR | Status: AC
  Administered 2014-11-18: 60 mg via INTRAMUSCULAR
  Filled 2014-11-18: qty 2

## 2014-11-18 NOTE — ED Notes (Signed)
Pt reports bilateral upper arm pain constant for 3 days. She states her arms appear more swollen then usual; associated with nausea, fatigue, headache, "I just dont feel like myself."

## 2014-11-18 NOTE — Discharge Instructions (Signed)
You were evaluated in the ED today and there does not appear to be an emergent cause her symptoms at this time. Your exam and x-rays were very reassuring. Please take your medications as prescribed. Follow with your doctor in 2 days reevaluation. Return to ED for worsening symptoms.  Musculoskeletal Pain Musculoskeletal pain is muscle and boney aches and pains. These pains can occur in any part of the body. Your caregiver may treat you without knowing the cause of the pain. They may treat you if blood or urine tests, X-rays, and other tests were normal.  CAUSES There is often not a definite cause or reason for these pains. These pains may be caused by a type of germ (virus). The discomfort may also come from overuse. Overuse includes working out too hard when your body is not fit. Boney aches also come from weather changes. Bone is sensitive to atmospheric pressure changes. HOME CARE INSTRUCTIONS   Ask when your test results will be ready. Make sure you get your test results.  Only take over-the-counter or prescription medicines for pain, discomfort, or fever as directed by your caregiver. If you were given medications for your condition, do not drive, operate machinery or power tools, or sign legal documents for 24 hours. Do not drink alcohol. Do not take sleeping pills or other medications that may interfere with treatment.  Continue all activities unless the activities cause more pain. When the pain lessens, slowly resume normal activities. Gradually increase the intensity and duration of the activities or exercise.  During periods of severe pain, bed rest may be helpful. Lay or sit in any position that is comfortable.  Putting ice on the injured area.  Put ice in a bag.  Place a towel between your skin and the bag.  Leave the ice on for 15 to 20 minutes, 3 to 4 times a day.  Follow up with your caregiver for continued problems and no reason can be found for the pain. If the pain becomes  worse or does not go away, it may be necessary to repeat tests or do additional testing. Your caregiver may need to look further for a possible cause. SEEK IMMEDIATE MEDICAL CARE IF:  You have pain that is getting worse and is not relieved by medications.  You develop chest pain that is associated with shortness or breath, sweating, feeling sick to your stomach (nauseous), or throw up (vomit).  Your pain becomes localized to the abdomen.  You develop any new symptoms that seem different or that concern you. MAKE SURE YOU:   Understand these instructions.  Will watch your condition.  Will get help right away if you are not doing well or get worse. Document Released: 02/09/2005 Document Revised: 05/04/2011 Document Reviewed: 10/14/2012 [facility] Patient Information 2015 [place], [place]. This information is not intended to replace advice given to you by your health care provider. Make sure you discuss any questions you have with your health care provider.  Pain of Unknown Etiology (Pain Without a Known Cause) You have come to your caregiver because of pain. Pain can occur in any part of the body. Often there is not a definite cause. If your laboratory (blood or urine) work was normal and X-rays or other studies were normal, your caregiver may treat you without knowing the cause of the pain. An example of this is the headache. Most headaches are diagnosed by taking a history. This means your caregiver asks you questions about your headaches. Your caregiver determines a treatment  based on your answers. Usually testing done for headaches is normal. Often testing is not done unless there is no response to medications. Regardless of where your pain is located today, you can be given medications to make you comfortable. If no physical cause of pain can be found, most cases of pain will gradually leave as suddenly as they came.  If you have a painful condition and no reason can be found for the pain, it  is important that you follow up with your caregiver. If the pain becomes worse or does not go away, it may be necessary to repeat tests and look further for a possible cause.  Only take over-the-counter or prescription medicines for pain, discomfort, or fever as directed by your caregiver.  For the protection of your privacy, test results cannot be given over the phone. Make sure you receive the results of your test. Ask how these results are to be obtained if you have not been informed. It is your responsibility to obtain your test results.  You may continue all activities unless the activities cause more pain. When the pain lessens, it is important to gradually resume normal activities. Resume activities by beginning slowly and gradually increasing the intensity and duration of the activities or exercise. During periods of severe pain, bed rest may be helpful. Lie or sit in any position that is comfortable.  Ice used for acute (sudden) conditions may be effective. Use a large plastic bag filled with ice and wrapped in a towel. This may provide pain relief.  See your caregiver for continued problems. Your caregiver can help or refer you for exercises or physical therapy if necessary. If you were given medications for your condition, do not drive, operate machinery or power tools, or sign legal documents for 24 hours. Do not drink alcohol, take sleeping pills, or take other medications that may interfere with treatment. See your caregiver immediately if you have pain that is becoming worse and not relieved by medications. Document Released: 11/04/2000 Document Revised: 11/30/2012 Document Reviewed: 02/09/2005 [facility] Patient Information 2015 [place], [place]. This information is not intended to replace advice given to you by your health care provider. Make sure you discuss any questions you have with your health care provider.

## 2014-11-18 NOTE — ED Provider Notes (Signed)
CSN: [id number]     Arrival date & time 11/18/14  1813 History   First MD Initiated Contact with Patient 11/18/14 1842     Chief Complaint  Patient presents with  . Arm Pain     (Consider location/radiation/quality/duration/timing/severity/associated sxs/prior Treatment) HPI Kelly Novak is a 44 y.o. female with a history of chronic back pain, morbid obesity, comes in for evaluation for bilateral upper arm pain. Patient says for the past 3 days she has had gradually worsening bilateral upper arm pain. She has used tramadol with some relief of her symptoms as well as muscle rubs. Movement exacerbates her symptoms. She reports 10/10 pain now. She denies any fevers, chills, recent travel, environment exposures, tick bites, rashes, numbness or weakness, abdominal pain, nausea or vomiting. No other aggravating or modifying factors.  Past Medical History  Diagnosis Date  . Arthritis   . Hypertension   . Chronic back pain   . Anxiety   . GERD (gastroesophageal reflux disease)   . Shortness of breath     on exertion due to weight  . Blood dyscrasia     sickle cell trait  . Anemia   . Bipolar 1 disorder    Past Surgical History  Procedure Laterality Date  . Cholecystectomy    . Cesarean section    . Tubal ligation    . Hysteroscopy N/A 08/23/2013    Procedure: HYSTEROSCOPY D&C WITH HYDROTHERMAL ABLATION;  Surgeon: Osborne Oman, MD;  Location: [facility];  Service: Gynecology;  Laterality: N/A;   Family History  Problem Relation Age of Onset  . CAD Father   . Heart failure Brother   . Hypertension Brother   . CAD Paternal Aunt    Social History  Substance Use Topics  . Smoking status: Current Every Day Smoker -- 0.50 packs/day for 14 years    Types: Cigarettes  . Smokeless tobacco: None  . Alcohol Use: No   OB History    Gravida Para Term Preterm AB TAB SAB Ectopic Multiple Living   4 4 4       2      Review of Systems A 10 point review of systems was completed and was  negative except for pertinent positives and negatives as mentioned in the history of present illness     Allergies  Review of patient's allergies indicates no known allergies.  Home Medications   Prior to Admission medications   Medication Sig Start Date End Date Taking? Authorizing Provider  albuterol (PROVENTIL HFA;VENTOLIN HFA) 108 (90 BASE) MCG/ACT inhaler Inhale 1-2 puffs into the lungs every 6 (six) hours as needed for wheezing or shortness of breath. 12/15/13   Ezequiel Essex, MD  carvedilol (COREG) 25 MG tablet Take 1 tablet (25 mg total) by mouth 2 (two) times daily with a meal. 04/27/14   Thayer Headings, MD  cloNIDine (CATAPRES) 0.3 MG tablet Take 1 tablet (0.3 mg total) by mouth 3 (three) times daily. 04/27/14   Thayer Headings, MD  cyclobenzaprine (FLEXERIL) 10 MG tablet Take 1 tablet (10 mg total) by mouth 2 (two) times daily as needed for muscle spasms. 10/04/14   Charlann Lange, PA-C  hydrALAZINE (APRESOLINE) 25 MG tablet TAKE THREE TABLETS BY MOUTH EVERY 8 HOURS 04/27/14   Thayer Headings, MD  hydrochlorothiazide (HYDRODIURIL) 50 MG tablet Take 1 tablet (50 mg total) by mouth daily. 04/27/14   Thayer Headings, MD  HYDROcodone-acetaminophen (NORCO/VICODIN) 5-325 MG per tablet Take 1-2 tablets by mouth every 4 (  four) hours as needed. 10/04/14   Charlann Lange, PA-C  ibuprofen (ADVIL,MOTRIN) 800 MG tablet Take 1 tablet (800 mg total) by mouth 3 (three) times daily. 10/04/14   Charlann Lange, PA-C  ketorolac (TORADOL) 10 MG tablet Take 1 tablet (10 mg total) by mouth every 6 (six) hours as needed. 11/18/14   Comer Locket, PA-C  lisinopril (PRINIVIL,ZESTRIL) 2.5 MG tablet Take 1 tablet (2.5 mg total) by mouth daily. 04/27/14   Thayer Headings, MD  omeprazole (PRILOSEC) 20 MG capsule Take 20 mg by mouth daily.    Historical Provider, MD  oxyCODONE-acetaminophen (PERCOCET/ROXICET) 5-325 MG per tablet Take 1-2 tablets by mouth every 6 (six) hours as needed for severe pain. Patient not taking:  Reported on 04/27/2014 03/16/14   Virgel Manifold, MD  PARoxetine (PAXIL) 20 MG tablet Take 20 mg by mouth daily.    Historical Provider, MD  phentermine 37.5 MG capsule Take 37.5 mg by mouth every morning.    Historical Provider, MD  traZODone (DESYREL) 100 MG tablet Take 1 tablet (100 mg total) by mouth at bedtime. 06/07/13   Belkys A Regalado, MD   BP 168/99 mmHg  Pulse 90  Temp(Src) 98.1 F (36.7 C) (Oral)  Resp 17  Ht 5\' 7"  (1.702 m)  Wt 299 lb (135.626 kg)  BMI 46.82 kg/m2  SpO2 98% Physical Exam  Constitutional: She is oriented to person, place, and time. She appears well-developed and well-nourished.  Morbidly obese African-American female  HENT:  Head: Normocephalic and atraumatic.  Mouth/Throat: Oropharynx is clear and moist.  Eyes: Conjunctivae are normal. Pupils are equal, round, and reactive to light. Right eye exhibits no discharge. Left eye exhibits no discharge. No scleral icterus.  Neck: Neck supple.  Cardiovascular: Normal rate, regular rhythm and normal heart sounds.   Pulmonary/Chest: Effort normal and breath sounds normal. No respiratory distress. She has no wheezes. She has no rales.  Abdominal: Soft. There is no tenderness.  Musculoskeletal: She exhibits no tenderness.  Full range of motion of bilateral upper extremities. No focal tenderness noted. No erythema, no gross edema or overt warmth. No axillary lymphadenopathy. Muscle compartments are soft. Distal pulses intact and equal bilaterally.  Neurological: She is alert and oriented to person, place, and time.  Cranial Nerves II-XII grossly intact  Skin: Skin is warm and dry. No rash noted.  Psychiatric: She has a normal mood and affect.  Nursing note and vitals reviewed.   ED Course  Procedures (including critical care time) Labs Review Labs Reviewed - No data to display  Imaging Review Dg Humerus Left  11/18/2014   CLINICAL DATA:  Bilateral upper arm pain for several weeks. No known injury. Initial  encounter.  EXAM: LEFT HUMERUS - 2+ VIEW  COMPARISON:  None.  FINDINGS: There is no evidence of fracture or other focal bone lesions. Soft tissues are unremarkable.  IMPRESSION: Negative exam.   Electronically Signed   By: Inge Rise M.D.   On: 11/18/2014 20:25   Dg Humerus Right  11/18/2014   CLINICAL DATA:  Midshaft upper arm pain bilaterally for several weeks. No known injury. Initial encounter.  EXAM: RIGHT HUMERUS - 2+ VIEW  COMPARISON:  None.  FINDINGS: There is no evidence of fracture or other focal bone lesions. Soft tissues are unremarkable.  IMPRESSION: Negative exam.   Electronically Signed   By: Inge Rise M.D.   On: 11/18/2014 20:23   I have personally reviewed and evaluated these images and lab results as part of my medical  decision-making.   EKG Interpretation None       Filed Vitals:   11/18/14 1823 11/18/14 1936 11/18/14 2038  BP: 184/106 165/90 168/99  Pulse: 67 59 90  Temp: 98.1 F (36.7 C)    TempSrc: Oral    Resp: 18 18 17   Height: 5\' 7"  (1.702 m)    Weight: 299 lb (135.626 kg)    SpO2: 99% 98% 98%    Patient reports she has not taken her blood pressure medicine today. MDM  Vitals stable  -afebrile Pt resting comfortably in ED. PE: FROM of bil arms. No overt erythema, swelling. Pulses intact. Muscles compartments soft. Imaging--Pending xrays of bil. Humerus.  Doubt DVT, Compartment syndrome. Pt sx likely MSK in nature. Pt care signed out to [facility], PA-C, for f/u on xrays and if no new objective findings, pt may be DC home to f/u with PCP. Toradol Rx for pain.  Final diagnoses:  Arm pain, diffuse, right  Arm pain, diffuse, left        Comer Locket, PA-C 11/20/14 [place], MD 11/20/14 1812

## 2014-11-18 NOTE — ED Provider Notes (Signed)
8:08 PM Patient signed out to me by Jaquita Folds, PA-C. Patient pending xrays. Vitals stable and patient afebrile.   8:31 PM Patient's xrays unremarkable for acute changes. Patient will be discharged with toradol for pain. Vitals stable and patient afebrile.   Results for orders placed or performed during the hospital encounter of 01/10/14  Comprehensive metabolic panel  Result Value Ref Range   Sodium 137 137 - 147 mEq/L   Potassium 3.8 3.7 - 5.3 mEq/L   Chloride 100 96 - 112 mEq/L   CO2 23 19 - 32 mEq/L   Glucose, Bld 89 70 - 99 mg/dL   BUN 6 6 - 23 mg/dL   Creatinine, Ser 0.69 0.50 - 1.10 mg/dL   Calcium 8.7 8.4 - 10.5 mg/dL   Total Protein 7.2 6.0 - 8.3 g/dL   Albumin 3.7 3.5 - 5.2 g/dL   AST 19 0 - 37 U/L   ALT 15 0 - 35 U/L   Alkaline Phosphatase 110 39 - 117 U/L   Total Bilirubin 0.7 0.3 - 1.2 mg/dL   GFR calc non Af Amer >90 >90 mL/min   GFR calc Af Amer >90 >90 mL/min   Anion gap 14 5 - 15  CBC with Differential  Result Value Ref Range   WBC 8.9 4.0 - 10.5 K/uL   RBC 5.88 (H) 3.87 - 5.11 MIL/uL   Hemoglobin 10.9 (L) 12.0 - 15.0 g/dL   HCT 35.6 (L) 36.0 - 46.0 %   MCV 60.5 (L) 78.0 - 100.0 fL   MCH 18.5 (L) 26.0 - 34.0 pg   MCHC 30.6 30.0 - 36.0 g/dL   RDW 15.5 11.5 - 15.5 %   Platelets 330 150 - 400 K/uL   Neutrophils Relative % 63 43 - 77 %   Lymphocytes Relative 27 12 - 46 %   Monocytes Relative 7 3 - 12 %   Eosinophils Relative 2 0 - 5 %   Basophils Relative 1 0 - 1 %   Neutro Abs 5.6 1.7 - 7.7 K/uL   Lymphs Abs 2.4 0.7 - 4.0 K/uL   Monocytes Absolute 0.6 0.1 - 1.0 K/uL   Eosinophils Absolute 0.2 0.0 - 0.7 K/uL   Basophils Absolute 0.1 0.0 - 0.1 K/uL   RBC Morphology POLYCHROMASIA PRESENT    Smear Review LARGE PLATELETS PRESENT    Dg Humerus Left  11/18/2014   CLINICAL DATA:  Bilateral upper arm pain for several weeks. No known injury. Initial encounter.  EXAM: LEFT HUMERUS - 2+ VIEW  COMPARISON:  None.  FINDINGS: There is no evidence of fracture or other  focal bone lesions. Soft tissues are unremarkable.  IMPRESSION: Negative exam.   Electronically Signed   By: Inge Rise M.D.   On: 11/18/2014 20:25   Dg Humerus Right  11/18/2014   CLINICAL DATA:  Midshaft upper arm pain bilaterally for several weeks. No known injury. Initial encounter.  EXAM: RIGHT HUMERUS - 2+ VIEW  COMPARISON:  None.  FINDINGS: There is no evidence of fracture or other focal bone lesions. Soft tissues are unremarkable.  IMPRESSION: Negative exam.   Electronically Signed   By: Inge Rise M.D.   On: 11/18/2014 20:23      Alvina Chou, PA-C 11/18/14 2032  Daleen Bo, MD 11/18/14 [phone]

## 2015-03-15 ENCOUNTER — Encounter (HOSPITAL_BASED_OUTPATIENT_CLINIC_OR_DEPARTMENT_OTHER): Payer: Self-pay | Admitting: Emergency Medicine

## 2015-03-15 ENCOUNTER — Emergency Department (HOSPITAL_BASED_OUTPATIENT_CLINIC_OR_DEPARTMENT_OTHER)
Admission: EM | Admit: 2015-03-15 | Discharge: 2015-03-16 | Disposition: A | Discharge status: Home / Self Care | Payer: Medicaid Other | Attending: Emergency Medicine | Admitting: Emergency Medicine

## 2015-03-15 ENCOUNTER — Emergency Department (HOSPITAL_BASED_OUTPATIENT_CLINIC_OR_DEPARTMENT_OTHER): Payer: Medicaid Other

## 2015-03-15 DIAGNOSIS — K219 Gastro-esophageal reflux disease without esophagitis: Secondary | ICD-10-CM | POA: Insufficient documentation

## 2015-03-15 DIAGNOSIS — G8929 Other chronic pain: Secondary | ICD-10-CM | POA: Insufficient documentation

## 2015-03-15 DIAGNOSIS — M1990 Unspecified osteoarthritis, unspecified site: Secondary | ICD-10-CM | POA: Insufficient documentation

## 2015-03-15 DIAGNOSIS — R609 Edema, unspecified: Secondary | ICD-10-CM

## 2015-03-15 DIAGNOSIS — J45901 Unspecified asthma with (acute) exacerbation: Secondary | ICD-10-CM | POA: Insufficient documentation

## 2015-03-15 DIAGNOSIS — F319 Bipolar disorder, unspecified: Secondary | ICD-10-CM | POA: Insufficient documentation

## 2015-03-15 DIAGNOSIS — I10 Essential (primary) hypertension: Secondary | ICD-10-CM | POA: Insufficient documentation

## 2015-03-15 DIAGNOSIS — F419 Anxiety disorder, unspecified: Secondary | ICD-10-CM | POA: Insufficient documentation

## 2015-03-15 DIAGNOSIS — Z79899 Other long term (current) drug therapy: Secondary | ICD-10-CM | POA: Insufficient documentation

## 2015-03-15 DIAGNOSIS — Z862 Personal history of diseases of the blood and blood-forming organs and certain disorders involving the immune mechanism: Secondary | ICD-10-CM | POA: Insufficient documentation

## 2015-03-15 DIAGNOSIS — R600 Localized edema: Secondary | ICD-10-CM | POA: Insufficient documentation

## 2015-03-15 DIAGNOSIS — Z760 Encounter for issue of repeat prescription: Secondary | ICD-10-CM | POA: Insufficient documentation

## 2015-03-15 DIAGNOSIS — F17210 Nicotine dependence, cigarettes, uncomplicated: Secondary | ICD-10-CM | POA: Insufficient documentation

## 2015-03-15 NOTE — ED Notes (Addendum)
Patient has had increased bilateral leg swelling and SOB when lying down. Family history of CHF. The patient also reports that she is out of her albuterol inhaler. Patient has noted 3 -4 plus pitting edema to her bilateral lower extremities. The patient reports that she is out of her paxil as well, and that is causing her to have some anxiety. Patient reports that she normally does not smoke but today she did because of her nerves

## 2015-03-16 LAB — BASIC METABOLIC PANEL
Anion gap: 6 (ref 5–15)
BUN: 13 mg/dL (ref 6–20)
CALCIUM: 8.1 mg/dL — AB (ref 8.9–10.3)
CO2: 28 mmol/L (ref 22–32)
CREATININE: 0.75 mg/dL (ref 0.44–1.00)
Chloride: 104 mmol/L (ref 101–111)
GFR calc Af Amer: >60 mL/min (ref >60)
GFR calc non Af Amer: >60 mL/min (ref >60)
GLUCOSE: 99 mg/dL (ref 65–99)
Potassium: 3.7 mmol/L (ref 3.5–5.1)
Sodium: 138 mmol/L (ref 135–145)

## 2015-03-16 LAB — CBC WITH DIFFERENTIAL/PLATELET
BASOS ABS: 0.1 10*3/uL (ref 0.0–0.1)
Basophils Relative: 1 %
EOS ABS: 0.4 10*3/uL (ref 0.0–0.7)
Eosinophils Relative: 5 %
HCT: 34.3 % — ABNORMAL LOW (ref 36.0–46.0)
Hemoglobin: 10.8 g/dL — ABNORMAL LOW (ref 12.0–15.0)
LYMPHS ABS: 2.7 10*3/uL (ref 0.7–4.0)
LYMPHS PCT: 32 %
MCH: 19.7 pg — AB (ref 26.0–34.0)
MCHC: 31.5 g/dL (ref 30.0–36.0)
MCV: 62.5 fL — ABNORMAL LOW (ref 78.0–100.0)
Monocytes Absolute: 0.6 10*3/uL (ref 0.1–1.0)
Monocytes Relative: 7 %
NEUTROS ABS: 4.5 10*3/uL (ref 1.7–7.7)
Neutrophils Relative %: 55 %
PLATELETS: 270 10*3/uL (ref 150–400)
RBC: 5.49 MIL/uL — AB (ref 3.87–5.11)
RDW: 17.4 % — ABNORMAL HIGH (ref 11.5–15.5)
WBC: 8.3 10*3/uL (ref 4.0–10.5)

## 2015-03-16 LAB — BRAIN NATRIURETIC PEPTIDE: B NATRIURETIC PEPTIDE 5: 29.1 pg/mL (ref 0.0–100.0)

## 2015-03-16 LAB — TROPONIN I: Troponin I: <0.03 ng/mL (ref <0.031)

## 2015-03-16 MED ORDER — HYDROCHLOROTHIAZIDE 50 MG PO TABS: 50.0000 mg | ORAL_TABLET | Freq: Every day | ORAL | Status: DC

## 2015-03-16 MED ORDER — FENTANYL CITRATE (PF) 100 MCG/2ML IJ SOLN
100.0000 ug | Freq: Once | INTRAMUSCULAR | Status: AC
  Administered 2015-03-16: 100 ug via INTRAVENOUS
  Filled 2015-03-16: qty 2

## 2015-03-16 MED ORDER — HYDROCHLOROTHIAZIDE 25 MG PO TABS
50.0000 mg | ORAL_TABLET | Freq: Once | ORAL | Status: AC
  Administered 2015-03-16: 50 mg via ORAL
  Filled 2015-03-16: qty 2

## 2015-03-16 MED ORDER — LISINOPRIL 2.5 MG PO TABS
2.5000 mg | ORAL_TABLET | Freq: Once | ORAL | Status: DC
  Filled 2015-03-16: qty 1

## 2015-03-16 MED ORDER — PAROXETINE HCL 20 MG PO TABS: 20.0000 mg | ORAL_TABLET | Freq: Every day | ORAL | Status: DC

## 2015-03-16 MED ORDER — ALBUTEROL SULFATE HFA 108 (90 BASE) MCG/ACT IN AERS: 2.0000 | INHALATION_SPRAY | RESPIRATORY_TRACT | Status: DC | PRN

## 2015-03-16 MED ORDER — HYDROCODONE-ACETAMINOPHEN 5-325 MG PO TABS: 1.0000 | ORAL_TABLET | Freq: Four times a day (QID) | ORAL | Status: DC | PRN

## 2015-03-16 MED ORDER — LISINOPRIL 2.5 MG PO TABS: 2.5000 mg | ORAL_TABLET | Freq: Every day | ORAL | Status: DC

## 2015-03-16 MED ORDER — CARVEDILOL 25 MG PO TABS: 25.0000 mg | ORAL_TABLET | Freq: Two times a day (BID) | ORAL | Status: DC

## 2015-03-16 MED ORDER — CLONIDINE HCL 0.3 MG PO TABS: 0.3000 mg | ORAL_TABLET | Freq: Three times a day (TID) | ORAL | Status: DC

## 2015-03-16 MED ORDER — HYDRALAZINE HCL 50 MG PO TABS: ORAL_TABLET | ORAL | Status: DC

## 2015-03-16 NOTE — ED Notes (Signed)
MD at bedside. 

## 2015-03-16 NOTE — ED Provider Notes (Addendum)
CSN: [id number]     Arrival date & time 03/15/15  2153 History   First MD Initiated Contact with Patient 03/16/15 0035     Chief Complaint  Patient presents with  . Shortness of Breath     (Consider location/radiation/quality/duration/timing/severity/associated sxs/prior Treatment) HPI  This is a 45 year old female with a history of hypertension and asthma. She has been out of her medications since October of last year with the exception of clonidine which she takes twice daily. She is here with a one-week history of shortness of breath, worse with exertion or lying supine. Symptoms are moderate. She is also having edema of her lower legs and feet with an aching pain and hyperesthesia of her ankles and feet. She denies chest pain, nausea or vomiting. She is out of Paxil and she is having anxiety.  Past Medical History  Diagnosis Date  . Arthritis   . Hypertension   . Chronic back pain   . Anxiety   . GERD (gastroesophageal reflux disease)   . Shortness of breath     on exertion due to weight  . Blood dyscrasia     sickle cell trait  . Anemia   . Bipolar 1 disorder [facility])    Past Surgical History  Procedure Laterality Date  . Cholecystectomy    . Cesarean section    . Tubal ligation    . Hysteroscopy N/A 08/23/2013    Procedure: HYSTEROSCOPY D&C WITH HYDROTHERMAL ABLATION;  Surgeon: Osborne Oman, MD;  Location: [facility];  Service: Gynecology;  Laterality: N/A;   Family History  Problem Relation Age of Onset  . CAD Father   . Heart failure Brother   . Hypertension Brother   . CAD Paternal Aunt    Social History  Substance Use Topics  . Smoking status: Current Every Day Smoker -- 0.50 packs/day for 14 years    Types: Cigarettes  . Smokeless tobacco: None  . Alcohol Use: No   OB History    Gravida Para Term Preterm AB TAB SAB Ectopic Multiple Living   4 4 4       2      Review of Systems  All other systems reviewed and are negative.   Allergies  Review of  patient's allergies indicates no known allergies.  Home Medications   Prior to Admission medications   Medication Sig Start Date End Date Taking? Authorizing Provider  albuterol (PROVENTIL HFA;VENTOLIN HFA) 108 (90 BASE) MCG/ACT inhaler Inhale 1-2 puffs into the lungs every 6 (six) hours as needed for wheezing or shortness of breath. 12/15/13   Ezequiel Essex, MD  carvedilol (COREG) 25 MG tablet Take 1 tablet (25 mg total) by mouth 2 (two) times daily with a meal. 04/27/14   Thayer Headings, MD  cloNIDine (CATAPRES) 0.3 MG tablet Take 1 tablet (0.3 mg total) by mouth 3 (three) times daily. 04/27/14   Thayer Headings, MD  cyclobenzaprine (FLEXERIL) 10 MG tablet Take 1 tablet (10 mg total) by mouth 2 (two) times daily as needed for muscle spasms. 10/04/14   Charlann Lange, PA-C  hydrALAZINE (APRESOLINE) 25 MG tablet TAKE THREE TABLETS BY MOUTH EVERY 8 HOURS 04/27/14   Thayer Headings, MD  hydrochlorothiazide (HYDRODIURIL) 50 MG tablet Take 1 tablet (50 mg total) by mouth daily. 04/27/14   Thayer Headings, MD  HYDROcodone-acetaminophen (NORCO/VICODIN) 5-325 MG per tablet Take 1-2 tablets by mouth every 4 (four) hours as needed. 10/04/14   Charlann Lange, PA-C  ibuprofen (ADVIL,MOTRIN) 800 MG  tablet Take 1 tablet (800 mg total) by mouth 3 (three) times daily. 10/04/14   Charlann Lange, PA-C  ketorolac (TORADOL) 10 MG tablet Take 1 tablet (10 mg total) by mouth every 6 (six) hours as needed. 11/18/14   Comer Locket, PA-C  lisinopril (PRINIVIL,ZESTRIL) 2.5 MG tablet Take 1 tablet (2.5 mg total) by mouth daily. 04/27/14   Thayer Headings, MD  omeprazole (PRILOSEC) 20 MG capsule Take 20 mg by mouth daily.    Historical Provider, MD  oxyCODONE-acetaminophen (PERCOCET/ROXICET) 5-325 MG per tablet Take 1-2 tablets by mouth every 6 (six) hours as needed for severe pain. Patient not taking: Reported on 04/27/2014 03/16/14   Virgel Manifold, MD  PARoxetine (PAXIL) 20 MG tablet Take 20 mg by mouth daily.    Historical Provider,  MD  phentermine 37.5 MG capsule Take 37.5 mg by mouth every morning.    Historical Provider, MD  traZODone (DESYREL) 100 MG tablet Take 1 tablet (100 mg total) by mouth at bedtime. 06/07/13   Belkys A Regalado, MD   BP 179/90 mmHg  Pulse 93  Temp(Src) 98.5 F (36.9 C) (Oral)  Resp 23  Ht 5\' 7"  (1.702 m)  Wt 320 lb (145.151 kg)  BMI 50.11 kg/m2  SpO2 97%   Physical Exam  General: Well-developed, obese female in no acute distress; appearance consistent with age of record HENT: normocephalic; atraumatic Eyes: pupils equal, round and reactive to light; extraocular muscles intact Neck: supple Heart: regular rate and rhythm Lungs: Mildly decreased air movement bilaterally Abdomen: soft; obese; nontender; bowel sounds present Extremities: No deformity; full range of motion; +1 pitting edema of lower legs and feet without erythema or warmth Neurologic: Awake, alert and oriented; motor function intact in all extremities and symmetric; hyperesthesia of ankles and feet; no facial droop Skin: Warm and dry Psychiatric: Normal mood and affect    ED Course  Procedures (including critical care time)   EKG Interpretation   Date/Time:  Friday March 15 2015 22:04:30 EST Ventricular Rate:  100 PR Interval:  164 QRS Duration: 68 QT Interval:  370 QTC Calculation: 477 R Axis:   32 Text Interpretation:  Normal sinus rhythm Normal ECG since last tracing no  significant change Confirmed by MILLER  MD, BRIAN ([id number]) on 03/15/2015  10:09:47 PM      MDM  Nursing notes and vitals signs, including pulse oximetry, reviewed.  Summary of this visit's results, reviewed by myself:  Labs:  Results for orders placed or performed during the hospital encounter of 03/15/15 (from the past 24 hour(s))  CBC with Differential/Platelet     Status: Abnormal   Collection Time: 03/16/15 12:55 AM  Result Value Ref Range   WBC 8.3 4.0 - 10.5 K/uL   RBC 5.49 (H) 3.87 - 5.11 MIL/uL   Hemoglobin 10.8 (L) 12.0  - 15.0 g/dL   HCT 34.3 (L) 36.0 - 46.0 %   MCV 62.5 (L) 78.0 - 100.0 fL   MCH 19.7 (L) 26.0 - 34.0 pg   MCHC 31.5 30.0 - 36.0 g/dL   RDW 17.4 (H) 11.5 - 15.5 %   Platelets 270 150 - 400 K/uL   Neutrophils Relative % 55 %   Lymphocytes Relative 32 %   Monocytes Relative 7 %   Eosinophils Relative 5 %   Basophils Relative 1 %   Neutro Abs 4.5 1.7 - 7.7 K/uL   Lymphs Abs 2.7 0.7 - 4.0 K/uL   Monocytes Absolute 0.6 0.1 - 1.0 K/uL   Eosinophils Absolute  0.4 0.0 - 0.7 K/uL   Basophils Absolute 0.1 0.0 - 0.1 K/uL   RBC Morphology POLYCHROMASIA PRESENT    Smear Review LARGE PLATELETS PRESENT   Brain natriuretic peptide     Status: None   Collection Time: 03/16/15 12:55 AM  Result Value Ref Range   B Natriuretic Peptide 29.1 0.0 - 100.0 pg/mL  Troponin I     Status: None   Collection Time: 03/16/15 12:55 AM  Result Value Ref Range   Troponin I <0.03 <0.031 ng/mL  Basic metabolic panel     Status: Abnormal   Collection Time: 03/16/15 12:55 AM  Result Value Ref Range   Sodium 138 135 - 145 mmol/L   Potassium 3.7 3.5 - 5.1 mmol/L   Chloride 104 101 - 111 mmol/L   CO2 28 22 - 32 mmol/L   Glucose, Bld 99 65 - 99 mg/dL   BUN 13 6 - 20 mg/dL   Creatinine, Ser 0.75 0.44 - 1.00 mg/dL   Calcium 8.1 (L) 8.9 - 10.3 mg/dL   GFR calc non Af Amer >60 >60 mL/min   GFR calc Af Amer >60 >60 mL/min   Anion gap 6 5 - 15    Imaging Studies: Dg Chest 2 View  03/16/2015  CLINICAL DATA:  Acute onset of shortness of breath and worsening bilateral leg swelling. Initial encounter. EXAM: CHEST  2 VIEW COMPARISON:  Chest radiograph performed 12/15/2013 FINDINGS: The lungs are well-aerated. Vascular congestion is noted. Mild bibasilar opacities may reflect atelectasis or possibly mild interstitial edema. There is no evidence of pleural effusion or pneumothorax. The heart is normal in size; the mediastinal contour is within normal limits. No acute osseous abnormalities are seen. Clips are noted within the right  upper quadrant, reflecting prior cholecystectomy. IMPRESSION: Vascular congestion noted. Mild bibasilar opacities may reflect atelectasis or possibly mild interstitial edema. Electronically Signed   By: Garald Balding M.D.   On: 03/16/2015 00:44   1:51 AM We will refill the patient's antihypertensives and treated with a short course of an analgesic. She will be seeking reinstatement of her Medicaid so that she can reestablish with her PCP.    Shanon Rosser, MD 03/16/15 0151  Shanon Rosser, MD 03/16/15 [id number]

## 2015-05-20 ENCOUNTER — Other Ambulatory Visit: Payer: Self-pay | Admitting: Cardiovascular Disease

## 2015-05-21 ENCOUNTER — Other Ambulatory Visit: Payer: Self-pay | Admitting: *Deleted

## 2015-06-17 ENCOUNTER — Other Ambulatory Visit: Payer: Self-pay | Admitting: Cardiovascular Disease

## 2015-08-16 ENCOUNTER — Other Ambulatory Visit: Payer: Self-pay | Admitting: *Deleted

## 2015-08-16 DIAGNOSIS — I10 Essential (primary) hypertension: Secondary | ICD-10-CM

## 2015-08-16 MED ORDER — CLONIDINE HCL 0.3 MG PO TABS: 0.3000 mg | ORAL_TABLET | Freq: Three times a day (TID) | ORAL | Status: DC

## 2015-08-24 ENCOUNTER — Emergency Department (HOSPITAL_COMMUNITY)
Admission: EM | Admit: 2015-08-24 | Discharge: 2015-08-24 | Disposition: A | Discharge status: Home / Self Care | Payer: Medicaid Other | Attending: Emergency Medicine | Admitting: Emergency Medicine

## 2015-08-24 ENCOUNTER — Encounter (HOSPITAL_COMMUNITY): Payer: Self-pay | Admitting: Emergency Medicine

## 2015-08-24 DIAGNOSIS — K029 Dental caries, unspecified: Secondary | ICD-10-CM | POA: Insufficient documentation

## 2015-08-24 DIAGNOSIS — Z9114 Patient's other noncompliance with medication regimen: Secondary | ICD-10-CM

## 2015-08-24 DIAGNOSIS — Z79899 Other long term (current) drug therapy: Secondary | ICD-10-CM | POA: Insufficient documentation

## 2015-08-24 DIAGNOSIS — Z9119 Patient's noncompliance with other medical treatment and regimen: Secondary | ICD-10-CM | POA: Insufficient documentation

## 2015-08-24 DIAGNOSIS — F17210 Nicotine dependence, cigarettes, uncomplicated: Secondary | ICD-10-CM | POA: Insufficient documentation

## 2015-08-24 DIAGNOSIS — I10 Essential (primary) hypertension: Secondary | ICD-10-CM

## 2015-08-24 HISTORY — DX: Obesity, unspecified: E66.9

## 2015-08-24 MED ORDER — OXYCODONE-ACETAMINOPHEN 5-325 MG PO TABS
1.0000 | ORAL_TABLET | Freq: Once | ORAL | Status: AC
  Administered 2015-08-24: 1 via ORAL
  Filled 2015-08-24: qty 1

## 2015-08-24 MED ORDER — CLONIDINE HCL 0.2 MG PO TABS
0.3000 mg | ORAL_TABLET | Freq: Once | ORAL | Status: AC
  Administered 2015-08-24: 0.3 mg via ORAL
  Filled 2015-08-24: qty 1

## 2015-08-24 MED ORDER — CLONIDINE HCL 0.3 MG PO TABS: 0.3000 mg | ORAL_TABLET | Freq: Three times a day (TID) | ORAL | Status: DC

## 2015-08-24 MED ORDER — AMOXICILLIN 500 MG PO CAPS: 500.0000 mg | ORAL_CAPSULE | Freq: Three times a day (TID) | ORAL | Status: DC

## 2015-08-24 MED ORDER — AMOXICILLIN 500 MG PO CAPS
500.0000 mg | ORAL_CAPSULE | Freq: Once | ORAL | Status: AC
  Administered 2015-08-24: 500 mg via ORAL
  Filled 2015-08-24: qty 1

## 2015-08-24 MED ORDER — HYDROCODONE-ACETAMINOPHEN 5-325 MG PO TABS: 1.0000 | ORAL_TABLET | ORAL | Status: DC | PRN

## 2015-08-24 NOTE — ED Provider Notes (Signed)
CSN: [id number]     Arrival date & time 08/24/15  1947 History   First MD Initiated Contact with Patient 08/24/15 2037     Chief Complaint  Patient presents with  . Dental Pain  . Hypertension   PT IS A 45 YO BF WITH A HX OF HTN WHO PRESENTS TO THE ED DUE TO DENTAL PAIN.  THE PT SAID THAT HER LOWER LEFT MOLAR FILLING FELL OUT AND IT IS VERY PAINFUL.  PT ALSO RAN OUT OF HER CLONIDINE.  PT DENIES C/P OR SOB.  (Consider location/radiation/quality/duration/timing/severity/associated sxs/prior Treatment) Patient is a 45 y.o. female presenting with tooth pain and hypertension. The history is provided by the patient.  Dental Pain Location:  Lower Lower teeth location:  18/LL 2nd molar Quality:  Dull Severity:  Moderate Onset quality:  Sudden Timing:  Constant Progression:  Unchanged Chronicity:  New Context: filling fell out   Relieved by:  Nothing Worsened by:  Nothing tried Ineffective treatments:  None tried Hypertension    Past Medical History  Diagnosis Date  . Arthritis   . Hypertension   . Chronic back pain   . Anxiety   . GERD (gastroesophageal reflux disease)   . Shortness of breath     on exertion due to weight  . Blood dyscrasia     sickle cell trait  . Anemia   . Bipolar 1 disorder ([place])   . Obesity    Past Surgical History  Procedure Laterality Date  . Cholecystectomy    . Cesarean section    . Tubal ligation    . Hysteroscopy N/A 08/23/2013    Procedure: HYSTEROSCOPY D&C WITH HYDROTHERMAL ABLATION;  Surgeon: Osborne Oman, MD;  Location: [facility];  Service: Gynecology;  Laterality: N/A;   Family History  Problem Relation Age of Onset  . CAD Father   . Heart failure Brother   . Hypertension Brother   . CAD Paternal Aunt    Social History  Substance Use Topics  . Smoking status: Current Every Day Smoker -- 0.00 packs/day for 14 years    Types: Cigarettes  . Smokeless tobacco: None  . Alcohol Use: No   OB History    Gravida Para Term Preterm AB TAB  SAB Ectopic Multiple Living   4 4 4       2      Review of Systems  HENT: Positive for dental problem.   All other systems reviewed and are negative.     Allergies  Review of patient's allergies indicates no known allergies.  Home Medications   Prior to Admission medications   Medication Sig Start Date End Date Taking? Authorizing Provider  cloNIDine (CATAPRES) 0.3 MG tablet Take 1 tablet (0.3 mg total) by mouth 3 (three) times daily. **Patient is overdue for an appt.Please call and schedule for further refills** 08/16/15  Yes Thayer Headings, MD  FLUoxetine (PROZAC) 10 MG capsule Take 10 mg by mouth daily.   Yes Historical Provider, MD  ibuprofen (ADVIL,MOTRIN) 200 MG tablet Take 400 mg by mouth every 6 (six) hours as needed for moderate pain.   Yes Historical Provider, MD  albuterol (PROVENTIL HFA;VENTOLIN HFA) 108 (90 Base) MCG/ACT inhaler Inhale 2 puffs into the lungs every 4 (four) hours as needed for wheezing or shortness of breath. 03/16/15   Shanon Rosser, MD  carvedilol (COREG) 25 MG tablet Take 1 tablet (25 mg total) by mouth 2 (two) times daily with a meal. 03/16/15   Shanon Rosser, MD  cyclobenzaprine (  FLEXERIL) 10 MG tablet Take 1 tablet (10 mg total) by mouth 2 (two) times daily as needed for muscle spasms. 10/04/14   Charlann Lange, PA-C  hydrALAZINE (APRESOLINE) 50 MG tablet TAKE THREE TABLETS BY MOUTH EVERY 8 HOURS 03/16/15   John Molpus, MD  hydrochlorothiazide (HYDRODIURIL) 50 MG tablet Take 1 tablet (50 mg total) by mouth daily. 03/16/15   John Molpus, MD  HYDROcodone-acetaminophen (NORCO) 5-325 MG tablet Take 1-2 tablets by mouth every 6 (six) hours as needed (for pain). 03/16/15   John Molpus, MD  lisinopril (PRINIVIL,ZESTRIL) 2.5 MG tablet Take 1 tablet (2.5 mg total) by mouth daily. 03/16/15   John Molpus, MD  omeprazole (PRILOSEC) 20 MG capsule Take 20 mg by mouth daily.    Historical Provider, MD  PARoxetine (PAXIL) 20 MG tablet Take 1 tablet (20 mg total) by mouth daily.  03/16/15   John Molpus, MD  phentermine 37.5 MG capsule Take 37.5 mg by mouth every morning.    Historical Provider, MD  traZODone (DESYREL) 100 MG tablet Take 1 tablet (100 mg total) by mouth at bedtime. 06/07/13   Belkys A Regalado, MD   BP 195/106 mmHg  Pulse 72  Temp(Src) 98.8 F (37.1 C) (Oral)  Resp 21  Wt 320 lb 3 oz (145.236 kg)  SpO2 100% Physical Exam  Constitutional: She is oriented to person, place, and time. She appears well-developed and well-nourished.  HENT:  Head: Normocephalic and atraumatic.  Right Ear: External ear normal.  Left Ear: External ear normal.  Nose: Nose normal.  Mouth/Throat:    Eyes: Conjunctivae and EOM are normal. Pupils are equal, round, and reactive to light.  Neck: Normal range of motion. Neck supple.  Cardiovascular: Normal rate, regular rhythm, normal heart sounds and intact distal pulses.   Pulmonary/Chest: Effort normal and breath sounds normal.  Abdominal: Soft. Bowel sounds are normal.  Neurological: She is alert and oriented to person, place, and time.  Skin: Skin is warm and dry.  Psychiatric: She has a normal mood and affect. Her behavior is normal. Thought content normal.  Nursing note and vitals reviewed.   ED Course  Procedures (including critical care time) Labs Review Labs Reviewed - No data to display  Imaging Review No results found. I have personally reviewed and evaluated these images and lab results as part of my medical decision-making.   EKG Interpretation   Date/Time:  Saturday August 24 2015 20:58:14 EDT Ventricular Rate:  78 PR Interval:    QRS Duration: 82 QT Interval:  426 QTC Calculation: 486 R Axis:   61 Text Interpretation:  Sinus rhythm Minimal ST elevation, anterior leads  Borderline prolonged QT interval No significant change since last tracing  Confirmed by [facility] MD, Lexianna Weinrich ([id number]) on 08/24/2015 9:02:19 PM      MDM  PT IS FEELING BETTER.  HER BP REMAINS HIGH, BUT THIS IS A CHRONIC PROBLEM FOR  PT.  SHE IS GIVEN A RX FOR CLONIDINE (THE ONLY MED SHE TAKES FOR BP) AND IS ALSO GIVEN A RX FOR PAIN MEDS/AMOX FOR HER TOOTH.  SHE IS ENCOURAGED TO F/U WITH PCP AND RETURN IF WORSE. Final diagnoses:  Essential hypertension  Dental caries      Isla Pence, MD 08/24/15 2150

## 2015-08-24 NOTE — ED Notes (Addendum)
Pt. reports left lower molar onset today " filling fell off" , pt. added she ran out of her antihypertensive medication yesterday . Hypertensive at triage .

## 2015-08-24 NOTE — Discharge Instructions (Signed)
Dental Caries °Dental caries is tooth decay. This decay can cause a hole in teeth (cavity) that can get bigger and deeper over time. °HOME CARE °· Brush and floss your teeth. Do this at least two times a day. °· Use a fluoride toothpaste. °· Use a mouth rinse if told by your dentist or doctor. °· Eat less sugary and starchy foods. Drink less sugary drinks. °· Avoid snacking often on sugary and starchy foods. Avoid sipping often on sugary drinks. °· Keep regular checkups and cleanings with your dentist. °· Use fluoride supplements if told by your dentist or doctor. °· Allow fluoride to be applied to teeth if told by your dentist or doctor. °  °This information is not intended to replace advice given to you by your health care provider. Make sure you discuss any questions you have with your health care provider. °  °Document Released: 11/19/2007 Document Revised: 03/02/2014 Document Reviewed: 02/12/2012 °Elsevier Interactive Patient Education ©2016 [employer] ° °

## 2015-08-24 NOTE — ED Notes (Signed)
Dr. Gilford Raid notified on pt.'s elevated blood pressure . Nurse first notified on pt.'s elevated blood pressure .

## 2015-10-15 ENCOUNTER — Emergency Department (HOSPITAL_COMMUNITY)
Admission: EM | Admit: 2015-10-15 | Discharge: 2015-10-15 | Disposition: A | Discharge status: Home / Self Care | Payer: Medicaid Other | Attending: Emergency Medicine | Admitting: Emergency Medicine

## 2015-10-15 ENCOUNTER — Encounter (HOSPITAL_COMMUNITY): Payer: Self-pay

## 2015-10-15 DIAGNOSIS — I10 Essential (primary) hypertension: Secondary | ICD-10-CM | POA: Insufficient documentation

## 2015-10-15 DIAGNOSIS — F17210 Nicotine dependence, cigarettes, uncomplicated: Secondary | ICD-10-CM | POA: Insufficient documentation

## 2015-10-15 DIAGNOSIS — Z79899 Other long term (current) drug therapy: Secondary | ICD-10-CM | POA: Insufficient documentation

## 2015-10-15 DIAGNOSIS — Z791 Long term (current) use of non-steroidal anti-inflammatories (NSAID): Secondary | ICD-10-CM | POA: Insufficient documentation

## 2015-10-15 DIAGNOSIS — Z9114 Patient's other noncompliance with medication regimen: Secondary | ICD-10-CM

## 2015-10-15 LAB — BASIC METABOLIC PANEL
ANION GAP: 7 (ref 5–15)
BUN: 10 mg/dL (ref 6–20)
CALCIUM: 8.4 mg/dL — AB (ref 8.9–10.3)
CO2: 25 mmol/L (ref 22–32)
Chloride: 106 mmol/L (ref 101–111)
Creatinine, Ser: 0.72 mg/dL (ref 0.44–1.00)
GLUCOSE: 103 mg/dL — AB (ref 65–99)
POTASSIUM: 3.6 mmol/L (ref 3.5–5.1)
Sodium: 138 mmol/L (ref 135–145)

## 2015-10-15 LAB — CBC
HEMATOCRIT: 39 % (ref 36.0–46.0)
Hemoglobin: 12.2 g/dL (ref 12.0–15.0)
MCH: 19.6 pg — AB (ref 26.0–34.0)
MCHC: 31.3 g/dL (ref 30.0–36.0)
MCV: 62.6 fL — AB (ref 78.0–100.0)
Platelets: 294 10*3/uL (ref 150–400)
RBC: 6.23 MIL/uL — ABNORMAL HIGH (ref 3.87–5.11)
RDW: 15.8 % — AB (ref 11.5–15.5)
WBC: 10.3 10*3/uL (ref 4.0–10.5)

## 2015-10-15 MED ORDER — CLONIDINE HCL 0.1 MG PO TABS
0.3000 mg | ORAL_TABLET | Freq: Once | ORAL | Status: AC
  Administered 2015-10-15: 0.3 mg via ORAL
  Filled 2015-10-15: qty 3

## 2015-10-15 MED ORDER — HYDRALAZINE HCL 50 MG PO TABS
75.0000 mg | ORAL_TABLET | Freq: Once | ORAL | Status: AC
  Administered 2015-10-15: 75 mg via ORAL
  Filled 2015-10-15: qty 1

## 2015-10-15 MED ORDER — LISINOPRIL 2.5 MG PO TABS: 2.5000 mg | ORAL_TABLET | Freq: Every day | ORAL | 0 refills | Status: DC

## 2015-10-15 MED ORDER — CLONIDINE HCL 0.3 MG PO TABS: 0.3000 mg | ORAL_TABLET | Freq: Three times a day (TID) | ORAL | 0 refills | Status: DC

## 2015-10-15 MED ORDER — CARVEDILOL 25 MG PO TABS: 25.0000 mg | ORAL_TABLET | Freq: Two times a day (BID) | ORAL | 0 refills | Status: DC

## 2015-10-15 MED ORDER — ONDANSETRON HCL 4 MG/2ML IJ SOLN
4.0000 mg | Freq: Once | INTRAMUSCULAR | Status: AC
  Administered 2015-10-15: 4 mg via INTRAVENOUS
  Filled 2015-10-15: qty 2

## 2015-10-15 MED ORDER — HYDRALAZINE HCL 50 MG PO TABS: 50.0000 mg | ORAL_TABLET | Freq: Three times a day (TID) | ORAL | 0 refills | Status: DC

## 2015-10-15 MED ORDER — HYDROCHLOROTHIAZIDE 50 MG PO TABS: 50.0000 mg | ORAL_TABLET | Freq: Every day | ORAL | 0 refills | Status: DC

## 2015-10-15 MED ORDER — HYDROCHLOROTHIAZIDE 50 MG PO TABS
50.0000 mg | ORAL_TABLET | Freq: Once | ORAL | Status: AC
  Administered 2015-10-15: 50 mg via ORAL
  Filled 2015-10-15: qty 1

## 2015-10-15 MED ORDER — MORPHINE SULFATE (PF) 4 MG/ML IV SOLN
4.0000 mg | Freq: Once | INTRAVENOUS | Status: AC
  Administered 2015-10-15: 4 mg via INTRAVENOUS
  Filled 2015-10-15: qty 1

## 2015-10-15 NOTE — ED Provider Notes (Addendum)
[facility] DEPT Provider Note   CSN: [id number] Arrival date & time: 10/15/15  6759     History   Chief Complaint Chief Complaint  Patient presents with  . Headache    medication refill     HPI Kelly Novak is a 45 y.o. female.  Patient w hx htn, non compliant w all of her bp meds x several months except clonidine, and out of clonidine for the past 2 days, c/o gradual onset dull frontal headache in past day. Pain mild-mod, frontal, non radiating, constant, without specific exacerbating or alleviating factors. No eye pain or change in vision. No change in speech. No numbness/weakness or change in normal functional ability. No problems w balance or coordination. No falls. No neck pain or stiffness. No severe or acute headaches. Denies chest pain. No sob. No leg swelling.  States lost her medicaid and is working on getting orange card.      The history is provided by the patient.  Headache   Pertinent negatives include no fever, no shortness of breath and no vomiting.    Past Medical History:  Diagnosis Date  . Anemia   . Anxiety   . Arthritis   . Bipolar 1 disorder ([place])   . Blood dyscrasia    sickle cell trait  . Chronic back pain   . GERD (gastroesophageal reflux disease)   . Hypertension   . Obesity   . Shortness of breath    on exertion due to weight    Patient Active Problem List   Diagnosis Date Noted  . Abnormal uterine bleeding 08/10/2013  . Uterine fibroids 08/10/2013  . ASCUS pap with negative HRHPV 08/10/2013  . Hypertensive emergency 06/06/2013  . Chest pain 06/06/2013  . Hypertensive urgency 06/06/2013  . Pes planus (flat feet) 09/02/2012  . Anemia 09/02/2012  . Foot pain 09/02/2012  . Smoker 09/02/2012    Past Surgical History:  Procedure Laterality Date  . CESAREAN SECTION    . CHOLECYSTECTOMY    . HYSTEROSCOPY N/A 08/23/2013   Procedure: HYSTEROSCOPY D&C WITH HYDROTHERMAL ABLATION;  Surgeon: Osborne Oman, MD;  Location: [facility];   Service: Gynecology;  Laterality: N/A;  . TUBAL LIGATION      OB History    Gravida Para Term Preterm AB Living   4 4 4     2    SAB TAB Ectopic Multiple Live Births           4       Home Medications    Prior to Admission medications   Medication Sig Start Date End Date Taking? Authorizing Provider  cloNIDine (CATAPRES) 0.3 MG tablet Take 1 tablet (0.3 mg total) by mouth 3 (three) times daily. **Patient is overdue for an appt.Please call and schedule for further refills** 08/24/15  Yes Isla Pence, MD  ibuprofen (ADVIL,MOTRIN) 200 MG tablet Take 600 mg by mouth every 6 (six) hours as needed for headache or moderate pain.    Yes Historical Provider, MD  omeprazole (PRILOSEC) 20 MG capsule Take 20 mg by mouth 3 (three) times daily before meals. Reported on 08/24/2015   Yes Historical Provider, MD  PARoxetine (PAXIL) 20 MG tablet Take 1 tablet (20 mg total) by mouth daily. 03/16/15  Yes John Molpus, MD  albuterol (PROVENTIL HFA;VENTOLIN HFA) 108 (90 Base) MCG/ACT inhaler Inhale 2 puffs into the lungs every 4 (four) hours as needed for wheezing or shortness of breath. Patient not taking: Reported on 10/15/2015 03/16/15   Shanon Rosser, MD  amoxicillin (AMOXIL) 500 MG capsule Take 1 capsule (500 mg total) by mouth 3 (three) times daily. Patient not taking: Reported on 10/15/2015 08/24/15   Isla Pence, MD  carvedilol (COREG) 25 MG tablet Take 1 tablet (25 mg total) by mouth 2 (two) times daily with a meal. Patient not taking: Reported on 08/24/2015 03/16/15   Shanon Rosser, MD  cyclobenzaprine (FLEXERIL) 10 MG tablet Take 1 tablet (10 mg total) by mouth 2 (two) times daily as needed for muscle spasms. Patient not taking: Reported on 10/15/2015 10/04/14   Charlann Lange, PA-C  hydrALAZINE (APRESOLINE) 50 MG tablet TAKE THREE TABLETS BY MOUTH EVERY 8 HOURS Patient not taking: Reported on 08/24/2015 03/16/15   Shanon Rosser, MD  hydrochlorothiazide (HYDRODIURIL) 50 MG tablet Take 1 tablet (50 mg total) by mouth  daily. Patient not taking: Reported on 08/24/2015 03/16/15   Shanon Rosser, MD  HYDROcodone-acetaminophen (NORCO/VICODIN) 5-325 MG tablet Take 1 tablet by mouth every 4 (four) hours as needed. Patient not taking: Reported on 10/15/2015 08/24/15   Isla Pence, MD  lisinopril (PRINIVIL,ZESTRIL) 2.5 MG tablet Take 1 tablet (2.5 mg total) by mouth daily. Patient not taking: Reported on 08/24/2015 03/16/15   Shanon Rosser, MD  traZODone (DESYREL) 100 MG tablet Take 1 tablet (100 mg total) by mouth at bedtime. Patient not taking: Reported on 10/15/2015 06/07/13   Elmarie Shiley, MD    Family History Family History  Problem Relation Age of Onset  . CAD Father   . Heart failure Brother   . Hypertension Brother   . CAD Paternal Aunt     Social History Social History  Substance Use Topics  . Smoking status: Current Every Day Smoker    Packs/day: 0.00    Years: 14.00    Types: Cigarettes  . Smokeless tobacco: Never Used  . Alcohol use No     Allergies   Review of patient's allergies indicates no known allergies.   Review of Systems Review of Systems  Constitutional: Negative for chills and fever.  HENT: Negative for sore throat.   Eyes: Negative for pain, redness and visual disturbance.  Respiratory: Negative for shortness of breath.   Cardiovascular: Negative for chest pain.  Gastrointestinal: Negative for abdominal pain and vomiting.  Genitourinary: Negative for flank pain.  Musculoskeletal: Negative for back pain and neck pain.  Skin: Negative for rash.  Neurological: Positive for headaches. Negative for syncope, weakness and numbness.  Hematological: Does not bruise/bleed easily.  Psychiatric/Behavioral: Negative for confusion.     Physical Exam Updated Vital Signs BP (!) 191/122   Pulse 88   Temp 98.8 F (37.1 C) (Oral)   Resp 18   Ht 5' 7"  (1.702 m)   Wt (!) 145.2 kg   SpO2 99%   BMI 50.12 kg/m   Physical Exam  Constitutional: She is oriented to person, place, and  time. She appears well-developed and well-nourished. No distress.  HENT:  Head: Atraumatic.  Nose: Nose normal.  Mouth/Throat: Oropharynx is clear and moist.  No sinus or temporal tenderness.  Eyes: Conjunctivae and EOM are normal. Pupils are equal, round, and reactive to light. No scleral icterus.  Neck: Neck supple. No tracheal deviation present. No thyromegaly present.  No stiffness or rigidity.   Cardiovascular: Normal rate, regular rhythm, normal heart sounds and intact distal pulses.  Exam reveals no gallop and no friction rub.   No murmur heard. Pulmonary/Chest: Effort normal and breath sounds normal. No respiratory distress.  Abdominal: Soft. Normal appearance and bowel sounds  are normal. She exhibits no distension. There is no tenderness.  Obese. No bruits.   Genitourinary:  Genitourinary Comments: No cva tenderness.  Musculoskeletal: Normal range of motion. She exhibits no edema or tenderness.  Neurological: She is alert and oriented to person, place, and time. No cranial nerve deficit.  Speech clear/fluent. Motor intact bilaterally. stre 5/5. Steady gait.   Skin: Skin is warm and dry. No rash noted. She is not diaphoretic.  Psychiatric: She has a normal mood and affect.  Nursing note and vitals reviewed.    ED Treatments / Results  Labs (all labs ordered are listed, but only abnormal results are displayed) Results for orders placed or performed during the hospital encounter of 10/15/15  CBC  Result Value Ref Range   WBC 10.3 4.0 - 10.5 K/uL   RBC 6.23 (H) 3.87 - 5.11 MIL/uL   Hemoglobin 12.2 12.0 - 15.0 g/dL   HCT 39.0 36.0 - 46.0 %   MCV 62.6 (L) 78.0 - 100.0 fL   MCH 19.6 (L) 26.0 - 34.0 pg   MCHC 31.3 30.0 - 36.0 g/dL   RDW 15.8 (H) 11.5 - 15.5 %   Platelets 294 150 - 400 K/uL  Basic metabolic panel  Result Value Ref Range   Sodium 138 135 - 145 mmol/L   Potassium 3.6 3.5 - 5.1 mmol/L   Chloride 106 101 - 111 mmol/L   CO2 25 22 - 32 mmol/L   Glucose, Bld  103 (H) 65 - 99 mg/dL   BUN 10 6 - 20 mg/dL   Creatinine, Ser 0.72 0.44 - 1.00 mg/dL   Calcium 8.4 (L) 8.9 - 10.3 mg/dL   GFR calc non Af Amer >60 >60 mL/min   GFR calc Af Amer >60 >60 mL/min   Anion gap 7 5 - 15    EKG  EKG Interpretation  Date/Time:  Tuesday October 15 2015 08:58:31 EDT Ventricular Rate:  93 PR Interval:    QRS Duration: 80 QT Interval:  409 QTC Calculation: 509 R Axis:   72 Text Interpretation:  Sinus rhythm No significant change since last tracing Confirmed by Ashok Cordia  MD, Lennette Bihari ([id number]) on 10/15/2015 9:02:33 AM       Radiology No results found.  Procedures Procedures (including critical care time)  Medications Ordered in ED Medications  cloNIDine (CATAPRES) tablet 0.3 mg (0.3 mg Oral Given 10/15/15 0920)  hydrALAZINE (APRESOLINE) tablet 75 mg (75 mg Oral Given 10/15/15 0921)  hydrochlorothiazide (HYDRODIURIL) tablet 50 mg (50 mg Oral Given 10/15/15 0920)     Initial Impression / Assessment and Plan / ED Course  I have reviewed the triage vital signs and the nursing notes.  Pertinent labs & imaging results that were available during my care of the patient were reviewed by me and considered in my medical decision making (see chart for details).  Clinical Course    Reviewed nursing notes and prior charts for additional history.   Patient given a dose of her home meds, including clonidine, hctz, and hydralazine.   Labs sent.   On review prior charts, todays bp seems in range of priors.  Long hx poorly controlled bp.   Morphine iv for pain. Zofran.  SW consulted.   SW/CM has assessed and met with patient, she indicates has given her resources in terms of f/u orange card/medicaid issues, that pt can get meds for $4 if we give her rx, and that she has arranged f/u outpt appt with Dr Doreene Burke.   I went back  to last d/c summary to verify bp meds. rx given.   On recheck, past few bps higher than prior - no new symptoms. On checking patients bp cuff,  she had a large cuff very loosely applied to forearm.  When I reapplied cuff and rechecked bp, it was much improved.   Patient comfortable on recheck. No pain. No sob or increased wob.   Patients labs c/w baseline.  Patient currently appears stable for d/c.       Final Clinical Impressions(s) / ED Diagnoses   Final diagnoses:  None    New Prescriptions New Prescriptions   No medications on file         Lajean Saver, MD 10/15/15 1414

## 2015-10-15 NOTE — Progress Notes (Signed)
Entered in d/c instructions  Sharon Seller, NP  Family Medicine [phone] [id number] [place] [address]   Next Steps: Go on 11/29/2015  Instructions: You have been given a follow up appointment with Sharon Seller on 11/29/15 at 2 pm If any thing changes please contact this office to update them   Please use the resources provided to you in emergency room by case manager to assist you're your choice of doctor for follow up     These [place] uninsured resources provide possible primary care providers, resources for discounted medications, housing, dental resources, affordable care act information, plus other resources for [employer]    Instructions: A referral for you has been sent to [employer] for community care network if you have not received a call in 3 days you may contact them Call Sylvie Farrier at [email or web address]

## 2015-10-15 NOTE — Progress Notes (Signed)
Consulted by ED unit secretary for med and pcp assistance Difficulty determining pt coverage -registration indicates ? Medicaid Pt states no insurance but applies for disability without approval at this time Pt dozing during cm assessment- female at beside - unable to complete assessment/interventions

## 2015-10-15 NOTE — Progress Notes (Addendum)
ED Cm went back to see pt with ED RN after discussing coverage with ED registration, speaking with EDP, Steinl to get an idea of which medications he would be providing for pt if d/c, speaking with [facility] staff for earliest appt and sending referral to [facility] for pt  Pt more alert Female continues to be at bedside Cm inquired if pt was still seeing Dr Emilee Hero Bonsu Pt stated she started seeing him when she had medicaid but her medicaid "has now been cut off" and she had not seen him . CM discussed with her that he could still see her but would charge her an uninsured pt rate Pt states she could not do that because she stays with her son and another family member who "take care of me"    Cm discussed the f/u appt on 11/29/15 at [facility] at 2 pm made for her Pt voiced understanding   Cm reviewed that Cm reviewed the medications the EDP d/c stated he would write if she were to d/c Discussed 2 of them are at [facility] for $4 cost Cm had checked and pt lists [facility] records as her preferred pharmacy.  Discussed the good rx cost sheets for the others that range from $7-15 and provided copies of the good rx coupon discount cards for the last 2 meds. CM asked pt if she would be able to get and take the medications and discussed notes that she was not being compliant with medications Pt sat up in bed, look at female and informed him to tell CM she takes her medications Pt states to Cm that when she does not take medications is when she "don;t get a prescription from the doctor"  Pt also states she had [facility]" Cm asked which provider had not been offering her Rx and she states [facility]. Cm informed her Cm was not familiar with [facility] as a pcp provider. Cm later goggled [facility] to find out it is a counseling agency not a pcp.  Pt does have PMH bipolar listed  Pt asked for assist to get up to go to restroom Cm assisted by removing leads, helping her to get her slippers and directing her to restroom  Left  the uninsured [place] on pt bedside table containing written information to assist pt with determining choice for uninsured accepting pcps, discussed the importance of pcp vs EDP services for f/u care, www.needymeds.org, www.goodrx.com, discounted pharmacies and other [employer] such as [employer] [facility] [employer], affordable care act, financial assistance, uninsured dental services, [place] med assist, DSS and  health department, uninsured accepting pcps like [facility], family medicine at [address], [facility], [facility], local urgent care centers, [facility], [facility] family practice, general medical clinics, family services of the [place], [facility] urgent care plus others, medication resources, [facility] out patient [facility]ies and housing Provided [employer] contact information   ED CM updated ED RN and EDP pt able to get medications with a Rx

## 2015-10-15 NOTE — Progress Notes (Signed)
Spoke with ED CM regarding patient and determined patient's needs are for case management.    Genice Rouge [id number] ED CSW 10/15/2015 12:38 PM

## 2015-10-15 NOTE — Discharge Instructions (Signed)
It was our pleasure to provide your ER care today - we hope that you feel better.  You must follow up with primary care doctor in the coming week for recheck of blood pressure.  Failure to control your blood pressure will result in kidney failure, and will also increase your risk of stroke and heart disease.  Take medications as prescribed.  Return to ER if worse, new symptoms, severe headache, chest pain, trouble breathing, other concern.  You were given pain medication in the ER - no driving for the next 4 hours.

## 2015-10-15 NOTE — ED Triage Notes (Signed)
Pt reports headache and photophobia, sts out of her BP meds x 2 days. Also sts chest tightness radiating to back. Also reports weakness.

## 2015-11-03 ENCOUNTER — Emergency Department (HOSPITAL_COMMUNITY): Payer: Self-pay

## 2015-11-03 ENCOUNTER — Inpatient Hospital Stay (HOSPITAL_COMMUNITY)
Admission: EM | Admit: 2015-11-03 | Discharge: 2015-11-05 | DRG: 305 | Disposition: A | Discharge status: Home / Self Care | Payer: Self-pay | Attending: Internal Medicine | Admitting: Internal Medicine

## 2015-11-03 ENCOUNTER — Encounter (HOSPITAL_COMMUNITY): Payer: Self-pay | Admitting: Emergency Medicine

## 2015-11-03 DIAGNOSIS — Z9119 Patient's noncompliance with other medical treatment and regimen: Secondary | ICD-10-CM

## 2015-11-03 DIAGNOSIS — M549 Dorsalgia, unspecified: Secondary | ICD-10-CM | POA: Diagnosis present

## 2015-11-03 DIAGNOSIS — I10 Essential (primary) hypertension: Secondary | ICD-10-CM | POA: Diagnosis present

## 2015-11-03 DIAGNOSIS — I161 Hypertensive emergency: Secondary | ICD-10-CM | POA: Diagnosis present

## 2015-11-03 DIAGNOSIS — Z9114 Patient's other noncompliance with medication regimen: Secondary | ICD-10-CM

## 2015-11-03 DIAGNOSIS — K219 Gastro-esophageal reflux disease without esophagitis: Secondary | ICD-10-CM | POA: Diagnosis present

## 2015-11-03 DIAGNOSIS — F17210 Nicotine dependence, cigarettes, uncomplicated: Secondary | ICD-10-CM | POA: Diagnosis present

## 2015-11-03 DIAGNOSIS — Z720 Tobacco use: Secondary | ICD-10-CM

## 2015-11-03 DIAGNOSIS — I248 Other forms of acute ischemic heart disease: Secondary | ICD-10-CM | POA: Diagnosis present

## 2015-11-03 DIAGNOSIS — D573 Sickle-cell trait: Secondary | ICD-10-CM | POA: Diagnosis present

## 2015-11-03 DIAGNOSIS — E876 Hypokalemia: Secondary | ICD-10-CM | POA: Diagnosis present

## 2015-11-03 DIAGNOSIS — E669 Obesity, unspecified: Secondary | ICD-10-CM | POA: Diagnosis present

## 2015-11-03 DIAGNOSIS — F319 Bipolar disorder, unspecified: Secondary | ICD-10-CM | POA: Diagnosis present

## 2015-11-03 DIAGNOSIS — F32A Depression, unspecified: Secondary | ICD-10-CM | POA: Diagnosis present

## 2015-11-03 DIAGNOSIS — F17200 Nicotine dependence, unspecified, uncomplicated: Secondary | ICD-10-CM | POA: Diagnosis present

## 2015-11-03 DIAGNOSIS — R079 Chest pain, unspecified: Secondary | ICD-10-CM

## 2015-11-03 DIAGNOSIS — I160 Hypertensive urgency: Principal | ICD-10-CM | POA: Diagnosis present

## 2015-11-03 DIAGNOSIS — Z6841 Body Mass Index (BMI) 40.0 and over, adult: Secondary | ICD-10-CM

## 2015-11-03 DIAGNOSIS — F329 Major depressive disorder, single episode, unspecified: Secondary | ICD-10-CM

## 2015-11-03 DIAGNOSIS — G8929 Other chronic pain: Secondary | ICD-10-CM | POA: Diagnosis present

## 2015-11-03 DIAGNOSIS — R1032 Left lower quadrant pain: Secondary | ICD-10-CM

## 2015-11-03 DIAGNOSIS — R109 Unspecified abdominal pain: Secondary | ICD-10-CM | POA: Diagnosis present

## 2015-11-03 LAB — BASIC METABOLIC PANEL
ANION GAP: 7 (ref 5–15)
BUN: 7 mg/dL (ref 6–20)
CHLORIDE: 102 mmol/L (ref 101–111)
CO2: 27 mmol/L (ref 22–32)
Calcium: 8.8 mg/dL — ABNORMAL LOW (ref 8.9–10.3)
Creatinine, Ser: 0.93 mg/dL (ref 0.44–1.00)
GFR calc Af Amer: >60 mL/min (ref >60)
GLUCOSE: 112 mg/dL — AB (ref 65–99)
POTASSIUM: 3.4 mmol/L — AB (ref 3.5–5.1)
SODIUM: 136 mmol/L (ref 135–145)

## 2015-11-03 LAB — I-STAT BETA HCG BLOOD, ED (MC, WL, AP ONLY)

## 2015-11-03 LAB — PROTIME-INR
INR: 0.87
PROTHROMBIN TIME: 11.8 s (ref 11.4–15.2)

## 2015-11-03 LAB — CBC
HEMATOCRIT: 42.6 % (ref 36.0–46.0)
HEMOGLOBIN: 13.1 g/dL (ref 12.0–15.0)
MCH: 19.4 pg — ABNORMAL LOW (ref 26.0–34.0)
MCHC: 30.8 g/dL (ref 30.0–36.0)
MCV: 63.1 fL — AB (ref 78.0–100.0)
Platelets: 326 10*3/uL (ref 150–400)
RBC: 6.75 MIL/uL — ABNORMAL HIGH (ref 3.87–5.11)
RDW: 15.7 % — ABNORMAL HIGH (ref 11.5–15.5)
WBC: 9.1 10*3/uL (ref 4.0–10.5)

## 2015-11-03 LAB — I-STAT TROPONIN, ED: Troponin i, poc: 0.01 ng/mL (ref 0.00–0.08)

## 2015-11-03 MED ORDER — PAROXETINE HCL 20 MG PO TABS
20.0000 mg | ORAL_TABLET | Freq: Every day | ORAL | Status: DC
  Administered 2015-11-04 – 2015-11-05 (×2): 20 mg via ORAL
  Filled 2015-11-03 (×2): qty 1

## 2015-11-03 MED ORDER — HYDRALAZINE HCL 20 MG/ML IJ SOLN
5.0000 mg | INTRAMUSCULAR | Status: DC | PRN
  Administered 2015-11-04 (×2): 5 mg via INTRAVENOUS
  Filled 2015-11-03 (×3): qty 1

## 2015-11-03 MED ORDER — POTASSIUM CHLORIDE 20 MEQ/15ML (10%) PO SOLN
20.0000 meq | Freq: Once | ORAL | Status: AC
  Administered 2015-11-03: 20 meq via ORAL
  Filled 2015-11-03: qty 15

## 2015-11-03 MED ORDER — ACETAMINOPHEN 650 MG RE SUPP: 650.0000 mg | Freq: Four times a day (QID) | RECTAL | Status: DC | PRN

## 2015-11-03 MED ORDER — DM-GUAIFENESIN ER 30-600 MG PO TB12: 1.0000 | ORAL_TABLET | Freq: Two times a day (BID) | ORAL | Status: DC | PRN

## 2015-11-03 MED ORDER — OXYMETAZOLINE HCL 0.05 % NA SOLN
1.0000 | Freq: Once | NASAL | Status: AC
  Administered 2015-11-03: 1 via NASAL
  Filled 2015-11-03: qty 15

## 2015-11-03 MED ORDER — LISINOPRIL 10 MG PO TABS
10.0000 mg | ORAL_TABLET | Freq: Once | ORAL | Status: AC
  Administered 2015-11-03: 10 mg via ORAL
  Filled 2015-11-03: qty 1

## 2015-11-03 MED ORDER — NITROGLYCERIN 0.4 MG SL SUBL: 0.4000 mg | SUBLINGUAL_TABLET | SUBLINGUAL | Status: DC | PRN

## 2015-11-03 MED ORDER — HYDROCODONE-ACETAMINOPHEN 5-325 MG PO TABS
1.0000 | ORAL_TABLET | ORAL | Status: DC | PRN
  Administered 2015-11-04 – 2015-11-05 (×5): 1 via ORAL
  Filled 2015-11-03 (×5): qty 1

## 2015-11-03 MED ORDER — PANTOPRAZOLE SODIUM 40 MG PO TBEC
40.0000 mg | DELAYED_RELEASE_TABLET | Freq: Every day | ORAL | Status: DC
  Administered 2015-11-04 – 2015-11-05 (×2): 40 mg via ORAL
  Filled 2015-11-03 (×2): qty 1

## 2015-11-03 MED ORDER — IBUPROFEN 400 MG PO TABS
600.0000 mg | ORAL_TABLET | Freq: Once | ORAL | Status: AC
  Administered 2015-11-03: 600 mg via ORAL
  Filled 2015-11-03: qty 1

## 2015-11-03 MED ORDER — HYDRALAZINE HCL 50 MG PO TABS
50.0000 mg | ORAL_TABLET | Freq: Three times a day (TID) | ORAL | Status: DC
  Administered 2015-11-04: 50 mg via ORAL
  Filled 2015-11-03: qty 1

## 2015-11-03 MED ORDER — ACETAMINOPHEN 500 MG PO TABS
1000.0000 mg | ORAL_TABLET | Freq: Once | ORAL | Status: DC
  Filled 2015-11-03: qty 2

## 2015-11-03 MED ORDER — SODIUM CHLORIDE 0.9% FLUSH
3.0000 mL | Freq: Two times a day (BID) | INTRAVENOUS | Status: DC
  Administered 2015-11-03 – 2015-11-05 (×4): 3 mL via INTRAVENOUS

## 2015-11-03 MED ORDER — HYDROCHLOROTHIAZIDE 25 MG PO TABS
50.0000 mg | ORAL_TABLET | Freq: Once | ORAL | Status: AC
  Administered 2015-11-03: 50 mg via ORAL
  Filled 2015-11-03: qty 2

## 2015-11-03 MED ORDER — ACETAMINOPHEN 325 MG PO TABS: 650.0000 mg | ORAL_TABLET | Freq: Four times a day (QID) | ORAL | Status: DC | PRN

## 2015-11-03 MED ORDER — HYDRALAZINE HCL 25 MG PO TABS
50.0000 mg | ORAL_TABLET | Freq: Once | ORAL | Status: AC
  Administered 2015-11-03: 50 mg via ORAL
  Filled 2015-11-03: qty 2

## 2015-11-03 MED ORDER — HYDROCHLOROTHIAZIDE 25 MG PO TABS
50.0000 mg | ORAL_TABLET | Freq: Every day | ORAL | Status: DC
  Administered 2015-11-04 – 2015-11-05 (×2): 50 mg via ORAL
  Filled 2015-11-03 (×2): qty 2

## 2015-11-03 MED ORDER — IBUPROFEN 600 MG PO TABS
600.0000 mg | ORAL_TABLET | Freq: Four times a day (QID) | ORAL | Status: DC | PRN
  Administered 2015-11-04 – 2015-11-05 (×4): 600 mg via ORAL
  Filled 2015-11-03 (×4): qty 1

## 2015-11-03 MED ORDER — ENOXAPARIN SODIUM 80 MG/0.8ML ~~LOC~~ SOLN: 70.0000 mg | SUBCUTANEOUS | Status: DC

## 2015-11-03 MED ORDER — ONDANSETRON HCL 4 MG/2ML IJ SOLN
4.0000 mg | Freq: Three times a day (TID) | INTRAMUSCULAR | Status: DC | PRN
  Administered 2015-11-05: 4 mg via INTRAVENOUS
  Filled 2015-11-03: qty 2

## 2015-11-03 MED ORDER — ALBUTEROL SULFATE (2.5 MG/3ML) 0.083% IN NEBU: 3.0000 mL | INHALATION_SOLUTION | Freq: Four times a day (QID) | RESPIRATORY_TRACT | Status: DC | PRN

## 2015-11-03 MED ORDER — CLONIDINE HCL 0.2 MG PO TABS: 0.3000 mg | ORAL_TABLET | Freq: Three times a day (TID) | ORAL | Status: DC

## 2015-11-03 MED ORDER — MORPHINE SULFATE (PF) 2 MG/ML IV SOLN
2.0000 mg | INTRAVENOUS | Status: DC | PRN
  Administered 2015-11-04 (×2): 2 mg via INTRAVENOUS
  Filled 2015-11-03 (×2): qty 1

## 2015-11-03 MED ORDER — LISINOPRIL 2.5 MG PO TABS: 2.5000 mg | ORAL_TABLET | Freq: Every day | ORAL | Status: DC

## 2015-11-03 MED ORDER — CARVEDILOL 25 MG PO TABS
25.0000 mg | ORAL_TABLET | Freq: Two times a day (BID) | ORAL | Status: DC
  Administered 2015-11-04 – 2015-11-05 (×4): 25 mg via ORAL
  Filled 2015-11-03 (×4): qty 1

## 2015-11-03 MED ORDER — NICOTINE 21 MG/24HR TD PT24
21.0000 mg | MEDICATED_PATCH | Freq: Every day | TRANSDERMAL | Status: DC
  Administered 2015-11-04: 21 mg via TRANSDERMAL
  Filled 2015-11-03: qty 1

## 2015-11-03 MED ORDER — MORPHINE SULFATE (PF) 4 MG/ML IV SOLN
4.0000 mg | Freq: Once | INTRAVENOUS | Status: AC
Start: 2015-11-03 — End: 2015-11-03
  Administered 2015-11-03: 4 mg via INTRAVENOUS
  Filled 2015-11-03: qty 1

## 2015-11-03 MED ORDER — IOPAMIDOL (ISOVUE-300) INJECTION 61%
INTRAVENOUS | Status: AC
  Administered 2015-11-04: 100 mL
  Filled 2015-11-03: qty 100

## 2015-11-03 MED ORDER — NITROGLYCERIN 0.2 MG/HR TD PT24
0.2000 mg | MEDICATED_PATCH | Freq: Once | TRANSDERMAL | Status: AC
  Administered 2015-11-03: 0.2 mg via TRANSDERMAL
  Filled 2015-11-03: qty 1

## 2015-11-03 MED ORDER — ASPIRIN 325 MG PO TABS
325.0000 mg | ORAL_TABLET | Freq: Every day | ORAL | Status: DC
  Administered 2015-11-04: 325 mg via ORAL
  Filled 2015-11-03: qty 1

## 2015-11-03 MED ORDER — IOPAMIDOL (ISOVUE-300) INJECTION 61%
INTRAVENOUS | Status: AC
  Filled 2015-11-03: qty 30

## 2015-11-03 MED ORDER — CLONIDINE HCL 0.2 MG PO TABS
0.3000 mg | ORAL_TABLET | Freq: Once | ORAL | Status: AC
  Administered 2015-11-03: 0.3 mg via ORAL
  Filled 2015-11-03: qty 1

## 2015-11-03 NOTE — ED Triage Notes (Signed)
Pt sts ran out of BP meds and now having HA and CP and abd pain

## 2015-11-03 NOTE — ED Notes (Signed)
Patient endorses she had cramps in her stomach and complains of a knott in her left upper quadrant.  Wants [place] to scan it

## 2015-11-03 NOTE — ED Notes (Signed)
Patient A&Ox4 and stable for transport at this time. Patient will be taken to 2W on telemetry.

## 2015-11-03 NOTE — ED Notes (Signed)
Per nurse pt enroute to inpatient floor..no lab draw

## 2015-11-03 NOTE — H&P (Addendum)
History and Physical    Kelly Novak [id number] DOB: [birth year] DOA: 11/03/2015  Referring MD/NP/PA:   PCP: Benito Mccreedy, MD   Patient coming from:  The patient is coming from home.  At baseline, pt is independent for most of ADL.   Chief Complaint: Blood pressure elevation, chest pain, headache, abdominal pain, nausea, vomiting  HPI: Kelly Novak is a 45 y.o. female with medical history significant of hypertension, GERD, depression, bipolar, sickle cell trait, morbid obesity, chronic back pain, tobacco abuse, who presents with blood pressure elevation, chest pain, headache, abdominal pain, nausea, vomiting.  Patient has  hx of poorly controlled blood pressure. She is supposed to take 5 medications for blood pressure, but has not been completely compliant to medications. She states that she ran out of her medications for 3 days. Her blood pressure is elevated. She developed headache, which involves the whole head, constant, 10 out of 10 severity nonradiating. She has generalized weakness, but no unilateral weakness, numbness or tingling in extremities, no vision change or hearing loss.  She also reports chest pain. It is located in the left side of chest, constant, 10 out of 10 severity, nonradiating. It is not aggravated or alleviated by any known factors. She has mild shortness of breath and mild chronic cough due to smoking, which is baseline. No fever or chills.  Patient states that she has intermittent abdominal pain for almost 3 months. The pain is located in the left lower quadrant, crampy like pain, 10 out of 10 in severity with happens. She states that her abdominal pain is radiating to both upper legs. She has nausea and vomited 7 times today. She does not have diarrhea. She has increased urinary frequency, but denies dysuria or burning on urination. She states that she had one episode of mild nose bleeding which has resolved.  ED Course: pt was found to have elevated blood  pressure 222/138, which improved to 172/116 after treated with home oral medications in ED. Troponin negative, negative pregnancy test, WBC 9.1, temperature normal, tachycardia, tachypnea, potassium 3.4, creatinine normal, negative CT-head for acute intracranial abnormalities. Patient is placed on telemetry bed for observation.  Review of Systems:   General: no fevers, chills, no changes in body weight, has poor appetite, has fatigue. Has HA. HEENT: no blurry vision, hearing changes or sore throat Respiratory: has dyspnea, coughing, no wheezing CV: Has chest pain, no palpitations GI: has nausea, vomiting, abdominal pain, no diarrhea, constipation GU: no dysuria, burning on urination, has  increased urinary frequency, no hematuria  Ext: no leg edema Neuro: no unilateral weakness, numbness, or tingling, no vision change or hearing loss Skin: no rash, no skin tear. MSK: No muscle spasm, no deformity, no limitation of range of movement in spin Heme: No easy bruising.  Travel history: No recent long distant travel.  Allergy: No Known Allergies  Past Medical History:  Diagnosis Date  . Anemia   . Anxiety   . Arthritis   . Bipolar 1 disorder ([place])   . Blood dyscrasia    sickle cell trait  . Chronic back pain   . GERD (gastroesophageal reflux disease)   . Hypertension   . Obesity   . Shortness of breath    on exertion due to weight    Past Surgical History:  Procedure Laterality Date  . CESAREAN SECTION    . CHOLECYSTECTOMY    . HYSTEROSCOPY N/A 08/23/2013   Procedure: HYSTEROSCOPY D&C WITH HYDROTHERMAL ABLATION;  Surgeon: Osborne Oman, MD;  Location: [facility];  Service: Gynecology;  Laterality: N/A;  . TUBAL LIGATION      Social History:  reports that she has been smoking Cigarettes.  She has been smoking about 0.00 packs per day for the past 14.00 years. She has never used smokeless tobacco. She reports that she does not drink alcohol or use drugs.  Family History:  Family  History  Problem Relation Age of Onset  . CAD Father   . Heart failure Brother   . Hypertension Brother   . CAD Paternal Aunt      Prior to Admission medications   Medication Sig Start Date End Date Taking? Authorizing Provider  albuterol (PROVENTIL HFA;VENTOLIN HFA) 108 (90 Base) MCG/ACT inhaler Inhale 2 puffs into the lungs every 4 (four) hours as needed for wheezing or shortness of breath. Patient not taking: Reported on 10/15/2015 03/16/15   Shanon Rosser, MD  amoxicillin (AMOXIL) 500 MG capsule Take 1 capsule (500 mg total) by mouth 3 (three) times daily. Patient not taking: Reported on 10/15/2015 08/24/15   Isla Pence, MD  carvedilol (COREG) 25 MG tablet Take 1 tablet (25 mg total) by mouth 2 (two) times daily with a meal. 10/15/15   Lajean Saver, MD  cloNIDine (CATAPRES) 0.3 MG tablet Take 1 tablet (0.3 mg total) by mouth 3 (three) times daily. **Patient is overdue for an appt.Please call and schedule for further refills** 10/15/15   Lajean Saver, MD  cyclobenzaprine (FLEXERIL) 10 MG tablet Take 1 tablet (10 mg total) by mouth 2 (two) times daily as needed for muscle spasms. Patient not taking: Reported on 10/15/2015 10/04/14   Charlann Lange, PA-C  hydrALAZINE (APRESOLINE) 50 MG tablet Take 1 tablet (50 mg total) by mouth 3 (three) times daily. TAKE THREE TABLETS BY MOUTH EVERY 8 HOURS 10/15/15   Lajean Saver, MD  hydrochlorothiazide (HYDRODIURIL) 50 MG tablet Take 1 tablet (50 mg total) by mouth daily. 10/15/15   Lajean Saver, MD  HYDROcodone-acetaminophen (NORCO/VICODIN) 5-325 MG tablet Take 1 tablet by mouth every 4 (four) hours as needed. Patient not taking: Reported on 10/15/2015 08/24/15   Isla Pence, MD  ibuprofen (ADVIL,MOTRIN) 200 MG tablet Take 600 mg by mouth every 6 (six) hours as needed for headache or moderate pain.     Historical Provider, MD  lisinopril (PRINIVIL,ZESTRIL) 2.5 MG tablet Take 1 tablet (2.5 mg total) by mouth daily. 10/15/15   Lajean Saver, MD  omeprazole  (PRILOSEC) 20 MG capsule Take 20 mg by mouth 3 (three) times daily before meals. Reported on 08/24/2015    Historical Provider, MD  PARoxetine (PAXIL) 20 MG tablet Take 1 tablet (20 mg total) by mouth daily. 03/16/15   John Molpus, MD  traZODone (DESYREL) 100 MG tablet Take 1 tablet (100 mg total) by mouth at bedtime. Patient not taking: Reported on 10/15/2015 06/07/13   Elmarie Shiley, MD    Physical Exam: Vitals:   11/03/15 2030 11/03/15 2100 11/03/15 2130 11/03/15 2148  BP: (!) 201/90 (!) 216/112 173/95 (!) 172/116  Pulse: 95 106 100 103  Resp: 16 23 17  (!) 28  Temp:    98.3 F (36.8 C)  TempSrc:    Oral  SpO2: 99% 100% 99% 98%  Weight:      Height:       General: Not in acute distress. Morbid obesity HEENT:       Eyes: PERRL, EOMI, no scleral icterus.       ENT: No discharge from the ears and nose, no pharynx  injection, no tonsillar enlargement.        Neck: No JVD, no bruit, no mass felt. Heme: No neck lymph node enlargement. Cardiac: S1/S2, RRR, No murmurs, No gallops or rubs. Respiratory: No rales, wheezing, rhonchi or rubs. GI: Soft, nondistended, has tenderness over LLQ, no rebound pain, no organomegaly, BS present. GU: No hematuria Ext: No pitting leg edema bilaterally. 2+DP/PT pulse bilaterally. Musculoskeletal: No joint deformities, No joint redness or warmth, no limitation of ROM in spin. Skin: No rashes.  Neuro: Alert, oriented X3, cranial nerves II-XII grossly intact, moves all extremities normally. Psych: Patient is not psychotic, no suicidal or hemocidal ideation.  Labs on Admission: I have personally reviewed following labs and imaging studies  CBC:  Recent Labs Lab 11/03/15 1727  WBC 9.1  HGB 13.1  HCT 42.6  MCV 63.1*  PLT [id number]   Basic Metabolic Panel:  Recent Labs Lab 11/03/15 1727  NA 136  K 3.4*  CL 102  CO2 27  GLUCOSE 112*  BUN 7  CREATININE 0.93  CALCIUM 8.8*   GFR: Estimated Creatinine Clearance: 117 mL/min (by C-G formula based on  SCr of 0.93 mg/dL). Liver Function Tests: No results for input(s): AST, ALT, ALKPHOS, BILITOT, PROT, ALBUMIN in the last 168 hours. No results for input(s): LIPASE, AMYLASE in the last 168 hours. No results for input(s): AMMONIA in the last 168 hours. Coagulation Profile: No results for input(s): INR, PROTIME in the last 168 hours. Cardiac Enzymes: No results for input(s): CKTOTAL, CKMB, CKMBINDEX, TROPONINI in the last 168 hours. BNP (last 3 results) No results for input(s): PROBNP in the last 8760 hours. HbA1C: No results for input(s): HGBA1C in the last 72 hours. CBG: No results for input(s): GLUCAP in the last 168 hours. Lipid Profile: No results for input(s): CHOL, HDL, LDLCALC, TRIG, CHOLHDL, LDLDIRECT in the last 72 hours. Thyroid Function Tests: No results for input(s): TSH, T4TOTAL, FREET4, T3FREE, THYROIDAB in the last 72 hours. Anemia Panel: No results for input(s): VITAMINB12, FOLATE, FERRITIN, TIBC, IRON, RETICCTPCT in the last 72 hours. Urine analysis:    Component Value Date/Time   COLORURINE YELLOW 06/06/2013 1815   APPEARANCEUR CLEAR 06/06/2013 1815   LABSPEC 1.015 06/06/2013 1815   PHURINE 7.0 06/06/2013 1815   GLUCOSEU NEGATIVE 06/06/2013 1815   HGBUR NEGATIVE 06/06/2013 1815   BILIRUBINUR NEGATIVE 06/06/2013 1815   KETONESUR NEGATIVE 06/06/2013 1815   PROTEINUR 30 (A) 06/06/2013 1815   UROBILINOGEN 0.2 06/06/2013 1815   NITRITE NEGATIVE 06/06/2013 1815   LEUKOCYTESUR NEGATIVE 06/06/2013 1815   Sepsis Labs: @LABRCNTIP (procalcitonin:4,lacticidven:4) )No results found for this or any previous visit (from the past 240 hour(s)).   Radiological Exams on Admission: Dg Chest 2 View  Result Date: 11/03/2015 CLINICAL DATA:  Chest pain.  Cough and congestion. EXAM: CHEST  2 VIEW COMPARISON:  March 15, 2015 FINDINGS: The heart size and mediastinal contours are within normal limits. Both lungs are clear. The visualized skeletal structures are unremarkable.  IMPRESSION: No active cardiopulmonary disease. Electronically Signed   By: Dorise Bullion III M.D   On: 11/03/2015 18:01   Ct Head Wo Contrast  Result Date: 11/03/2015 CLINICAL DATA:  Headache, onset yesterday. Nausea today. Hypertension. EXAM: CT HEAD WITHOUT CONTRAST TECHNIQUE: Contiguous axial images were obtained from the base of the skull through the vertex without intravenous contrast. COMPARISON:  06/06/2013 FINDINGS: Brain: There is no intracranial hemorrhage, mass or evidence of acute infarction. There is mild generalized atrophy. There is mild chronic microvascular ischemic change. 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 There is no significant extra-axial fluid collection. No acute intracranial findings are evident. Vascular: No hyperdense vessel or unexpected calcification. Skull: The calvarium and skullbase are intact. Sinuses/Orbits: Visible paranasal sinuses and orbits are unremarkable. Other: IMPRESSION: No acute intracranial findings. Remote right pontine infarction. There is mild generalized atrophy and chronic appearing white matter hypodensities which likely represent small vessel ischemic disease. Electronically Signed   By: Andreas Newport M.D.   On: 11/03/2015 21:44     EKG: Independently reviewed. Sinus rhythm, QTC 475, LAE, no ischemic change    Assessment/Plan Principal Problem:   Hypertensive urgency Active Problems:   Smoker   Chest pain   Depression   GERD (gastroesophageal reflux disease)   Hypokalemia   Chronic back pain   Abdominal pain   Hypertensive urgency: This is obviously due to medication noncompliance. pt was found to have elevated blood pressure 222/138, which improved to 172/116 after treated with home oral medications in ED. CT head is negative for acute intracranial abnormalities. No focal neurologic findings on physical examination.  -Will place on telemetry bed for observation -Resume home bp Meds: Coreg, clonidine, hydralazine, HCTZ,  lisinopril -IV hydralazine when necessary -one dose of nitroglycerin patch 0.2 mg 1 -Consult to Education officer, museum and case manager  Chest pain: Likely due to demanding ischemia secondary to hypertensive emergency. Initial troponin negative. - cycle CE q6 x3 and repeat her EKG in the am  - Nitroglycerin, Morphine, and aspirin - Risk factor stratification: will check FLP, UDS and A1C  - 2d echo   HA: Her headache has subsided with improvement of blood pressure control. -prn Tylenol  GERD: -Protonix  Hypokalemia: K=3.4 on admission. - Repleted  Tobacco abuse: -Did counseling about importance of quitting smoking -Nicotine patch  Chronic back pain: -prn Nocor  Depression: Stable, no suicidal or homicidal ideations. -Continue home medications: Paxil  Abdominal pain: Patient states that she has intermittent left lower quadrant abdominal pain for almost 3 months. Today she has nausea and vomited 7 times without blood in the vomitus. Etiology is not clear. -will check Lipase -Ct-abd/pelvis with contrast -prn Zofran for nausea and morphine for pain   DVT ppx: SCD  Code Status: Full code Family Communication: Yes, patient's fianc and "sister"  at bed side Disposition Plan:  Anticipate discharge back to previous home environment Consults called:  none Admission status: Obs / tele   Date of Service 11/03/2015    Ivor Costa [facility] Pager [phone]  If 7PM-7AM, please contact night-coverage [email or web address] Password [email or web address] 11/03/2015, 11:01 PM

## 2015-11-03 NOTE — ED Notes (Signed)
Niu MD at bedside. 

## 2015-11-03 NOTE — ED Provider Notes (Signed)
[facility] DEPT Provider Note   CSN: [id number] Arrival date & time: 11/03/15  1717     History   Chief Complaint Chief Complaint  Patient presents with  . Hypertension  . Chest Pain    HPI Shandi E Viano is a 45 y.o. female.  HPI  45 year old female who presents with elevated blood pressure, chest pain, and headache. She has a history of hypertension, obesity, and bipolar disorder. Was seen in the emergency department at the end of August for elevated blood pressure and headaches. She had been out of her blood pressure medications except for her clonidine for several months. It was arranged for her to get an orange card and she prescriptions for refills. States that she has not been able to get any of her prescriptions filled as she did not have any money and is unable to get any prescriptions filled until Thursday of this week. States that she has been having progressively worsening headache and nausea and vomiting today. Has noted that over the past week to 2 weeks with activity she has had cramping lower chest wall pain, abdominal cramping, and like cramping. Denies any focal numbness or weakness, vision or speech changes, difficulty breathing, or passing out.    Past Medical History:  Diagnosis Date  . Anemia   . Anxiety   . Arthritis   . Bipolar 1 disorder ([place])   . Blood dyscrasia    sickle cell trait  . Chronic back pain   . GERD (gastroesophageal reflux disease)   . Hypertension   . Obesity   . Shortness of breath    on exertion due to weight    Patient Active Problem List   Diagnosis Date Noted  . Depression 11/03/2015  . GERD (gastroesophageal reflux disease) 11/03/2015  . Hypokalemia 11/03/2015  . Abdominal pain 11/03/2015  . Chronic back pain   . Abnormal uterine bleeding 08/10/2013  . Uterine fibroids 08/10/2013  . ASCUS pap with negative HRHPV 08/10/2013  . Hypertensive emergency 06/06/2013  . Chest pain 06/06/2013  . Hypertensive urgency  06/06/2013  . Pes planus (flat feet) 09/02/2012  . Anemia 09/02/2012  . Foot pain 09/02/2012  . Smoker 09/02/2012    Past Surgical History:  Procedure Laterality Date  . CESAREAN SECTION    . CHOLECYSTECTOMY    . HYSTEROSCOPY N/A 08/23/2013   Procedure: HYSTEROSCOPY D&C WITH HYDROTHERMAL ABLATION;  Surgeon: Osborne Oman, MD;  Location: [facility];  Service: Gynecology;  Laterality: N/A;  . TUBAL LIGATION      OB History    Gravida Para Term Preterm AB Living   4 4 4     2    SAB TAB Ectopic Multiple Live Births           4       Home Medications    Prior to Admission medications   Medication Sig Start Date End Date Taking? Authorizing Provider  albuterol (PROVENTIL HFA;VENTOLIN HFA) 108 (90 Base) MCG/ACT inhaler Inhale 2 puffs into the lungs every 4 (four) hours as needed for wheezing or shortness of breath. Patient not taking: Reported on 10/15/2015 03/16/15   Shanon Rosser, MD  amoxicillin (AMOXIL) 500 MG capsule Take 1 capsule (500 mg total) by mouth 3 (three) times daily. Patient not taking: Reported on 10/15/2015 08/24/15   Isla Pence, MD  carvedilol (COREG) 25 MG tablet Take 1 tablet (25 mg total) by mouth 2 (two) times daily with a meal. 10/15/15   Lajean Saver, MD  cloNIDine (CATAPRES)  0.3 MG tablet Take 1 tablet (0.3 mg total) by mouth 3 (three) times daily. **Patient is overdue for an appt.Please call and schedule for further refills** 10/15/15   Lajean Saver, MD  cyclobenzaprine (FLEXERIL) 10 MG tablet Take 1 tablet (10 mg total) by mouth 2 (two) times daily as needed for muscle spasms. Patient not taking: Reported on 10/15/2015 10/04/14   Charlann Lange, PA-C  hydrALAZINE (APRESOLINE) 50 MG tablet Take 1 tablet (50 mg total) by mouth 3 (three) times daily. TAKE THREE TABLETS BY MOUTH EVERY 8 HOURS 10/15/15   Lajean Saver, MD  hydrochlorothiazide (HYDRODIURIL) 50 MG tablet Take 1 tablet (50 mg total) by mouth daily. 10/15/15   Lajean Saver, MD  HYDROcodone-acetaminophen  (NORCO/VICODIN) 5-325 MG tablet Take 1 tablet by mouth every 4 (four) hours as needed. Patient not taking: Reported on 10/15/2015 08/24/15   Isla Pence, MD  ibuprofen (ADVIL,MOTRIN) 200 MG tablet Take 600 mg by mouth every 6 (six) hours as needed for headache or moderate pain.     Historical Provider, MD  lisinopril (PRINIVIL,ZESTRIL) 2.5 MG tablet Take 1 tablet (2.5 mg total) by mouth daily. 10/15/15   Lajean Saver, MD  omeprazole (PRILOSEC) 20 MG capsule Take 20 mg by mouth 3 (three) times daily before meals. Reported on 08/24/2015    Historical Provider, MD  PARoxetine (PAXIL) 20 MG tablet Take 1 tablet (20 mg total) by mouth daily. 03/16/15   John Molpus, MD  traZODone (DESYREL) 100 MG tablet Take 1 tablet (100 mg total) by mouth at bedtime. Patient not taking: Reported on 10/15/2015 06/07/13   Elmarie Shiley, MD    Family History Family History  Problem Relation Age of Onset  . CAD Father   . Heart failure Brother   . Hypertension Brother   . CAD Paternal Aunt     Social History Social History  Substance Use Topics  . Smoking status: Current Every Day Smoker    Packs/day: 0.00    Years: 14.00    Types: Cigarettes  . Smokeless tobacco: Never Used  . Alcohol use No     Allergies   Review of patient's allergies indicates no known allergies.   Review of Systems Review of Systems 10/14 systems reviewed and are negative other than those stated in the HPI   Physical Exam Updated Vital Signs BP (!) 172/116 (BP Location: Left Arm)   Pulse 103   Temp 98.3 F (36.8 C) (Oral)   Resp (!) 28   Ht 5\' 7"  (1.702 m)   Wt (!) 320 lb (145.2 kg)   SpO2 98%   BMI 50.12 kg/m   Physical Exam Physical Exam  Nursing note and vitals reviewed. Constitutional: Anxious, non-toxic, and in no acute distress Head: Normocephalic and atraumatic.  Mouth/Throat: Oropharynx is clear and moist.  Neck: Normal range of motion. Neck supple.  Cardiovascular: Tachycardic rate and regular rhythm.   No LE edema. Pulmonary/Chest: Effort normal and breath sounds normal.  Abdominal: Soft. There is no significant tenderness. There is no rebound and no guarding. OBese Musculoskeletal: Normal range of motion.  Neurological: Alert, no facial droop, fluent speech, moves all extremities symmetrically, sensation to light touch in tact throughout Skin: Skin is warm and dry.  Psychiatric: Cooperative   ED Treatments / Results  Labs (all labs ordered are listed, but only abnormal results are displayed) Labs Reviewed  BASIC METABOLIC PANEL - Abnormal; Notable for the following:       Result Value   Potassium 3.4 (*)  Glucose, Bld 112 (*)    Calcium 8.8 (*)    All other components within normal limits  CBC - Abnormal; Notable for the following:    RBC 6.75 (*)    MCV 63.1 (*)    MCH 19.4 (*)    RDW 15.7 (*)    All other components within normal limits  LIPASE, BLOOD  URINE RAPID DRUG SCREEN, HOSP PERFORMED  HEMOGLOBIN A1C  LIPID PANEL  TROPONIN I  TROPONIN I  TROPONIN I  PROTIME-INR  BASIC METABOLIC PANEL  CBC  URINALYSIS, ROUTINE W REFLEX MICROSCOPIC (NOT AT [facility])  I-STAT TROPOININ, ED  I-STAT BETA HCG BLOOD, ED ([facility], [facility], [facility] ONLY)    EKG  EKG Interpretation  Date/Time:  Sunday November 03 2015 19:17:32 EDT Ventricular Rate:  107 PR Interval:    QRS Duration: 80 QT Interval:  356 QTC Calculation: 475 R Axis:   62 Text Interpretation:  Sinus tachycardia LAE, consider biatrial enlargement Similar to prior EKG  Confirmed by Nianna Igo MD, Caeli Linehan [id number]) on 11/03/2015 7:26:01 PM       Radiology Dg Chest 2 View  Result Date: 11/03/2015 CLINICAL DATA:  Chest pain.  Cough and congestion. EXAM: CHEST  2 VIEW COMPARISON:  March 15, 2015 FINDINGS: The heart size and mediastinal contours are within normal limits. Both lungs are clear. The visualized skeletal structures are unremarkable. IMPRESSION: No active cardiopulmonary disease. Electronically Signed   By: Dorise Bullion III M.D    On: 11/03/2015 18:01   Ct Head Wo Contrast  Result Date: 11/03/2015 CLINICAL DATA:  Headache, onset yesterday. Nausea today. Hypertension. EXAM: CT HEAD WITHOUT CONTRAST TECHNIQUE: Contiguous axial images were obtained from the base of the skull through the vertex without intravenous contrast. COMPARISON:  06/06/2013 FINDINGS: Brain: There is no intracranial hemorrhage, mass or evidence of acute infarction. There is mild generalized atrophy. There is mild chronic microvascular ischemic change. 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 There is no significant extra-axial fluid collection. No acute intracranial findings are evident. Vascular: No hyperdense vessel or unexpected calcification. Skull: The calvarium and skullbase are intact. Sinuses/Orbits: Visible paranasal sinuses and orbits are unremarkable. Other: IMPRESSION: No acute intracranial findings. Remote right pontine infarction. There is mild generalized atrophy and chronic appearing white matter hypodensities which likely represent small vessel ischemic disease. Electronically Signed   By: Andreas Newport M.D.   On: 11/03/2015 21:44    Procedures Procedures (including critical care time)  Medications Ordered in ED Medications  carvedilol (COREG) tablet 25 mg (not administered)  cloNIDine (CATAPRES) tablet 0.3 mg (0.3 mg Oral Not Given 11/03/15 2247)  hydrALAZINE (APRESOLINE) tablet 50 mg (50 mg Oral Not Given 11/03/15 2248)  hydrochlorothiazide (HYDRODIURIL) tablet 50 mg (not administered)  lisinopril (PRINIVIL,ZESTRIL) tablet 2.5 mg (not administered)  HYDROcodone-acetaminophen (NORCO/VICODIN) 5-325 MG per tablet 1 tablet (not administered)  ibuprofen (ADVIL,MOTRIN) tablet 600 mg (not administered)  albuterol (PROVENTIL) (2.5 MG/3ML) 0.083% nebulizer solution 3 mL (not administered)  PARoxetine (PAXIL) tablet 20 mg (not administered)  pantoprazole (PROTONIX) EC tablet 40 mg (not administered)  hydrALAZINE (APRESOLINE) injection 5 mg  (not administered)  potassium chloride 20 MEQ/15ML (10%) solution 20 mEq (not administered)  ondansetron (ZOFRAN) injection 4 mg (not administered)  nitroGLYCERIN (NITROSTAT) SL tablet 0.4 mg (not administered)  morphine 2 MG/ML injection 2 mg (not administered)  nicotine (NICODERM CQ - dosed in mg/24 hours) patch 21 mg (not administered)  sodium chloride flush (NS) 0.9 % injection 3 mL (not administered)  acetaminophen (TYLENOL) tablet 650 mg (  not administered)    Or  acetaminophen (TYLENOL) suppository 650 mg (not administered)  aspirin tablet 325 mg (not administered)  dextromethorphan-guaiFENesin (MUCINEX DM) 30-600 MG per 12 hr tablet 1 tablet (not administered)  cloNIDine (CATAPRES) tablet 0.3 mg (0.3 mg Oral Given 11/03/15 1956)  hydrALAZINE (APRESOLINE) tablet 50 mg (50 mg Oral Given 11/03/15 1957)  hydrochlorothiazide (HYDRODIURIL) tablet 50 mg (50 mg Oral Given 11/03/15 1956)  ibuprofen (ADVIL,MOTRIN) tablet 600 mg (600 mg Oral Given 11/03/15 2005)  oxymetazoline (AFRIN) 0.05 % nasal spray 1 spray (1 spray Each Nare Given 11/03/15 2057)  morphine 4 MG/ML injection 4 mg (4 mg Intravenous Given 11/03/15 2208)  lisinopril (PRINIVIL,ZESTRIL) tablet 10 mg (10 mg Oral Given 11/03/15 2208)     Initial Impression / Assessment and Plan / ED Course  I have reviewed the triage vital signs and the nursing notes.  Pertinent labs & imaging results that were available during my care of the patient were reviewed by me and considered in my medical decision making (see chart for details).  Clinical Course    45 year old female who presents with headache and chest pain in the setting of elevated BPs. 200-210/120-130s in ED. Neuro in tact, but given escalating HA and vomiting CT obtained and negative for acute intracranial processes. EKG w/o ischemic changes and troponin negative, but having exertional symptoms. Given clonidine, HCTZ, hydralazine, coreg and lisinopril with improvement in BP 170s/110s  but still very symptomatic despite pain management. Discussed with Dr. Blaine Hamper who will admit for hypertensive urgency.  Final Clinical Impressions(s) / ED Diagnoses   Final diagnoses:  Hypertensive urgency  Chronic back pain  Abdominal pain    New Prescriptions Current Discharge Medication List       Forde Dandy, MD 11/03/15 2310

## 2015-11-03 NOTE — ED Notes (Signed)
Delay in lab draw,  Pt not in room at this time. 

## 2015-11-04 ENCOUNTER — Encounter (HOSPITAL_COMMUNITY): Payer: Self-pay | Admitting: Radiology

## 2015-11-04 ENCOUNTER — Observation Stay (HOSPITAL_COMMUNITY): Payer: Self-pay

## 2015-11-04 DIAGNOSIS — R079 Chest pain, unspecified: Secondary | ICD-10-CM

## 2015-11-04 DIAGNOSIS — I160 Hypertensive urgency: Principal | ICD-10-CM

## 2015-11-04 LAB — CBC
HCT: 42.1 % (ref 36.0–46.0)
Hemoglobin: 13.2 g/dL (ref 12.0–15.0)
MCH: 19.7 pg — ABNORMAL LOW (ref 26.0–34.0)
MCHC: 31.4 g/dL (ref 30.0–36.0)
MCV: 62.9 fL — ABNORMAL LOW (ref 78.0–100.0)
Platelets: 344 10*3/uL (ref 150–400)
RBC: 6.69 MIL/uL — ABNORMAL HIGH (ref 3.87–5.11)
RDW: 15.8 % — ABNORMAL HIGH (ref 11.5–15.5)
WBC: 9.2 10*3/uL (ref 4.0–10.5)

## 2015-11-04 LAB — RAPID URINE DRUG SCREEN, HOSP PERFORMED
Amphetamines: NOT DETECTED
BARBITURATES: NOT DETECTED
Benzodiazepines: POSITIVE — AB
Cocaine: POSITIVE — AB
OPIATES: POSITIVE — AB
TETRAHYDROCANNABINOL: POSITIVE — AB

## 2015-11-04 LAB — BASIC METABOLIC PANEL
Anion gap: 10 (ref 5–15)
BUN: 7 mg/dL (ref 6–20)
CO2: 28 mmol/L (ref 22–32)
Calcium: 9.1 mg/dL (ref 8.9–10.3)
Chloride: 101 mmol/L (ref 101–111)
Creatinine, Ser: 0.95 mg/dL (ref 0.44–1.00)
GFR calc Af Amer: >60 mL/min (ref >60)
GFR calc non Af Amer: >60 mL/min (ref >60)
Glucose, Bld: 112 mg/dL — ABNORMAL HIGH (ref 65–99)
Potassium: 3 mmol/L — ABNORMAL LOW (ref 3.5–5.1)
Sodium: 139 mmol/L (ref 135–145)

## 2015-11-04 LAB — LIPASE, BLOOD: Lipase: 20 U/L (ref 11–51)

## 2015-11-04 LAB — TROPONIN I
Troponin I: 0.03 ng/mL (ref <0.03)
Troponin I: 0.03 ng/mL (ref <0.03)
Troponin I: 0.06 ng/mL (ref <0.03)

## 2015-11-04 LAB — URINE MICROSCOPIC-ADD ON

## 2015-11-04 LAB — LIPID PANEL
Cholesterol: 196 mg/dL (ref 0–200)
HDL: 47 mg/dL (ref >40)
LDL Cholesterol: 99 mg/dL (ref 0–99)
TRIGLYCERIDES: 249 mg/dL — AB (ref <150)
Total CHOL/HDL Ratio: 4.2 RATIO
VLDL: 50 mg/dL — ABNORMAL HIGH (ref 0–40)

## 2015-11-04 LAB — URINALYSIS, ROUTINE W REFLEX MICROSCOPIC
Bilirubin Urine: NEGATIVE
Glucose, UA: NEGATIVE mg/dL
Hgb urine dipstick: NEGATIVE
Ketones, ur: NEGATIVE mg/dL
Leukocytes, UA: NEGATIVE
Nitrite: NEGATIVE
Protein, ur: 30 mg/dL — AB
Specific Gravity, Urine: 1.021 (ref 1.005–1.030)
pH: 6 (ref 5.0–8.0)

## 2015-11-04 LAB — MAGNESIUM: MAGNESIUM: 2 mg/dL (ref 1.7–2.4)

## 2015-11-04 MED ORDER — POTASSIUM CHLORIDE CRYS ER 20 MEQ PO TBCR
40.0000 meq | EXTENDED_RELEASE_TABLET | Freq: Once | ORAL | Status: AC
  Administered 2015-11-04: 40 meq via ORAL
  Filled 2015-11-04: qty 2

## 2015-11-04 MED ORDER — MORPHINE SULFATE (PF) 2 MG/ML IV SOLN
2.0000 mg | Freq: Once | INTRAVENOUS | Status: AC
  Administered 2015-11-04: 2 mg via INTRAVENOUS
  Filled 2015-11-04: qty 1

## 2015-11-04 MED ORDER — HYDRALAZINE HCL 50 MG PO TABS
100.0000 mg | ORAL_TABLET | Freq: Three times a day (TID) | ORAL | Status: DC
  Administered 2015-11-04 – 2015-11-05 (×4): 100 mg via ORAL
  Filled 2015-11-04 (×4): qty 2

## 2015-11-04 MED ORDER — CLONIDINE HCL 0.2 MG PO TABS
0.3000 mg | ORAL_TABLET | Freq: Every day | ORAL | Status: DC
  Administered 2015-11-04: 0.3 mg via ORAL
  Filled 2015-11-04: qty 1

## 2015-11-04 MED ORDER — LISINOPRIL 10 MG PO TABS
10.0000 mg | ORAL_TABLET | Freq: Every day | ORAL | Status: DC
  Administered 2015-11-04 – 2015-11-05 (×2): 10 mg via ORAL
  Filled 2015-11-04 (×2): qty 1

## 2015-11-04 MED ORDER — ENOXAPARIN SODIUM 40 MG/0.4ML ~~LOC~~ SOLN
40.0000 mg | SUBCUTANEOUS | Status: DC
  Administered 2015-11-04 – 2015-11-05 (×2): 40 mg via SUBCUTANEOUS
  Filled 2015-11-04 (×2): qty 0.4

## 2015-11-04 MED ORDER — ASPIRIN EC 81 MG PO TBEC
81.0000 mg | DELAYED_RELEASE_TABLET | Freq: Every day | ORAL | Status: DC
  Administered 2015-11-05: 81 mg via ORAL
  Filled 2015-11-04: qty 1

## 2015-11-04 MED ORDER — NICOTINE 21 MG/24HR TD PT24: 21.0000 mg | MEDICATED_PATCH | TRANSDERMAL | Status: DC

## 2015-11-04 NOTE — Progress Notes (Addendum)
PROGRESS NOTE  Kelly Novak [id number] DOB: Jun 14, 1970 DOA: 11/03/2015 PCP: Benito Mccreedy, MD  HPI/Recap of past 24 hours:  Patient seen with two daughters in room Report feeling better  Assessment/Plan: Principal Problem:   Hypertensive urgency Active Problems:   Smoker   Chest pain   Depression   GERD (gastroesophageal reflux disease)   Hypokalemia   Chronic back pain   Abdominal pain   HTN urgency,  bp on presentation 222/138, with chest pain and headache, in the setting of medication noncompliance and polysubstance abuse including cocaine/benzo/opiod/THC, though patient denies substance abuse 9/11, bp better, but still significantly elevated, increase hydralazine/lisinopril, continue max dose of coreg, taper off  clonidine in this noncompliant patient, change clonidine  from three times a day to qhs. Will defer secondary HTN work up to outpatient, will need education for medication compliance, weight loss,   Chest pain in the setting of htn urgency and cocaine use  Risk factor for coronary artery disease including obesity, current everyday smoker,   troponin 0.03-0.03, EKG sinus tachycardia, no st/t changes, ldl 99, a1c pending, echo pending  Headache: in the setting of htn urgency and substance abuse Ct head:  No acute intracranial findings. Remote right pontine infarction. There is mild generalized atrophy and chronic appearing white matter hypodensities which likely represent small vessel ischemic disease.  She also report nitro patch make her to have headache  Ab pain:  CT ab no acute findings.  Hypokalemia:  Patient is on hctz Replace k, check mag  H/o sickle cell trait by patient's report, hgb stable.  Chronic back pain: on flexeril / nsaids/ norco at home  Depression/bipolar: continue home meds paxil, avoid substance abuse  Morbid obesity: Body mass index is 49.59 kg/m. life style modification. Will need outpatient sleep study  Smoking  cessation education provided, now on nicotine patch. Everyday smoker.   Polysubstance abuse,patient report she use cocaine/htc/drink alcohol daily social worker Optometrist consulted  Medication and medical follow up noncompliance: case manger consulted   DVT ppx: SCD  Code Status: Full code Family Communication: Yes, patient and her two daughters in room, patient used to work as a Quarry manager, one of her daughter is getting a Scientist, research (physical sciences). Disposition Plan:  Anticipate discharge back to previous home environment in 24-48hrs if bp continue to improve, no further cardiac work up needed, pending echo result  Consults called:  none   Procedures:  none  Antibiotics:  none   Objective: BP (!) 174/94   Pulse (!) 110   Temp 98.2 F (36.8 C) (Oral)   Resp 20   Ht 5\' 7"  (1.702 m)   Wt (!) 143.6 kg (316 lb 9.6 oz)   SpO2 100%   BMI 49.59 kg/m  No intake or output data in the 24 hours ending 11/04/15 0811 Filed Weights   11/03/15 1918 11/03/15 2315  Weight: (!) 145.2 kg (320 lb) (!) 143.6 kg (316 lb 9.6 oz)    Exam:   General:  NAD, obese  Cardiovascular: RRR  Respiratory: CTABL  Abdomen: Soft/ND/NT, positive BS  Musculoskeletal: No Edema  Neuro: aaox3  Data Reviewed: Basic Metabolic Panel:  Recent Labs Lab 11/03/15 1727 11/04/15 0547  NA 136 139  K 3.4* 3.0*  CL 102 101  CO2 27 28  GLUCOSE 112* 112*  BUN 7 7  CREATININE 0.93 0.95  CALCIUM 8.8* 9.1   Liver Function Tests: No results for input(s): AST, ALT, ALKPHOS, BILITOT, PROT, ALBUMIN in the last 168 hours.  Recent  Labs Lab 11/03/15 2309  LIPASE 20   No results for input(s): AMMONIA in the last 168 hours. CBC:  Recent Labs Lab 11/03/15 1727 11/04/15 0547  WBC 9.1 9.2  HGB 13.1 13.2  HCT 42.6 42.1  MCV 63.1* 62.9*  PLT 326 344   Cardiac Enzymes:    Recent Labs Lab 11/03/15 2309 11/04/15 0547  TROPONINI 0.03* 0.03*   BNP (last 3 results)  Recent Labs  03/16/15 0055  BNP 29.1     ProBNP (last 3 results) No results for input(s): PROBNP in the last 8760 hours.  CBG: No results for input(s): GLUCAP in the last 168 hours.  No results found for this or any previous visit (from the past 240 hour(s)).   Studies: Dg Chest 2 View  Result Date: 11/03/2015 CLINICAL DATA:  Chest pain.  Cough and congestion. EXAM: CHEST  2 VIEW COMPARISON:  March 15, 2015 FINDINGS: The heart size and mediastinal contours are within normal limits. Both lungs are clear. The visualized skeletal structures are unremarkable. IMPRESSION: No active cardiopulmonary disease. Electronically Signed   By: Dorise Bullion III M.D   On: 11/03/2015 18:01   Ct Head Wo Contrast  Result Date: 11/03/2015 CLINICAL DATA:  Headache, onset yesterday. Nausea today. Hypertension. EXAM: CT HEAD WITHOUT CONTRAST TECHNIQUE: Contiguous axial images were obtained from the base of the skull through the vertex without intravenous contrast. COMPARISON:  06/06/2013 FINDINGS: Brain: There is no intracranial hemorrhage, mass or evidence of acute infarction. There is mild generalized atrophy. There is mild chronic microvascular ischemic change. 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 There is no significant extra-axial fluid collection. No acute intracranial findings are evident. Vascular: No hyperdense vessel or unexpected calcification. Skull: The calvarium and skullbase are intact. Sinuses/Orbits: Visible paranasal sinuses and orbits are unremarkable. Other: IMPRESSION: No acute intracranial findings. Remote right pontine infarction. There is mild generalized atrophy and chronic appearing white matter hypodensities which likely represent small vessel ischemic disease. Electronically Signed   By: Andreas Newport M.D.   On: 11/03/2015 21:44   Ct Abdomen Pelvis W Contrast  Result Date: 11/04/2015 CLINICAL DATA:  Abdominal pain, nausea and vomiting, onset at 04:00 yesterday EXAM: CT ABDOMEN AND PELVIS WITH CONTRAST TECHNIQUE:  Multidetector CT imaging of the abdomen and pelvis was performed using the standard protocol following bolus administration of intravenous contrast. CONTRAST:  17mL ISOVUE-300 IOPAMIDOL (ISOVUE-300) INJECTION 61% COMPARISON:  12/09/2011 FINDINGS: Lower chest: No significant abnormality Hepatobiliary: Mild diffuse fatty infiltration of the liver. Cholecystectomy. Normal bile ducts. Pancreas: Normal Spleen: Normal Adrenals/Urinary Tract: The adrenals and kidneys are normal in appearance. There is no urinary calculus evident. There is no hydronephrosis or ureteral dilatation. Collecting systems and ureters appear unremarkable. Stomach/Bowel: There are normal appearances of the stomach, small bowel and colon. The appendix is normal. Vascular/Lymphatic: The abdominal aorta is normal in caliber. There is mild atherosclerotic calcification. There is no adenopathy in the abdomen or pelvis. Reproductive: Uterus and adnexal structures are unremarkable. Other: No acute inflammatory changes are evident in the abdomen or pelvis. There is no ascites. Small fat containing umbilical hernia incidentally noted. Musculoskeletal: No significant skeletal lesions. IMPRESSION: Fatty infiltration of the liver. Small fat containing umbilical hernia. No acute findings are evident in the abdomen or pelvis. Electronically Signed   By: Andreas Newport M.D.   On: 11/04/2015 02:17    Scheduled Meds: . aspirin  325 mg Oral Daily  . carvedilol  25 mg Oral BID WC  . cloNIDine  0.3 mg Oral  QHS  . hydrALAZINE  100 mg Oral Q8H  . hydrochlorothiazide  50 mg Oral Daily  . iopamidol      . lisinopril  10 mg Oral Daily  . nicotine  21 mg Transdermal Daily  . nitroGLYCERIN  0.2 mg Transdermal Once  . pantoprazole  40 mg Oral Daily  . PARoxetine  20 mg Oral Daily  . potassium chloride  40 mEq Oral Once  . sodium chloride flush  3 mL Intravenous Q12H    Continuous Infusions:    Time spent: 26mins  Lehman Whiteley MD, PhD  [facility] Pager [phone]. If 7PM-7AM, please contact night-coverage at [email or web address], password [facility] 11/04/2015, 8:11 AM  LOS: 0 days

## 2015-11-04 NOTE — Progress Notes (Signed)
Nitro patch removed and site cleaned per verbal order from Dr. Erlinda Hong.

## 2015-11-04 NOTE — Progress Notes (Signed)
Patient's BP sustaining 190s after PRN medications given.  Pt also states she has 7/10 chest tightness.  EKG done x2 and not loading to EPIC - placed in patient's chart.  NP Schorr notified, new orders received.  RN will continue to monitor.  Claudette Stapler, RN

## 2015-11-04 NOTE — Care Management Note (Signed)
Case Management Note Kelly Gibbons RN, BSN Unit 2W-Case Manager [phone]  Patient Details  Name: Kelly Novak MRN: [id number] Date of Birth: [birth year]  Subjective/Objective:   Pt admitted with  HTN                 Action/Plan: PTA pt lived at home with family- referral for medication needs and PCP- spoke with pt at bedside with daughter present- per conversation pt states that she used to go to Dr. Vista Lawman when she had medicaid but has not see PCP since she lost Medicaid- she now has no insurance- per pt she has an upcoming appointment with [facility] on Oct. 6 at 2pm- pt uses- Product/process development scientist on [address] states that she can pay $3 copay for meds- is eligible for MATCH if needed to assist with medications at discharge- CM will review d/c meds to assess need for [facility] prior to discharge.   Expected Discharge Date:                  Expected Discharge Plan:  Home/Self Care  In-House Referral:     Discharge planning Services  CM Consult, Medication Assistance  Post Acute Care Choice:    Choice offered to:     DME Arranged:    DME Agency:     HH Arranged:    HH Agency:     Status of Service:  In process, will continue to follow  If discussed at Long Length of Stay Meetings, dates discussed:    Additional Comments:  Dawayne Patricia, RN 11/04/2015, 4:07 PM

## 2015-11-04 NOTE — Progress Notes (Signed)
  Echocardiogram 2D Echocardiogram has been performed.  Kelly Novak 11/04/2015, 1:21 PM

## 2015-11-05 DIAGNOSIS — R0789 Other chest pain: Secondary | ICD-10-CM

## 2015-11-05 LAB — COMPREHENSIVE METABOLIC PANEL
ALBUMIN: 3.5 g/dL (ref 3.5–5.0)
ALT: 25 U/L (ref 14–54)
AST: 23 U/L (ref 15–41)
Alkaline Phosphatase: 83 U/L (ref 38–126)
Anion gap: 10 (ref 5–15)
BILIRUBIN TOTAL: 1 mg/dL (ref 0.3–1.2)
BUN: 11 mg/dL (ref 6–20)
CHLORIDE: 100 mmol/L — AB (ref 101–111)
CO2: 26 mmol/L (ref 22–32)
CREATININE: 0.91 mg/dL (ref 0.44–1.00)
Calcium: 9 mg/dL (ref 8.9–10.3)
GFR calc Af Amer: >60 mL/min (ref >60)
GLUCOSE: 103 mg/dL — AB (ref 65–99)
POTASSIUM: 3.6 mmol/L (ref 3.5–5.1)
Sodium: 136 mmol/L (ref 135–145)
Total Protein: 7 g/dL (ref 6.5–8.1)

## 2015-11-05 LAB — MAGNESIUM: Magnesium: 2 mg/dL (ref 1.7–2.4)

## 2015-11-05 LAB — HEMOGLOBIN A1C
Hgb A1c MFr Bld: 5.9 % — ABNORMAL HIGH (ref 4.8–5.6)
Mean Plasma Glucose: 123 mg/dL

## 2015-11-05 LAB — GLUCOSE, CAPILLARY: Glucose-Capillary: 118 mg/dL — ABNORMAL HIGH (ref 65–99)

## 2015-11-05 MED ORDER — CLONIDINE HCL 0.1 MG PO TABS: 0.1000 mg | ORAL_TABLET | Freq: Every day | ORAL | Status: DC

## 2015-11-05 MED ORDER — CLONIDINE HCL 0.1 MG PO TABS: 0.3000 mg | ORAL_TABLET | Freq: Every day | ORAL | 0 refills | Status: DC

## 2015-11-05 MED ORDER — NITROGLYCERIN 0.4 MG SL SUBL: 0.4000 mg | SUBLINGUAL_TABLET | SUBLINGUAL | 0 refills | Status: DC | PRN

## 2015-11-05 MED ORDER — CARVEDILOL 25 MG PO TABS: 25.0000 mg | ORAL_TABLET | Freq: Two times a day (BID) | ORAL | 0 refills | Status: DC

## 2015-11-05 MED ORDER — HYDRALAZINE HCL 100 MG PO TABS: 100.0000 mg | ORAL_TABLET | Freq: Three times a day (TID) | ORAL | 0 refills | Status: DC

## 2015-11-05 MED ORDER — POTASSIUM CHLORIDE CRYS ER 20 MEQ PO TBCR
40.0000 meq | EXTENDED_RELEASE_TABLET | Freq: Once | ORAL | Status: AC
  Administered 2015-11-05: 40 meq via ORAL
  Filled 2015-11-05: qty 2

## 2015-11-05 MED ORDER — LISINOPRIL 10 MG PO TABS: 10.0000 mg | ORAL_TABLET | Freq: Every day | ORAL | 0 refills | Status: DC

## 2015-11-05 MED ORDER — ASPIRIN 81 MG PO TBEC: 81.0000 mg | DELAYED_RELEASE_TABLET | Freq: Every day | ORAL | 0 refills | Status: DC

## 2015-11-05 MED ORDER — HYDROCHLOROTHIAZIDE 50 MG PO TABS: 50.0000 mg | ORAL_TABLET | Freq: Every day | ORAL | 0 refills | Status: DC

## 2015-11-05 NOTE — Progress Notes (Signed)
CM provided pt most recent updated copy of [facility]'s $4 medication list- and highlighted pt's current meds that are on the list- CM to continue to follow for any further Needs.

## 2015-11-05 NOTE — Discharge Summary (Signed)
Discharge Summary  RYLE TORTORELLO [id number] DOB: 02/07/1971  PCP: Benito Mccreedy, MD  Admit date: 11/03/2015 Discharge date: 11/05/2015  Time spent: <23mins  Recommendations for Outpatient Follow-up:  1. F/u with PMD within a week  for hospital discharge follow up, repeat cbc/bmp at follow up 2. Patient is advised to adhere to her medication regimen and avoid substance abuse, she is advised to check her blood pressure at home and bring in record for your primary care physician to review.  Discharge Diagnoses:  Active Hospital Problems   Diagnosis Date Noted  . Hypertensive urgency 06/06/2013  . Depression 11/03/2015  . GERD (gastroesophageal reflux disease) 11/03/2015  . Hypokalemia 11/03/2015  . Abdominal pain 11/03/2015  . Chronic back pain   . Chest pain 06/06/2013  . Smoker 09/02/2012    Resolved Hospital Problems   Diagnosis Date Noted Date Resolved  No resolved problems to display.    Discharge Condition: stable  Diet recommendation: heart healthy/carb modified  Filed Weights   11/03/15 1918 11/03/15 2315  Weight: (!) 145.2 kg (320 lb) (!) 143.6 kg (316 lb 9.6 oz)    History of present illness:  Patient coming from:  The patient is coming from home.  At baseline, pt is independent for most of ADL.   Chief Complaint: Blood pressure elevation, chest pain, headache, abdominal pain, nausea, vomiting  HPI: Kelly Novak is a 45 y.o. female with medical history significant of hypertension, GERD, depression, bipolar, sickle cell trait, morbid obesity, chronic back pain, tobacco abuse, who presents with blood pressure elevation, chest pain, headache, abdominal pain, nausea, vomiting.  Patient has  hx of poorly controlled blood pressure. She is supposed to take 5 medications for blood pressure, but has not been completely compliant to medications. She states that she ran out of her medications for 3 days. Her blood pressure is elevated. She developed headache,  which involves the whole head, constant, 10 out of 10 severity nonradiating. She has generalized weakness, but no unilateral weakness, numbness or tingling in extremities, no vision change or hearing loss.  She also reports chest pain. It is located in the left side of chest, constant, 10 out of 10 severity, nonradiating. It is not aggravated or alleviated by any known factors. She has mild shortness of breath and mild chronic cough due to smoking, which is baseline. No fever or chills.  Patient states that she has intermittent abdominal pain for almost 3 months. The pain is located in the left lower quadrant, crampy like pain, 10 out of 10 in severity with happens. She states that her abdominal pain is radiating to both upper legs. She has nausea and vomited 7 times today. She does not have diarrhea. She has increased urinary frequency, but denies dysuria or burning on urination. She states that she had one episode of mild nose bleeding which has resolved.  ED Course: pt was found to have elevated blood pressure 222/138, which improved to 172/116 after treated with home oral medications in ED. Troponin negative, negative pregnancy test, WBC 9.1, temperature normal, tachycardia, tachypnea, potassium 3.4, creatinine normal, negative CT-head for acute intracranial abnormalities. Patient is placed on telemetry bed for observation.   Hospital Course:  Principal Problem:   Hypertensive urgency Active Problems:   Smoker   Chest pain   Depression   GERD (gastroesophageal reflux disease)   Hypokalemia   Chronic back pain   Abdominal pain   HTN urgency,  bp on presentation 222/138, with chest pain and headache, in the setting  of medication noncompliance and polysubstance abuse including cocaine/benzo/opiod/THC, though patient denies substance abuse 9/11, bp better, but still significantly elevated, increase hydralazine/lisinopril, continue max dose of coreg, change clonidine  from three times a  day to qhs.  Patient report she has been on clonidine since in her 80's , she use it for sleep, when she run out of prescription clonidine , she will go buy it on the street. Will defer secondary HTN work up to outpatient, will need education for medication compliance, weight loss,  bp better controlled at discharge, patient is to continue monitor blood pressure at home and work with her primary care physician for blood pressure meds adjustment.  Chest pain in the setting of htn urgency and cocaine use  Risk factor for coronary artery disease including obesity, current everyday smoker,   troponin 0.03-0.03, EKG sinus tachycardia, no st/t changes, ldl 99, a1c 5.9, echo resulted but not showing up on epic,  Cardiology consulted, detail please see cardiology note, she is cleared to discharge home by cardiology and she is recommended to follow up with her primary care physician.  Headache: in the setting of htn urgency and substance abuse, and likely sleep apnea Ct head:  No acute intracranial findings. Remote right pontine infarction. There is mild generalized atrophy and chronic appearing white matter hypodensities which likely represent small vessel ischemic disease.  She also report nitro patch make her to have headache  Need outpatient sleep study, patient aware.  Ab pain:  CT ab no acute findings.  Hypokalemia:  Patient is on hctz Replace k, mag wnl  H/o sickle cell trait by patient's report, hgb stable.  Chronic back pain: on flexeril / nsaids/ norco at home  Depression/bipolar: continue home meds paxil, avoid substance abuse  Morbid obesity: Body mass index is 49.59 kg/m. life style modification. Will need outpatient sleep study  Smoking cessation education provided, now on nicotine patch. Everyday smoker.  Polysubstance abuse,patient report she drinks alcohol daily, but stopped a month ago, she report use cocaine,THC,  social worker Optometrist  consulted  Medication and medical follow up noncompliance: case manger consulted, patient understands [facility] has 4doller meds.   DVT ppx: SCD  Code Status:Full code Family Communication: Yes, patient and her two daughters in room, patient used to work as a Quarry manager, one of her daughter is getting a Scientist, research (physical sciences). Disposition Plan: d/c home with cardiology clearance  Consults called:cardiology   Procedures:  none  Antibiotics:  none   Discharge Exam: BP (!) 159/85 (BP Location: Right Arm)   Pulse 79   Temp 98.2 F (36.8 C) (Oral)   Resp 17   Ht 5\' 7"  (1.702 m)   Wt (!) 143.6 kg (316 lb 9.6 oz)   SpO2 100%   BMI 49.59 kg/m     General:  NAD, obese  Cardiovascular: RRR  Respiratory: CTABL  Abdomen: Soft/ND/NT, positive BS  Musculoskeletal: No Edema  Neuro: aaox3   Discharge Instructions You were cared for by a hospitalist during your hospital stay. If you have any questions about your discharge medications or the care you received while you were in the hospital after you are discharged, you can call the unit and asked to speak with the hospitalist on call if the hospitalist that took care of you is not available. Once you are discharged, your primary care physician will handle any further medical issues. Please note that NO REFILLS for any discharge medications will be authorized once you are discharged, as it is imperative  that you return to your primary care physician (or establish a relationship with a primary care physician if you do not have one) for your aftercare needs so that they can reassess your need for medications and monitor your lab values.  Discharge Instructions    Diet - low sodium heart healthy    Complete by:  As directed   Low fat and carb modifed   Increase activity slowly    Complete by:  As directed       Medication List    TAKE these medications   aspirin 81 MG EC tablet Take 1 tablet (81 mg total) by mouth daily.    carvedilol 25 MG tablet Commonly known as:  COREG Take 1 tablet (25 mg total) by mouth 2 (two) times daily with a meal.   cloNIDine 0.1 MG tablet Commonly known as:  CATAPRES Take 3 tablets (0.3 mg total) by mouth at bedtime. What changed:  medication strength  when to take this  additional instructions   hydrALAZINE 100 MG tablet Commonly known as:  APRESOLINE Take 1 tablet (100 mg total) by mouth every 8 (eight) hours. What changed:  medication strength  how much to take  when to take this  additional instructions   hydrochlorothiazide 50 MG tablet Commonly known as:  HYDRODIURIL Take 1 tablet (50 mg total) by mouth daily.   ibuprofen 200 MG tablet Commonly known as:  ADVIL,MOTRIN Take 600 mg by mouth every 6 (six) hours as needed for headache or moderate pain.   lisinopril 10 MG tablet Commonly known as:  PRINIVIL,ZESTRIL Take 1 tablet (10 mg total) by mouth daily. What changed:  medication strength  how much to take   nitroGLYCERIN 0.4 MG SL tablet Commonly known as:  NITROSTAT Place 1 tablet (0.4 mg total) under the tongue every 5 (five) minutes as needed for chest pain.   omeprazole 20 MG capsule Commonly known as:  PRILOSEC Take 20 mg by mouth 3 (three) times daily before meals. Reported on 08/24/2015   PARoxetine 20 MG tablet Commonly known as:  PAXIL Take 1 tablet (20 mg total) by mouth daily.      No Known Allergies Follow-up Information    Mertie Moores, MD Follow up in 2 week(s).   Specialty:  Cardiology Why:  chest pain  PLEASE CALL TO SCHEDULE APPT Contact information: [address] [place] [address] [phone]        Benito Mccreedy, MD. Daphane Shepherd on 11/12/2015.   Specialty:  Internal Medicine Why:  hospital discharge follow up [place] [address], [id number] ON 9/19 AT 11:00 AM please check your blood pressure at home and bring in record for your primary care physician to review.  Contact  information: [id number] [place] [address] [phone]            The results of significant diagnostics from this hospitalization (including imaging, microbiology, ancillary and laboratory) are listed below for reference.    Significant Diagnostic Studies: Dg Chest 2 View  Result Date: 11/03/2015 CLINICAL DATA:  Chest pain.  Cough and congestion. EXAM: CHEST  2 VIEW COMPARISON:  March 15, 2015 FINDINGS: The heart size and mediastinal contours are within normal limits. Both lungs are clear. The visualized skeletal structures are unremarkable. IMPRESSION: No active cardiopulmonary disease. Electronically Signed   By: Dorise Bullion III M.D   On: 11/03/2015 18:01   Ct Head Wo Contrast  Result Date: 11/03/2015 CLINICAL DATA:  Headache, onset yesterday. Nausea today. Hypertension. EXAM: CT HEAD WITHOUT CONTRAST TECHNIQUE: Contiguous axial images were obtained from the base of the skull through the vertex without intravenous contrast. COMPARISON:  06/06/2013 FINDINGS: Brain: There is no intracranial hemorrhage, mass or evidence of acute infarction. There is mild generalized atrophy. There is mild chronic microvascular ischemic change. 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 There is no significant extra-axial fluid collection. No acute intracranial findings are evident. Vascular: No hyperdense vessel or unexpected calcification. Skull: The calvarium and skullbase are intact. Sinuses/Orbits: Visible paranasal sinuses and orbits are unremarkable. Other: IMPRESSION: No acute intracranial findings. Remote right pontine infarction. There is mild generalized atrophy and chronic appearing white matter hypodensities which likely represent small vessel ischemic disease. Electronically Signed   By: Andreas Newport M.D.   On: 11/03/2015 21:44   Ct Abdomen Pelvis W Contrast  Result Date: 11/04/2015 CLINICAL DATA:  Abdominal pain, nausea and vomiting, onset at 04:00 yesterday EXAM:  CT ABDOMEN AND PELVIS WITH CONTRAST TECHNIQUE: Multidetector CT imaging of the abdomen and pelvis was performed using the standard protocol following bolus administration of intravenous contrast. CONTRAST:  156mL ISOVUE-300 IOPAMIDOL (ISOVUE-300) INJECTION 61% COMPARISON:  12/09/2011 FINDINGS: Lower chest: No significant abnormality Hepatobiliary: Mild diffuse fatty infiltration of the liver. Cholecystectomy. Normal bile ducts. Pancreas: Normal Spleen: Normal Adrenals/Urinary Tract: The adrenals and kidneys are normal in appearance. There is no urinary calculus evident. There is no hydronephrosis or ureteral dilatation. Collecting systems and ureters appear unremarkable. Stomach/Bowel: There are normal appearances of the stomach, small bowel and colon. The appendix is normal. Vascular/Lymphatic: The abdominal aorta is normal in caliber. There is mild atherosclerotic calcification. There is no adenopathy in the abdomen or pelvis. Reproductive: Uterus and adnexal structures are unremarkable. Other: No acute inflammatory changes are evident in the abdomen or pelvis. There is no ascites. Small fat containing umbilical hernia incidentally noted. Musculoskeletal: No significant skeletal lesions. IMPRESSION: Fatty infiltration of the liver. Small fat containing umbilical hernia. No acute findings are evident in the abdomen or pelvis. Electronically Signed   By: Andreas Newport M.D.   On: 11/04/2015 02:17    Microbiology: No results found for this or any previous visit (from the past 240 hour(s)).   Labs: Basic Metabolic Panel:  Recent Labs Lab 11/03/15 1727 11/04/15 0547 11/04/15 1113 11/05/15 0420  NA 136 139  --  136  K 3.4* 3.0*  --  3.6  CL 102 101  --  100*  CO2 27 28  --  26  GLUCOSE 112* 112*  --  103*  BUN 7 7  --  11  CREATININE 0.93 0.95  --  0.91  CALCIUM 8.8* 9.1  --  9.0  MG  --   --  2.0 2.0   Liver Function Tests:  Recent Labs Lab 11/05/15 0420  AST 23  ALT 25  ALKPHOS 83   BILITOT 1.0  PROT 7.0  ALBUMIN 3.5    Recent Labs Lab 11/03/15 2309  LIPASE 20   No results for input(s): AMMONIA in the last 168 hours. CBC:  Recent Labs Lab 11/03/15 1727 11/04/15 0547  WBC 9.1 9.2  HGB 13.1 13.2  HCT 42.6 42.1  MCV 63.1* 62.9*  PLT 326 344   Cardiac Enzymes:  Recent Labs Lab 11/03/15 2309 11/04/15 0547 11/04/15 1113  TROPONINI 0.03* 0.03* 0.06*   BNP: BNP (last 3 results)  Recent Labs  03/16/15 0055  BNP 29.1    ProBNP (last 3 results) No results for input(s):  PROBNP in the last 8760 hours.  CBG:  Recent Labs Lab 11/05/15 0629  GLUCAP 118*       SignedFlorencia Reasons MD, PhD  [facility] 11/05/2015, 6:56 PM

## 2015-11-05 NOTE — Progress Notes (Signed)
Order received to discharge patient.  Patient expresses readiness to discharge.  Discharge instructions, follow up, medications and instructions for their use were discussed with patient and patient voiced understanding.  Telemetry monitor was removed and CCMD notified.   

## 2015-11-05 NOTE — Consult Note (Signed)
Cardiology Consult    Patient ID: Kelly Novak MRN: [id number], DOB/AGE: 1970-07-03   Admit date: 11/03/2015 Date of Consult: 11/05/2015  Primary Physician: Benito Mccreedy, MD Primary Cardiologist: New ([place] in 2016) Requesting Provider: Dr. Erlinda Hong Reason for Consultation: Chest pain  Patient Profile    45 year old female with past medical history of hypertension, obesity, bipolar, anxiety, GERD, and sickle cell trait who presented to the [facility] ER with reports of headache and chest pain.  Past Medical History   Past Medical History:  Diagnosis Date  . Anemia   . Anxiety   . Arthritis   . Bipolar 1 disorder ([place])   . Blood dyscrasia    sickle cell trait  . Chronic back pain   . GERD (gastroesophageal reflux disease)   . Hypertension   . Obesity   . Shortness of breath    on exertion due to weight    Past Surgical History:  Procedure Laterality Date  . CESAREAN SECTION    . CHOLECYSTECTOMY    . HYSTEROSCOPY N/A 08/23/2013   Procedure: HYSTEROSCOPY D&C WITH HYDROTHERMAL ABLATION;  Surgeon: Osborne Oman, MD;  Location: [facility];  Service: Gynecology;  Laterality: N/A;  . TUBAL LIGATION       Allergies  No Known Allergies  History of Present Illness    Kelly Novak is a 45 year old female with past medical history of hypertension, obesity, bipolar, anxiety, GERD, and sickle cell trait. Appears that she was seen once in the office in 2016 by Dr. Acie Fredrickson her she was referred for follow-up of her hypertension. At that time had noted to run out of her medication for the previous months. At that time her medications were refilled, and she was encouraged to follow-up with her PCP for further management.   She reports losing her Medicaid coverage back in October 2016. States she has not had her medications on a regular basis since that time, and has been filling her clonidine when she can given it listed cheapest of her medications. Also reports being under increased stress  over the past couple months in her personal life related to recent loss of her sons.  She presented to the ER with reports of headache, generalized weakness and chest pain for 3 days prior to admission. In the ER her blood pressure was noted to be 222/138, enzymes were significant for potassium of 3.4, negative POC troponin, stable hemoglobin and chest x-ray negative. EKG showed sinus rhythm with no acute ST/T-wave abnormalities. She was admitted and resumed on her home medications by internal medicine. Of note her urine drug screen was positive for benzos, opiates, cocaine and marijuana. Her troponins were cycled and with mild elevation. Her blood pressure improved once resuming her home medications.  Inpatient Medications    . aspirin EC  81 mg Oral Daily  . carvedilol  25 mg Oral BID WC  . cloNIDine  0.1 mg Oral QHS  . enoxaparin (LOVENOX) injection  40 mg Subcutaneous Q24H  . hydrALAZINE  100 mg Oral Q8H  . hydrochlorothiazide  50 mg Oral Daily  . lisinopril  10 mg Oral Daily  . nicotine  21 mg Transdermal Q24H  . pantoprazole  40 mg Oral Daily  . PARoxetine  20 mg Oral Daily  . sodium chloride flush  3 mL Intravenous Q12H    Family History    Family History  Problem Relation Age of Onset  . CAD Father   . Heart failure Brother   . Hypertension Brother   .  CAD Paternal Aunt     Social History    Social History   Social History  . Marital status: Widowed    Spouse name: N/A  . Number of children: N/A  . Years of education: N/A   Occupational History  . Not on file.   Social History Main Topics  . Smoking status: Current Every Day Smoker    Packs/day: 0.00    Years: 14.00    Types: Cigarettes  . Smokeless tobacco: Never Used  . Alcohol use No  . Drug use: No  . Sexual activity: No   Other Topics Concern  . Not on file   Social History Narrative  . No narrative on file     Review of Systems    General:  No chills, fever, night sweats or weight changes.    Cardiovascular:  See HPI Dermatological: No rash, lesions/masses Respiratory: No cough, dyspnea Urologic: No hematuria, dysuria Abdominal:   No nausea, vomiting, diarrhea, bright red blood per rectum, melena, or hematemesis Neurologic:  No visual changes, + wkns, changes in mental status. All other systems reviewed and are otherwise negative except as noted above.  Physical Exam    Blood pressure (!) 159/85, pulse 79, temperature 98.2 F (36.8 C), temperature source Oral, resp. rate 17, height 5\' 7"  (1.702 m), weight (!) 316 lb 9.6 oz (143.6 kg), SpO2 100 %.  General: Pleasant Obese female, NAD Psych: Normal affect. Neuro: Alert and oriented X 3. Moves all extremities spontaneously. HEENT: Normal  Neck: Supple without bruits or JVD. Lungs:  Resp regular and unlabored, CTA. Heart: RRR + s3, s4, or murmurs. Abdomen: Soft, obese, non-tender, non-distended, BS + x 4.  Extremities: No clubbing, cyanosis or edema. DP/PT/Radials 2+ and equal bilaterally.  Labs    Troponin [facility] of Care Test)  Recent Labs  11/03/15 1752  TROPIPOC 0.01    Recent Labs  11/03/15 2309 11/04/15 0547 11/04/15 1113  TROPONINI 0.03* 0.03* 0.06*   Lab Results  Component Value Date   WBC 9.2 11/04/2015   HGB 13.2 11/04/2015   HCT 42.1 11/04/2015   MCV 62.9 (L) 11/04/2015   PLT 344 11/04/2015    Recent Labs Lab 11/05/15 0420  NA 136  K 3.6  CL 100*  CO2 26  BUN 11  CREATININE 0.91  CALCIUM 9.0  PROT 7.0  BILITOT 1.0  ALKPHOS 83  ALT 25  AST 23  GLUCOSE 103*   Lab Results  Component Value Date   CHOL 196 11/04/2015   HDL 47 11/04/2015   LDLCALC 99 11/04/2015   TRIG 249 (H) 11/04/2015   Lab Results  Component Value Date   DDIMER 0.40 12/15/2013     Radiology Studies    Dg Chest 2 View  Result Date: 11/03/2015 CLINICAL DATA:  Chest pain.  Cough and congestion. EXAM: CHEST  2 VIEW COMPARISON:  March 15, 2015 FINDINGS: The heart size and mediastinal contours are within  normal limits. Both lungs are clear. The visualized skeletal structures are unremarkable. IMPRESSION: No active cardiopulmonary disease. Electronically Signed   By: Dorise Bullion III M.D   On: 11/03/2015 18:01   Ct Head Wo Contrast  Result Date: 11/03/2015 CLINICAL DATA:  Headache, onset yesterday. Nausea today. Hypertension. EXAM: CT HEAD WITHOUT CONTRAST TECHNIQUE: Contiguous axial images were obtained from the base of the skull through the vertex without intravenous contrast. COMPARISON:  06/06/2013 FINDINGS: Brain: There is no intracranial hemorrhage, mass or evidence of acute infarction. There is mild generalized atrophy.  There is mild chronic microvascular ischemic change. 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 There is no significant extra-axial fluid collection. No acute intracranial findings are evident. Vascular: No hyperdense vessel or unexpected calcification. Skull: The calvarium and skullbase are intact. Sinuses/Orbits: Visible paranasal sinuses and orbits are unremarkable. Other: IMPRESSION: No acute intracranial findings. Remote right pontine infarction. There is mild generalized atrophy and chronic appearing white matter hypodensities which likely represent small vessel ischemic disease. Electronically Signed   By: Andreas Newport M.D.   On: 11/03/2015 21:44   Ct Abdomen Pelvis W Contrast  Result Date: 11/04/2015 CLINICAL DATA:  Abdominal pain, nausea and vomiting, onset at 04:00 yesterday EXAM: CT ABDOMEN AND PELVIS WITH CONTRAST TECHNIQUE: Multidetector CT imaging of the abdomen and pelvis was performed using the standard protocol following bolus administration of intravenous contrast. CONTRAST:  131mL ISOVUE-300 IOPAMIDOL (ISOVUE-300) INJECTION 61% COMPARISON:  12/09/2011 FINDINGS: Lower chest: No significant abnormality Hepatobiliary: Mild diffuse fatty infiltration of the liver. Cholecystectomy. Normal bile ducts. Pancreas: Normal Spleen: Normal Adrenals/Urinary Tract: The  adrenals and kidneys are normal in appearance. There is no urinary calculus evident. There is no hydronephrosis or ureteral dilatation. Collecting systems and ureters appear unremarkable. Stomach/Bowel: There are normal appearances of the stomach, small bowel and colon. The appendix is normal. Vascular/Lymphatic: The abdominal aorta is normal in caliber. There is mild atherosclerotic calcification. There is no adenopathy in the abdomen or pelvis. Reproductive: Uterus and adnexal structures are unremarkable. Other: No acute inflammatory changes are evident in the abdomen or pelvis. There is no ascites. Small fat containing umbilical hernia incidentally noted. Musculoskeletal: No significant skeletal lesions. IMPRESSION: Fatty infiltration of the liver. Small fat containing umbilical hernia. No acute findings are evident in the abdomen or pelvis. Electronically Signed   By: Andreas Newport M.D.   On: 11/04/2015 02:17    ECG & Cardiac Imaging    EKG: SR with no acute ST/T wave changes  Echo: Pending  Assessment & Plan    45 year old female with past medical history of hypertension, obesity, bipolar, anxiety, GERD, and sickle cell trait who presented to the [facility] ER with reports of headache and chest pain.  1. Chest pain: Resolved once her blood pressure improved. Did have mild trop bump likely related to demand ischemia from uncontrolled HTN. EKG shows SR with no acute ST/T wave changes. Denies any anginal symptoms prior to this admission. Has been seen by case management and working to regain her medicaid status.   2. HTN: Improved, along with symptoms once resumed on home medications. She has been given resources via case management to help with cost of meds.   3. Tobacco abuse: Currently using a nicotine patch, states she has a plan to quit  4. Polysubstance abuse: Reports using both marijuana and cocaine prior this admission. States she used to help her relax, currently being seen at  [facility] for her Bipolar and anxiety. States she planned to quit using illegal drugs.   Barnet Pall, NP-C Pager [phone] 11/05/2015, 4:08 PM   Agree with note by Reino Bellis NP-C  Major issue is medication compliance and cost. BP better in hosp when she is on her meds. No other Sx. Exam benign. Labs OK. 2 D pending, Not concerned about mild Trop elevation. Doubt ischemia.  Will see again as needed. Needs CRF modif, tob cessation.   Lorretta Harp, M.D., [place], [facility], [place] [address] [place], [place]  [address]  [phone] 11/05/2015  4:50 PM

## 2015-11-06 NOTE — Care Management Note (Signed)
Case Management Note Marvetta Gibbons RN, BSN Unit 2W-Case Manager [phone]  Patient Details  Name: KINZIE GIGER MRN: [id number] Date of Birth: November 02, 1970  Subjective/Objective:   Pt admitted with  HTN                 Action/Plan: PTA pt lived at home with family- referral for medication needs and PCP- spoke with pt at bedside with daughter present- per conversation pt states that she used to go to Dr. Vista Lawman when she had medicaid but has not see PCP since she lost Medicaid- she now has no insurance- per pt she has an upcoming appointment with the [facility] at [facility] on Oct. 6 at 2pm- pt uses- Product/process development scientist on [place] states that she can pay $3 copay for meds- is eligible for MATCH if needed to assist with medications at discharge- CM will review d/c meds to assess need for [facility] prior to discharge.   Expected Discharge Date:    11/05/15              Expected Discharge Plan:  Home/Self Care  In-House Referral:     Discharge planning Services  CM Consult, Medication Assistance  Post Acute Care Choice:    Choice offered to:     DME Arranged:    DME Agency:     HH Arranged:    HH Agency:     Status of Service:  Completed, signed off  If discussed at [employer] of Stay Meetings, dates discussed:    Additional Comments  11/05/15-CM provided pt most recent updated copy of [facility]'s $4 medication list- and highlighted pt's current meds that are on the list- CM to continue to follow for any further Needs  Dahlia Client Romeo Rabon, RN 11/06/2015, 8:05 AM

## 2015-11-07 LAB — ECHOCARDIOGRAM COMPLETE
HEIGHTINCHES: 67 in
WEIGHTICAEL: 5065.6 [oz_av]

## 2015-11-29 ENCOUNTER — Ambulatory Visit: Payer: Self-pay | Admitting: Family Medicine

## 2015-12-07 ENCOUNTER — Encounter (HOSPITAL_COMMUNITY): Payer: Self-pay

## 2015-12-07 ENCOUNTER — Ambulatory Visit (HOSPITAL_COMMUNITY)
Admission: EM | Admit: 2015-12-07 | Discharge: 2015-12-07 | Disposition: A | Discharge status: Home / Self Care | Payer: Self-pay | Attending: Internal Medicine | Admitting: Internal Medicine

## 2015-12-07 ENCOUNTER — Ambulatory Visit (INDEPENDENT_AMBULATORY_CARE_PROVIDER_SITE_OTHER): Payer: Self-pay

## 2015-12-07 DIAGNOSIS — S60221A Contusion of right hand, initial encounter: Secondary | ICD-10-CM

## 2015-12-07 MED ORDER — KETOROLAC TROMETHAMINE 60 MG/2ML IM SOLN
60.0000 mg | Freq: Once | INTRAMUSCULAR | Status: AC
  Administered 2015-12-07: 60 mg via INTRAMUSCULAR

## 2015-12-07 MED ORDER — TRAMADOL HCL 50 MG PO TABS: 50.0000 mg | ORAL_TABLET | Freq: Four times a day (QID) | ORAL | 0 refills | Status: DC | PRN

## 2015-12-07 MED ORDER — KETOROLAC TROMETHAMINE 60 MG/2ML IM SOLN
INTRAMUSCULAR | Status: AC
  Filled 2015-12-07: qty 2
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
## 2015-12-25 ENCOUNTER — Encounter (HOSPITAL_COMMUNITY): Payer: Self-pay | Admitting: Emergency Medicine

## 2015-12-25 ENCOUNTER — Emergency Department (HOSPITAL_COMMUNITY)
Admission: EM | Admit: 2015-12-25 | Discharge: 2015-12-25 | Disposition: A | Discharge status: Home / Self Care | Payer: Medicaid Other | Attending: Emergency Medicine | Admitting: Emergency Medicine

## 2015-12-25 ENCOUNTER — Emergency Department (HOSPITAL_COMMUNITY): Payer: Medicaid Other

## 2015-12-25 DIAGNOSIS — K0889 Other specified disorders of teeth and supporting structures: Secondary | ICD-10-CM | POA: Diagnosis not present

## 2015-12-25 DIAGNOSIS — Z79899 Other long term (current) drug therapy: Secondary | ICD-10-CM | POA: Diagnosis not present

## 2015-12-25 DIAGNOSIS — I10 Essential (primary) hypertension: Secondary | ICD-10-CM | POA: Insufficient documentation

## 2015-12-25 DIAGNOSIS — Z8673 Personal history of transient ischemic attack (TIA), and cerebral infarction without residual deficits: Secondary | ICD-10-CM | POA: Insufficient documentation

## 2015-12-25 DIAGNOSIS — F1510 Other stimulant abuse, uncomplicated: Secondary | ICD-10-CM | POA: Diagnosis present

## 2015-12-25 DIAGNOSIS — Z87891 Personal history of nicotine dependence: Secondary | ICD-10-CM | POA: Diagnosis not present

## 2015-12-25 DIAGNOSIS — Z7982 Long term (current) use of aspirin: Secondary | ICD-10-CM | POA: Diagnosis not present

## 2015-12-25 DIAGNOSIS — R51 Headache: Secondary | ICD-10-CM | POA: Insufficient documentation

## 2015-12-25 DIAGNOSIS — F1110 Opioid abuse, uncomplicated: Secondary | ICD-10-CM | POA: Diagnosis not present

## 2015-12-25 DIAGNOSIS — F1910 Other psychoactive substance abuse, uncomplicated: Secondary | ICD-10-CM | POA: Diagnosis not present

## 2015-12-25 HISTORY — DX: Cerebral infarction, unspecified: I63.9

## 2015-12-25 LAB — URINALYSIS, ROUTINE W REFLEX MICROSCOPIC
BILIRUBIN URINE: NEGATIVE
GLUCOSE, UA: NEGATIVE mg/dL
HGB URINE DIPSTICK: NEGATIVE
KETONES UR: NEGATIVE mg/dL
Leukocytes, UA: NEGATIVE
Nitrite: NEGATIVE
PH: 6.5 (ref 5.0–8.0)
Protein, ur: 30 mg/dL — AB
SPECIFIC GRAVITY, URINE: 1.031 — AB (ref 1.005–1.030)

## 2015-12-25 LAB — BASIC METABOLIC PANEL
ANION GAP: 6 (ref 5–15)
BUN: 11 mg/dL (ref 6–20)
CO2: 25 mmol/L (ref 22–32)
Calcium: 8.4 mg/dL — ABNORMAL LOW (ref 8.9–10.3)
Chloride: 107 mmol/L (ref 101–111)
Creatinine, Ser: 0.81 mg/dL (ref 0.44–1.00)
GFR calc Af Amer: >60 mL/min (ref >60)
GFR calc non Af Amer: >60 mL/min (ref >60)
GLUCOSE: 101 mg/dL — AB (ref 65–99)
POTASSIUM: 3.9 mmol/L (ref 3.5–5.1)
Sodium: 138 mmol/L (ref 135–145)

## 2015-12-25 LAB — CBC
HEMATOCRIT: 32.2 % — AB (ref 36.0–46.0)
HEMOGLOBIN: 9.9 g/dL — AB (ref 12.0–15.0)
MCH: 19 pg — AB (ref 26.0–34.0)
MCHC: 30.7 g/dL (ref 30.0–36.0)
MCV: 61.7 fL — ABNORMAL LOW (ref 78.0–100.0)
Platelets: 314 10*3/uL (ref 150–400)
RBC: 5.22 MIL/uL — ABNORMAL HIGH (ref 3.87–5.11)
RDW: 17.1 % — AB (ref 11.5–15.5)
WBC: 8.7 10*3/uL (ref 4.0–10.5)

## 2015-12-25 LAB — URINE MICROSCOPIC-ADD ON

## 2015-12-25 LAB — RAPID URINE DRUG SCREEN, HOSP PERFORMED
Amphetamines: POSITIVE — AB
BARBITURATES: NOT DETECTED
Benzodiazepines: POSITIVE — AB
Cocaine: NOT DETECTED
Opiates: POSITIVE — AB
TETRAHYDROCANNABINOL: POSITIVE — AB

## 2015-12-25 MED ORDER — KETOROLAC TROMETHAMINE 30 MG/ML IJ SOLN
30.0000 mg | Freq: Once | INTRAMUSCULAR | Status: AC
  Administered 2015-12-25: 30 mg via INTRAVENOUS
  Filled 2015-12-25: qty 1

## 2015-12-25 MED ORDER — IBUPROFEN 800 MG PO TABS: 800.0000 mg | ORAL_TABLET | Freq: Three times a day (TID) | ORAL | 0 refills | Status: DC | PRN

## 2015-12-25 MED ORDER — SODIUM CHLORIDE 0.9 % IV BOLUS (SEPSIS)
500.0000 mL | Freq: Once | INTRAVENOUS | Status: AC
  Administered 2015-12-25: 500 mL via INTRAVENOUS

## 2015-12-25 MED ORDER — PENICILLIN V POTASSIUM 500 MG PO TABS: 500.0000 mg | ORAL_TABLET | Freq: Four times a day (QID) | ORAL | 0 refills | Status: AC

## 2015-12-25 NOTE — ED Provider Notes (Signed)
[place] DEPT Provider Note   CSN: [id number] Arrival date & time: 12/25/15  1918     History   Chief Complaint Chief Complaint  Patient presents with  . Fatigue    HPI Jailey E Yoss is a 45 y.o. female.  Patient was sent over here for evaluation because she was sleepy. Her roommates and drove her here. Patient states that she gets sleepy when she takes her blood pressure medicine and she took her blood pressure medicine and 5 today she also complains of a toothache    Altered Mental Status   This is a recurrent problem. The current episode started 6 to 12 hours ago. The problem has not changed since onset.Pertinent negatives include no confusion, no seizures and no hallucinations. Risk factors include illicit drug use (Substance abuse). Her past medical history does not include seizures.    Past Medical History:  Diagnosis Date  . Anemia   . Anxiety   . Arthritis   . Bipolar 1 disorder ([place])   . Blood dyscrasia    sickle cell trait  . Chronic back pain   . GERD (gastroesophageal reflux disease)   . Hypertension   . Obesity   . Shortness of breath    on exertion due to weight  . Stroke [facility])     Patient Active Problem List   Diagnosis Date Noted  . Depression 11/03/2015  . GERD (gastroesophageal reflux disease) 11/03/2015  . Hypokalemia 11/03/2015  . Abdominal pain 11/03/2015  . Chronic back pain   . Abnormal uterine bleeding 08/10/2013  . Uterine fibroids 08/10/2013  . ASCUS pap with negative HRHPV 08/10/2013  . Hypertensive emergency 06/06/2013  . Chest pain 06/06/2013  . Hypertensive urgency 06/06/2013  . Pes planus (flat feet) 09/02/2012  . Anemia 09/02/2012  . Foot pain 09/02/2012  . Smoker 09/02/2012    Past Surgical History:  Procedure Laterality Date  . CESAREAN SECTION    . CHOLECYSTECTOMY    . HYSTEROSCOPY N/A 08/23/2013   Procedure: HYSTEROSCOPY D&C WITH HYDROTHERMAL ABLATION;  Surgeon: Osborne Oman, MD;  Location: [facility];   Service: Gynecology;  Laterality: N/A;  . TUBAL LIGATION      OB History    Gravida Para Term Preterm AB Living   4 4 4     2    SAB TAB Ectopic Multiple Live Births           4       Home Medications    Prior to Admission medications   Medication Sig Start Date End Date Taking? Authorizing Provider  carvedilol (COREG) 25 MG tablet Take 1 tablet (25 mg total) by mouth 2 (two) times daily with a meal. 11/05/15  Yes Florencia Reasons, MD  cloNIDine (CATAPRES) 0.1 MG tablet Take 3 tablets (0.3 mg total) by mouth at bedtime. 11/05/15  Yes Florencia Reasons, MD  hydrALAZINE (APRESOLINE) 100 MG tablet Take 1 tablet (100 mg total) by mouth every 8 (eight) hours. 11/05/15  Yes Florencia Reasons, MD  hydrochlorothiazide (HYDRODIURIL) 50 MG tablet Take 1 tablet (50 mg total) by mouth daily. 11/05/15  Yes Florencia Reasons, MD  lisinopril (PRINIVIL,ZESTRIL) 10 MG tablet Take 1 tablet (10 mg total) by mouth daily. 11/05/15  Yes Florencia Reasons, MD  nitroGLYCERIN (NITROSTAT) 0.4 MG SL tablet Place 1 tablet (0.4 mg total) under the tongue every 5 (five) minutes as needed for chest pain. 11/05/15  Yes Florencia Reasons, MD  omeprazole (PRILOSEC) 20 MG capsule Take 20 mg by mouth 3 (three)  times daily before meals. Reported on 08/24/2015   Yes Historical Provider, MD  PARoxetine (PAXIL) 20 MG tablet Take 1 tablet (20 mg total) by mouth daily. 03/16/15  Yes John Molpus, MD  traMADol (ULTRAM) 50 MG tablet Take 1 tablet (50 mg total) by mouth every 6 (six) hours as needed. Patient taking differently: Take 50 mg by mouth every 6 (six) hours as needed for moderate pain.  12/07/15  Yes Jacqualine Mau, NP  aspirin EC 81 MG EC tablet Take 1 tablet (81 mg total) by mouth daily. Patient not taking: Reported on 12/25/2015 11/05/15   Florencia Reasons, MD  ibuprofen (ADVIL,MOTRIN) 800 MG tablet Take 1 tablet (800 mg total) by mouth every 8 (eight) hours as needed for moderate pain. 12/25/15   Milton Ferguson, MD  penicillin v potassium (VEETID) 500 MG tablet Take 1 tablet (500 mg total) by  mouth 4 (four) times daily. 12/25/15 01/01/16  Milton Ferguson, MD    Family History Family History  Problem Relation Age of Onset  . CAD Father   . Heart failure Brother   . Hypertension Brother   . CAD Paternal Aunt     Social History Social History  Substance Use Topics  . Smoking status: Former Smoker    Packs/day: 0.00    Years: 14.00    Types: Cigarettes  . Smokeless tobacco: Never Used  . Alcohol use No     Allergies   Review of patient's allergies indicates no known allergies.   Review of Systems Review of Systems  Constitutional: Negative for appetite change and fatigue.  HENT: Negative for congestion, ear discharge and sinus pressure.        Toothache  Eyes: Negative for discharge.  Respiratory: Negative for cough.   Cardiovascular: Negative for chest pain.  Gastrointestinal: Negative for abdominal pain and diarrhea.  Genitourinary: Negative for frequency and hematuria.  Musculoskeletal: Negative for back pain.  Skin: Negative for rash.  Neurological: Negative for seizures and headaches.  Psychiatric/Behavioral: Negative for confusion and hallucinations.     Physical Exam Updated Vital Signs BP 157/95   Pulse 70   Temp 98 F (36.7 C) (Oral)   Resp 23   Ht 5\' 8"  (1.727 m)   Wt 300 lb (136.1 kg)   LMP 08/06/2013   SpO2 99%   BMI 45.61 kg/m   Physical Exam  Constitutional: She is oriented to person, place, and time. She appears well-developed.  HENT:  Head: Normocephalic.  Patient has tenderness to her left lower molar  Eyes: Conjunctivae and EOM are normal. No scleral icterus.  Neck: Neck supple. No thyromegaly present.  Cardiovascular: Normal rate and regular rhythm.  Exam reveals no gallop and no friction rub.   No murmur heard. Pulmonary/Chest: No stridor. She has no wheezes. She has no rales. She exhibits no tenderness.  Abdominal: She exhibits no distension. There is no tenderness. There is no rebound.  Musculoskeletal: Normal range of  motion. She exhibits no edema.  Lymphadenopathy:    She has no cervical adenopathy.  Neurological: She is oriented to person, place, and time. She exhibits normal muscle tone. Coordination normal.  Patient is mildly lethargic  Skin: No rash noted. No erythema.  Psychiatric: She has a normal mood and affect. Her behavior is normal.     ED Treatments / Results  Labs (all labs ordered are listed, but only abnormal results are displayed) Labs Reviewed  BASIC METABOLIC PANEL - Abnormal; Notable for the following:  Result Value   Glucose, Bld 101 (*)    Calcium 8.4 (*)    All other components within normal limits  CBC - Abnormal; Notable for the following:    RBC 5.22 (*)    Hemoglobin 9.9 (*)    HCT 32.2 (*)    MCV 61.7 (*)    MCH 19.0 (*)    RDW 17.1 (*)    All other components within normal limits  URINALYSIS, ROUTINE W REFLEX MICROSCOPIC (NOT AT [facility]) - Abnormal; Notable for the following:    Color, Urine AMBER (*)    APPearance CLOUDY (*)    Specific Gravity, Urine 1.031 (*)    Protein, ur 30 (*)    All other components within normal limits  RAPID URINE DRUG SCREEN, HOSP PERFORMED - Abnormal; Notable for the following:    Opiates POSITIVE (*)    Benzodiazepines POSITIVE (*)    Amphetamines POSITIVE (*)    Tetrahydrocannabinol POSITIVE (*)    All other components within normal limits  URINE MICROSCOPIC-ADD ON - Abnormal; Notable for the following:    Squamous Epithelial / LPF 0-5 (*)    Bacteria, UA RARE (*)    All other components within normal limits  CBG MONITORING, ED    EKG  EKG Interpretation None       Radiology Ct Head Wo Contrast  Result Date: 12/25/2015 CLINICAL DATA:  Headache beginning this morning. Hypertension. Previous TIAs. EXAM: CT HEAD WITHOUT CONTRAST TECHNIQUE: Contiguous axial images were obtained from the base of the skull through the vertex without intravenous contrast. COMPARISON:  11/03/2015 FINDINGS: Brain: No evidence of acute  infarction, hemorrhage, hydrocephalus, extra-axial collection or mass lesion/mass effect. Old lacunar infarct again seen involving the right pons. Vascular: No hyperdense vessel or unexpected calcification. Skull: No evidence of fracture or other acute findings. Sinuses/Orbits: No acute finding. Mucosal thickening again seen involving the ethmoid, maxillary and sphenoid sinuses, consistent chronic sinusitis. Other: None. IMPRESSION: No acute intracranial abnormality. 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 Electronically Signed   By: Earle Gell M.D.   On: 12/25/2015 20:30    Procedures Procedures (including critical care time)  Medications Ordered in ED Medications  ketorolac (TORADOL) 30 MG/ML injection 30 mg (not administered)  sodium chloride 0.9 % bolus 500 mL (500 mLs Intravenous New Bag/Given 12/25/15 2032)     Initial Impression / Assessment and Plan / ED Course  I have reviewed the triage vital signs and the nursing notes.  Pertinent labs & imaging results that were available during my care of the patient were reviewed by me and considered in my medical decision making (see chart for details).  Clinical Course    Labs show patient to be abusing narcotics and illicit drugs. Patient is slightly anemic also. She is instructed to stop taking drugs that are not prescribed by her doctor. She is put on Motrin and penicillin for her abscessed tooth and told to follow-up with a dentist  Final Clinical Impressions(s) / ED Diagnoses   Final diagnoses:  Substance abuse  Toothache    New Prescriptions New Prescriptions   IBUPROFEN (ADVIL,MOTRIN) 800 MG TABLET    Take 1 tablet (800 mg total) by mouth every 8 (eight) hours as needed for moderate pain.   PENICILLIN V POTASSIUM (VEETID) 500 MG TABLET    Take 1 tablet (500 mg total) by mouth 4 (four) times daily.     Milton Ferguson, MD 12/25/15 2140

## 2015-12-25 NOTE — ED Notes (Signed)
Doctor at bedside.

## 2015-12-25 NOTE — ED Notes (Signed)
Pt medicated for pain, awaiting family to arrive for d/c

## 2015-12-25 NOTE — ED Triage Notes (Addendum)
Arrived via EMS roommate called because patient sleeping all day.  EMS report patient sleeps when not talked to. Awakes by voice alert answering and following commands when awake. CBG 130 EKG NSR. Patient states took 3 tabs of Clonidine at 1700 for blood pressure.

## 2015-12-25 NOTE — ED Notes (Signed)
Pt to CT via stretcher

## 2015-12-25 NOTE — ED Notes (Signed)
Pt counseled re inappropriate usage of her meds and illegal substances.

## 2015-12-25 NOTE — ED Notes (Signed)
Pt states she normally takes her Clonidine x 3 and Hydralazine before bed. Pt denies SI but is depressed d/t thoughts of son's birthday who has passed. Pt initially difficult to arouse but is A & O x 4 when awake. Pt unsure why her room mate called 911 and feels she is no more drowsy tonight than normally.

## 2015-12-25 NOTE — Discharge Instructions (Signed)
Follow-up with her dizziness. Tooth. Do not take any medicines or drugs that are not prescribed to you by your doctor

## 2016-08-06 ENCOUNTER — Inpatient Hospital Stay (HOSPITAL_COMMUNITY)
Admission: EM | Admit: 2016-08-06 | Discharge: 2016-08-09 | DRG: 305 | Disposition: A | Discharge status: Home / Self Care | Payer: Medicare Other | Attending: Family Medicine | Admitting: Family Medicine

## 2016-08-06 ENCOUNTER — Encounter (HOSPITAL_COMMUNITY): Payer: Self-pay | Admitting: Emergency Medicine

## 2016-08-06 DIAGNOSIS — Z7982 Long term (current) use of aspirin: Secondary | ICD-10-CM

## 2016-08-06 DIAGNOSIS — Z9114 Patient's other noncompliance with medication regimen: Secondary | ICD-10-CM

## 2016-08-06 DIAGNOSIS — I110 Hypertensive heart disease with heart failure: Secondary | ICD-10-CM | POA: Diagnosis present

## 2016-08-06 DIAGNOSIS — I248 Other forms of acute ischemic heart disease: Secondary | ICD-10-CM | POA: Diagnosis present

## 2016-08-06 DIAGNOSIS — Z87891 Personal history of nicotine dependence: Secondary | ICD-10-CM

## 2016-08-06 DIAGNOSIS — D573 Sickle-cell trait: Secondary | ICD-10-CM | POA: Diagnosis present

## 2016-08-06 DIAGNOSIS — I160 Hypertensive urgency: Secondary | ICD-10-CM | POA: Diagnosis not present

## 2016-08-06 DIAGNOSIS — R51 Headache: Secondary | ICD-10-CM | POA: Diagnosis not present

## 2016-08-06 DIAGNOSIS — I5022 Chronic systolic (congestive) heart failure: Secondary | ICD-10-CM | POA: Diagnosis present

## 2016-08-06 DIAGNOSIS — M549 Dorsalgia, unspecified: Secondary | ICD-10-CM | POA: Diagnosis present

## 2016-08-06 DIAGNOSIS — Z8249 Family history of ischemic heart disease and other diseases of the circulatory system: Secondary | ICD-10-CM

## 2016-08-06 DIAGNOSIS — F32A Depression, unspecified: Secondary | ICD-10-CM | POA: Diagnosis present

## 2016-08-06 DIAGNOSIS — Z8673 Personal history of transient ischemic attack (TIA), and cerebral infarction without residual deficits: Secondary | ICD-10-CM

## 2016-08-06 DIAGNOSIS — G8929 Other chronic pain: Secondary | ICD-10-CM | POA: Diagnosis present

## 2016-08-06 DIAGNOSIS — R109 Unspecified abdominal pain: Secondary | ICD-10-CM | POA: Diagnosis present

## 2016-08-06 DIAGNOSIS — R079 Chest pain, unspecified: Secondary | ICD-10-CM | POA: Diagnosis present

## 2016-08-06 DIAGNOSIS — F419 Anxiety disorder, unspecified: Secondary | ICD-10-CM | POA: Diagnosis present

## 2016-08-06 DIAGNOSIS — F329 Major depressive disorder, single episode, unspecified: Secondary | ICD-10-CM | POA: Diagnosis present

## 2016-08-06 DIAGNOSIS — F319 Bipolar disorder, unspecified: Secondary | ICD-10-CM | POA: Diagnosis present

## 2016-08-06 DIAGNOSIS — F1910 Other psychoactive substance abuse, uncomplicated: Secondary | ICD-10-CM

## 2016-08-06 DIAGNOSIS — K219 Gastro-esophageal reflux disease without esophagitis: Secondary | ICD-10-CM | POA: Diagnosis present

## 2016-08-06 DIAGNOSIS — Z79899 Other long term (current) drug therapy: Secondary | ICD-10-CM

## 2016-08-06 DIAGNOSIS — Z6841 Body Mass Index (BMI) 40.0 and over, adult: Secondary | ICD-10-CM

## 2016-08-06 HISTORY — DX: Other psychoactive substance abuse, uncomplicated: F19.10

## 2016-08-06 HISTORY — DX: Chronic systolic (congestive) heart failure: I50.22

## 2016-08-06 LAB — DIFFERENTIAL
Basophils Absolute: 0 10*3/uL (ref 0.0–0.1)
Basophils Relative: 0 %
Eosinophils Absolute: 0.3 10*3/uL (ref 0.0–0.7)
Eosinophils Relative: 3 %
Lymphocytes Relative: 33 %
Lymphs Abs: 3 10*3/uL (ref 0.7–4.0)
Monocytes Absolute: 0.5 10*3/uL (ref 0.1–1.0)
Monocytes Relative: 5 %
Neutro Abs: 5.2 10*3/uL (ref 1.7–7.7)
Neutrophils Relative %: 59 %

## 2016-08-06 LAB — COMPREHENSIVE METABOLIC PANEL
ALT: 14 U/L (ref 14–54)
AST: 18 U/L (ref 15–41)
Albumin: 3.6 g/dL (ref 3.5–5.0)
Alkaline Phosphatase: 101 U/L (ref 38–126)
Anion gap: 8 (ref 5–15)
BUN: 8 mg/dL (ref 6–20)
CO2: 23 mmol/L (ref 22–32)
Calcium: 8.5 mg/dL — ABNORMAL LOW (ref 8.9–10.3)
Chloride: 108 mmol/L (ref 101–111)
Creatinine, Ser: 0.91 mg/dL (ref 0.44–1.00)
GFR calc Af Amer: >60 mL/min (ref >60)
GFR calc non Af Amer: >60 mL/min (ref >60)
Glucose, Bld: 112 mg/dL — ABNORMAL HIGH (ref 65–99)
Potassium: 3.5 mmol/L (ref 3.5–5.1)
Sodium: 139 mmol/L (ref 135–145)
Total Bilirubin: 0.8 mg/dL (ref 0.3–1.2)
Total Protein: 6.6 g/dL (ref 6.5–8.1)

## 2016-08-06 LAB — CBC
HCT: 37.3 % (ref 36.0–46.0)
Hemoglobin: 11.5 g/dL — ABNORMAL LOW (ref 12.0–15.0)
MCH: 19.5 pg — ABNORMAL LOW (ref 26.0–34.0)
MCHC: 30.8 g/dL (ref 30.0–36.0)
MCV: 63.1 fL — ABNORMAL LOW (ref 78.0–100.0)
Platelets: 321 10*3/uL (ref 150–400)
RBC: 5.91 MIL/uL — ABNORMAL HIGH (ref 3.87–5.11)
RDW: 17.6 % — ABNORMAL HIGH (ref 11.5–15.5)
WBC: 9 10*3/uL (ref 4.0–10.5)

## 2016-08-06 LAB — I-STAT TROPONIN, ED: Troponin i, poc: 0.01 ng/mL (ref 0.00–0.08)

## 2016-08-06 MED ORDER — CARVEDILOL 25 MG PO TABS
25.0000 mg | ORAL_TABLET | Freq: Two times a day (BID) | ORAL | Status: DC
  Administered 2016-08-07: 25 mg via ORAL
  Filled 2016-08-06: qty 1

## 2016-08-06 MED ORDER — HYDROCHLOROTHIAZIDE 25 MG PO TABS
50.0000 mg | ORAL_TABLET | Freq: Every day | ORAL | Status: DC
  Administered 2016-08-07 – 2016-08-09 (×3): 50 mg via ORAL
  Filled 2016-08-06 (×5): qty 2

## 2016-08-06 MED ORDER — SODIUM CHLORIDE 0.9 % IV SOLN
Freq: Once | INTRAVENOUS | Status: AC
  Administered 2016-08-07: 01:00:00 via INTRAVENOUS

## 2016-08-06 MED ORDER — CLONIDINE HCL 0.2 MG PO TABS
0.3000 mg | ORAL_TABLET | Freq: Once | ORAL | Status: AC
  Administered 2016-08-06: 0.3 mg via ORAL
  Filled 2016-08-06: qty 1

## 2016-08-06 MED ORDER — HYDRALAZINE HCL 50 MG PO TABS
100.0000 mg | ORAL_TABLET | Freq: Three times a day (TID) | ORAL | Status: DC
  Administered 2016-08-06 – 2016-08-09 (×8): 100 mg via ORAL
  Filled 2016-08-06 (×10): qty 2

## 2016-08-06 NOTE — ED Notes (Signed)
Pt also endorses HA 

## 2016-08-06 NOTE — ED Provider Notes (Signed)
[place] DEPT Provider Note   CSN: [id number] Arrival date & time: 08/06/16  2149     History   Chief Complaint Chief Complaint  Patient presents with  . Hypertension    HPI Kelly Novak is a 46 y.o. female.  This a morbidly obese African American female with a history of poorly controlled hypertension.  Medication noncompliance who states that she is out of her medicine for one day and has no refills.  He is now complaining headache, visual disturbance, nausea, chest pain, shortness of breath.  Patient states that even know she takes a number of blood pressure medicine.  Her blood pressure medicines when she takes them is poorly controlled.      Past Medical History:  Diagnosis Date  . Anemia   . Anxiety   . Arthritis   . Bipolar 1 disorder ([place])   . Blood dyscrasia    sickle cell trait  . Chronic back pain   . GERD (gastroesophageal reflux disease)   . Hypertension   . Obesity   . Polysubstance abuse   . Shortness of breath    on exertion due to weight  . Stroke [facility])     Patient Active Problem List   Diagnosis Date Noted  . 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 Polysubstance abuse   . Depression 11/03/2015  . GERD (gastroesophageal reflux disease) 11/03/2015  . Hypokalemia 11/03/2015  . Abdominal pain 11/03/2015  . Chronic back pain   . Abnormal uterine bleeding 08/10/2013  . Uterine fibroids 08/10/2013  . ASCUS pap with negative HRHPV 08/10/2013  . Hypertensive emergency 06/06/2013  . Chest pain 06/06/2013  . Hypertensive urgency 06/06/2013  . Pes planus (flat feet) 09/02/2012  . Anemia 09/02/2012  . Foot pain 09/02/2012  . Smoker 09/02/2012    Past Surgical History:  Procedure Laterality Date  . CESAREAN SECTION    . CHOLECYSTECTOMY    . HYSTEROSCOPY N/A 08/23/2013   Procedure: HYSTEROSCOPY D&C WITH HYDROTHERMAL ABLATION;  Surgeon: Osborne Oman, MD;  Location: [facility];  Service: Gynecology;  Laterality: N/A;  . TUBAL LIGATION       OB History    Gravida Para Term Preterm AB Living   4 4 4     2    SAB TAB Ectopic Multiple Live Births           4       Home Medications    Prior to Admission medications   Medication Sig Start Date End Date Taking? Authorizing Provider  Cariprazine HCl (VRAYLAR) 4.5 MG CAPS Take 4.5 mg by mouth at bedtime.   Yes [provider]  carvedilol (COREG) 25 MG tablet Take 1 tablet (25 mg total) by mouth 2 (two) times daily with a meal. 11/05/15  Yes Florencia Reasons, MD  cloNIDine (CATAPRES) 0.3 MG tablet Take 0.3 mg by mouth 3 (three) times daily.   Yes [provider]  hydrALAZINE (APRESOLINE) 100 MG tablet Take 1 tablet (100 mg total) by mouth every 8 (eight) hours. 11/05/15  Yes Florencia Reasons, MD  hydrochlorothiazide (HYDRODIURIL) 50 MG tablet Take 1 tablet (50 mg total) by mouth daily. 11/05/15  Yes Florencia Reasons, MD  lisinopril (PRINIVIL,ZESTRIL) 10 MG tablet Take 1 tablet (10 mg total) by mouth daily. 11/05/15  Yes Florencia Reasons, MD  nitroGLYCERIN (NITROSTAT) 0.4 MG SL tablet Place 1 tablet (0.4 mg total) under the tongue every 5 (five) minutes as needed for chest pain. 11/05/15  Yes Florencia Reasons, MD  omeprazole [facility])  20 MG capsule Take 20 mg by mouth 3 (three) times daily before meals. Reported on 08/24/2015   Yes [provider]  PARoxetine (PAXIL) 20 MG tablet Take 1 tablet (20 mg total) by mouth daily. 03/16/15  Yes Molpus, John, MD  traMADol (ULTRAM) 50 MG tablet Take 1 tablet (50 mg total) by mouth every 6 (six) hours as needed. Patient taking differently: Take 50 mg by mouth every 6 (six) hours as needed for moderate pain.  12/07/15  Yes Jacqualine Mau, NP  aspirin EC 81 MG EC tablet Take 1 tablet (81 mg total) by mouth daily. Patient not taking: Reported on 08/06/2016 11/05/15   Florencia Reasons, MD  cloNIDine (CATAPRES) 0.1 MG tablet Take 3 tablets (0.3 mg total) by mouth at bedtime. Patient not taking: Reported on 08/06/2016 11/05/15   Florencia Reasons, MD  ibuprofen (ADVIL,MOTRIN) 800  MG tablet Take 1 tablet (800 mg total) by mouth every 8 (eight) hours as needed for moderate pain. Patient not taking: Reported on 08/06/2016 12/25/15   Milton Ferguson, MD    Family History Family History  Problem Relation Age of Onset  . CAD Father   . Heart failure Brother   . Hypertension Brother   . CAD Paternal Aunt     Social History Social History  Substance Use Topics  . Smoking status: Former Smoker    Packs/day: 0.00    Years: 14.00    Types: Cigarettes  . Smokeless tobacco: Never Used  . Alcohol use No     Allergies   Patient has no known allergies.   Review of Systems Review of Systems  Eyes: Positive for visual disturbance.  Respiratory: Positive for shortness of breath.   Cardiovascular: Positive for chest pain. Negative for leg swelling.  Gastrointestinal: Positive for nausea.  Neurological: Positive for headaches.  All other systems reviewed and are negative.    Physical Exam Updated Vital Signs BP (!) 201/126   Pulse 99   Temp 98.9 F (37.2 C) (Oral)   Resp 16   Ht 5\' 7"  (1.702 m)   Wt 136.1 kg (300 lb)   LMP 08/06/2013   SpO2 100%   BMI 46.99 kg/m   Physical Exam  Constitutional: She appears well-developed and well-nourished. No distress.  HENT:  Head: Normocephalic.  Eyes: Pupils are equal, round, and reactive to light.  Neck: Normal range of motion.  Cardiovascular: Normal rate.   Pulmonary/Chest: Effort normal. No respiratory distress. She exhibits no tenderness.  Abdominal: Soft.  Musculoskeletal: Normal range of motion.  Neurological: She is alert.  Skin: Skin is warm.  Psychiatric: She has a normal mood and affect.  Nursing note and vitals reviewed.    ED Treatments / Results  Labs (all labs ordered are listed, but only abnormal results are displayed) Labs Reviewed  CBC - Abnormal; Notable for the following:       Result Value   RBC 5.91 (*)    Hemoglobin 11.5 (*)    MCV 63.1 (*)    MCH 19.5 (*)    RDW 17.6 (*)     All other components within normal limits  COMPREHENSIVE METABOLIC PANEL - Abnormal; Notable for the following:    Glucose, Bld 112 (*)    Calcium 8.5 (*)    All other components within normal limits  DIFFERENTIAL  Randolm Idol, ED    EKG  EKG Interpretation  Date/Time:  Thursday August 06 2016 22:23:47 EDT Ventricular Rate:  77 PR Interval:    QRS  Duration: 83 QT Interval:  403 QTC Calculation: 457 R Axis:   40 Text Interpretation:  Sinus rhythm Borderline repolarization abnormality Borderline ST elevation, anterior leads downsloping st segemts likely from LVH, not seen on prior Otherwise no significant change Confirmed by Deno Etienne [phone]) on 08/07/2016 12:26:30 AM       Radiology Dg Chest 2 View  Result Date: 08/07/2016 CLINICAL DATA:  High blood pressure with weakness EXAM: CHEST  2 VIEW COMPARISON:  11/03/2015 FINDINGS: Minimal atelectasis at the left base. No focal consolidation or pleural effusion. Stable cardiomediastinal silhouette. No pneumothorax. IMPRESSION: No active cardiopulmonary disease. Electronically Signed   By: Donavan Foil M.D.   On: 08/07/2016 02:44   Ct Head Wo Contrast  Result Date: 08/07/2016 CLINICAL DATA:  Acute onset of high blood pressure. Initial encounter. EXAM: CT HEAD WITHOUT CONTRAST TECHNIQUE: Contiguous axial images were obtained from the base of the skull through the vertex without intravenous contrast. COMPARISON:  CT of the head performed 12/25/2015 FINDINGS: Brain: No evidence of acute infarction, hemorrhage, hydrocephalus, extra-axial collection or mass lesion/mass effect. Chronic infarcts are noted at both sides of the pons, larger on the right. The cerebellum and fourth ventricle are within normal limits. The third and lateral ventricles, and basal ganglia are unremarkable in appearance. The cerebral hemispheres are symmetric in appearance, with normal gray-white differentiation. No mass effect or midline shift is seen. Vascular: No  hyperdense vessel or unexpected calcification. Skull: There is no evidence of fracture; visualized osseous structures are unremarkable in appearance. Sinuses/Orbits: The visualized portions of the orbits are within normal limits. The paranasal sinuses and mastoid air cells are well-aerated. Other: No additional soft tissue abnormalities are seen. IMPRESSION: 1. No acute intracranial pathology seen on CT. 2. Chronic infarcts at both sides of the pons, larger on the right. Electronically Signed   By: Garald Balding M.D.   On: 08/07/2016 00:39    Procedures Procedures (including critical care time)  Medications Ordered in ED Medications  carvedilol (COREG) tablet 25 mg (not administered)  hydrALAZINE (APRESOLINE) tablet 100 mg (100 mg Oral Given 08/06/16 2339)  hydrochlorothiazide (HYDRODIURIL) tablet 50 mg (not administered)  0.9 %  sodium chloride infusion ( Intravenous New Bag/Given 08/07/16 0116)  cloNIDine (CATAPRES) tablet 0.3 mg (0.3 mg Oral Given 08/06/16 2309)  nitroGLYCERIN (NITROGLYN) 2 % ointment 1 inch (1 inch Topical Given 08/07/16 0054)  hydrALAZINE (APRESOLINE) injection 10 mg (10 mg Intravenous Given 08/07/16 0115)  morphine 4 MG/ML injection 4 mg (4 mg Intravenous Given 08/07/16 0214)     Initial Impression / Assessment and Plan / ED Course  I have reviewed the triage vital signs and the nursing notes.  Pertinent labs & imaging results that were available during my care of the patient were reviewed by me and considered in my medical decision making (see chart for details).   despite several antihypertensive agents.  Blood pressure remains very labile, chest x-ray, head CT within normal parameters.  Troponin is negative.  Patient will be admitted for continued evaluation and aggressive medical management    Final Clinical Impressions(s) / ED Diagnoses   Final diagnoses:  Polysubstance abuse  Hypertensive urgency    New Prescriptions New Prescriptions   No medications on  file     Junius Creamer, NP 08/06/16 [place]    Junius Creamer, NP 08/07/16 4403    Virgel Manifold, MD 08/14/16 1129

## 2016-08-06 NOTE — ED Triage Notes (Signed)
Pt presents C/O high BP. Checked at home and noted 282'K systolic. Pt here frequently for noncompliance with BP meds. Pt states she ran out yesterday.Marland KitchenMarland Kitchen

## 2016-08-07 ENCOUNTER — Emergency Department (HOSPITAL_COMMUNITY): Payer: Medicare Other

## 2016-08-07 ENCOUNTER — Encounter (HOSPITAL_COMMUNITY): Payer: Self-pay | Admitting: Internal Medicine

## 2016-08-07 DIAGNOSIS — Z6841 Body Mass Index (BMI) 40.0 and over, adult: Secondary | ICD-10-CM | POA: Diagnosis not present

## 2016-08-07 DIAGNOSIS — F319 Bipolar disorder, unspecified: Secondary | ICD-10-CM | POA: Diagnosis present

## 2016-08-07 DIAGNOSIS — I248 Other forms of acute ischemic heart disease: Secondary | ICD-10-CM | POA: Diagnosis present

## 2016-08-07 DIAGNOSIS — Z7982 Long term (current) use of aspirin: Secondary | ICD-10-CM | POA: Diagnosis not present

## 2016-08-07 DIAGNOSIS — Z9114 Patient's other noncompliance with medication regimen: Secondary | ICD-10-CM | POA: Diagnosis not present

## 2016-08-07 DIAGNOSIS — D573 Sickle-cell trait: Secondary | ICD-10-CM | POA: Diagnosis present

## 2016-08-07 DIAGNOSIS — F1910 Other psychoactive substance abuse, uncomplicated: Secondary | ICD-10-CM | POA: Diagnosis present

## 2016-08-07 DIAGNOSIS — Z8673 Personal history of transient ischemic attack (TIA), and cerebral infarction without residual deficits: Secondary | ICD-10-CM

## 2016-08-07 DIAGNOSIS — R51 Headache: Secondary | ICD-10-CM | POA: Diagnosis present

## 2016-08-07 DIAGNOSIS — K219 Gastro-esophageal reflux disease without esophagitis: Secondary | ICD-10-CM

## 2016-08-07 DIAGNOSIS — Z79899 Other long term (current) drug therapy: Secondary | ICD-10-CM | POA: Diagnosis not present

## 2016-08-07 DIAGNOSIS — M549 Dorsalgia, unspecified: Secondary | ICD-10-CM | POA: Diagnosis present

## 2016-08-07 DIAGNOSIS — G8929 Other chronic pain: Secondary | ICD-10-CM | POA: Diagnosis present

## 2016-08-07 DIAGNOSIS — Z87891 Personal history of nicotine dependence: Secondary | ICD-10-CM | POA: Diagnosis not present

## 2016-08-07 DIAGNOSIS — I110 Hypertensive heart disease with heart failure: Secondary | ICD-10-CM | POA: Diagnosis present

## 2016-08-07 DIAGNOSIS — Z8249 Family history of ischemic heart disease and other diseases of the circulatory system: Secondary | ICD-10-CM | POA: Diagnosis not present

## 2016-08-07 DIAGNOSIS — I160 Hypertensive urgency: Secondary | ICD-10-CM | POA: Diagnosis present

## 2016-08-07 DIAGNOSIS — I5022 Chronic systolic (congestive) heart failure: Secondary | ICD-10-CM | POA: Diagnosis present

## 2016-08-07 DIAGNOSIS — F419 Anxiety disorder, unspecified: Secondary | ICD-10-CM | POA: Diagnosis present

## 2016-08-07 DIAGNOSIS — I639 Cerebral infarction, unspecified: Secondary | ICD-10-CM

## 2016-08-07 LAB — BASIC METABOLIC PANEL
Anion gap: 8 (ref 5–15)
BUN: 7 mg/dL (ref 6–20)
CHLORIDE: 106 mmol/L (ref 101–111)
CO2: 21 mmol/L — AB (ref 22–32)
CREATININE: 0.75 mg/dL (ref 0.44–1.00)
Calcium: 8.4 mg/dL — ABNORMAL LOW (ref 8.9–10.3)
GFR calc non Af Amer: >60 mL/min (ref >60)
Glucose, Bld: 102 mg/dL — ABNORMAL HIGH (ref 65–99)
Potassium: 3.5 mmol/L (ref 3.5–5.1)
Sodium: 135 mmol/L (ref 135–145)

## 2016-08-07 LAB — CBC
HCT: 38.7 % (ref 36.0–46.0)
Hemoglobin: 11.9 g/dL — ABNORMAL LOW (ref 12.0–15.0)
MCH: 19.1 pg — AB (ref 26.0–34.0)
MCHC: 30.7 g/dL (ref 30.0–36.0)
MCV: 62 fL — AB (ref 78.0–100.0)
PLATELETS: 312 10*3/uL (ref 150–400)
RBC: 6.24 MIL/uL — AB (ref 3.87–5.11)
RDW: 17.5 % — ABNORMAL HIGH (ref 11.5–15.5)
WBC: 10.5 10*3/uL (ref 4.0–10.5)

## 2016-08-07 LAB — RAPID URINE DRUG SCREEN, HOSP PERFORMED
AMPHETAMINES: NOT DETECTED
Barbiturates: NOT DETECTED
Benzodiazepines: NOT DETECTED
Cocaine: NOT DETECTED
Opiates: POSITIVE — AB
TETRAHYDROCANNABINOL: NOT DETECTED

## 2016-08-07 LAB — TROPONIN I

## 2016-08-07 LAB — MRSA PCR SCREENING: MRSA by PCR: NEGATIVE

## 2016-08-07 LAB — HIV ANTIBODY (ROUTINE TESTING W REFLEX): HIV Screen 4th Generation wRfx: NONREACTIVE

## 2016-08-07 LAB — BRAIN NATRIURETIC PEPTIDE: B NATRIURETIC PEPTIDE 5: 175.1 pg/mL — AB (ref 0.0–100.0)

## 2016-08-07 LAB — LIPASE, BLOOD: LIPASE: 22 U/L (ref 11–51)

## 2016-08-07 MED ORDER — MORPHINE SULFATE (PF) 4 MG/ML IV SOLN
4.0000 mg | Freq: Once | INTRAVENOUS | Status: AC
  Administered 2016-08-07: 4 mg via INTRAVENOUS
  Filled 2016-08-07: qty 1

## 2016-08-07 MED ORDER — ZOLPIDEM TARTRATE 5 MG PO TABS
5.0000 mg | ORAL_TABLET | Freq: Every evening | ORAL | Status: DC | PRN
  Administered 2016-08-07: 5 mg via ORAL
  Filled 2016-08-07: qty 1

## 2016-08-07 MED ORDER — CARIPRAZINE HCL 1.5 MG PO CAPS
4.5000 mg | ORAL_CAPSULE | Freq: Every day | ORAL | Status: DC
Start: 2016-08-07 — End: 2016-08-09
  Administered 2016-08-08 – 2016-08-09 (×2): 4.5 mg via ORAL
  Filled 2016-08-07 (×4): qty 3

## 2016-08-07 MED ORDER — LABETALOL HCL 5 MG/ML IV SOLN
0.5000 mg/min | INTRAVENOUS | Status: DC
  Administered 2016-08-07: 0.5 mg/min via INTRAVENOUS
  Administered 2016-08-07: 2 mg/min via INTRAVENOUS
  Filled 2016-08-07: qty 20
  Filled 2016-08-07: qty 100

## 2016-08-07 MED ORDER — ACETAMINOPHEN 325 MG PO TABS
650.0000 mg | ORAL_TABLET | Freq: Four times a day (QID) | ORAL | Status: DC | PRN
Start: 2016-08-07 — End: 2016-08-09
  Administered 2016-08-07 – 2016-08-09 (×2): 650 mg via ORAL
  Filled 2016-08-07 (×2): qty 2

## 2016-08-07 MED ORDER — LISINOPRIL 10 MG PO TABS
10.0000 mg | ORAL_TABLET | Freq: Every day | ORAL | Status: DC
  Administered 2016-08-07 – 2016-08-09 (×3): 10 mg via ORAL
  Filled 2016-08-07 (×3): qty 1

## 2016-08-07 MED ORDER — PAROXETINE HCL 20 MG PO TABS
20.0000 mg | ORAL_TABLET | Freq: Every day | ORAL | Status: DC
  Administered 2016-08-07 – 2016-08-09 (×3): 20 mg via ORAL
  Filled 2016-08-07 (×4): qty 1

## 2016-08-07 MED ORDER — HYDRALAZINE HCL 20 MG/ML IJ SOLN
10.0000 mg | Freq: Once | INTRAMUSCULAR | Status: AC
  Administered 2016-08-07: 10 mg via INTRAVENOUS
  Filled 2016-08-07: qty 1

## 2016-08-07 MED ORDER — CARIPRAZINE HCL 4.5 MG PO CAPS: 4.5000 mg | ORAL_CAPSULE | Freq: Every day | ORAL | Status: DC

## 2016-08-07 MED ORDER — NITROGLYCERIN 2 % TD OINT
1.0000 [in_us] | TOPICAL_OINTMENT | Freq: Once | TRANSDERMAL | Status: AC
  Administered 2016-08-07: 1 [in_us] via TOPICAL
  Filled 2016-08-07: qty 1

## 2016-08-07 MED ORDER — NITROGLYCERIN IN D5W 200-5 MCG/ML-% IV SOLN
2.0000 ug/min | INTRAVENOUS | Status: DC
  Administered 2016-08-07: 10 ug/min via INTRAVENOUS
  Filled 2016-08-07: qty 250

## 2016-08-07 MED ORDER — CLONIDINE HCL 0.1 MG PO TABS: 0.3000 mg | ORAL_TABLET | Freq: Three times a day (TID) | ORAL | Status: DC

## 2016-08-07 MED ORDER — OXYCODONE HCL 5 MG PO TABS
5.0000 mg | ORAL_TABLET | Freq: Once | ORAL | Status: AC
  Administered 2016-08-07: 5 mg via ORAL
  Filled 2016-08-07: qty 1

## 2016-08-07 MED ORDER — PANTOPRAZOLE SODIUM 40 MG PO TBEC
40.0000 mg | DELAYED_RELEASE_TABLET | Freq: Every day | ORAL | Status: DC
  Administered 2016-08-07 – 2016-08-09 (×3): 40 mg via ORAL
  Filled 2016-08-07 (×3): qty 1

## 2016-08-07 MED ORDER — ONDANSETRON HCL 4 MG/2ML IJ SOLN
4.0000 mg | Freq: Three times a day (TID) | INTRAMUSCULAR | Status: DC | PRN
  Administered 2016-08-07 – 2016-08-09 (×3): 4 mg via INTRAVENOUS
  Filled 2016-08-07 (×4): qty 2

## 2016-08-07 MED ORDER — ENOXAPARIN SODIUM 80 MG/0.8ML ~~LOC~~ SOLN
65.0000 mg | SUBCUTANEOUS | Status: DC
  Administered 2016-08-07 – 2016-08-08 (×2): 65 mg via SUBCUTANEOUS
  Filled 2016-08-07 (×2): qty 0.8

## 2016-08-07 MED ORDER — ACETAMINOPHEN 650 MG RE SUPP: 650.0000 mg | Freq: Four times a day (QID) | RECTAL | Status: DC | PRN

## 2016-08-07 MED ORDER — ASPIRIN EC 81 MG PO TBEC
81.0000 mg | DELAYED_RELEASE_TABLET | Freq: Every day | ORAL | Status: DC
  Administered 2016-08-07 – 2016-08-09 (×3): 81 mg via ORAL
  Filled 2016-08-07 (×3): qty 1

## 2016-08-07 MED ORDER — TRAMADOL HCL 50 MG PO TABS
50.0000 mg | ORAL_TABLET | Freq: Four times a day (QID) | ORAL | Status: DC | PRN
  Administered 2016-08-07 – 2016-08-09 (×3): 50 mg via ORAL
  Filled 2016-08-07 (×4): qty 1

## 2016-08-07 MED ORDER — NITROGLYCERIN 0.4 MG SL SUBL: 0.4000 mg | SUBLINGUAL_TABLET | SUBLINGUAL | Status: DC | PRN

## 2016-08-07 MED ORDER — CLONIDINE HCL 0.3 MG PO TABS
0.3000 mg | ORAL_TABLET | Freq: Three times a day (TID) | ORAL | Status: DC
  Administered 2016-08-07 – 2016-08-09 (×8): 0.3 mg via ORAL
  Filled 2016-08-07 (×9): qty 1

## 2016-08-07 MED ORDER — NICARDIPINE HCL IN NACL 20-0.86 MG/200ML-% IV SOLN
3.0000 mg/h | INTRAVENOUS | Status: DC
  Administered 2016-08-07 (×2): 5 mg/h via INTRAVENOUS
  Administered 2016-08-07: 10 mg/h via INTRAVENOUS
  Administered 2016-08-07: 15 mg/h via INTRAVENOUS
  Administered 2016-08-07: 5 mg/h via INTRAVENOUS
  Filled 2016-08-07 (×8): qty 200

## 2016-08-07 MED ORDER — MORPHINE SULFATE (PF) 4 MG/ML IV SOLN
4.0000 mg | INTRAVENOUS | Status: DC | PRN
  Administered 2016-08-07 (×3): 4 mg via INTRAVENOUS
  Administered 2016-08-08: 2 mg via INTRAVENOUS
  Administered 2016-08-08: 4 mg via INTRAVENOUS
  Filled 2016-08-07 (×5): qty 1

## 2016-08-07 MED ORDER — LABETALOL HCL 5 MG/ML IV SOLN
20.0000 mg | Freq: Once | INTRAVENOUS | Status: AC
  Administered 2016-08-07: 20 mg via INTRAVENOUS
  Filled 2016-08-07: qty 4

## 2016-08-07 MED ORDER — HYDRALAZINE HCL 20 MG/ML IJ SOLN
10.0000 mg | INTRAMUSCULAR | Status: DC | PRN
  Administered 2016-08-07: 10 mg via INTRAVENOUS
  Filled 2016-08-07: qty 1

## 2016-08-07 MED ORDER — SODIUM CHLORIDE 0.9% FLUSH
3.0000 mL | Freq: Two times a day (BID) | INTRAVENOUS | Status: DC
  Administered 2016-08-08 – 2016-08-09 (×3): 3 mL via INTRAVENOUS

## 2016-08-07 NOTE — Plan of Care (Signed)
Problem: Pain Managment: Goal: General experience of comfort will improve Outcome: Not Progressing Patient still c/o severe pain, headache. No prn's available. MD aware.

## 2016-08-07 NOTE — H&P (Addendum)
History and Physical    Kelly Novak ACZ:[id number] DOB: 1970/04/21 DOA: 08/06/2016  Referring MD/NP/PA:   PCP: Benito Mccreedy, MD   Patient coming from:  The patient is coming from home.  At baseline, pt is independent for most of ADL.   Chief Complaint: chest pain and elevated blood pressure  HPI: Kelly Novak is a 46 y.o. female with medical history significant of medication noncompliance, hypertension, GERD, depression, stroke, morbid obesity, bipolar disorder, chronic back pain, sickle cell trait, polysubstance abuse (tobacco, marijuana, cocaine, amphetamine), who presents with chest pain and elevated blood pressure.  Patient states that she ran out of her blood pressure medications yesterday. Her blood pressure elevated. She measured her blood pressure at home which was 109 systolically. She reports chest pain, which is located in the substernal area, 10 out of 10 in severity, nonradiating. She has mild cough, but does not have shortness of breath, fever or chills. Patient states that she does not have nausea, vomiting, diarrhea, abdominal pain at home, but she started having abdominal pain after she was given medication treatment in ED. Her abdominal pain is located in the central abdomen, moderate, nonradiating. Still does not have nausea, vomiting or diarrhea. Denies symptoms of UTI or unilateral weakness.  ED Course: pt was found to have blood pressure 238/120, negative troponin, WBC 9.0, creatinine normal, temperature 99.8, negative chest x-ray, negative CT head for acute intracranial abnormalities. Patient received one dose of nitroglycerin patch 15 mg, 10 mg of hydralazine IV, clonidine 0.3 orally, but her blood pressure is still elevated at 214/121. Pt is placed on SDU for obs.   Review of Systems:   General: no fevers, chills, no changes in body weight, has fatigue HEENT: no blurry vision, hearing changes or sore throat Respiratory: no dyspnea, has mild coughing,  wheezing CV: has chest pain, no palpitations GI: no nausea, vomiting, has abdominal pain, no diarrhea, constipation GU: no dysuria, burning on urination, increased urinary frequency, hematuria  Ext: no leg edema Neuro: no unilateral weakness, numbness, or tingling, no vision change or hearing loss Skin: no rash, no skin tear. MSK: No muscle spasm, no deformity, no limitation of range of movement in spin Heme: No easy bruising.  Travel history: No recent long distant travel.  Allergy: No Known Allergies  Past Medical History:  Diagnosis Date  . Anemia   . Anxiety   . Arthritis   . Bipolar 1 disorder ([place])   . Blood dyscrasia    sickle cell trait  . Chronic back pain   . Chronic systolic (congestive) heart failure ([place])   . GERD (gastroesophageal reflux disease)   . Hypertension   . Obesity   . Polysubstance abuse   . Shortness of breath    on exertion due to weight  . Stroke [facility])     Past Surgical History:  Procedure Laterality Date  . CESAREAN SECTION    . CHOLECYSTECTOMY    . HYSTEROSCOPY N/A 08/23/2013   Procedure: HYSTEROSCOPY D&C WITH HYDROTHERMAL ABLATION;  Surgeon: Osborne Oman, MD;  Location: [facility];  Service: Gynecology;  Laterality: N/A;  . TUBAL LIGATION      Social History:  reports that she has quit smoking. Her smoking use included Cigarettes. She smoked 0.00 packs per day for 14.00 years. She has never used smokeless tobacco. She reports that she does not drink alcohol or use drugs.  Family History:  Family History  Problem Relation Age of Onset  . CAD Father   .  Heart failure Brother   . Hypertension Brother   . CAD Paternal Aunt      Prior to Admission medications   Medication Sig Start Date End Date Taking? Authorizing Provider  Cariprazine HCl (VRAYLAR) 4.5 MG CAPS Take 4.5 mg by mouth at bedtime.   Yes [provider]  carvedilol (COREG) 25 MG tablet Take 1 tablet (25 mg total) by mouth 2 (two) times daily with a meal. 11/05/15  Yes  Florencia Reasons, MD  cloNIDine (CATAPRES) 0.3 MG tablet Take 0.3 mg by mouth 3 (three) times daily.   Yes [provider]  hydrALAZINE (APRESOLINE) 100 MG tablet Take 1 tablet (100 mg total) by mouth every 8 (eight) hours. 11/05/15  Yes Florencia Reasons, MD  hydrochlorothiazide (HYDRODIURIL) 50 MG tablet Take 1 tablet (50 mg total) by mouth daily. 11/05/15  Yes Florencia Reasons, MD  lisinopril (PRINIVIL,ZESTRIL) 10 MG tablet Take 1 tablet (10 mg total) by mouth daily. 11/05/15  Yes Florencia Reasons, MD  nitroGLYCERIN (NITROSTAT) 0.4 MG SL tablet Place 1 tablet (0.4 mg total) under the tongue every 5 (five) minutes as needed for chest pain. 11/05/15  Yes Florencia Reasons, MD  omeprazole (PRILOSEC) 20 MG capsule Take 20 mg by mouth 3 (three) times daily before meals. Reported on 08/24/2015   Yes [provider]  PARoxetine (PAXIL) 20 MG tablet Take 1 tablet (20 mg total) by mouth daily. 03/16/15  Yes Molpus, John, MD  traMADol (ULTRAM) 50 MG tablet Take 1 tablet (50 mg total) by mouth every 6 (six) hours as needed. Patient taking differently: Take 50 mg by mouth every 6 (six) hours as needed for moderate pain.  12/07/15  Yes Jacqualine Mau, NP  aspirin EC 81 MG EC tablet Take 1 tablet (81 mg total) by mouth daily. Patient not taking: Reported on 08/06/2016 11/05/15   Florencia Reasons, MD  cloNIDine (CATAPRES) 0.1 MG tablet Take 3 tablets (0.3 mg total) by mouth at bedtime. Patient not taking: Reported on 08/06/2016 11/05/15   Florencia Reasons, MD  ibuprofen (ADVIL,MOTRIN) 800 MG tablet Take 1 tablet (800 mg total) by mouth every 8 (eight) hours as needed for moderate pain. Patient not taking: Reported on 08/06/2016 12/25/15   Milton Ferguson, MD    Physical Exam: Vitals:   08/07/16 0300 08/07/16 0315 08/07/16 0345 08/07/16 0419  BP: (!) 177/95 (!) 201/126 (!) 214/121 (!) 209/102  Pulse: 93 99  90  Resp: (!) 28 16 20 18   Temp:      TempSrc:      SpO2: 100% 100%  100%  Weight:      Height:       General: Not in acute  distress HEENT:       Eyes: PERRL, EOMI, no scleral icterus.       ENT: No discharge from the ears and nose, no pharynx injection, no tonsillar enlargement.        Neck: No JVD, no bruit, no mass felt. Heme: No neck lymph node enlargement. Cardiac: S1/S2, RRR, No murmurs, No gallops or rubs. Respiratory:  No rales, wheezing, rhonchi or rubs. GI: Soft, nondistended, has mild tenderness in central abdomen, no rebound pain, no organomegaly, BS present. GU: No hematuria Ext: No pitting leg edema bilaterally. 2+DP/PT pulse bilaterally. Musculoskeletal: No joint deformities, No joint redness or warmth, no limitation of ROM in spin. Skin: No rashes.  Neuro: Alert, oriented X3, cranial nerves II-XII grossly intact, moves all extremities normally. Psych: Patient is not psychotic, no suicidal or hemocidal  ideation.  Labs on Admission: I have personally reviewed following labs and imaging studies  CBC:  Recent Labs Lab 08/06/16 2159  WBC 9.0  NEUTROABS 5.2  HGB 11.5*  HCT 37.3  MCV 63.1*  PLT 716   Basic Metabolic Panel:  Recent Labs Lab 08/06/16 2159  NA 139  K 3.5  CL 108  CO2 23  GLUCOSE 112*  BUN 8  CREATININE 0.91  CALCIUM 8.5*   GFR: Estimated Creatinine Clearance: 113.8 mL/min (by C-G formula based on SCr of 0.91 mg/dL). Liver Function Tests:  Recent Labs Lab 08/06/16 2159  AST 18  ALT 14  ALKPHOS 101  BILITOT 0.8  PROT 6.6  ALBUMIN 3.6   No results for input(s): LIPASE, AMYLASE in the last 168 hours. No results for input(s): AMMONIA in the last 168 hours. Coagulation Profile: No results for input(s): INR, PROTIME in the last 168 hours. Cardiac Enzymes: No results for input(s): CKTOTAL, CKMB, CKMBINDEX, TROPONINI in the last 168 hours. BNP (last 3 results) No results for input(s): PROBNP in the last 8760 hours. HbA1C: No results for input(s): HGBA1C in the last 72 hours. CBG: No results for input(s): GLUCAP in the last 168 hours. Lipid Profile: No  results for input(s): CHOL, HDL, LDLCALC, TRIG, CHOLHDL, LDLDIRECT in the last 72 hours. Thyroid Function Tests: No results for input(s): TSH, T4TOTAL, FREET4, T3FREE, THYROIDAB in the last 72 hours. Anemia Panel: No results for input(s): VITAMINB12, FOLATE, FERRITIN, TIBC, IRON, RETICCTPCT in the last 72 hours. Urine analysis:    Component Value Date/Time   COLORURINE AMBER (A) 12/25/2015 2044   APPEARANCEUR CLOUDY (A) 12/25/2015 2044   LABSPEC 1.031 (H) 12/25/2015 2044   PHURINE 6.5 12/25/2015 2044   GLUCOSEU NEGATIVE 12/25/2015 2044   HGBUR NEGATIVE 12/25/2015 2044   BILIRUBINUR NEGATIVE 12/25/2015 2044   KETONESUR NEGATIVE 12/25/2015 2044   PROTEINUR 30 (A) 12/25/2015 2044   UROBILINOGEN 0.2 06/06/2013 1815   NITRITE NEGATIVE 12/25/2015 2044   LEUKOCYTESUR NEGATIVE 12/25/2015 2044   Sepsis Labs: @LABRCNTIP (procalcitonin:4,lacticidven:4) )No results found for this or any previous visit (from the past 240 hour(s)).   Radiological Exams on Admission: Dg Chest 2 View  Result Date: 08/07/2016 CLINICAL DATA:  High blood pressure with weakness EXAM: CHEST  2 VIEW COMPARISON:  11/03/2015 FINDINGS: Minimal atelectasis at the left base. No focal consolidation or pleural effusion. Stable cardiomediastinal silhouette. No pneumothorax. IMPRESSION: No active cardiopulmonary disease. Electronically Signed   By: Donavan Foil M.D.   On: 08/07/2016 02:44   Ct Head Wo Contrast  Result Date: 08/07/2016 CLINICAL DATA:  Acute onset of high blood pressure. Initial encounter. EXAM: CT HEAD WITHOUT CONTRAST TECHNIQUE: Contiguous axial images were obtained from the base of the skull through the vertex without intravenous contrast. COMPARISON:  CT of the head performed 12/25/2015 FINDINGS: Brain: No evidence of acute infarction, hemorrhage, hydrocephalus, extra-axial collection or mass lesion/mass effect. Chronic infarcts are noted at both sides of the pons, larger on the right. The cerebellum and fourth  ventricle are within normal limits. The third and lateral ventricles, and basal ganglia are unremarkable in appearance. The cerebral hemispheres are symmetric in appearance, with normal gray-white differentiation. No mass effect or midline shift is seen. Vascular: No hyperdense vessel or unexpected calcification. Skull: There is no evidence of fracture; visualized osseous structures are unremarkable in appearance. Sinuses/Orbits: The visualized portions of the orbits are within normal limits. The paranasal sinuses and mastoid air cells are well-aerated. Other: No additional soft tissue abnormalities are seen. IMPRESSION:  1. No acute intracranial pathology seen on CT. 2. Chronic infarcts at both sides of the pons, larger on the right. Electronically Signed   By: Garald Balding M.D.   On: 08/07/2016 00:39     EKG: Independently reviewed. Sinus rhythm, QTC 457, mild ST depression in inferior leads, V5-V6.     Assessment/Plan Principal Problem:   Hypertensive urgency Active Problems:   Chest pain   Depression   GERD (gastroesophageal reflux disease)   Abdominal pain   Stroke (cerebrum) (H[facility])   Polysubstance abuse   Chronic systolic (congestive) heart failure ([facility])   Hypertensive urgency: This is due to medication noncompliance, and possible drug abuse. Patient received one dose of nitroglycerin patch 15 mg, 10 mg of hydralazine IV, clonidine 0.3 orally, but her blood pressure is still elevated at 214/121.   -Will place in SDU for obs -Continue Nitroglycerin drip for now, the of goal of bp reduction is by about 25 to 30% in first several hours, at SBP 160 to 180 mmHg. -resume home bp meds: Coreg, clonidine, hydralazine, Lisinopril, HCTZ -Consult o CM  Chest pain: Most likely due to demand ischemia secondary to hypertensive urgency. Patient states that her chest pain has almost resolved currently. -trop x 3 -ASA, coreg -continue home tramadol  Chronic systolic congestive heart failure:  Patient does not have leg edema. Chest x-ray has no pulmonary edema. CHF seems to be compensated. -Check BNP -Continue aspirin, Coreg and HCTZ  Depression: Stable, no suicidal or homicidal ideations. -Continue home medications: Paxil  GERD: -Protonix  Hx of stroke: -continue ASA  Polysubstance abuse: -check UDS -did counseling about importance of quitting substance use  Abdominal pain: Etiology is not clear. No nausea, vomiting or diarrhea. No acute abdomen on physical examination. - check lipase -On Protonix -When necessary Zofran for nausea   DVT ppx: SQ Lovenox Code Status: Full code Family Communication: Yes, patient's daughter-in law and son  at bed side Disposition Plan:  Anticipate discharge back to previous home environment Consults called:  none Admission status: Obs / tele    Date of Service 08/07/2016    Ivor Costa [facility] Pager [phone]  If 7PM-7AM, please contact night-coverage [email or web address] Password [facility] 08/07/2016, 4:26 AM

## 2016-08-07 NOTE — Consult Note (Signed)
CARDIOLOGY CONSULT NOTE   Patient ID: Kelly Novak MRN: [id number] DOB/AGE: 1970-05-18 46 y.o.  Admit date: 08/06/2016  Primary Physician   Benito Mccreedy, MD Primary Cardiologist   Dr Acie Fredrickson Reason for Consultation   HTN urgency Requesting MD: Dr Lonny Prude  HPI: Kelly Novak is a 46 y.o. female with hx of med noncompliance, HTN, GERD, CVA, bipolar d/o, morbid obesity, polysubstance abuse (tobacco, marijuana, cocaine, amphetamine), chronic back pain.   She was admitted early 06/15 with CP, and HTN w/ BP 231/115.   Pt is being seen today for the evaluation of HTN urgency at the request of Dr Lonny Prude.  She was on a nitro gtt, but BP was still very elevated and she developed a severe headache. She has had Coreg 25 mg, Catapres 0.3 mg, hydralazine 100 mg, lisinopril 10 mg, HCTZ 50 mg at 8 am. BP now 192/119.  Pt states her BP is always this high. She checks it sometimes at home and it is always elevated, cannot remember any specific readings.  She ran out of her BP meds 2 days ago. Says she was told there were no refills left. After she ran out of her meds, she started feeling bad. She has had chest pain for 2 days, not exertional, is continuous. She felt generally bad as well. She came to the ER to get back on her meds and because of the chest pain.   She has problems affording her medications.  She is no longer complaining of chest pain, her main complaint is the headache she got from the nitro. Her nitro has been off for a while and she had oxycodone 5 mg, but no significant change.    Past Medical History:  Diagnosis Date  . Anemia   . Anxiety   . Arthritis   . Bipolar 1 disorder ([place])   . Blood dyscrasia    sickle cell trait  . Chronic back pain   . Chronic systolic (congestive) heart failure ([place])   . GERD (gastroesophageal reflux disease)   . Hypertension   . Obesity   . Polysubstance abuse   . Shortness of breath    on exertion due to weight  . Stroke [facility])       Past Surgical History:  Procedure Laterality Date  . CESAREAN SECTION    . CHOLECYSTECTOMY    . HYSTEROSCOPY N/A 08/23/2013   Procedure: HYSTEROSCOPY D&C WITH HYDROTHERMAL ABLATION;  Surgeon: Osborne Oman, MD;  Location: [facility];  Service: Gynecology;  Laterality: N/A;  . TUBAL LIGATION      No Known Allergies  I have reviewed the patient's current medications . aspirin EC  81 mg Oral Daily  . Cariprazine HCl  4.5 mg Oral QHS  . carvedilol  25 mg Oral BID WC  . cloNIDine  0.3 mg Oral TID  . enoxaparin (LOVENOX) injection  65 mg Subcutaneous Q24H  . hydrALAZINE  100 mg Oral Q8H  . hydrochlorothiazide  50 mg Oral Daily  . lisinopril  10 mg Oral Daily  . pantoprazole  40 mg Oral Daily  . PARoxetine  20 mg Oral Daily  . sodium chloride flush  3 mL Intravenous Q12H   . niCARDipine     acetaminophen **OR** acetaminophen, nitroGLYCERIN, ondansetron, traMADol, zolpidem  Medication Sig  Cariprazine HCl (VRAYLAR) 4.5 MG CAPS Take 4.5 mg by mouth at bedtime.  carvedilol (COREG) 25 MG tablet    06/30/2016 Take 1 tablet (25 mg total) by mouth  2 (two) times daily with a meal.  cloNIDine (CATAPRES) 0.3 MG tablet   07/24/2016 Take 0.3 mg by mouth 3 (three) times daily.  hydrALAZINE (APRESOLINE) 100 MG tablet Take 1 tablet (100 mg total) by mouth every 8 (eight) hours.  hydrochlorothiazide (HYDRODIURIL) 50 MG tablet Take 1 tablet (50 mg total) by mouth daily.  lisinopril (PRINIVIL,ZESTRIL) 10 MG tablet Take 1 tablet (10 mg total) by mouth daily.  nitroGLYCERIN (NITROSTAT) 0.4 MG SL tablet Place 1 tablet (0.4 mg total) under the tongue every 5 (five) minutes as needed for chest pain.  omeprazole (PRILOSEC) 20 MG capsule Take 20 mg by mouth 3 (three) times daily before meals. Reported on 08/24/2015  PARoxetine (PAXIL) 20 MG tablet Take 1 tablet (20 mg total) by mouth daily.  traMADol (ULTRAM) 50 MG tablet Take 1 tablet (50 mg total) by mouth every 6 (six) hours as needed. Patient taking  differently: Take 50 mg by mouth every 6 (six) hours as needed for moderate pain.   aspirin EC 81 MG EC tablet Take 1 tablet (81 mg total) by mouth daily. Patient not taking: Reported on 08/06/2016  cloNIDine (CATAPRES) 0.1 MG tablet  Take 3 tablets (0.3 mg total) by mouth at bedtime. Patient not taking: Reported on 08/06/2016  ibuprofen (ADVIL,MOTRIN) 800 MG tablet Take 1 tablet (800 mg total) by mouth every 8 (eight) hours as needed for moderate pain. Patient not taking: Reported on 08/06/2016     Social History   Social History  . Marital status: Widowed    Spouse name: N/A  . Number of children: N/A  . Years of education: N/A   Occupational History  . Not on file.   Social History Main Topics  . Smoking status: Former Smoker    Packs/day: 0.00    Years: 14.00    Types: Cigarettes  . Smokeless tobacco: Never Used  . Alcohol use No  . Drug use: No  . Sexual activity: No   Other Topics Concern  . Not on file   Social History Narrative  . No narrative on file    Family Status  Relation Status  . Father Deceased  . Brother Alive  . Ethlyn Daniels Deceased   Family History  Problem Relation Age of Onset  . CAD Father   . Heart failure Brother   . Hypertension Brother   . CAD Paternal Aunt      ROS:  Full 14 point review of systems complete and found to be negative unless listed above.  Physical Exam: Blood pressure (!) 192/119, pulse 98, temperature 98 F (36.7 C), temperature source Oral, resp. rate 18, height 5\' 7"  (1.702 m), weight 300 lb (136.1 kg), last menstrual period 08/06/2013, SpO2 98 %.  General: Well developed, well nourished, female in no acute distress Head: Eyes PERRLA, No xanthomas.   Normocephalic and atraumatic, oropharynx without edema or exudate. Dentition: fair Lungs: clear bilaterally Heart: HRRR S1 S2, no rub/gallop, soft murmur. pulses are 2+ all 4 extrem.   Neck: No carotid bruits. No lymphadenopathy.  JVD not elevated Abdomen: Bowel sounds  present, abdomen soft and non-tender without masses or hernias noted. Msk:  No spine or cva tenderness. No weakness, no joint deformities or effusions. Extremities: No clubbing or cyanosis. No edema.  Neuro: A little sleepy from meds, but oriented X 3. No focal deficits noted. Skin: No rashes or lesions noted.  Labs:   Lab Results  Component Value Date   WBC 10.5 08/07/2016   HGB 11.9 (  L) 08/07/2016   HCT 38.7 08/07/2016   MCV 62.0 (L) 08/07/2016   PLT 312 08/07/2016     Recent Labs Lab 08/06/16 2159 08/07/16 0629  NA 139 135  K 3.5 3.5  CL 108 106  CO2 23 21*  BUN 8 7  CREATININE 0.91 0.75  CALCIUM 8.5* 8.4*  PROT 6.6  --   BILITOT 0.8  --   ALKPHOS 101  --   ALT 14  --   AST 18  --   GLUCOSE 112* 102*  ALBUMIN 3.6  --     Recent Labs  08/07/16 0629 08/07/16 0953  TROPONINI <0.03 <0.03    Recent Labs  08/06/16 2232  TROPIPOC 0.01   B Natriuretic Peptide  Date/Time Value Ref Range Status  08/07/2016 06:29 AM 175.1 (H) 0.0 - 100.0 pg/mL Final   Lipase  Date/Time Value Ref Range Status  08/07/2016 06:29 AM 22 11 - 51 U/L Final   Drugs of Abuse     Component Value Date/Time   LABOPIA POSITIVE (A) 12/25/2015 2044   COCAINSCRNUR NONE DETECTED 12/25/2015 2044   COCAINSCRNUR NEG 04/10/2008 2027   LABBENZ POSITIVE (A) 12/25/2015 2044   LABBENZ NEG 04/10/2008 2027   AMPHETMU POSITIVE (A) 12/25/2015 2044   THCU POSITIVE (A) 12/25/2015 2044   LABBARB NONE DETECTED 12/25/2015 2044      Echo: 11/04/2015 - Procedure narrative: Transthoracic echocardiography. Image   quality was adequate. The study was technically difficult. - Left ventricle: The cavity size was normal. There was severe   concentric hypertrophy. Systolic function was mildly reduced. The   estimated ejection fraction was in the range of 45% to 50%. Mild   diffuse hypokinesis with no identifiable regional variations.   Although no diagnostic regional wall motion abnormality was    identified, this possibility cannot be completely excluded on the   basis of this study. Doppler parameters are consistent with   abnormal left ventricular relaxation (grade 1 diastolic   dysfunction). Indeterminate left atrial filling pressure. - Mitral valve: Calcified annulus.  ECG:  08/06/2016 SR, diffuse T wave changes compared to 12/2015, may be 2nd LVH  Cath: n/a  Radiology:  Dg Chest 2 View Result Date: 08/07/2016 CLINICAL DATA:  High blood pressure with weakness EXAM: CHEST  2 VIEW COMPARISON:  11/03/2015 FINDINGS: Minimal atelectasis at the left base. No focal consolidation or pleural effusion. Stable cardiomediastinal silhouette. No pneumothorax. IMPRESSION: No active cardiopulmonary disease. Electronically Signed   By: Donavan Foil M.D.   On: 08/07/2016 02:44   Ct Head Wo Contrast Result Date: 08/07/2016 CLINICAL DATA:  Acute onset of high blood pressure. Initial encounter. EXAM: CT HEAD WITHOUT CONTRAST TECHNIQUE: Contiguous axial images were obtained from the base of the skull through the vertex without intravenous contrast. COMPARISON:  CT of the head performed 12/25/2015 FINDINGS: Brain: No evidence of acute infarction, hemorrhage, hydrocephalus, extra-axial collection or mass lesion/mass effect. Chronic infarcts are noted at both sides of the pons, larger on the right. The cerebellum and fourth ventricle are within normal limits. The third and lateral ventricles, and basal ganglia are unremarkable in appearance. The cerebral hemispheres are symmetric in appearance, with normal gray-white differentiation. No mass effect or midline shift is seen. Vascular: No hyperdense vessel or unexpected calcification. Skull: There is no evidence of fracture; visualized osseous structures are unremarkable in appearance. Sinuses/Orbits: The visualized portions of the orbits are within normal limits. The paranasal sinuses and mastoid air cells are well-aerated. Other: No additional soft tissue  abnormalities are seen. IMPRESSION: 1. No acute intracranial pathology seen on CT. 2. Chronic infarcts at both sides of the pons, larger on the right. Electronically Signed   By: Garald Balding M.D.   On: 08/07/2016 00:39    ASSESSMENT AND PLAN:   The patient was seen today by Dr Acie Fredrickson, the patient evaluated and the data reviewed.   Principal Problem:   Hypertensive urgency - agree with tx ICU for Cardene gtt. - Contacted [facility], [facility] and she filled the Clonidine 0.3 mg tid on 07/24/2016, filled the Coreg 25 mg bid in May, so should have run out last week.  - ALL other BP meds have not been filled, ever. Rx was sent in 03/2016, but never picked up. - agree with restarting oral meds, high likelihood of early re-hospitalization if pt does not get rx filled as outpatient. This may be 2nd financial issues. - will add prn hydralazine for short-term help.    Bipolar d/o - [facility] has not filled her psych meds either, not sure if she was getting those elsewhere.  - if her psych issues are controlled, she might be more compliant with her BP meds.  Active Problems:   Chest pain - no exertional component, sx were continuous for > 24 hr w/ ez neg MI - ECG with some minor changes, will ck echo. - MD advise if further testing needed.  Otherwise, per IM.   Depression   GERD (gastroesophageal reflux disease)   Abdominal pain   Stroke (cerebrum) ([facility])   Polysubstance abuse   Chronic systolic (congestive) heart failure ([place])   Signed: Lenoard Aden 08/07/2016 11:38 AM Beeper [phone]  Co-Sign MD   Attending Note:   The patient was seen and examined.  Agree with assessment and plan as noted above.  Changes made to the above note as needed.  Patient seen and independently examined with Rosaria Ferries, PA .   We discussed all aspects of the encounter. I agree with the assessment and plan as stated above.  1. Malignant HTN"  She has issues with noncompliance - this is a  major hurdle for her .  Will start Lebatolol drip - hold coreg for now.  IV labetalol bolus to start  Continue cardene drip .   If the Cardene does not work, you could try Cleviprex drip   She does not take her meds - AT ALL This is a real issue with her and our success if going to be short lived and limited if she does not take some responsibility for her health care.    I have spent a total of 40 minutes with patient reviewing hospital  notes , telemetry, EKGs, labs and examining patient as well as establishing an assessment and plan that was discussed with the patient. > 50% of time was spent in direct patient care.    Thayer Headings, Brooke Bonito., MD, [facility] 08/07/2016, 2:10 PM [address],  [place] Pager [phone]

## 2016-08-07 NOTE — Progress Notes (Signed)
Pt transferred from ED to 4E13 after report.  Pt currently on nitro gtt.  Will continue to monitor.

## 2016-08-07 NOTE — Progress Notes (Signed)
Patient's BP consistently 230-250/115-150. Per parameters, pt's nitro drip should be increased/titrated up but patient is extremely agitated, thrashing in bed, and shouting at the staff while c/o a severe headache. Dr. Lonny Prude notified - new orders received. Attempted to give all AM meds, per new orders, and patient stated she wouldn't take any pills until headache has improved. Educated patient on WHY she has a headache and what we're doing to decrease her BP, which would also decrease her headache. Dr. Lonny Prude notified again. Patient's family member eventually able to convince patient to take PO meds. Dr. Lonny Prude notified. Will turn off nitro drip approx 30 mins after she had received PO meds, per orders.  Joellen Jersey, RN

## 2016-08-07 NOTE — ED Notes (Signed)
Stuck pt. Twice very hard stick.

## 2016-08-07 NOTE — Care Management Note (Signed)
Case Management Note  Patient Details  Name: Kelly Novak MRN: [id number] Date of Birth: 06-Sep-1970  Subjective/Objective:    From home, presents with htn  Urgency, on nitro drip, having headaches, bp going up higher  230-250/115-150.  PTA indep.    PCP Kevan Ny               Action/Plan: NCM will follow for dc needs.   Expected Discharge Date:                  Expected Discharge Plan:     In-House Referral:     Discharge planning Services  CM Consult  Post Acute Care Choice:    Choice offered to:     DME Arranged:    DME Agency:     HH Arranged:    HH Agency:     Status of Service:  In process, will continue to follow  If discussed at Long Length of Stay Meetings, dates discussed:    Additional Comments:  Zenon Mayo, RN 08/07/2016, 10:14 AM

## 2016-08-07 NOTE — Progress Notes (Signed)
Patient seen and examined at bedside, patient admitted after midnight, please see earlier detailed admission note by Ivor Costa, MD. Briefly, patient presented with hypertensive urgency secondary to medication non-adherence. Poor response to nitro drip so will transition to Cardene drip and transfer to ICU. Will consult cardiology. Continue outpatient medications.  Cordelia Poche, MD [facility] 08/07/2016, 12:15 PM Pager: [phone]

## 2016-08-08 DIAGNOSIS — F329 Major depressive disorder, single episode, unspecified: Secondary | ICD-10-CM

## 2016-08-08 DIAGNOSIS — Z8673 Personal history of transient ischemic attack (TIA), and cerebral infarction without residual deficits: Secondary | ICD-10-CM

## 2016-08-08 LAB — IRON AND TIBC
Iron: 51 ug/dL (ref 28–170)
SATURATION RATIOS: 16 % (ref 10.4–31.8)
TIBC: 309 ug/dL (ref 250–450)
UIBC: 258 ug/dL

## 2016-08-08 LAB — VITAMIN B12: VITAMIN B 12: 345 pg/mL (ref 180–914)

## 2016-08-08 LAB — GLUCOSE, CAPILLARY: Glucose-Capillary: 112 mg/dL — ABNORMAL HIGH (ref 65–99)

## 2016-08-08 LAB — RETICULOCYTES
RBC.: 6.47 MIL/uL — ABNORMAL HIGH (ref 3.87–5.11)
Retic Count, Absolute: 129.4 10*3/uL (ref 19.0–186.0)
Retic Ct Pct: 2 % (ref 0.4–3.1)

## 2016-08-08 LAB — FOLATE: FOLATE: 6.7 ng/mL (ref >5.9)

## 2016-08-08 LAB — FERRITIN: Ferritin: 63 ng/mL (ref 11–307)

## 2016-08-08 MED ORDER — CARVEDILOL 25 MG PO TABS
25.0000 mg | ORAL_TABLET | Freq: Two times a day (BID) | ORAL | Status: DC
  Administered 2016-08-08 – 2016-08-09 (×3): 25 mg via ORAL
  Filled 2016-08-08 (×3): qty 1

## 2016-08-08 NOTE — Progress Notes (Signed)
Pt states she has fallen several times in the last six months, bed alarm set, pt compliant   Kelly Novak

## 2016-08-08 NOTE — Progress Notes (Signed)
Progress Note  Patient Name: Kelly Novak Date of Encounter: 08/08/2016  Primary Cardiologist: nahser  Subjective   Feels better. No chest pain or sob.   Inpatient Medications    Scheduled Meds: . aspirin EC  81 mg Oral Daily  . Cariprazine HCl  4.5 mg Oral QHS  . cloNIDine  0.3 mg Oral TID  . enoxaparin (LOVENOX) injection  65 mg Subcutaneous Q24H  . hydrALAZINE  100 mg Oral Q8H  . hydrochlorothiazide  50 mg Oral Daily  . lisinopril  10 mg Oral Daily  . pantoprazole  40 mg Oral Daily  . PARoxetine  20 mg Oral Daily  . sodium chloride flush  3 mL Intravenous Q12H   Continuous Infusions: . labetalol (NORMODYNE) infusion Stopped (08/08/16 0000)  . niCARDipine Stopped (08/08/16 0600)   PRN Meds: acetaminophen **OR** acetaminophen, hydrALAZINE, morphine injection, nitroGLYCERIN, ondansetron, traMADol, zolpidem   Vital Signs    Vitals:   08/08/16 0645 08/08/16 0700 08/08/16 0713 08/08/16 0715  BP: 128/72 119/65  128/69  Pulse: 70 68  74  Resp: 15 15  (!) 21  Temp:   98.2 F (36.8 C)   TempSrc:   Oral   SpO2: 97% 96%  96%  Weight:      Height:        Intake/Output Summary (Last 24 hours) at 08/08/16 0912 Last data filed at 08/08/16 0500  Gross per 24 hour  Intake          1184.58 ml  Output              700 ml  Net           484.58 ml   Filed Weights   08/06/16 2152  Weight: 300 lb (136.1 kg)    Telemetry    nsr - Personally Reviewed  ECG    None  - Personally Reviewed  Physical Exam   GEN: Morbidly obese, No acute distress.   Neck: No JVD Cardiac: RRR, with soft S4, no murmurs, rubs.  Respiratory: Clear to auscultation bilaterally. GI: Soft, obese, nontender, non-distended  MS:  trace peripheral edema; No deformity. Neuro:  Nonfocal  Psych: Normal affect   Labs    Chemistry Recent Labs Lab 08/06/16 2159 08/07/16 0629  NA 139 135  K 3.5 3.5  CL 108 106  CO2 23 21*  GLUCOSE 112* 102*  BUN 8 7  CREATININE 0.91 0.75  CALCIUM 8.5*  8.4*  PROT 6.6  --   ALBUMIN 3.6  --   AST 18  --   ALT 14  --   ALKPHOS 101  --   BILITOT 0.8  --   GFRNONAA >60 >60  GFRAA >60 >60  ANIONGAP 8 8     Hematology Recent Labs Lab 08/06/16 2159 08/07/16 0629  WBC 9.0 10.5  RBC 5.91* 6.24*  HGB 11.5* 11.9*  HCT 37.3 38.7  MCV 63.1* 62.0*  MCH 19.5* 19.1*  MCHC 30.8 30.7  RDW 17.6* 17.5*  PLT 321 312    Cardiac Enzymes Recent Labs Lab 08/07/16 0629 08/07/16 0953 08/07/16 1854  TROPONINI <0.03 <0.03 <0.03    Recent Labs Lab 08/06/16 2232  TROPIPOC 0.01     BNP Recent Labs Lab 08/07/16 0629  BNP 175.1*     DDimer No results for input(s): DDIMER in the last 168 hours.   Radiology    Dg Chest 2 View  Result Date: 08/07/2016 CLINICAL DATA:  High blood pressure with weakness EXAM: CHEST  2 VIEW COMPARISON:  11/03/2015 FINDINGS: Minimal atelectasis at the left base. No focal consolidation or pleural effusion. Stable cardiomediastinal silhouette. No pneumothorax. IMPRESSION: No active cardiopulmonary disease. Electronically Signed   By: Donavan Foil M.D.   On: 08/07/2016 02:44   Ct Head Wo Contrast  Result Date: 08/07/2016 CLINICAL DATA:  Acute onset of high blood pressure. Initial encounter. EXAM: CT HEAD WITHOUT CONTRAST TECHNIQUE: Contiguous axial images were obtained from the base of the skull through the vertex without intravenous contrast. COMPARISON:  CT of the head performed 12/25/2015 FINDINGS: Brain: No evidence of acute infarction, hemorrhage, hydrocephalus, extra-axial collection or mass lesion/mass effect. Chronic infarcts are noted at both sides of the pons, larger on the right. The cerebellum and fourth ventricle are within normal limits. The third and lateral ventricles, and basal ganglia are unremarkable in appearance. The cerebral hemispheres are symmetric in appearance, with normal gray-white differentiation. No mass effect or midline shift is seen. Vascular: No hyperdense vessel or unexpected  calcification. Skull: There is no evidence of fracture; visualized osseous structures are unremarkable in appearance. Sinuses/Orbits: The visualized portions of the orbits are within normal limits. The paranasal sinuses and mastoid air cells are well-aerated. Other: No additional soft tissue abnormalities are seen. IMPRESSION: 1. No acute intracranial pathology seen on CT. 2. Chronic infarcts at both sides of the pons, larger on the right. Electronically Signed   By: Garald Balding M.D.   On: 08/07/2016 00:39    Cardiac Studies   None  Patient Profile     46 y.o. female admitted with hypertensive crisis, who had stopped taking her medications, now improved  Assessment & Plan    1. Hypertensive urgency - she is responding to medical therapy and her blood pressure is nearly normal. We will reinitiate her home medications. She can be transferred to telemetry, and discharged tomorrow if she remains stable. Please restart carvedilol at her previously prescribed dose of 25 mg twice a day. She was also on clonidine 0.3 mg twice daily. 2. Morbid obesity - she is encouraged to lose weight.  3. Chest pain - her chest pain has resolved. 4. Noncompliance - this along with her obesity is her main problem. She is encouraged to take her medications.   Signed, Cristopher Peru, MD  08/08/2016, 9:12 AM  Patient ID: Kelly Novak, female   DOB: 07-May-1970, 46 y.o.   MRN: [id number]

## 2016-08-08 NOTE — Progress Notes (Signed)
PROGRESS NOTE    Kelly Novak  LTJ:[id number] DOB: 01-26-1971 DOA: 08/06/2016 PCP: Benito Mccreedy, MD   Brief Narrative: Kelly Novak is a 46 y.o. female with a history of systolic CHF, stroke, depression, polysubstance abuse, hypertension. She presented with hypertensive urgency secondary to medication non-adherence secondary to inability to afford medications.   Assessment & Plan:   Principal Problem:   Hypertensive urgency Active Problems:   Chest pain   Depression   GERD (gastroesophageal reflux disease)   Abdominal pain   History of stroke   Polysubstance abuse   Chronic systolic (congestive) heart failure (HCC)   Hypertensive urgency Improved with Cardene and labetalol drip which are currently off. Sustaining good blood pressures with oral regimen -cardiology recommendations -continue Coreg, Clonidine, hydralazine, hydrochlorothiazide, lisinopril -CM consult for medication needs  Chest pain ACS ruled out. Resolved soon after admission.  Chronic systolic heart failure Previous echo in 2017 significant for an EF of 45-50% with hypokinesis. Currently euvolemic. -heart healthy diet -outpatient cardiology follow-up  Depression -continue Paxil and cariprzine  GERD -continue Protonix   DVT prophylaxis: Lovenox Code Status: Full code Family Communication: None at bedside Disposition Plan: Discharge in 24 hours   Consultants:   Cardiology  Procedures:   None  Antimicrobials:  None    Subjective: Patient reports resolution of headache. No chest pain or dyspnea. No palpitations.  Objective: Vitals:   08/08/16 1030 08/08/16 1045 08/08/16 1100 08/08/16 1119  BP: 127/74 137/79 134/73   Pulse: 77 81 77   Resp: 16 14 17    Temp:    98.4 F (36.9 C)  TempSrc:    Oral  SpO2: 96% 97% 96%   Weight:      Height:        Intake/Output Summary (Last 24 hours) at 08/08/16 1300 Last data filed at 08/08/16 0500  Gross per 24 hour  Intake           1184.58 ml  Output              500 ml  Net           684.58 ml   Filed Weights   08/06/16 2152  Weight: 136.1 kg (300 lb)    Examination:  General exam: Appears calm and comfortable Respiratory system: Clear to auscultation. Respiratory effort normal. Cardiovascular system: S1 & S2 heard, RRR. No murmurs. Gastrointestinal system: Abdomen is nondistended, soft and nontender. Obese. Normal bowel sounds heard. Central nervous system: Alert and oriented. No focal neurological deficits. Extremities: No edema. No calf tenderness Skin: No cyanosis. No rashes Psychiatry: Judgement and insight appear normal. Mood & affect depressed and flat.     Data Reviewed: I have personally reviewed following labs and imaging studies  CBC:  Recent Labs Lab 08/06/16 2159 08/07/16 0629  WBC 9.0 10.5  NEUTROABS 5.2  --   HGB 11.5* 11.9*  HCT 37.3 38.7  MCV 63.1* 62.0*  PLT 321 076   Basic Metabolic Panel:  Recent Labs Lab 08/06/16 2159 08/07/16 0629  NA 139 135  K 3.5 3.5  CL 108 106  CO2 23 21*  GLUCOSE 112* 102*  BUN 8 7  CREATININE 0.91 0.75  CALCIUM 8.5* 8.4*   GFR: Estimated Creatinine Clearance: 129.5 mL/min (by C-G formula based on SCr of 0.75 mg/dL). Liver Function Tests:  Recent Labs Lab 08/06/16 2159  AST 18  ALT 14  ALKPHOS 101  BILITOT 0.8  PROT 6.6  ALBUMIN 3.6    Recent Labs Lab  08/07/16 0629  LIPASE 22   No results for input(s): AMMONIA in the last 168 hours. Coagulation Profile: No results for input(s): INR, PROTIME in the last 168 hours. Cardiac Enzymes:  Recent Labs Lab 08/07/16 0629 08/07/16 0953 08/07/16 1854  TROPONINI <0.03 <0.03 <0.03   BNP (last 3 results) No results for input(s): PROBNP in the last 8760 hours. HbA1C: No results for input(s): HGBA1C in the last 72 hours. CBG:  Recent Labs Lab 08/08/16 0711  GLUCAP 112*   Lipid Profile: No results for input(s): CHOL, HDL, LDLCALC, TRIG, CHOLHDL, LDLDIRECT in the last 72  hours. Thyroid Function Tests: No results for input(s): TSH, T4TOTAL, FREET4, T3FREE, THYROIDAB in the last 72 hours. Anemia Panel:  Recent Labs  08/08/16 0921  VITAMINB12 345  FOLATE 6.7  FERRITIN 63  TIBC 309  IRON 51  RETICCTPCT 2.0   Sepsis Labs: No results for input(s): PROCALCITON, LATICACIDVEN in the last 168 hours.  Recent Results (from the past 240 hour(s))  MRSA PCR Screening     Status: None   Collection Time: 08/07/16  6:47 AM  Result Value Ref Range Status   MRSA by PCR NEGATIVE NEGATIVE Final    Comment:        The GeneXpert MRSA Assay (FDA approved for NASAL specimens only), is one component of a comprehensive MRSA colonization surveillance program. It is not intended to diagnose MRSA infection nor to guide or monitor treatment for MRSA infections.          Radiology Studies: Dg Chest 2 View  Result Date: 08/07/2016 CLINICAL DATA:  High blood pressure with weakness EXAM: CHEST  2 VIEW COMPARISON:  11/03/2015 FINDINGS: Minimal atelectasis at the left base. No focal consolidation or pleural effusion. Stable cardiomediastinal silhouette. No pneumothorax. IMPRESSION: No active cardiopulmonary disease. Electronically Signed   By: Donavan Foil M.D.   On: 08/07/2016 02:44   Ct Head Wo Contrast  Result Date: 08/07/2016 CLINICAL DATA:  Acute onset of high blood pressure. Initial encounter. EXAM: CT HEAD WITHOUT CONTRAST TECHNIQUE: Contiguous axial images were obtained from the base of the skull through the vertex without intravenous contrast. COMPARISON:  CT of the head performed 12/25/2015 FINDINGS: Brain: No evidence of acute infarction, hemorrhage, hydrocephalus, extra-axial collection or mass lesion/mass effect. Chronic infarcts are noted at both sides of the pons, larger on the right. The cerebellum and fourth ventricle are within normal limits. The third and lateral ventricles, and basal ganglia are unremarkable in appearance. The cerebral hemispheres are  symmetric in appearance, with normal gray-white differentiation. No mass effect or midline shift is seen. Vascular: No hyperdense vessel or unexpected calcification. Skull: There is no evidence of fracture; visualized osseous structures are unremarkable in appearance. Sinuses/Orbits: The visualized portions of the orbits are within normal limits. The paranasal sinuses and mastoid air cells are well-aerated. Other: No additional soft tissue abnormalities are seen. IMPRESSION: 1. No acute intracranial pathology seen on CT. 2. Chronic infarcts at both sides of the pons, larger on the right. Electronically Signed   By: Garald Balding M.D.   On: 08/07/2016 00:39        Scheduled Meds: . aspirin EC  81 mg Oral Daily  . Cariprazine HCl  4.5 mg Oral QHS  . cloNIDine  0.3 mg Oral TID  . enoxaparin (LOVENOX) injection  65 mg Subcutaneous Q24H  . hydrALAZINE  100 mg Oral Q8H  . hydrochlorothiazide  50 mg Oral Daily  . lisinopril  10 mg Oral Daily  . pantoprazole  40 mg Oral Daily  . PARoxetine  20 mg Oral Daily  . sodium chloride flush  3 mL Intravenous Q12H   Continuous Infusions: . labetalol (NORMODYNE) infusion Stopped (08/08/16 0000)  . niCARDipine Stopped (08/08/16 0600)     LOS: 1 day     Cordelia Poche, MD [facility] 08/08/2016, 1:00 PM Pager: [phone]  If 7PM-7AM, please contact night-coverage [email or web address] Password [email or web address] 08/08/2016, 1:00 PM

## 2016-08-08 NOTE — Progress Notes (Signed)
Pt did not eat her dinner, second tray ordered  Kelly Novak

## 2016-08-08 NOTE — Progress Notes (Signed)
Left arm iv reddened.  New iv placed right arm.  As left arm occlusive dsg being d/c'd, skin was pulling up with dsg.  Warm cloth applied to rest of dsg as to lift dsg without skin.  Allevyn dsg applied.

## 2016-08-09 DIAGNOSIS — R079 Chest pain, unspecified: Secondary | ICD-10-CM

## 2016-08-09 LAB — GLUCOSE, CAPILLARY: GLUCOSE-CAPILLARY: 118 mg/dL — AB (ref 65–99)

## 2016-08-09 MED ORDER — LISINOPRIL 10 MG PO TABS: 10.0000 mg | ORAL_TABLET | Freq: Every day | ORAL | 0 refills | Status: DC

## 2016-08-09 MED ORDER — CLONIDINE HCL 0.3 MG PO TABS
0.3000 mg | ORAL_TABLET | Freq: Three times a day (TID) | ORAL | 0 refills | Status: DC
Start: 2016-08-09 — End: 2016-09-17

## 2016-08-09 MED ORDER — CARVEDILOL 25 MG PO TABS: 25.0000 mg | ORAL_TABLET | Freq: Two times a day (BID) | ORAL | 0 refills | Status: DC

## 2016-08-09 MED ORDER — CARIPRAZINE HCL 4.5 MG PO CAPS: 4.5000 mg | ORAL_CAPSULE | Freq: Every day | ORAL | 0 refills | Status: DC

## 2016-08-09 MED ORDER — HYDRALAZINE HCL 100 MG PO TABS: 100.0000 mg | ORAL_TABLET | Freq: Three times a day (TID) | ORAL | 0 refills | Status: DC

## 2016-08-09 MED ORDER — HYDROCHLOROTHIAZIDE 50 MG PO TABS: 50.0000 mg | ORAL_TABLET | Freq: Every day | ORAL | 0 refills | Status: DC

## 2016-08-09 MED ORDER — PAROXETINE HCL 20 MG PO TABS: 20.0000 mg | ORAL_TABLET | Freq: Every day | ORAL | 0 refills | Status: DC

## 2016-08-09 NOTE — Discharge Instructions (Addendum)

## 2016-08-09 NOTE — Progress Notes (Signed)
CM spoke with pt who [facility] Marlou Sa.  Pt states she has medications and the prescriptions given are for meds she already has.  CM has texted MD to make aware.  No other Cm needs were communicated.

## 2016-08-09 NOTE — Progress Notes (Signed)
Case management made aware  of consult regarding medication needs

## 2016-08-09 NOTE — Discharge Summary (Signed)
Physician Discharge Summary  Kelly Novak RSW:[id number] DOB: 1970-09-10 DOA: 08/06/2016  PCP: Benito Mccreedy, MD  Admit date: 08/06/2016 Discharge date: 08/09/2016  Admitted From: Home Disposition: Home  Recommendations for Outpatient Follow-up:  1. Follow up with PCP in 1 week 2. Recommend evaluation for secondary hypertension unless able to wean do blood pressure medications  Home Health: None Equipment/Devices: None  Discharge Condition: Stable CODE STATUS: Full code Diet recommendation: Heart healthy   Brief/Interim Summary:  Admission HPI written by Ivor Costa, MD   Patient coming from:  The patient is coming from home.  At baseline, pt is independent for most of ADL.   Chief Complaint: chest pain and elevated blood pressure  HPI: Kelly Novak is a 46 y.o. female with medical history significant of medication noncompliance, hypertension, GERD, depression, stroke, morbid obesity, bipolar disorder, chronic back pain, sickle cell trait, polysubstance abuse (tobacco, marijuana, cocaine, amphetamine), who presents with chest pain and elevated blood pressure.  Patient states that she ran out of her blood pressure medications yesterday. Her blood pressure elevated. She measured her blood pressure at home which was 093 systolically. She reports chest pain, which is located in the substernal area, 10 out of 10 in severity, nonradiating. She has mild cough, but does not have shortness of breath, fever or chills. Patient states that she does not have nausea, vomiting, diarrhea, abdominal pain at home, but she started having abdominal pain after she was given medication treatment in ED. Her abdominal pain is located in the central abdomen, moderate, nonradiating. Still does not have nausea, vomiting or diarrhea. Denies symptoms of UTI or unilateral weakness.  ED Course: pt was found to have blood pressure 238/120, negative troponin, WBC 9.0, creatinine normal, temperature 99.8,  negative chest x-ray, negative CT head for acute intracranial abnormalities. Patient received one dose of nitroglycerin patch 15 mg, 10 mg of hydralazine IV, clonidine 0.3 orally, but her blood pressure is still elevated at 214/121. Pt is placed on SDU for obs.     Hospital course:  Hypertensive urgency Patient initially managed with nitro drip and when necessary hydralazine IV. No improvement in hypertension. Patient was subsequently transitioned from nitro drip to Cardene drip. Cardiology was consulted and they started a labetalol drip in addition to Cardene. Home medications were started as well. Blood pressure significant improved and Cardene/labetalol drips were discontinued. Patient sustained well-controlled blood pressures with home regimen of antihypertensives. Continue Coreg, clonidine, hydralazine, HCTZ, lisinopril at discharge. Per patient, it appears she was not adherent with regimen secondary to finances. Case management was consulted for help with medications.  Chest pain ACS ruled out. Resolved soon after admission.  Chronic systolic heart failure Previous echo in 2017 significant for an EF of 45-50% with hypokinesis. Currently euvolemic. Recommend outpatient cardiology follow-up  Depression Continued Paxil and cariprzine  GERD Continued PPI. Recommend discontinuing as an outpatient unless needed.  Discharge Diagnoses:  Principal Problem:   Hypertensive urgency Active Problems:   Chest pain   Depression   GERD (gastroesophageal reflux disease)   Abdominal pain   History of stroke   Polysubstance abuse   Chronic systolic (congestive) heart failure [facility])    Discharge Instructions  Discharge Instructions    Call MD for:  difficulty breathing, headache or visual disturbances    Complete by:  As directed    Call MD for:  persistant dizziness or light-headedness    Complete by:  As directed    Call MD for:  persistant nausea and vomiting  Complete by:  As  directed    Call MD for:  severe uncontrolled pain    Complete by:  As directed    Diet - low sodium heart healthy    Complete by:  As directed    Increase activity slowly    Complete by:  As directed      Allergies as of 08/09/2016   No Known Allergies     Medication List    STOP taking these medications   ibuprofen 800 MG tablet Commonly known as:  ADVIL,MOTRIN   omeprazole 20 MG capsule Commonly known as:  PRILOSEC     TAKE these medications   aspirin 81 MG EC tablet Take 1 tablet (81 mg total) by mouth daily.   Cariprazine HCl 4.5 MG Caps Commonly known as:  VRAYLAR Take 1 capsule (4.5 mg total) by mouth at bedtime.   carvedilol 25 MG tablet Commonly known as:  COREG Take 1 tablet (25 mg total) by mouth 2 (two) times daily with a meal.   cloNIDine 0.3 MG tablet Commonly known as:  CATAPRES Take 1 tablet (0.3 mg total) by mouth 3 (three) times daily. What changed:  Another medication with the same name was removed. Continue taking this medication, and follow the directions you see here.   hydrALAZINE 100 MG tablet Commonly known as:  APRESOLINE Take 1 tablet (100 mg total) by mouth every 8 (eight) hours.   hydrochlorothiazide 50 MG tablet Commonly known as:  HYDRODIURIL Take 1 tablet (50 mg total) by mouth daily.   lisinopril 10 MG tablet Commonly known as:  PRINIVIL,ZESTRIL Take 1 tablet (10 mg total) by mouth daily.   nitroGLYCERIN 0.4 MG SL tablet Commonly known as:  NITROSTAT Place 1 tablet (0.4 mg total) under the tongue every 5 (five) minutes as needed for chest pain.   PARoxetine 20 MG tablet Commonly known as:  PAXIL Take 1 tablet (20 mg total) by mouth daily.   traMADol 50 MG tablet Commonly known as:  ULTRAM Take 1 tablet (50 mg total) by mouth every 6 (six) hours as needed. What changed:  reasons to take this      Follow-up Information    Osei-Bonsu, Iona Beard, MD. Schedule an appointment as soon as possible for a visit in 1 week(s).    Specialty:  Internal Medicine Contact information: [address] [place] [address] [phone]          No Known Allergies  Consultations:  Cardiology   Procedures/Studies: Dg Chest 2 View  Result Date: 08/07/2016 CLINICAL DATA:  High blood pressure with weakness EXAM: CHEST  2 VIEW COMPARISON:  11/03/2015 FINDINGS: Minimal atelectasis at the left base. No focal consolidation or pleural effusion. Stable cardiomediastinal silhouette. No pneumothorax. IMPRESSION: No active cardiopulmonary disease. Electronically Signed   By: Donavan Foil M.D.   On: 08/07/2016 02:44   Ct Head Wo Contrast  Result Date: 08/07/2016 CLINICAL DATA:  Acute onset of high blood pressure. Initial encounter. EXAM: CT HEAD WITHOUT CONTRAST TECHNIQUE: Contiguous axial images were obtained from the base of the skull through the vertex without intravenous contrast. COMPARISON:  CT of the head performed 12/25/2015 FINDINGS: Brain: No evidence of acute infarction, hemorrhage, hydrocephalus, extra-axial collection or mass lesion/mass effect. Chronic infarcts are noted at both sides of the pons, larger on the right. The cerebellum and fourth ventricle are within normal limits. The third and lateral ventricles, and basal ganglia are unremarkable in appearance. The cerebral hemispheres are symmetric in appearance, with  normal gray-white differentiation. No mass effect or midline shift is seen. Vascular: No hyperdense vessel or unexpected calcification. Skull: There is no evidence of fracture; visualized osseous structures are unremarkable in appearance. Sinuses/Orbits: The visualized portions of the orbits are within normal limits. The paranasal sinuses and mastoid air cells are well-aerated. Other: No additional soft tissue abnormalities are seen. IMPRESSION: 1. No acute intracranial pathology seen on CT. 2. Chronic infarcts at both sides of the pons, larger on the right. Electronically Signed   By: Garald Balding M.D.   On: 08/07/2016 00:39       Subjective: Patient reports no headache, chest pain, dyspnea.  Discharge Exam: Vitals:   08/09/16 1235 08/09/16 1629  BP: (!) 93/52 124/74  Pulse: 70   Resp: 20   Temp: 98.2 F (36.8 C)    Vitals:   08/09/16 0530 08/09/16 0822 08/09/16 1235 08/09/16 1629  BP: (!) 117/52 119/64 (!) 93/52 124/74  Pulse: 75  70   Resp: 18  20   Temp: 98.3 F (36.8 C)  98.2 F (36.8 C)   TempSrc: Oral  Oral   SpO2: 96%  93%   Weight: 135.5 kg (298 lb 12.8 oz)     Height:        General: Pt is alert, awake, not in acute distress Cardiovascular: RRR, S1/S2 +, no rubs, no gallops Respiratory: CTA bilaterally, no wheezing, no rhonchi Abdominal: Soft, NT, ND, bowel sounds + Extremities: no edema, no cyanosis    The results of significant diagnostics from this hospitalization (including imaging, microbiology, ancillary and laboratory) are listed below for reference.     Microbiology: Recent Results (from the past 240 hour(s))  MRSA PCR Screening     Status: None   Collection Time: 08/07/16  6:47 AM  Result Value Ref Range Status   MRSA by PCR NEGATIVE NEGATIVE Final    Comment:        The GeneXpert MRSA Assay (FDA approved for NASAL specimens only), is one component of a comprehensive MRSA colonization surveillance program. It is not intended to diagnose MRSA infection nor to guide or monitor treatment for MRSA infections.      Labs: BNP (last 3 results)  Recent Labs  08/07/16 0629  BNP 732.2*   Basic Metabolic Panel:  Recent Labs Lab 08/06/16 2159 08/07/16 0629  NA 139 135  K 3.5 3.5  CL 108 106  CO2 23 21*  GLUCOSE 112* 102*  BUN 8 7  CREATININE 0.91 0.75  CALCIUM 8.5* 8.4*   Liver Function Tests:  Recent Labs Lab 08/06/16 2159  AST 18  ALT 14  ALKPHOS 101  BILITOT 0.8  PROT 6.6  ALBUMIN 3.6    Recent Labs Lab 08/07/16 0629  LIPASE 22   No results for input(s): AMMONIA in the last 168  hours. CBC:  Recent Labs Lab 08/06/16 2159 08/07/16 0629  WBC 9.0 10.5  NEUTROABS 5.2  --   HGB 11.5* 11.9*  HCT 37.3 38.7  MCV 63.1* 62.0*  PLT 321 312   Cardiac Enzymes:  Recent Labs Lab 08/07/16 0629 08/07/16 0953 08/07/16 1854  TROPONINI <0.03 <0.03 <0.03   BNP: Invalid input(s): POCBNP CBG:  Recent Labs Lab 08/08/16 0711 08/09/16 0700  GLUCAP 112* 118*   D-Dimer No results for input(s): DDIMER in the last 72 hours. Hgb A1c No results for input(s): HGBA1C in the last 72 hours. Lipid Profile No results for input(s): CHOL, HDL, LDLCALC, TRIG, CHOLHDL, LDLDIRECT in the last 72 hours.  Thyroid function studies No results for input(s): TSH, T4TOTAL, T3FREE, THYROIDAB in the last 72 hours.  Invalid input(s): FREET3 Anemia work up  Recent Labs  08/08/16 0921  VITAMINB12 345  FOLATE 6.7  FERRITIN 63  TIBC 309  IRON 51  RETICCTPCT 2.0   Urinalysis    Component Value Date/Time   COLORURINE AMBER (A) 12/25/2015 2044   APPEARANCEUR CLOUDY (A) 12/25/2015 2044   LABSPEC 1.031 (H) 12/25/2015 2044   PHURINE 6.5 12/25/2015 2044   GLUCOSEU NEGATIVE 12/25/2015 2044   HGBUR NEGATIVE 12/25/2015 2044   BILIRUBINUR NEGATIVE 12/25/2015 2044   KETONESUR NEGATIVE 12/25/2015 2044   PROTEINUR 30 (A) 12/25/2015 2044   UROBILINOGEN 0.2 06/06/2013 1815   NITRITE NEGATIVE 12/25/2015 2044   LEUKOCYTESUR NEGATIVE 12/25/2015 2044   Sepsis Labs Invalid input(s): PROCALCITONIN,  WBC,  LACTICIDVEN Microbiology Recent Results (from the past 240 hour(s))  MRSA PCR Screening     Status: None   Collection Time: 08/07/16  6:47 AM  Result Value Ref Range Status   MRSA by PCR NEGATIVE NEGATIVE Final    Comment:        The GeneXpert MRSA Assay (FDA approved for NASAL specimens only), is one component of a comprehensive MRSA colonization surveillance program. It is not intended to diagnose MRSA infection nor to guide or monitor treatment for MRSA infections.      Time  coordinating discharge: Over 30 minutes  SIGNED:   Cordelia Poche, MD [facility] 08/09/2016, 5:10 PM Pager [phone]  If 7PM-7AM, please contact night-coverage [email or web address] Password [email or web address]

## 2016-08-09 NOTE — Progress Notes (Signed)
Changed dressing on left arm, Vaseline gauze with Allevyn foam.

## 2016-08-25 NOTE — Progress Notes (Deleted)
Cardiology Office Note Date:  08/25/2016  Patient ID:  Kelly Novak, Kelly Novak 1971/02/13, MRN [id number] PCP:  Benito Mccreedy, MD  Cardiologist:  Dr. Acie Fredrickson  ***refresh   Chief Complaint: post hospital visit  History of Present Illness: Kelly Novak is a 46 y.o. female with history of HTN, GERD, CVA, bipolar d/o, morbid obesity, substance abuse (tobacco, marijuana, cocaine, amphetamine), CBP, and known medication non-compliance.    She was hospitalized at [facility] admitted 08/06/16 with HTN urgency off her medicines with c/o headache and CP requiring ICU for cardene gtt and IV labetalol.  The patient reported she had no refills, though was noted that she did  And had not picked up the medicines.  Her BP improved and her symptoms resolved.  Her Trop I were neg x3, no ischemic w/u was persued and instructed to d/c on her established home meds.  Tox screen + only for opiates (Sept 2017 was + for cocaine, opiates, benzos, and marijuna)  *** Meds, compliance *** substrance abuse? *** counsel    Past Medical History:  Diagnosis Date  . Anemia   . Anxiety   . Arthritis   . Bipolar 1 disorder ([place])   . Blood dyscrasia    sickle cell trait  . Chronic back pain   . Chronic systolic (congestive) heart failure ([place])   . GERD (gastroesophageal reflux disease)   . Hypertension   . Obesity   . Polysubstance abuse   . Shortness of breath    on exertion due to weight  . Stroke [facility])     Past Surgical History:  Procedure Laterality Date  . CESAREAN SECTION    . CHOLECYSTECTOMY    . HYSTEROSCOPY N/A 08/23/2013   Procedure: HYSTEROSCOPY D&C WITH HYDROTHERMAL ABLATION;  Surgeon: Osborne Oman, MD;  Location: [facility];  Service: Gynecology;  Laterality: N/A;  . TUBAL LIGATION      Current Outpatient Prescriptions  Medication Sig Dispense Refill  . aspirin EC 81 MG EC tablet Take 1 tablet (81 mg total) by mouth daily. (Patient not taking: Reported on 08/06/2016) 30 tablet 0  . Cariprazine HCl  (VRAYLAR) 4.5 MG CAPS Take 1 capsule (4.5 mg total) by mouth at bedtime. 30 capsule 0  . carvedilol (COREG) 25 MG tablet Take 1 tablet (25 mg total) by mouth 2 (two) times daily with a meal. 60 tablet 0  . cloNIDine (CATAPRES) 0.3 MG tablet Take 1 tablet (0.3 mg total) by mouth 3 (three) times daily. 90 tablet 0  . hydrALAZINE (APRESOLINE) 100 MG tablet Take 1 tablet (100 mg total) by mouth every 8 (eight) hours. 90 tablet 0  . hydrochlorothiazide (HYDRODIURIL) 50 MG tablet Take 1 tablet (50 mg total) by mouth daily. 30 tablet 0  . lisinopril (PRINIVIL,ZESTRIL) 10 MG tablet Take 1 tablet (10 mg total) by mouth daily. 30 tablet 0  . nitroGLYCERIN (NITROSTAT) 0.4 MG SL tablet Place 1 tablet (0.4 mg total) under the tongue every 5 (five) minutes as needed for chest pain. 30 tablet 0  . PARoxetine (PAXIL) 20 MG tablet Take 1 tablet (20 mg total) by mouth daily. 30 tablet 0  . traMADol (ULTRAM) 50 MG tablet Take 1 tablet (50 mg total) by mouth every 6 (six) hours as needed. (Patient taking differently: Take 50 mg by mouth every 6 (six) hours as needed for moderate pain. ) 15 tablet 0   No current facility-administered medications for this visit.     Allergies:   Patient has no known  allergies.   Social History:  The patient  reports that she has quit smoking. Her smoking use included Cigarettes. She smoked 0.00 packs per day for 14.00 years. She has never used smokeless tobacco. She reports that she does not drink alcohol or use drugs.   Family History:  The patient's family history includes CAD in her father and paternal aunt; Heart failure in her brother; Hypertension in her brother.  ROS:  Please see the history of present illness.  All other systems are reviewed and otherwise negative.   PHYSICAL EXAM: *** VS:  LMP 08/06/2013  BMI: There is no height or weight on file to calculate BMI. Well nourished, well developed, in no acute distress  HEENT: normocephalic, atraumatic  Neck: no JVD,  carotid bruits or masses Cardiac:  *** RRR; no significant murmurs, no rubs, or gallops Lungs:  *** CTA b/l, no wheezing, rhonchi or rales  Abd: soft, nontender MS: no deformity or atrophy Ext: *** no edema  Skin: warm and dry, no rash Neuro:  No gross deficits appreciated Psych: euthymic mood, full affect    11/04/15: TTE - Procedure narrative: Transthoracic echocardiography. Image quality was adequate. The study was technically difficult. - Left ventricle: The cavity size was normal. There was severe concentric hypertrophy. Systolic function was mildly reduced. The estimated ejection fraction was in the range of 45% to 50%. Mild diffuse hypokinesis with no identifiable regional variations. Although no diagnostic regional wall motion abnormality was identified, this possibility cannot be completely excluded on the basis of this study. Doppler parameters are consistent with abnormal left ventricular relaxation (grade 1 diastolic dysfunction). Indeterminate left atrial filling pressure. - Mitral valve: Calcified annulus.  ECG:  08/06/2016 SR, diffuse T wave changes compared to 12/2015, may be 2nd LVH   Recent Labs: 11/05/2015: Magnesium 2.0 08/06/2016: ALT 14 08/07/2016: B Natriuretic Peptide 175.1; BUN 7; Creatinine, Ser 0.75; Hemoglobin 11.9; Platelets 312; Potassium 3.5; Sodium 135  11/04/2015: Cholesterol 196; HDL 47; LDL Cholesterol 99; Total CHOL/HDL Ratio 4.2; Triglycerides 249; VLDL 50   CrCl cannot be calculated (Unknown ideal weight.).   Wt Readings from Last 3 Encounters:  08/09/16 298 lb 12.8 oz (135.5 kg)  12/25/15 300 lb (136.1 kg)  11/03/15 (!) 316 lb 9.6 oz (143.6 kg)     Other studies reviewed: Additional studies/records reviewed today include: summarized above  ASSESSMENT AND PLAN:  1. HTN     ***  2. Non-compliance     *** counseled  3. Substance abuse     *** counseled  Disposition: F/u with ***  Current medicines are  reviewed at length with the patient today.  The patient did not have any concerns regarding medicines.***  Signed, Jennings Books, PA-C 08/25/2016 8:04 AM     [facility] [place] [address] [phone] (office)  [phone] (fax)

## 2016-08-26 ENCOUNTER — Emergency Department (HOSPITAL_COMMUNITY)
Admission: EM | Admit: 2016-08-26 | Discharge: 2016-08-26 | Disposition: A | Discharge status: Home / Self Care | Payer: No Typology Code available for payment source | Attending: Emergency Medicine | Admitting: Emergency Medicine

## 2016-08-26 ENCOUNTER — Emergency Department (HOSPITAL_COMMUNITY): Payer: No Typology Code available for payment source

## 2016-08-26 ENCOUNTER — Encounter (HOSPITAL_COMMUNITY): Payer: Self-pay | Admitting: Pharmacy Technician

## 2016-08-26 DIAGNOSIS — Y929 Unspecified place or not applicable: Secondary | ICD-10-CM | POA: Insufficient documentation

## 2016-08-26 DIAGNOSIS — Y999 Unspecified external cause status: Secondary | ICD-10-CM | POA: Insufficient documentation

## 2016-08-26 DIAGNOSIS — Z79899 Other long term (current) drug therapy: Secondary | ICD-10-CM | POA: Insufficient documentation

## 2016-08-26 DIAGNOSIS — Z8673 Personal history of transient ischemic attack (TIA), and cerebral infarction without residual deficits: Secondary | ICD-10-CM | POA: Insufficient documentation

## 2016-08-26 DIAGNOSIS — Z7982 Long term (current) use of aspirin: Secondary | ICD-10-CM | POA: Insufficient documentation

## 2016-08-26 DIAGNOSIS — Y939 Activity, unspecified: Secondary | ICD-10-CM | POA: Diagnosis not present

## 2016-08-26 DIAGNOSIS — S8991XA Unspecified injury of right lower leg, initial encounter: Secondary | ICD-10-CM | POA: Diagnosis present

## 2016-08-26 DIAGNOSIS — I110 Hypertensive heart disease with heart failure: Secondary | ICD-10-CM | POA: Insufficient documentation

## 2016-08-26 DIAGNOSIS — Z87891 Personal history of nicotine dependence: Secondary | ICD-10-CM | POA: Insufficient documentation

## 2016-08-26 DIAGNOSIS — I5022 Chronic systolic (congestive) heart failure: Secondary | ICD-10-CM | POA: Diagnosis not present

## 2016-08-26 DIAGNOSIS — S8011XA Contusion of right lower leg, initial encounter: Secondary | ICD-10-CM

## 2016-08-26 DIAGNOSIS — I10 Essential (primary) hypertension: Secondary | ICD-10-CM

## 2016-08-26 MED ORDER — ONDANSETRON HCL 4 MG/2ML IJ SOLN
4.0000 mg | Freq: Once | INTRAMUSCULAR | Status: AC
  Administered 2016-08-26: 4 mg via INTRAVENOUS
  Filled 2016-08-26: qty 2

## 2016-08-26 MED ORDER — HYDROMORPHONE HCL 1 MG/ML IJ SOLN
1.0000 mg | Freq: Once | INTRAMUSCULAR | Status: AC
  Administered 2016-08-26: 1 mg via INTRAVENOUS
  Filled 2016-08-26: qty 1

## 2016-08-26 MED ORDER — TRAMADOL HCL 50 MG PO TABS: 50.0000 mg | ORAL_TABLET | Freq: Four times a day (QID) | ORAL | 0 refills | Status: DC | PRN

## 2016-08-26 MED ORDER — MELOXICAM 15 MG PO TABS: 15.0000 mg | ORAL_TABLET | Freq: Every day | ORAL | 0 refills | Status: DC

## 2016-08-26 MED ORDER — OXYCODONE-ACETAMINOPHEN 5-325 MG PO TABS
1.0000 | ORAL_TABLET | Freq: Once | ORAL | Status: AC
  Administered 2016-08-26: 1 via ORAL
  Filled 2016-08-26: qty 1

## 2016-08-26 MED ORDER — HYDRALAZINE HCL 20 MG/ML IJ SOLN
20.0000 mg | Freq: Once | INTRAMUSCULAR | Status: AC
  Administered 2016-08-26: 20 mg via INTRAVENOUS
  Filled 2016-08-26: qty 1
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
## 2016-08-26 NOTE — ED Notes (Signed)
Patient transported to X-ray 

## 2016-08-27 ENCOUNTER — Ambulatory Visit: Payer: Medicaid Other | Admitting: Physician Assistant

## 2016-08-28 ENCOUNTER — Encounter: Payer: Self-pay | Admitting: Physician Assistant

## 2016-08-31 ENCOUNTER — Inpatient Hospital Stay (HOSPITAL_COMMUNITY)
Admission: EM | Admit: 2016-08-31 | Discharge: 2016-09-10 | DRG: 475 | Disposition: A | Discharge status: Home / Self Care | Payer: Medicare Other | Attending: Internal Medicine | Admitting: Internal Medicine

## 2016-08-31 ENCOUNTER — Emergency Department (HOSPITAL_COMMUNITY): Payer: Medicare Other

## 2016-08-31 ENCOUNTER — Encounter (HOSPITAL_COMMUNITY): Payer: Self-pay | Admitting: Emergency Medicine

## 2016-08-31 DIAGNOSIS — Z01818 Encounter for other preprocedural examination: Secondary | ICD-10-CM

## 2016-08-31 DIAGNOSIS — Z7982 Long term (current) use of aspirin: Secondary | ICD-10-CM

## 2016-08-31 DIAGNOSIS — Z8673 Personal history of transient ischemic attack (TIA), and cerebral infarction without residual deficits: Secondary | ICD-10-CM

## 2016-08-31 DIAGNOSIS — Z9889 Other specified postprocedural states: Secondary | ICD-10-CM | POA: Diagnosis not present

## 2016-08-31 DIAGNOSIS — Z6841 Body Mass Index (BMI) 40.0 and over, adult: Secondary | ICD-10-CM | POA: Diagnosis not present

## 2016-08-31 DIAGNOSIS — N179 Acute kidney failure, unspecified: Secondary | ICD-10-CM | POA: Diagnosis present

## 2016-08-31 DIAGNOSIS — I110 Hypertensive heart disease with heart failure: Secondary | ICD-10-CM | POA: Diagnosis present

## 2016-08-31 DIAGNOSIS — D638 Anemia in other chronic diseases classified elsewhere: Secondary | ICD-10-CM | POA: Diagnosis present

## 2016-08-31 DIAGNOSIS — T148XXA Other injury of unspecified body region, initial encounter: Secondary | ICD-10-CM | POA: Diagnosis not present

## 2016-08-31 DIAGNOSIS — F319 Bipolar disorder, unspecified: Secondary | ICD-10-CM | POA: Diagnosis present

## 2016-08-31 DIAGNOSIS — E871 Hypo-osmolality and hyponatremia: Secondary | ICD-10-CM | POA: Diagnosis present

## 2016-08-31 DIAGNOSIS — D649 Anemia, unspecified: Secondary | ICD-10-CM | POA: Diagnosis present

## 2016-08-31 DIAGNOSIS — Z9049 Acquired absence of other specified parts of digestive tract: Secondary | ICD-10-CM

## 2016-08-31 DIAGNOSIS — T502X5A Adverse effect of carbonic-anhydrase inhibitors, benzothiadiazides and other diuretics, initial encounter: Secondary | ICD-10-CM | POA: Diagnosis present

## 2016-08-31 DIAGNOSIS — F419 Anxiety disorder, unspecified: Secondary | ICD-10-CM | POA: Diagnosis present

## 2016-08-31 DIAGNOSIS — Z23 Encounter for immunization: Secondary | ICD-10-CM

## 2016-08-31 DIAGNOSIS — S8991XA Unspecified injury of right lower leg, initial encounter: Secondary | ICD-10-CM | POA: Diagnosis not present

## 2016-08-31 DIAGNOSIS — F1910 Other psychoactive substance abuse, uncomplicated: Secondary | ICD-10-CM | POA: Diagnosis present

## 2016-08-31 DIAGNOSIS — D62 Acute posthemorrhagic anemia: Secondary | ICD-10-CM | POA: Diagnosis not present

## 2016-08-31 DIAGNOSIS — R238 Other skin changes: Secondary | ICD-10-CM | POA: Diagnosis present

## 2016-08-31 DIAGNOSIS — I5022 Chronic systolic (congestive) heart failure: Secondary | ICD-10-CM

## 2016-08-31 DIAGNOSIS — F17200 Nicotine dependence, unspecified, uncomplicated: Secondary | ICD-10-CM | POA: Diagnosis present

## 2016-08-31 DIAGNOSIS — M726 Necrotizing fasciitis: Secondary | ICD-10-CM | POA: Diagnosis present

## 2016-08-31 DIAGNOSIS — K219 Gastro-esophageal reflux disease without esophagitis: Secondary | ICD-10-CM

## 2016-08-31 DIAGNOSIS — D573 Sickle-cell trait: Secondary | ICD-10-CM | POA: Diagnosis present

## 2016-08-31 DIAGNOSIS — S8990XA Unspecified injury of unspecified lower leg, initial encounter: Secondary | ICD-10-CM

## 2016-08-31 DIAGNOSIS — I96 Gangrene, not elsewhere classified: Secondary | ICD-10-CM | POA: Diagnosis present

## 2016-08-31 DIAGNOSIS — L03115 Cellulitis of right lower limb: Secondary | ICD-10-CM

## 2016-08-31 DIAGNOSIS — S8781XS Crushing injury of right lower leg, sequela: Secondary | ICD-10-CM

## 2016-08-31 DIAGNOSIS — M549 Dorsalgia, unspecified: Secondary | ICD-10-CM | POA: Diagnosis present

## 2016-08-31 DIAGNOSIS — G8929 Other chronic pain: Secondary | ICD-10-CM | POA: Diagnosis present

## 2016-08-31 DIAGNOSIS — Z9119 Patient's noncompliance with other medical treatment and regimen: Secondary | ICD-10-CM

## 2016-08-31 DIAGNOSIS — L089 Local infection of the skin and subcutaneous tissue, unspecified: Secondary | ICD-10-CM | POA: Diagnosis not present

## 2016-08-31 DIAGNOSIS — Z8249 Family history of ischemic heart disease and other diseases of the circulatory system: Secondary | ICD-10-CM

## 2016-08-31 DIAGNOSIS — L0390 Cellulitis, unspecified: Secondary | ICD-10-CM

## 2016-08-31 DIAGNOSIS — D509 Iron deficiency anemia, unspecified: Secondary | ICD-10-CM | POA: Diagnosis present

## 2016-08-31 DIAGNOSIS — Z79899 Other long term (current) drug therapy: Secondary | ICD-10-CM

## 2016-08-31 DIAGNOSIS — F32A Depression, unspecified: Secondary | ICD-10-CM | POA: Diagnosis present

## 2016-08-31 DIAGNOSIS — Z9851 Tubal ligation status: Secondary | ICD-10-CM | POA: Diagnosis not present

## 2016-08-31 DIAGNOSIS — F329 Major depressive disorder, single episode, unspecified: Secondary | ICD-10-CM | POA: Diagnosis present

## 2016-08-31 LAB — ETHANOL: Alcohol, Ethyl (B): <5 mg/dL (ref <5)

## 2016-08-31 LAB — URINALYSIS, ROUTINE W REFLEX MICROSCOPIC
BILIRUBIN URINE: NEGATIVE
GLUCOSE, UA: NEGATIVE mg/dL
HGB URINE DIPSTICK: NEGATIVE
Ketones, ur: NEGATIVE mg/dL
Leukocytes, UA: NEGATIVE
Nitrite: NEGATIVE
Protein, ur: NEGATIVE mg/dL
SPECIFIC GRAVITY, URINE: 1.012 (ref 1.005–1.030)
pH: 6 (ref 5.0–8.0)

## 2016-08-31 LAB — CBC WITH DIFFERENTIAL/PLATELET
BASOS ABS: 0 10*3/uL (ref 0.0–0.1)
Basophils Relative: 0 %
Eosinophils Absolute: 0 10*3/uL (ref 0.0–0.7)
Eosinophils Relative: 0 %
HEMATOCRIT: 26.3 % — AB (ref 36.0–46.0)
Hemoglobin: 8.1 g/dL — ABNORMAL LOW (ref 12.0–15.0)
LYMPHS PCT: 12 %
Lymphs Abs: 2 10*3/uL (ref 0.7–4.0)
MCH: 19.4 pg — ABNORMAL LOW (ref 26.0–34.0)
MCHC: 30.8 g/dL (ref 30.0–36.0)
MCV: 63.1 fL — ABNORMAL LOW (ref 78.0–100.0)
MONOS PCT: 7 %
Monocytes Absolute: 1.1 10*3/uL — ABNORMAL HIGH (ref 0.1–1.0)
NEUTROS PCT: 81 %
Neutro Abs: 13.2 10*3/uL — ABNORMAL HIGH (ref 1.7–7.7)
Platelets: 409 10*3/uL — ABNORMAL HIGH (ref 150–400)
RBC: 4.17 MIL/uL (ref 3.87–5.11)
RDW: 16.7 % — AB (ref 11.5–15.5)
WBC: 16.3 10*3/uL — AB (ref 4.0–10.5)

## 2016-08-31 LAB — COMPREHENSIVE METABOLIC PANEL
ALBUMIN: 2.8 g/dL — AB (ref 3.5–5.0)
ALT: 17 U/L (ref 14–54)
AST: 24 U/L (ref 15–41)
Alkaline Phosphatase: 114 U/L (ref 38–126)
Anion gap: 8 (ref 5–15)
BILIRUBIN TOTAL: 1.1 mg/dL (ref 0.3–1.2)
BUN: 8 mg/dL (ref 6–20)
CHLORIDE: 101 mmol/L (ref 101–111)
CO2: 23 mmol/L (ref 22–32)
Calcium: 8.2 mg/dL — ABNORMAL LOW (ref 8.9–10.3)
Creatinine, Ser: 0.73 mg/dL (ref 0.44–1.00)
GFR calc Af Amer: >60 mL/min (ref >60)
GFR calc non Af Amer: >60 mL/min (ref >60)
GLUCOSE: 112 mg/dL — AB (ref 65–99)
POTASSIUM: 3.6 mmol/L (ref 3.5–5.1)
Sodium: 132 mmol/L — ABNORMAL LOW (ref 135–145)
TOTAL PROTEIN: 6.5 g/dL (ref 6.5–8.1)

## 2016-08-31 LAB — I-STAT CG4 LACTIC ACID, ED: Lactic Acid, Venous: 1.55 mmol/L (ref 0.5–1.9)

## 2016-08-31 LAB — LACTIC ACID, PLASMA
LACTIC ACID, VENOUS: 1.2 mmol/L (ref 0.5–1.9)
Lactic Acid, Venous: 1.1 mmol/L (ref 0.5–1.9)

## 2016-08-31 LAB — TYPE AND SCREEN
ABO/RH(D): O POS
ANTIBODY SCREEN: NEGATIVE

## 2016-08-31 LAB — ABO/RH: ABO/RH(D): O POS

## 2016-08-31 MED ORDER — HYDRALAZINE HCL 50 MG PO TABS
100.0000 mg | ORAL_TABLET | Freq: Three times a day (TID) | ORAL | Status: DC
  Administered 2016-08-31 – 2016-09-10 (×27): 100 mg via ORAL
  Filled 2016-08-31 (×27): qty 2

## 2016-08-31 MED ORDER — SODIUM CHLORIDE 0.9 % IV SOLN
INTRAVENOUS | Status: DC
  Administered 2016-08-31 – 2016-09-09 (×10): via INTRAVENOUS
  Administered 2016-09-09: 1000 mL via INTRAVENOUS
  Administered 2016-09-10: 100 mL/h via INTRAVENOUS

## 2016-08-31 MED ORDER — ACETAMINOPHEN 650 MG RE SUPP: 650.0000 mg | Freq: Four times a day (QID) | RECTAL | Status: DC | PRN

## 2016-08-31 MED ORDER — PAROXETINE HCL 20 MG PO TABS
20.0000 mg | ORAL_TABLET | Freq: Every day | ORAL | Status: DC
  Administered 2016-08-31 – 2016-09-10 (×10): 20 mg via ORAL
  Filled 2016-08-31 (×10): qty 1

## 2016-08-31 MED ORDER — ONDANSETRON HCL 4 MG/2ML IJ SOLN: 4.0000 mg | Freq: Four times a day (QID) | INTRAMUSCULAR | Status: DC | PRN

## 2016-08-31 MED ORDER — CARIPRAZINE HCL 1.5 MG PO CAPS
4.5000 mg | ORAL_CAPSULE | Freq: Every day | ORAL | Status: DC
  Administered 2016-09-01 – 2016-09-09 (×9): 4.5 mg via ORAL
  Filled 2016-08-31 (×10): qty 3

## 2016-08-31 MED ORDER — SODIUM CHLORIDE 0.9 % IV BOLUS (SEPSIS)
1000.0000 mL | Freq: Once | INTRAVENOUS | Status: AC
  Administered 2016-08-31: 1000 mL via INTRAVENOUS

## 2016-08-31 MED ORDER — BISACODYL 10 MG RE SUPP: 10.0000 mg | Freq: Every day | RECTAL | Status: DC | PRN

## 2016-08-31 MED ORDER — LISINOPRIL 10 MG PO TABS: 10.0000 mg | ORAL_TABLET | Freq: Every day | ORAL | Status: DC

## 2016-08-31 MED ORDER — SODIUM CHLORIDE 0.9 % IV BOLUS (SEPSIS)
500.0000 mL | Freq: Once | INTRAVENOUS | Status: AC
  Administered 2016-08-31: 500 mL via INTRAVENOUS

## 2016-08-31 MED ORDER — HYDROMORPHONE HCL 1 MG/ML IJ SOLN
1.0000 mg | Freq: Once | INTRAMUSCULAR | Status: AC
  Administered 2016-08-31: 1 mg via INTRAVENOUS
  Filled 2016-08-31: qty 1

## 2016-08-31 MED ORDER — PIPERACILLIN-TAZOBACTAM 3.375 G IVPB 30 MIN
3.3750 g | Freq: Once | INTRAVENOUS | Status: AC
  Administered 2016-08-31: 3.375 g via INTRAVENOUS
  Filled 2016-08-31: qty 50

## 2016-08-31 MED ORDER — HYDRALAZINE HCL 50 MG PO TABS: 100.0000 mg | ORAL_TABLET | Freq: Three times a day (TID) | ORAL | Status: DC

## 2016-08-31 MED ORDER — CLONIDINE HCL 0.2 MG PO TABS
0.3000 mg | ORAL_TABLET | Freq: Three times a day (TID) | ORAL | Status: DC
  Administered 2016-08-31 – 2016-09-10 (×26): 0.3 mg via ORAL
  Filled 2016-08-31 (×27): qty 1

## 2016-08-31 MED ORDER — HYDROMORPHONE HCL 1 MG/ML IJ SOLN
1.0000 mg | INTRAMUSCULAR | Status: DC | PRN
  Administered 2016-08-31 – 2016-09-02 (×12): 1 mg via INTRAVENOUS
  Filled 2016-08-31 (×12): qty 1

## 2016-08-31 MED ORDER — LISINOPRIL 10 MG PO TABS
10.0000 mg | ORAL_TABLET | Freq: Every day | ORAL | Status: DC
  Administered 2016-08-31 – 2016-09-03 (×4): 10 mg via ORAL
  Filled 2016-08-31 (×4): qty 1

## 2016-08-31 MED ORDER — PIPERACILLIN-TAZOBACTAM 3.375 G IVPB
3.3750 g | Freq: Three times a day (TID) | INTRAVENOUS | Status: DC
  Administered 2016-08-31 – 2016-09-03 (×7): 3.375 g via INTRAVENOUS
  Filled 2016-08-31 (×9): qty 50

## 2016-08-31 MED ORDER — LORAZEPAM 1 MG PO TABS
1.0000 mg | ORAL_TABLET | Freq: Once | ORAL | Status: AC | PRN
  Administered 2016-09-09: 1 mg via ORAL
  Filled 2016-08-31: qty 1

## 2016-08-31 MED ORDER — CARVEDILOL 12.5 MG PO TABS: 25.0000 mg | ORAL_TABLET | Freq: Two times a day (BID) | ORAL | Status: DC

## 2016-08-31 MED ORDER — VANCOMYCIN HCL 10 G IV SOLR
2500.0000 mg | Freq: Once | INTRAVENOUS | Status: AC
  Administered 2016-08-31: 2500 mg via INTRAVENOUS
  Filled 2016-08-31: qty 2500

## 2016-08-31 MED ORDER — TRAMADOL HCL 50 MG PO TABS
50.0000 mg | ORAL_TABLET | Freq: Four times a day (QID) | ORAL | Status: DC | PRN
  Administered 2016-08-31 – 2016-09-01 (×3): 50 mg via ORAL
  Filled 2016-08-31 (×3): qty 1

## 2016-08-31 MED ORDER — PNEUMOCOCCAL VAC POLYVALENT 25 MCG/0.5ML IJ INJ
0.5000 mL | INJECTION | INTRAMUSCULAR | Status: AC
  Administered 2016-09-01: 0.5 mL via INTRAMUSCULAR
  Filled 2016-08-31: qty 0.5

## 2016-08-31 MED ORDER — VANCOMYCIN HCL 10 G IV SOLR
1250.0000 mg | Freq: Two times a day (BID) | INTRAVENOUS | Status: DC
  Administered 2016-09-01: 1250 mg via INTRAVENOUS
  Filled 2016-08-31 (×2): qty 1250

## 2016-08-31 MED ORDER — HYDROCHLOROTHIAZIDE 25 MG PO TABS
50.0000 mg | ORAL_TABLET | Freq: Every day | ORAL | Status: DC
  Administered 2016-09-01 – 2016-09-03 (×3): 50 mg via ORAL
  Filled 2016-08-31 (×3): qty 2

## 2016-08-31 MED ORDER — CLONIDINE HCL 0.2 MG PO TABS: 0.3000 mg | ORAL_TABLET | Freq: Three times a day (TID) | ORAL | Status: DC

## 2016-08-31 MED ORDER — ONDANSETRON HCL 4 MG PO TABS: 4.0000 mg | ORAL_TABLET | Freq: Four times a day (QID) | ORAL | Status: DC | PRN

## 2016-08-31 MED ORDER — PAROXETINE HCL 20 MG PO TABS
20.0000 mg | ORAL_TABLET | Freq: Every day | ORAL | Status: DC
Start: 2016-09-01 — End: 2016-08-31

## 2016-08-31 MED ORDER — SENNOSIDES-DOCUSATE SODIUM 8.6-50 MG PO TABS
1.0000 | ORAL_TABLET | Freq: Every evening | ORAL | Status: DC | PRN
  Filled 2016-08-31: qty 1

## 2016-08-31 MED ORDER — ACETAMINOPHEN 325 MG PO TABS
650.0000 mg | ORAL_TABLET | Freq: Four times a day (QID) | ORAL | Status: DC | PRN
  Administered 2016-08-31 – 2016-09-01 (×3): 650 mg via ORAL
  Filled 2016-08-31 (×3): qty 2

## 2016-08-31 MED ORDER — HYDROMORPHONE HCL 1 MG/ML IJ SOLN: 0.5000 mg | INTRAMUSCULAR | Status: DC | PRN
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
## 2016-08-31 NOTE — ED Triage Notes (Signed)
EMS given Fentanyl
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
## 2016-08-31 NOTE — Progress Notes (Signed)
[name] is a 46 y.o. female patient admitted from ED awake, alert - oriented  X 4 - no acute distress noted.  VSS - Blood pressure (!) 160/66, pulse 94, temperature 99.4 F (37.4 C), temperature source Oral, resp. rate 20, height 5\' 7"  (1.702 m), weight (!) 145.3 kg (320 lb 5.3 oz), last menstrual period 08/06/2013, SpO2 92 %.    IV in place, occlusive dsg intact without redness.  Orientation to room, and floor completed with information packet given to patient/family.  Patient declined safety video at this time.  Admission INP armband ID verified with patient/family, and in place.   SR up x 2, fall assessment complete, with patient and family able to verbalize understanding of risk associated with falls, and verbalized understanding to call nsg before up out of bed.  Call light within reach, patient able to voice, and demonstrate understanding.  Skin, clean-dry- intact without evidence of bruising, or skin tears.   No evidence of skin break down noted on exam.     Will cont to eval and treat per MD orders.  Luci Bank, RN 08/31/2016 5:43 PM
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
## 2016-08-31 NOTE — Progress Notes (Signed)
Pt refused to let RN and 2nd RN Great look at back and bottom  pt said she was in too much pain and refused to let us turn her RN gave pt tramadol and assess her legs arms and stomach. Will try again to look at pts back and bottom
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
## 2016-09-01 ENCOUNTER — Inpatient Hospital Stay (HOSPITAL_COMMUNITY): Payer: Medicare Other

## 2016-09-01 DIAGNOSIS — T148XXA Other injury of unspecified body region, initial encounter: Secondary | ICD-10-CM

## 2016-09-01 DIAGNOSIS — F329 Major depressive disorder, single episode, unspecified: Secondary | ICD-10-CM

## 2016-09-01 DIAGNOSIS — L089 Local infection of the skin and subcutaneous tissue, unspecified: Secondary | ICD-10-CM

## 2016-09-01 LAB — RAPID URINE DRUG SCREEN, HOSP PERFORMED
Amphetamines: NOT DETECTED
Barbiturates: NOT DETECTED
Benzodiazepines: NOT DETECTED
Cocaine: POSITIVE — AB
OPIATES: POSITIVE — AB
Tetrahydrocannabinol: POSITIVE — AB

## 2016-09-01 LAB — CBC
HEMATOCRIT: 25.4 % — AB (ref 36.0–46.0)
Hemoglobin: 7.8 g/dL — ABNORMAL LOW (ref 12.0–15.0)
MCH: 19.2 pg — AB (ref 26.0–34.0)
MCHC: 30.7 g/dL (ref 30.0–36.0)
MCV: 62.4 fL — ABNORMAL LOW (ref 78.0–100.0)
Platelets: 395 10*3/uL (ref 150–400)
RBC: 4.07 MIL/uL (ref 3.87–5.11)
RDW: 16 % — AB (ref 11.5–15.5)
WBC: 15.4 10*3/uL — ABNORMAL HIGH (ref 4.0–10.5)

## 2016-09-01 LAB — COMPREHENSIVE METABOLIC PANEL
ALBUMIN: 2.4 g/dL — AB (ref 3.5–5.0)
ALK PHOS: 152 U/L — AB (ref 38–126)
ALT: 25 U/L (ref 14–54)
AST: 28 U/L (ref 15–41)
Anion gap: 6 (ref 5–15)
BILIRUBIN TOTAL: 1.5 mg/dL — AB (ref 0.3–1.2)
BUN: 6 mg/dL (ref 6–20)
CALCIUM: 7.7 mg/dL — AB (ref 8.9–10.3)
CO2: 24 mmol/L (ref 22–32)
Chloride: 103 mmol/L (ref 101–111)
Creatinine, Ser: 0.83 mg/dL (ref 0.44–1.00)
GFR calc Af Amer: >60 mL/min (ref >60)
GFR calc non Af Amer: >60 mL/min (ref >60)
GLUCOSE: 138 mg/dL — AB (ref 65–99)
Potassium: 3.6 mmol/L (ref 3.5–5.1)
SODIUM: 133 mmol/L — AB (ref 135–145)
TOTAL PROTEIN: 6.1 g/dL — AB (ref 6.5–8.1)

## 2016-09-01 LAB — PROTIME-INR
INR: 1.08
Prothrombin Time: 14.1 seconds (ref 11.4–15.2)

## 2016-09-01 MED ORDER — ADULT MULTIVITAMIN LIQUID CH: 15.0000 mL | Freq: Every day | ORAL | Status: DC

## 2016-09-01 MED ORDER — ADULT MULTIVITAMIN W/MINERALS CH
1.0000 | ORAL_TABLET | Freq: Every day | ORAL | Status: DC
  Administered 2016-09-01 – 2016-09-10 (×9): 1 via ORAL
  Filled 2016-09-01 (×9): qty 1

## 2016-09-01 MED ORDER — VANCOMYCIN HCL 10 G IV SOLR
1250.0000 mg | Freq: Three times a day (TID) | INTRAVENOUS | Status: DC
  Administered 2016-09-01 – 2016-09-02 (×3): 1250 mg via INTRAVENOUS
  Filled 2016-09-01 (×5): qty 1250

## 2016-09-01 MED ORDER — IOPAMIDOL (ISOVUE-300) INJECTION 61%
INTRAVENOUS | Status: AC
  Administered 2016-09-01: 100 mL
  Filled 2016-09-01: qty 100

## 2016-09-01 MED ORDER — PRO-STAT SUGAR FREE PO LIQD
30.0000 mL | Freq: Two times a day (BID) | ORAL | Status: DC
  Administered 2016-09-01 – 2016-09-10 (×17): 30 mL via ORAL
  Filled 2016-09-01 (×18): qty 30
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
## 2016-09-02 ENCOUNTER — Encounter (HOSPITAL_COMMUNITY)
Admission: EM | Disposition: A | Discharge status: Home / Self Care | Payer: Self-pay | Source: Home / Self Care | Attending: Family Medicine

## 2016-09-02 ENCOUNTER — Inpatient Hospital Stay (HOSPITAL_COMMUNITY): Payer: Medicare Other | Admitting: Certified Registered Nurse Anesthetist

## 2016-09-02 ENCOUNTER — Encounter (HOSPITAL_COMMUNITY): Payer: Self-pay | Admitting: Surgery

## 2016-09-02 DIAGNOSIS — S8991XA Unspecified injury of right lower leg, initial encounter: Secondary | ICD-10-CM

## 2016-09-02 DIAGNOSIS — S8781XS Crushing injury of right lower leg, sequela: Secondary | ICD-10-CM

## 2016-09-02 HISTORY — PX: I & D EXTREMITY: SHX5045

## 2016-09-02 LAB — CBC
HCT: 22.8 % — ABNORMAL LOW (ref 36.0–46.0)
Hemoglobin: 7.2 g/dL — ABNORMAL LOW (ref 12.0–15.0)
MCH: 19.3 pg — AB (ref 26.0–34.0)
MCHC: 31.6 g/dL (ref 30.0–36.0)
MCV: 61.1 fL — ABNORMAL LOW (ref 78.0–100.0)
PLATELETS: 466 10*3/uL — AB (ref 150–400)
RBC: 3.73 MIL/uL — AB (ref 3.87–5.11)
RDW: 16.1 % — ABNORMAL HIGH (ref 11.5–15.5)
WBC: 13.8 10*3/uL — ABNORMAL HIGH (ref 4.0–10.5)

## 2016-09-02 LAB — VANCOMYCIN, TROUGH: Vancomycin Tr: 25 ug/mL (ref 15–20)

## 2016-09-02 LAB — MRSA PCR SCREENING: MRSA by PCR: NEGATIVE

## 2016-09-02 SURGERY — IRRIGATION AND DEBRIDEMENT EXTREMITY
Anesthesia: General | Laterality: Right

## 2016-09-02 MED ORDER — MIDAZOLAM HCL 5 MG/5ML IJ SOLN
INTRAMUSCULAR | Status: DC | PRN
  Administered 2016-09-02 (×2): 1 mg via INTRAVENOUS

## 2016-09-02 MED ORDER — LABETALOL HCL 5 MG/ML IV SOLN
10.0000 mg | Freq: Once | INTRAVENOUS | Status: AC
  Administered 2016-09-02: 10 mg via INTRAVENOUS

## 2016-09-02 MED ORDER — MAGNESIUM CITRATE PO SOLN: 1.0000 | Freq: Once | ORAL | Status: DC | PRN

## 2016-09-02 MED ORDER — FENTANYL CITRATE (PF) 100 MCG/2ML IJ SOLN
25.0000 ug | INTRAMUSCULAR | Status: DC | PRN
  Administered 2016-09-02 (×3): 50 ug via INTRAVENOUS

## 2016-09-02 MED ORDER — METHOCARBAMOL 500 MG PO TABS: 500.0000 mg | ORAL_TABLET | Freq: Four times a day (QID) | ORAL | Status: DC | PRN

## 2016-09-02 MED ORDER — OXYCODONE-ACETAMINOPHEN 5-325 MG PO TABS
1.0000 | ORAL_TABLET | ORAL | Status: DC | PRN
  Administered 2016-09-02: 2 via ORAL
  Filled 2016-09-02: qty 2

## 2016-09-02 MED ORDER — CHLORHEXIDINE GLUCONATE 4 % EX LIQD
60.0000 mL | Freq: Once | CUTANEOUS | Status: AC
  Administered 2016-09-02: 4 via TOPICAL
  Filled 2016-09-02: qty 60

## 2016-09-02 MED ORDER — PROPOFOL 10 MG/ML IV BOLUS
INTRAVENOUS | Status: DC | PRN
  Administered 2016-09-02: 200 mg via INTRAVENOUS

## 2016-09-02 MED ORDER — METOCLOPRAMIDE HCL 10 MG PO TABS: 5.0000 mg | ORAL_TABLET | Freq: Three times a day (TID) | ORAL | Status: DC | PRN

## 2016-09-02 MED ORDER — HYDROMORPHONE HCL 1 MG/ML IJ SOLN
INTRAMUSCULAR | Status: AC
  Filled 2016-09-02: qty 1

## 2016-09-02 MED ORDER — METOCLOPRAMIDE HCL 5 MG/ML IJ SOLN: 5.0000 mg | Freq: Three times a day (TID) | INTRAMUSCULAR | Status: DC | PRN

## 2016-09-02 MED ORDER — ONDANSETRON HCL 4 MG/2ML IJ SOLN
INTRAMUSCULAR | Status: DC | PRN
  Administered 2016-09-02: 4 mg via INTRAVENOUS

## 2016-09-02 MED ORDER — ONDANSETRON HCL 4 MG PO TABS: 4.0000 mg | ORAL_TABLET | Freq: Four times a day (QID) | ORAL | Status: DC | PRN

## 2016-09-02 MED ORDER — ACETAMINOPHEN 325 MG PO TABS
650.0000 mg | ORAL_TABLET | Freq: Four times a day (QID) | ORAL | Status: DC | PRN
  Administered 2016-09-07 – 2016-09-10 (×3): 650 mg via ORAL
  Filled 2016-09-02 (×3): qty 2

## 2016-09-02 MED ORDER — FENTANYL CITRATE (PF) 100 MCG/2ML IJ SOLN
INTRAMUSCULAR | Status: AC
  Filled 2016-09-02: qty 2

## 2016-09-02 MED ORDER — LACTATED RINGERS IV SOLN: INTRAVENOUS | Status: DC

## 2016-09-02 MED ORDER — METHOCARBAMOL 1000 MG/10ML IJ SOLN
500.0000 mg | Freq: Four times a day (QID) | INTRAVENOUS | Status: DC | PRN
  Filled 2016-09-02: qty 5

## 2016-09-02 MED ORDER — HYDROMORPHONE HCL 1 MG/ML IJ SOLN
1.0000 mg | INTRAMUSCULAR | Status: DC | PRN
  Administered 2016-09-02 – 2016-09-03 (×5): 1 mg via INTRAVENOUS
  Filled 2016-09-02 (×5): qty 1

## 2016-09-02 MED ORDER — 0.9 % SODIUM CHLORIDE (POUR BTL) OPTIME
TOPICAL | Status: DC | PRN
  Administered 2016-09-02: 1000 mL

## 2016-09-02 MED ORDER — METOCLOPRAMIDE HCL 5 MG/ML IJ SOLN: 10.0000 mg | Freq: Once | INTRAMUSCULAR | Status: DC | PRN

## 2016-09-02 MED ORDER — DEXTROSE 5 % IV SOLN
3.0000 g | INTRAVENOUS | Status: AC
  Administered 2016-09-02: 3 g via INTRAVENOUS
  Filled 2016-09-02: qty 3000

## 2016-09-02 MED ORDER — LIDOCAINE HCL (CARDIAC) 20 MG/ML IV SOLN
INTRAVENOUS | Status: DC | PRN
  Administered 2016-09-02: 100 mg via INTRAVENOUS

## 2016-09-02 MED ORDER — OXYCODONE HCL 5 MG PO TABS
5.0000 mg | ORAL_TABLET | ORAL | Status: DC | PRN
  Administered 2016-09-02: 10 mg via ORAL

## 2016-09-02 MED ORDER — ACETAMINOPHEN 650 MG RE SUPP: 650.0000 mg | Freq: Four times a day (QID) | RECTAL | Status: DC | PRN

## 2016-09-02 MED ORDER — FENTANYL CITRATE (PF) 250 MCG/5ML IJ SOLN
INTRAMUSCULAR | Status: AC
  Filled 2016-09-02: qty 5

## 2016-09-02 MED ORDER — FENTANYL CITRATE (PF) 100 MCG/2ML IJ SOLN
INTRAMUSCULAR | Status: DC | PRN
  Administered 2016-09-02: 100 ug via INTRAVENOUS
  Administered 2016-09-02: 25 ug via INTRAVENOUS
  Administered 2016-09-02: 50 ug via INTRAVENOUS
  Administered 2016-09-02: 25 ug via INTRAVENOUS

## 2016-09-02 MED ORDER — SODIUM CHLORIDE 0.9 % IV SOLN
INTRAVENOUS | Status: DC
  Administered 2016-09-02: 20:00:00 via INTRAVENOUS

## 2016-09-02 MED ORDER — ONDANSETRON HCL 4 MG/2ML IJ SOLN: 4.0000 mg | Freq: Four times a day (QID) | INTRAMUSCULAR | Status: DC | PRN

## 2016-09-02 MED ORDER — HYDROMORPHONE HCL 1 MG/ML IJ SOLN
0.5000 mg | INTRAMUSCULAR | Status: DC | PRN
  Administered 2016-09-02 (×2): 0.5 mg via INTRAVENOUS

## 2016-09-02 MED ORDER — MEPERIDINE HCL 25 MG/ML IJ SOLN: 6.2500 mg | INTRAMUSCULAR | Status: DC | PRN

## 2016-09-02 MED ORDER — MIDAZOLAM HCL 2 MG/2ML IJ SOLN
INTRAMUSCULAR | Status: AC
  Filled 2016-09-02: qty 2

## 2016-09-02 MED ORDER — OXYCODONE HCL 5 MG PO TABS
ORAL_TABLET | ORAL | Status: AC
  Filled 2016-09-02: qty 2

## 2016-09-02 MED ORDER — VANCOMYCIN HCL IN DEXTROSE 750-5 MG/150ML-% IV SOLN
750.0000 mg | Freq: Three times a day (TID) | INTRAVENOUS | Status: DC
  Administered 2016-09-02 – 2016-09-04 (×5): 750 mg via INTRAVENOUS
  Filled 2016-09-02 (×6): qty 150

## 2016-09-02 MED ORDER — POLYETHYLENE GLYCOL 3350 17 G PO PACK: 17.0000 g | PACK | Freq: Every day | ORAL | Status: DC | PRN

## 2016-09-02 MED ORDER — LACTATED RINGERS IV SOLN
INTRAVENOUS | Status: DC
Start: 2016-09-02 — End: 2016-09-04
  Administered 2016-09-02 (×2): via INTRAVENOUS

## 2016-09-02 MED ORDER — BISACODYL 10 MG RE SUPP: 10.0000 mg | Freq: Every day | RECTAL | Status: DC | PRN

## 2016-09-02 MED ORDER — DOCUSATE SODIUM 100 MG PO CAPS
100.0000 mg | ORAL_CAPSULE | Freq: Two times a day (BID) | ORAL | Status: DC
  Administered 2016-09-02 – 2016-09-10 (×14): 100 mg via ORAL
  Filled 2016-09-02 (×15): qty 1

## 2016-09-02 MED ORDER — POVIDONE-IODINE 10 % EX SWAB: 2.0000 "application " | Freq: Once | CUTANEOUS | Status: DC

## 2016-09-02 MED ORDER — LABETALOL HCL 5 MG/ML IV SOLN
INTRAVENOUS | Status: AC
  Filled 2016-09-02: qty 4

## 2016-09-02 SURGICAL SUPPLY — 31 items
BLADE SURG 21 STRL SS (BLADE) ×3 IMPLANT
BNDG COHESIVE 6X5 TAN STRL LF (GAUZE/BANDAGES/DRESSINGS) ×2 IMPLANT
BNDG GAUZE ELAST 4 BULKY (GAUZE/BANDAGES/DRESSINGS) ×4 IMPLANT
COVER SURGICAL LIGHT HANDLE (MISCELLANEOUS) ×6 IMPLANT
DRAPE U-SHAPE 47X51 STRL (DRAPES) ×3 IMPLANT
DRSG ADAPTIC 3X8 NADH LF (GAUZE/BANDAGES/DRESSINGS) ×3 IMPLANT
DURAPREP 26ML APPLICATOR (WOUND CARE) ×3 IMPLANT
ELECT REM PT RETURN 9FT ADLT (ELECTROSURGICAL)
ELECTRODE REM PT RTRN 9FT ADLT (ELECTROSURGICAL) IMPLANT
GAUZE SPONGE 4X4 12PLY STRL (GAUZE/BANDAGES/DRESSINGS) ×3 IMPLANT
GLOVE BIOGEL PI IND STRL 9 (GLOVE) ×1 IMPLANT
GLOVE BIOGEL PI INDICATOR 9 (GLOVE) ×2
GLOVE SURG ORTHO 9.0 STRL STRW (GLOVE) ×3 IMPLANT
GOWN STRL REUS W/ TWL XL LVL3 (GOWN DISPOSABLE) ×2 IMPLANT
GOWN STRL REUS W/TWL XL LVL3 (GOWN DISPOSABLE) ×6
HANDPIECE INTERPULSE COAX TIP (DISPOSABLE)
KIT BASIN OR (CUSTOM PROCEDURE TRAY) ×3 IMPLANT
KIT ROOM TURNOVER OR (KITS) ×3 IMPLANT
MANIFOLD NEPTUNE II (INSTRUMENTS) ×3 IMPLANT
NS IRRIG 1000ML POUR BTL (IV SOLUTION) ×3 IMPLANT
PACK ORTHO EXTREMITY (CUSTOM PROCEDURE TRAY) ×3 IMPLANT
PAD ABD 8X10 STRL (GAUZE/BANDAGES/DRESSINGS) ×2 IMPLANT
PAD ARMBOARD 7.5X6 YLW CONV (MISCELLANEOUS) ×6 IMPLANT
SET HNDPC FAN SPRY TIP SCT (DISPOSABLE) IMPLANT
STOCKINETTE IMPERVIOUS 9X36 MD (GAUZE/BANDAGES/DRESSINGS) IMPLANT
SWAB COLLECTION DEVICE MRSA (MISCELLANEOUS) ×3 IMPLANT
SWAB CULTURE ESWAB REG 1ML (MISCELLANEOUS) IMPLANT
TOWEL OR 17X26 10 PK STRL BLUE (TOWEL DISPOSABLE) ×3 IMPLANT
TUBE CONNECTING 12'X1/4 (SUCTIONS) ×1
TUBE CONNECTING 12X1/4 (SUCTIONS) ×2 IMPLANT
YANKAUER SUCT BULB TIP NO VENT (SUCTIONS) ×3 IMPLANT
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
## 2016-09-02 NOTE — Anesthesia Preprocedure Evaluation (Signed)
Anesthesia Evaluation  Patient identified by MRN, date of birth, ID band Patient awake    Reviewed: Allergy & Precautions, NPO status , Patient's Chart, lab work & pertinent test results  Airway Mallampati: II  TM Distance: >3 FB Neck ROM: Full    Dental no notable dental hx.    Pulmonary former smoker,    Pulmonary exam normal breath sounds clear to auscultation       Cardiovascular hypertension, +CHF  Normal cardiovascular exam Rhythm:Regular Rate:Normal     Neuro/Psych Anxiety Depression Bipolar Disorder CVA    GI/Hepatic GERD  Medicated,(+)     substance abuse  cocaine use and marijuana use,   Endo/Other  negative endocrine ROSMorbid obesity  Renal/GU negative Renal ROS  negative genitourinary   Musculoskeletal negative musculoskeletal ROS (+)   Abdominal   Peds negative pediatric ROS (+)  Hematology  (+) Sickle cell trait ,   Anesthesia Other Findings   Reproductive/Obstetrics negative OB ROS                             Anesthesia Physical Anesthesia Plan  ASA: II  Anesthesia Plan: General   Post-op Pain Management:    Induction: Intravenous  PONV Risk Score and Plan: 3 and Ondansetron, Dexamethasone, Propofol and Midazolam  Airway Management Planned: LMA  Additional Equipment:   Intra-op Plan:   Post-operative Plan: Extubation in OR  Informed Consent: I have reviewed the patients History and Physical, chart, labs and discussed the procedure including the risks, benefits and alternatives for the proposed anesthesia with the patient or authorized representative who has indicated his/her understanding and acceptance.   Dental advisory given  Plan Discussed with: CRNA  Anesthesia Plan Comments:         Anesthesia Quick Evaluation

## 2016-09-02 NOTE — Transfer of Care (Signed)
Immediate Anesthesia Transfer of Care Note  Patient: Kelly Novak  Procedure(s) Performed: Procedure(s): IRRIGATION AND DEBRIDEMENT EXTREMITY (Right)  Patient Location: PACU  Anesthesia Type:General  Level of Consciousness: awake, alert  and oriented  Airway & Oxygen Therapy: Patient Spontanous Breathing and Patient connected to nasal cannula oxygen  Post-op Assessment: Report given to RN and Post -op Vital signs reviewed and stable  Post vital signs: Reviewed and stable  Last Vitals:  Vitals:   09/02/16 0536 09/02/16 1413  BP: 125/62 130/63  Pulse: 98 (!) 108  Resp: 18 16  Temp: 37.2 C 37.4 C    Last Pain:  Vitals:   09/02/16 1413  TempSrc: Oral  PainSc:       Patients Stated Pain Goal: 0 (40/10/27 2536)  Complications: No apparent anesthesia complications

## 2016-09-02 NOTE — Op Note (Signed)
08/31/2016 - 09/02/2016  4:33 PM  PATIENT:  Kelly Novak    PRE-OPERATIVE DIAGNOSIS:  Hematoma  POST-OPERATIVE DIAGNOSIS:  Necrotic muscle in all 4 compartments right leg with necrotic soft tissue envelope right leg right thigh and necrotic muscle medial right thigh  PROCEDURE:  IRRIGATION AND DEBRIDEMENT EXTREMITY  Excision muscle soft tissue and skin right leg and right thigh. 4 compartment fasciotomies right leg.  SURGEON:  Newt Minion, MD  PHYSICIAN ASSISTANT:None ANESTHESIA:   General  PREOPERATIVE INDICATIONS:  [name] is a  46 y.o. female with a diagnosis of Hematoma who failed conservative measures and elected for surgical management.    The risks benefits and alternatives were discussed with the patient preoperatively including but not limited to the risks of infection, bleeding, nerve injury, cardiopulmonary complications, the need for revision surgery, among others, and the patient was willing to proceed.  OPERATIVE IMPLANTS: None  OPERATIVE FINDINGS: Necrotic muscle right leg and right thigh without compartment syndrome.  OPERATIVE PROCEDURE: Patient was brought to the operating room and underwent a general anesthetic. After adequate levels anesthesia were obtained patient's right lower extremity was prepped using DuraPrep draped into a sterile field a timeout was called. A longitudinal incision was made medially on the thigh a large hematoma was evacuated all of the adipose tissue medially was necrotic. This was then carried down to the medial thigh muscles which were not contractile with electrocautery. The hematoma was evacuated. Patient then underwent medial and lateral incisions on the right leg. This was carried down and 4 compartment fasciotomies were performed. There was hematoma in the subcutaneous tissue and within the muscles but there was no tightness to the compartments no signs of compartment syndrome there was a crush shearing injury to the muscles.  The  muscles did not have contractility with electrocautery they had poor color and would tear within the fingers there was no normal consistency to the muscle tissue. The medial soft tissue and skin envelope of the leg was completely necrotic and the medial thigh soft tissue and skin was necrotic as well.  The wounds were packed open with Kerlix and ABDs dressing and Coban and. Patient was extubated taken to the PACU in stable condition.  Patient will require an above-the-knee amputation we'll set this up for Friday. It is unclear if this will heal due to the massive crush and shear injury to all of the muscle and soft tissue and skin.
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
## 2016-09-02 NOTE — Progress Notes (Signed)
CM received consult: medication needs. CM will  speak and provide pt with [facility] information to assist with medication needs/issues. CM made referral to CSW : medicaid application. Whitman Hero RN,BSN,CM
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
## 2016-09-02 NOTE — Anesthesia Procedure Notes (Signed)
Procedure Name: LMA Insertion Date/Time: 09/02/2016 3:56 PM Performed by: Clearnce Sorrel Pre-anesthesia Checklist: Patient identified, Emergency Drugs available, Suction available, Patient being monitored and Timeout performed Patient Re-evaluated:Patient Re-evaluated prior to induction Oxygen Delivery Method: Circle system utilized Preoxygenation: Pre-oxygenation with 100% oxygen Induction Type: IV induction LMA: LMA inserted LMA Size: 4.0 Number of attempts: 1 Placement Confirmation: positive ETCO2 and breath sounds checked- equal and bilateral Tube secured with: Tape Dental Injury: Teeth and Oropharynx as per pre-operative assessment

## 2016-09-03 ENCOUNTER — Encounter (HOSPITAL_COMMUNITY): Payer: Self-pay | Admitting: Orthopedic Surgery

## 2016-09-03 ENCOUNTER — Other Ambulatory Visit (INDEPENDENT_AMBULATORY_CARE_PROVIDER_SITE_OTHER): Payer: Self-pay | Admitting: Family

## 2016-09-03 DIAGNOSIS — S8781XS Crushing injury of right lower leg, sequela: Secondary | ICD-10-CM

## 2016-09-03 DIAGNOSIS — F17200 Nicotine dependence, unspecified, uncomplicated: Secondary | ICD-10-CM

## 2016-09-03 LAB — CBC WITH DIFFERENTIAL/PLATELET
BASOS ABS: 0 10*3/uL (ref 0.0–0.1)
Basophils Relative: 0 %
Eosinophils Absolute: 0 10*3/uL (ref 0.0–0.7)
Eosinophils Relative: 0 %
HEMATOCRIT: 23.8 % — AB (ref 36.0–46.0)
HEMOGLOBIN: 7.5 g/dL — AB (ref 12.0–15.0)
LYMPHS PCT: 12 %
Lymphs Abs: 1.6 10*3/uL (ref 0.7–4.0)
MCH: 19.5 pg — ABNORMAL LOW (ref 26.0–34.0)
MCHC: 31.5 g/dL (ref 30.0–36.0)
MCV: 61.8 fL — ABNORMAL LOW (ref 78.0–100.0)
MONOS PCT: 7 %
Monocytes Absolute: 0.9 10*3/uL (ref 0.1–1.0)
NEUTROS ABS: 10.6 10*3/uL — AB (ref 1.7–7.7)
Neutrophils Relative %: 81 %
Platelets: 455 10*3/uL — ABNORMAL HIGH (ref 150–400)
RBC: 3.85 MIL/uL — ABNORMAL LOW (ref 3.87–5.11)
RDW: 16.2 % — ABNORMAL HIGH (ref 11.5–15.5)
WBC: 13.1 10*3/uL — ABNORMAL HIGH (ref 4.0–10.5)

## 2016-09-03 LAB — BASIC METABOLIC PANEL
ANION GAP: 11 (ref 5–15)
BUN: 16 mg/dL (ref 6–20)
CO2: 23 mmol/L (ref 22–32)
Calcium: 7.5 mg/dL — ABNORMAL LOW (ref 8.9–10.3)
Chloride: 99 mmol/L — ABNORMAL LOW (ref 101–111)
Creatinine, Ser: 1.32 mg/dL — ABNORMAL HIGH (ref 0.44–1.00)
GFR calc Af Amer: 56 mL/min — ABNORMAL LOW (ref >60)
GFR calc non Af Amer: 48 mL/min — ABNORMAL LOW (ref >60)
GLUCOSE: 128 mg/dL — AB (ref 65–99)
POTASSIUM: 3.8 mmol/L (ref 3.5–5.1)
Sodium: 133 mmol/L — ABNORMAL LOW (ref 135–145)

## 2016-09-03 LAB — GLUCOSE, CAPILLARY: GLUCOSE-CAPILLARY: 136 mg/dL — AB (ref 65–99)

## 2016-09-03 LAB — VANCOMYCIN, TROUGH: Vancomycin Tr: 19 ug/mL (ref 15–20)

## 2016-09-03 MED ORDER — HYDROMORPHONE HCL 1 MG/ML IJ SOLN
0.5000 mg | INTRAMUSCULAR | Status: DC | PRN
  Administered 2016-09-03 – 2016-09-04 (×5): 0.5 mg via INTRAVENOUS
  Filled 2016-09-03 (×5): qty 0.5

## 2016-09-03 MED ORDER — OXYCODONE HCL 5 MG PO TABS
5.0000 mg | ORAL_TABLET | ORAL | Status: DC | PRN
Start: 2016-09-03 — End: 2016-09-04
  Administered 2016-09-03 – 2016-09-04 (×4): 10 mg via ORAL
  Filled 2016-09-03 (×4): qty 2

## 2016-09-03 MED ORDER — DEXTROSE 5 % IV SOLN
1.0000 g | Freq: Three times a day (TID) | INTRAVENOUS | Status: DC
  Administered 2016-09-03 – 2016-09-04 (×3): 1 g via INTRAVENOUS
  Filled 2016-09-03 (×4): qty 1

## 2016-09-03 NOTE — NC FL2 (Signed)
[place] MEDICAID FL2 LEVEL OF CARE SCREENING TOOL     IDENTIFICATION  Patient Name: Kelly Novak Birthdate: 1971-02-03 Sex: female Admission Date (Current Location): 08/31/2016  [facility] and [place] Number:  Herbalist and Address:  [facility] [place] [place] [address] [place], [place] [address]      Provider Number: [id number]  Attending Physician Name and Address:  Mariel Aloe, MD  Relative Name and Phone Number:       Current Level of Care: Hospital Recommended Level of Care: [place] Prior Approval Number:    Date Approved/Denied:   PASRR Number:    Discharge Plan: SNF    Current Diagnoses: Patient Active Problem List   Diagnosis Date Noted  . Injury of right leg   . Crush injury lower leg, right, sequela   . Post-traumatic wound infection   . Cellulitis 08/31/2016  . Cellulitis of right leg 08/31/2016  . Bipolar disorder ([place]) 08/31/2016  . History of stroke 08/07/2016  . Polysubstance abuse   . Chronic systolic (congestive) heart failure ([place])   . Depression 11/03/2015  . GERD (gastroesophageal reflux disease) 11/03/2015  . Hypokalemia 11/03/2015  . Abdominal pain 11/03/2015  . Chronic back pain   . Abnormal uterine bleeding 08/10/2013  . Uterine fibroids 08/10/2013  . ASCUS pap with negative HRHPV 08/10/2013  . Hypertensive emergency 06/06/2013  . Chest pain 06/06/2013  . Hypertensive urgency 06/06/2013  . Pes planus (flat feet) 09/02/2012  . Anemia 09/02/2012  . Foot pain 09/02/2012  . Smoker 09/02/2012    Orientation RESPIRATION BLADDER Height & Weight     Self, Time, Situation, Place  Normal Continent Weight: (!) 143.7 kg (316 lb 12.8 oz) Height:  5\' 7"  (170.2 cm)  BEHAVIORAL SYMPTOMS/MOOD NEUROLOGICAL BOWEL NUTRITION STATUS      Continent Diet (Please see DC Summary)  AMBULATORY STATUS COMMUNICATION OF NEEDS Skin   Limited Assist Verbally  (right leg aka on 09/04/16)                        Personal Care Assistance Level of Assistance  Bathing, Feeding, Dressing Bathing Assistance: Limited assistance Feeding assistance: Independent Dressing Assistance: Limited assistance     Functional Limitations Info             SPECIAL CARE FACTORS FREQUENCY  PT (By licensed PT)     PT Frequency: 5x/week              Contractures      Additional Factors Info  Code Status, Allergies, Psychotropic Code Status Info: Full Allergies Info: Tape Psychotropic Info: Paxil         Current Medications (09/03/2016):  This is the current hospital active medication list Current Facility-Administered Medications  Medication Dose Route Frequency Provider Last Rate Last Dose  . 0.9 %  sodium chloride infusion   Intravenous Continuous Rondel Jumbo, PA-C 75 mL/hr at 09/01/16 1235    . 0.9 %  sodium chloride infusion   Intravenous Continuous Newt Minion, MD 10 mL/hr at 09/02/16 2011    . acetaminophen (TYLENOL) tablet 650 mg  650 mg Oral Q6H PRN Newt Minion, MD       Or  . acetaminophen (TYLENOL) suppository 650 mg  650 mg Rectal Q6H PRN Newt Minion, MD      . bisacodyl (DULCOLAX) suppository 10 mg  10 mg Rectal Daily PRN Newt Minion, MD      .  Cariprazine HCl CAPS 4.5 mg  4.5 mg Oral QHS Rondel Jumbo, PA-C   4.5 mg at 09/02/16 2229  . cloNIDine (CATAPRES) tablet 0.3 mg  0.3 mg Oral TID Rondel Jumbo, PA-C   0.3 mg at 09/03/16 7078  . docusate sodium (COLACE) capsule 100 mg  100 mg Oral BID Newt Minion, MD   100 mg at 09/03/16 0809  . feeding supplement (PRO-STAT SUGAR FREE 64) liquid 30 mL  30 mL Oral BID Mariel Aloe, MD   30 mL at 09/03/16 0808  . hydrALAZINE (APRESOLINE) tablet 100 mg  100 mg Oral Q8H Wertman, Coralee Pesa, PA-C   100 mg at 09/03/16 0553  . hydrochlorothiazide (HYDRODIURIL) tablet 50 mg  50 mg Oral Daily Rondel Jumbo, PA-C   50 mg at 09/03/16 0809  . HYDROmorphone (DILAUDID) injection 0.5 mg  0.5 mg Intravenous Q4H PRN Mariel Aloe, MD       . lactated ringers infusion   Intravenous Continuous Montez Hageman, MD 10 mL/hr at 09/02/16 1503    . lisinopril (PRINIVIL,ZESTRIL) tablet 10 mg  10 mg Oral Daily Rondel Jumbo, PA-C   10 mg at 09/03/16 0809  . LORazepam (ATIVAN) tablet 1 mg  1 mg Oral Once PRN Sharene Butters E, PA-C      . magnesium citrate solution 1 Bottle  1 Bottle Oral Once PRN Newt Minion, MD      . methocarbamol (ROBAXIN) tablet 500 mg  500 mg Oral Q6H PRN Newt Minion, MD       Or  . methocarbamol (ROBAXIN) 500 mg in dextrose 5 % 50 mL IVPB  500 mg Intravenous Q6H PRN Newt Minion, MD      . metoCLOPramide (REGLAN) tablet 5-10 mg  5-10 mg Oral Q8H PRN Newt Minion, MD       Or  . metoCLOPramide (REGLAN) injection 5-10 mg  5-10 mg Intravenous Q8H PRN Newt Minion, MD      . multivitamin with minerals tablet 1 tablet  1 tablet Oral Daily Mariel Aloe, MD   1 tablet at 09/03/16 0809  . ondansetron (ZOFRAN) tablet 4 mg  4 mg Oral Q6H PRN Newt Minion, MD       Or  . ondansetron [facility]) injection 4 mg  4 mg Intravenous Q6H PRN Newt Minion, MD      . oxyCODONE (Oxy IR/ROXICODONE) immediate release tablet 5-10 mg  5-10 mg Oral Q3H PRN Mariel Aloe, MD      . PARoxetine (PAXIL) tablet 20 mg  20 mg Oral Daily Rondel Jumbo, PA-C   20 mg at 09/03/16 6754  . polyethylene glycol (MIRALAX / GLYCOLAX) packet 17 g  17 g Oral Daily PRN Newt Minion, MD      . senna-docusate (Senokot-S) tablet 1 tablet  1 tablet Oral QHS PRN Rondel Jumbo, PA-C      . vancomycin (VANCOCIN) IVPB 750 mg/150 ml premix  750 mg Intravenous Q8H Baggett, Blaine Hamper, RPH   Stopped at 09/03/16 4920     Discharge Medications: Please see discharge summary for a list of discharge medications.  Relevant Imaging Results:  Relevant Lab Results:   Additional Information SSN: [place], [place]

## 2016-09-03 NOTE — Anesthesia Postprocedure Evaluation (Signed)
Anesthesia Post Note  Patient: Mehreen E Beery  Procedure(s) Performed: Procedure(s) (LRB): IRRIGATION AND DEBRIDEMENT EXTREMITY (Right)     Patient location during evaluation: PACU Anesthesia Type: General Level of consciousness: awake and alert Pain management: pain level controlled Vital Signs Assessment: post-procedure vital signs reviewed and stable Respiratory status: spontaneous breathing, nonlabored ventilation, respiratory function stable and patient connected to nasal cannula oxygen Cardiovascular status: blood pressure returned to baseline and stable Postop Assessment: no signs of nausea or vomiting Anesthetic complications: no    Last Vitals:  Vitals:   09/02/16 2159 09/03/16 0547  BP: (!) 153/79 (!) 153/80  Pulse: (!) 113 (!) 105  Resp: 18 18  Temp: 37 C 36.7 C    Last Pain:  Vitals:   09/03/16 0624  TempSrc:   PainSc: Asleep                 Montez Hageman

## 2016-09-03 NOTE — Progress Notes (Signed)
Patient ID: Kelly Novak, female   DOB: [birth year], 46 y.o.   MRN: [id number] Postoperative day 1 debridement right leg. All muscles in the leg were nonviable no signs of compartment syndrome. The soft tissue envelope was also nonviable. The thigh also showed a nonviable soft tissue envelope with necrotic soft tissue and muscle. We will plan for an above-the-knee amputation tomorrow Friday. I have discussed the findings with the patient and she is in agreement with proceeding with surgery.

## 2016-09-03 NOTE — Progress Notes (Signed)
Patient right leg has had bleeding throughout the night, moderate amount, chuck was changed this morning around 06:00 a.m.  Will continue to monitor.  Wilson Singer, RN
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
## 2016-09-03 NOTE — Progress Notes (Signed)
Pharmacy Antibiotic Note  Sahana Boyland Dunstan is a 46 y.o. female admitted on 08/31/2016 with cellulitis.  Pharmacy has been consulted for Vancomycin dosing.  Repeated Trough today due to increased Cr.  Have not achieved goal trough, but not steady state.  OK to redose, communicated to RN  Plan: Continue Vancomycin 750mg  IV q8 Watch Cr closely  Height: 5\' 7"  (170.2 cm) Weight: (!) 316 lb 12.8 oz (143.7 kg) IBW/kg (Calculated) : 61.6  Temp (24hrs), Avg:98.7 F (37.1 C), Min:98.1 F (36.7 C), Max:99.3 F (37.4 C)   Recent Labs Lab 08/31/16 1215 08/31/16 1233 08/31/16 1750 08/31/16 2050 09/01/16 0435 09/02/16 1135 09/02/16 1137 09/03/16 0340 09/03/16 1217  WBC 16.3*  --   --   --  15.4*  --  13.8* 13.1*  --   CREATININE 0.73  --   --   --  0.83  --   --  1.32*  --   LATICACIDVEN  --  1.55 1.2 1.1  --   --   --   --   --   VANCOTROUGH  --   --   --   --   --  25*  --   --  19    Estimated Creatinine Clearance: 81.1 mL/min (A) (by C-G formula based on SCr of 1.32 mg/dL (H)).    Allergies  Allergen Reactions  . Tape Itching and Rash    "took skin off"     Thank you for allowing pharmacy to be a part of this patient's care.   Lewie Chamber., PharmD Clinical Pharmacist [facility]

## 2016-09-03 NOTE — Progress Notes (Signed)
Pharmacy Antibiotic Note  Kelly Novak is a 46 y.o. female admitted on 08/31/2016 with cellulitis.  Pharmacy following for Vancomycin dosing and new start of cefepime. Zosyn was D/C after increase of Scr to 1.3 from baseline with concurrent vanc/zosyn regimen. Continue to monitor Scr and cultures. Patient remains afebrile, WBC count trending down.   Plan: Vancomycin 750 q8h (Goal trough 10-15) Cefepime 1gram q8h  Waiting on trough level of vancomycin before giving dose at 1200 on 07/12 d/t incr Scr  Follow Scr, C/x's, and clinical status F/u de-escalation plan/LOT plan post-surgery tomorrow  Temp (24hrs), Avg:98.7 F (37.1 C), Min:98.1 F (36.7 C), Max:99.3 F (37.4 C)   Recent Labs Lab 08/31/16 1215 08/31/16 1233 08/31/16 1750 08/31/16 2050 09/01/16 0435 09/02/16 1135 09/02/16 1137 09/03/16 0340  WBC 16.3*  --   --   --  15.4*  --  13.8* 13.1*  CREATININE 0.73  --   --   --  0.83  --   --  1.32*  LATICACIDVEN  --  1.55 1.2 1.1  --   --   --   --   VANCOTROUGH  --   --   --   --   --  25*  --   --     Estimated Creatinine Clearance: 81.1 mL/min (A) (by C-G formula based on SCr of 1.32 mg/dL (H)).    Antimicrobials this admission: 07/09 Vancomycin >>  07/09 Zosyn  >> 07/12  07/12 Cefepime  >>    Microbiology results: 07/09 Bcx >> NGTD  06/15 MRSA PCR >> negative   Jalene Mullet, Pharm.D. PGY1 Pharmacy Resident 09/03/2016 10:18 AM Main Pharmacy: [phone] Pager: [phone]

## 2016-09-04 ENCOUNTER — Encounter (HOSPITAL_COMMUNITY): Payer: Self-pay | Admitting: Surgery

## 2016-09-04 ENCOUNTER — Encounter (HOSPITAL_COMMUNITY)
Admission: EM | Disposition: A | Discharge status: Home / Self Care | Payer: Self-pay | Source: Home / Self Care | Attending: Family Medicine

## 2016-09-04 ENCOUNTER — Inpatient Hospital Stay (HOSPITAL_COMMUNITY): Payer: Medicare Other | Admitting: Anesthesiology

## 2016-09-04 DIAGNOSIS — D62 Acute posthemorrhagic anemia: Secondary | ICD-10-CM

## 2016-09-04 HISTORY — PX: AMPUTATION: SHX166

## 2016-09-04 LAB — BASIC METABOLIC PANEL
Anion gap: 7 (ref 5–15)
BUN: 25 mg/dL — ABNORMAL HIGH (ref 6–20)
CALCIUM: 7.2 mg/dL — AB (ref 8.9–10.3)
CHLORIDE: 98 mmol/L — AB (ref 101–111)
CO2: 25 mmol/L (ref 22–32)
CREATININE: 1.58 mg/dL — AB (ref 0.44–1.00)
GFR calc Af Amer: 45 mL/min — ABNORMAL LOW (ref >60)
GFR calc non Af Amer: 39 mL/min — ABNORMAL LOW (ref >60)
GLUCOSE: 141 mg/dL — AB (ref 65–99)
Potassium: 3.6 mmol/L (ref 3.5–5.1)
Sodium: 130 mmol/L — ABNORMAL LOW (ref 135–145)

## 2016-09-04 LAB — CBC WITH DIFFERENTIAL/PLATELET
BASOS ABS: 0 10*3/uL (ref 0.0–0.1)
Basophils Relative: 0 %
Eosinophils Absolute: 0.3 10*3/uL (ref 0.0–0.7)
Eosinophils Relative: 2 %
HEMATOCRIT: 21.3 % — AB (ref 36.0–46.0)
Hemoglobin: 6.6 g/dL — CL (ref 12.0–15.0)
LYMPHS ABS: 1.9 10*3/uL (ref 0.7–4.0)
LYMPHS PCT: 14 %
MCH: 18.8 pg — ABNORMAL LOW (ref 26.0–34.0)
MCHC: 31 g/dL (ref 30.0–36.0)
MCV: 60.7 fL — ABNORMAL LOW (ref 78.0–100.0)
MONOS PCT: 11 %
Monocytes Absolute: 1.5 10*3/uL — ABNORMAL HIGH (ref 0.1–1.0)
NEUTROS PCT: 73 %
Neutro Abs: 10 10*3/uL — ABNORMAL HIGH (ref 1.7–7.7)
Platelets: 463 10*3/uL — ABNORMAL HIGH (ref 150–400)
RBC: 3.51 MIL/uL — AB (ref 3.87–5.11)
RDW: 15.8 % — AB (ref 11.5–15.5)
WBC: 13.7 10*3/uL — ABNORMAL HIGH (ref 4.0–10.5)

## 2016-09-04 LAB — POCT I-STAT 4, (NA,K, GLUC, HGB,HCT)
Glucose, Bld: 103 mg/dL — ABNORMAL HIGH (ref 65–99)
HEMATOCRIT: 26 % — AB (ref 36.0–46.0)
Hemoglobin: 8.8 g/dL — ABNORMAL LOW (ref 12.0–15.0)
Potassium: 3.5 mmol/L (ref 3.5–5.1)
Sodium: 135 mmol/L (ref 135–145)

## 2016-09-04 LAB — SURGICAL PCR SCREEN
MRSA, PCR: NEGATIVE
STAPHYLOCOCCUS AUREUS: POSITIVE — AB

## 2016-09-04 LAB — GLUCOSE, CAPILLARY: GLUCOSE-CAPILLARY: 144 mg/dL — AB (ref 65–99)

## 2016-09-04 LAB — VANCOMYCIN, RANDOM: Vancomycin Rm: 21

## 2016-09-04 LAB — PREPARE RBC (CROSSMATCH)

## 2016-09-04 SURGERY — AMPUTATION, ABOVE KNEE
Anesthesia: General | Laterality: Right

## 2016-09-04 MED ORDER — METOCLOPRAMIDE HCL 10 MG PO TABS: 5.0000 mg | ORAL_TABLET | Freq: Three times a day (TID) | ORAL | Status: DC | PRN

## 2016-09-04 MED ORDER — DEXTROSE 5 % IV SOLN
500.0000 mg | Freq: Four times a day (QID) | INTRAVENOUS | Status: DC | PRN
  Filled 2016-09-04: qty 5

## 2016-09-04 MED ORDER — LACTATED RINGERS IV SOLN
INTRAVENOUS | Status: DC | PRN
  Administered 2016-09-04: 17:00:00 via INTRAVENOUS

## 2016-09-04 MED ORDER — POLYETHYLENE GLYCOL 3350 17 G PO PACK: 17.0000 g | PACK | Freq: Every day | ORAL | Status: DC | PRN

## 2016-09-04 MED ORDER — DEXTROSE 5 % IV SOLN
1.0000 g | Freq: Two times a day (BID) | INTRAVENOUS | Status: DC
  Administered 2016-09-04 – 2016-09-06 (×4): 1 g via INTRAVENOUS
  Filled 2016-09-04 (×4): qty 1

## 2016-09-04 MED ORDER — ONDANSETRON HCL 4 MG/2ML IJ SOLN: 4.0000 mg | Freq: Once | INTRAMUSCULAR | Status: DC | PRN

## 2016-09-04 MED ORDER — ACETAMINOPHEN 325 MG PO TABS: 650.0000 mg | ORAL_TABLET | Freq: Four times a day (QID) | ORAL | Status: DC | PRN

## 2016-09-04 MED ORDER — DEXTROSE 5 % IV SOLN
INTRAVENOUS | Status: DC | PRN
  Administered 2016-09-04: 25 ug/min via INTRAVENOUS

## 2016-09-04 MED ORDER — MIDAZOLAM HCL 2 MG/2ML IJ SOLN
INTRAMUSCULAR | Status: AC
  Filled 2016-09-04: qty 2

## 2016-09-04 MED ORDER — DEXTROSE 5 % IV SOLN
INTRAVENOUS | Status: DC | PRN
  Administered 2016-09-04: 3 g via INTRAVENOUS

## 2016-09-04 MED ORDER — HYDROMORPHONE HCL 1 MG/ML IJ SOLN
INTRAMUSCULAR | Status: AC
  Administered 2016-09-04: 0.5 mg via INTRAVENOUS
  Filled 2016-09-04: qty 1

## 2016-09-04 MED ORDER — POLYETHYLENE GLYCOL 3350 17 G PO PACK: 17.0000 g | PACK | Freq: Every day | ORAL | Status: DC

## 2016-09-04 MED ORDER — PROPOFOL 10 MG/ML IV BOLUS
INTRAVENOUS | Status: AC
  Filled 2016-09-04: qty 20

## 2016-09-04 MED ORDER — ONDANSETRON HCL 4 MG PO TABS: 4.0000 mg | ORAL_TABLET | Freq: Four times a day (QID) | ORAL | Status: DC | PRN

## 2016-09-04 MED ORDER — BISACODYL 10 MG RE SUPP: 10.0000 mg | Freq: Every day | RECTAL | Status: DC | PRN

## 2016-09-04 MED ORDER — FENTANYL CITRATE (PF) 250 MCG/5ML IJ SOLN
INTRAMUSCULAR | Status: AC
  Filled 2016-09-04: qty 5

## 2016-09-04 MED ORDER — ACETAMINOPHEN 650 MG RE SUPP: 650.0000 mg | Freq: Four times a day (QID) | RECTAL | Status: DC | PRN

## 2016-09-04 MED ORDER — MAGNESIUM CITRATE PO SOLN: 1.0000 | Freq: Once | ORAL | Status: DC | PRN

## 2016-09-04 MED ORDER — HYDROMORPHONE HCL 1 MG/ML IJ SOLN
INTRAMUSCULAR | Status: AC
  Filled 2016-09-04: qty 1

## 2016-09-04 MED ORDER — HYDROMORPHONE HCL 1 MG/ML IJ SOLN: 0.5000 mg | INTRAMUSCULAR | Status: DC | PRN

## 2016-09-04 MED ORDER — SODIUM CHLORIDE 0.9 % IV SOLN: Freq: Once | INTRAVENOUS | Status: DC

## 2016-09-04 MED ORDER — LACTATED RINGERS IV SOLN
INTRAVENOUS | Status: DC
  Administered 2016-09-04: 16:00:00 via INTRAVENOUS

## 2016-09-04 MED ORDER — 0.9 % SODIUM CHLORIDE (POUR BTL) OPTIME
TOPICAL | Status: DC | PRN
  Administered 2016-09-04: 1000 mL

## 2016-09-04 MED ORDER — METHOCARBAMOL 500 MG PO TABS
ORAL_TABLET | ORAL | Status: AC
  Filled 2016-09-04: qty 1

## 2016-09-04 MED ORDER — HYDROMORPHONE HCL 1 MG/ML IJ SOLN
0.5000 mg | INTRAMUSCULAR | Status: DC | PRN
  Administered 2016-09-04 (×2): 0.5 mg via INTRAVENOUS
  Filled 2016-09-04 (×2): qty 0.5

## 2016-09-04 MED ORDER — FENTANYL CITRATE (PF) 100 MCG/2ML IJ SOLN
50.0000 ug | Freq: Once | INTRAMUSCULAR | Status: AC
Start: 2016-09-04 — End: 2016-09-04
  Administered 2016-09-04: 50 ug via INTRAVENOUS

## 2016-09-04 MED ORDER — CHLORHEXIDINE GLUCONATE 4 % EX LIQD
60.0000 mL | Freq: Once | CUTANEOUS | Status: AC
  Administered 2016-09-04: 4 via TOPICAL
  Filled 2016-09-04: qty 60

## 2016-09-04 MED ORDER — SENNOSIDES-DOCUSATE SODIUM 8.6-50 MG PO TABS
1.0000 | ORAL_TABLET | Freq: Every day | ORAL | Status: DC
  Administered 2016-09-04 – 2016-09-09 (×6): 1 via ORAL
  Filled 2016-09-04 (×6): qty 1

## 2016-09-04 MED ORDER — MIDAZOLAM HCL 5 MG/5ML IJ SOLN
INTRAMUSCULAR | Status: DC | PRN
  Administered 2016-09-04: 2 mg via INTRAVENOUS

## 2016-09-04 MED ORDER — FENTANYL CITRATE (PF) 100 MCG/2ML IJ SOLN
INTRAMUSCULAR | Status: AC
  Administered 2016-09-04: 50 ug via INTRAVENOUS
  Filled 2016-09-04: qty 2

## 2016-09-04 MED ORDER — METOCLOPRAMIDE HCL 5 MG/ML IJ SOLN: 5.0000 mg | Freq: Three times a day (TID) | INTRAMUSCULAR | Status: DC | PRN

## 2016-09-04 MED ORDER — OXYCODONE HCL 5 MG PO TABS
5.0000 mg | ORAL_TABLET | ORAL | Status: DC | PRN
  Administered 2016-09-04 – 2016-09-06 (×12): 10 mg via ORAL
  Filled 2016-09-04 (×11): qty 2

## 2016-09-04 MED ORDER — CHLORHEXIDINE GLUCONATE CLOTH 2 % EX PADS
6.0000 | MEDICATED_PAD | Freq: Every day | CUTANEOUS | Status: AC
  Administered 2016-09-04 – 2016-09-08 (×5): 6 via TOPICAL

## 2016-09-04 MED ORDER — SODIUM CHLORIDE 0.9 % IV SOLN: INTRAVENOUS | Status: DC

## 2016-09-04 MED ORDER — VANCOMYCIN HCL IN DEXTROSE 750-5 MG/150ML-% IV SOLN
750.0000 mg | INTRAVENOUS | Status: DC
  Administered 2016-09-05 – 2016-09-06 (×2): 750 mg via INTRAVENOUS
  Filled 2016-09-04 (×2): qty 150

## 2016-09-04 MED ORDER — METHOCARBAMOL 500 MG PO TABS
500.0000 mg | ORAL_TABLET | Freq: Four times a day (QID) | ORAL | Status: DC | PRN
  Administered 2016-09-04 – 2016-09-10 (×3): 500 mg via ORAL
  Filled 2016-09-04 (×2): qty 1

## 2016-09-04 MED ORDER — LIDOCAINE HCL (CARDIAC) 20 MG/ML IV SOLN
INTRAVENOUS | Status: DC | PRN
Start: 2016-09-04 — End: 2016-09-04
  Administered 2016-09-04: 50 mg via INTRAVENOUS

## 2016-09-04 MED ORDER — CEFAZOLIN SODIUM-DEXTROSE 2-4 GM/100ML-% IV SOLN
2.0000 g | INTRAVENOUS | Status: DC
  Filled 2016-09-04: qty 100

## 2016-09-04 MED ORDER — HYDROMORPHONE HCL 1 MG/ML IJ SOLN
0.2500 mg | INTRAMUSCULAR | Status: DC | PRN
  Administered 2016-09-04 (×4): 0.5 mg via INTRAVENOUS

## 2016-09-04 MED ORDER — PROPOFOL 10 MG/ML IV BOLUS
INTRAVENOUS | Status: DC | PRN
  Administered 2016-09-04: 150 mg via INTRAVENOUS

## 2016-09-04 MED ORDER — MUPIROCIN 2 % EX OINT
1.0000 "application " | TOPICAL_OINTMENT | Freq: Two times a day (BID) | CUTANEOUS | Status: AC
  Administered 2016-09-04 – 2016-09-08 (×10): 1 via NASAL
  Filled 2016-09-04 (×6): qty 22

## 2016-09-04 MED ORDER — ONDANSETRON HCL 4 MG/2ML IJ SOLN: 4.0000 mg | Freq: Four times a day (QID) | INTRAMUSCULAR | Status: DC | PRN

## 2016-09-04 MED ORDER — FENTANYL CITRATE (PF) 100 MCG/2ML IJ SOLN
INTRAMUSCULAR | Status: DC | PRN
  Administered 2016-09-04 (×4): 50 ug via INTRAVENOUS

## 2016-09-04 MED ORDER — HYDROMORPHONE HCL 1 MG/ML IJ SOLN
1.0000 mg | INTRAMUSCULAR | Status: DC | PRN
  Administered 2016-09-04 – 2016-09-06 (×18): 1 mg via INTRAVENOUS
  Filled 2016-09-04 (×20): qty 1

## 2016-09-04 MED ORDER — OXYCODONE HCL 5 MG PO TABS
ORAL_TABLET | ORAL | Status: AC
  Filled 2016-09-04: qty 2

## 2016-09-04 MED ORDER — MEPERIDINE HCL 25 MG/ML IJ SOLN: 6.2500 mg | INTRAMUSCULAR | Status: DC | PRN

## 2016-09-04 MED ORDER — DOCUSATE SODIUM 100 MG PO CAPS
100.0000 mg | ORAL_CAPSULE | Freq: Two times a day (BID) | ORAL | Status: DC
  Administered 2016-09-04: 100 mg via ORAL

## 2016-09-04 SURGICAL SUPPLY — 47 items
BLADE SAW RECIP 87.9 MT (BLADE) ×5 IMPLANT
BLADE SURG 10 STRL SS (BLADE) ×4 IMPLANT
BNDG COHESIVE 6X5 TAN STRL LF (GAUZE/BANDAGES/DRESSINGS) ×6 IMPLANT
BNDG GAUZE ELAST 4 BULKY (GAUZE/BANDAGES/DRESSINGS) IMPLANT
CANISTER WOUND CARE 500ML ATS (WOUND CARE) ×2 IMPLANT
COVER SURGICAL LIGHT HANDLE (MISCELLANEOUS) ×6 IMPLANT
CUFF TOURNIQUET SINGLE 34IN LL (TOURNIQUET CUFF) IMPLANT
CUFF TOURNIQUET SINGLE 44IN (TOURNIQUET CUFF) IMPLANT
DRAIN PENROSE 1/2X12 LTX STRL (WOUND CARE) IMPLANT
DRAPE INCISE IOBAN 66X45 STRL (DRAPES) ×6 IMPLANT
DRAPE U-SHAPE 47X51 STRL (DRAPES) ×3 IMPLANT
DRESSING PREVENA PLUS CUSTOM (GAUZE/BANDAGES/DRESSINGS) IMPLANT
DRSG ADAPTIC 3X8 NADH LF (GAUZE/BANDAGES/DRESSINGS) IMPLANT
DRSG PAD ABDOMINAL 8X10 ST (GAUZE/BANDAGES/DRESSINGS) IMPLANT
DRSG PREVENA PLUS CUSTOM (GAUZE/BANDAGES/DRESSINGS) ×3
DURAPREP 26ML APPLICATOR (WOUND CARE) ×3 IMPLANT
ELECT CAUTERY BLADE 6.4 (BLADE) IMPLANT
ELECT REM PT RETURN 9FT ADLT (ELECTROSURGICAL) ×3
ELECTRODE REM PT RTRN 9FT ADLT (ELECTROSURGICAL) ×1 IMPLANT
EVACUATOR 1/8 PVC DRAIN (DRAIN) IMPLANT
GAUZE SPONGE 4X4 12PLY STRL (GAUZE/BANDAGES/DRESSINGS) ×3 IMPLANT
GLOVE BIOGEL PI IND STRL 9 (GLOVE) ×1 IMPLANT
GLOVE BIOGEL PI INDICATOR 9 (GLOVE) ×2
GLOVE SURG ORTHO 9.0 STRL STRW (GLOVE) ×3 IMPLANT
GOWN STRL REUS W/ TWL XL LVL3 (GOWN DISPOSABLE) ×2 IMPLANT
GOWN STRL REUS W/TWL XL LVL3 (GOWN DISPOSABLE) ×6
KIT BASIN OR (CUSTOM PROCEDURE TRAY) ×3 IMPLANT
KIT ROOM TURNOVER OR (KITS) ×3 IMPLANT
MANIFOLD NEPTUNE II (INSTRUMENTS) ×3 IMPLANT
NS IRRIG 1000ML POUR BTL (IV SOLUTION) ×3 IMPLANT
PACK ORTHO EXTREMITY (CUSTOM PROCEDURE TRAY) ×3 IMPLANT
PAD ARMBOARD 7.5X6 YLW CONV (MISCELLANEOUS) ×3 IMPLANT
SPONGE LAP 18X18 X RAY DECT (DISPOSABLE) ×2 IMPLANT
STAPLER VISISTAT 35W (STAPLE) IMPLANT
STOCKINETTE IMPERVIOUS LG (DRAPES) IMPLANT
SUT ETHILON 2 0 PSLX (SUTURE) ×18 IMPLANT
SUT PDS AB 1 CT  36 (SUTURE)
SUT PDS AB 1 CT 36 (SUTURE) IMPLANT
SUT SILK 2 0 (SUTURE) ×3
SUT SILK 2-0 18XBRD TIE 12 (SUTURE) ×1 IMPLANT
SUT VIC AB 1 CTX 27 (SUTURE) ×3 IMPLANT
TOWEL OR 17X24 6PK STRL BLUE (TOWEL DISPOSABLE) ×3 IMPLANT
TOWEL OR 17X26 10 PK STRL BLUE (TOWEL DISPOSABLE) ×3 IMPLANT
TUBE CONNECTING 20'X1/4 (TUBING) ×1
TUBE CONNECTING 20X1/4 (TUBING) ×1 IMPLANT
WATER STERILE IRR 1000ML POUR (IV SOLUTION) ×3 IMPLANT
YANKAUER SUCT BULB TIP NO VENT (SUCTIONS) ×2 IMPLANT
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
## 2016-09-04 NOTE — H&P (View-Only) (Signed)
Patient ID: Kelly Novak, female   DOB: 07/01/1970, 46 y.o.   MRN: [id number] Postoperative day 1 debridement right leg. All muscles in the leg were nonviable no signs of compartment syndrome. The soft tissue envelope was also nonviable. The thigh also showed a nonviable soft tissue envelope with necrotic soft tissue and muscle. We will plan for an above-the-knee amputation tomorrow Friday. I have discussed the findings with the patient and she is in agreement with proceeding with surgery.

## 2016-09-04 NOTE — Progress Notes (Signed)
Pasrr received: [id number] Kimball LCSWA [phone]

## 2016-09-04 NOTE — Transfer of Care (Signed)
Immediate Anesthesia Transfer of Care Note  Patient: Kelly Novak  Procedure(s) Performed: Procedure(s): Right Above Knee Amputation (Right)  Patient Location: PACU  Anesthesia Type:General  Level of Consciousness: awake, alert  and oriented  Airway & Oxygen Therapy: Patient Spontanous Breathing and Patient connected to nasal cannula oxygen  Post-op Assessment: Report given to RN and Post -op Vital signs reviewed and stable  Post vital signs: Reviewed and stable  Last Vitals:  Vitals:   09/04/16 1432 09/04/16 1506  BP: (!) 164/86 (!) 199/83  Pulse: 99 (!) 104  Resp: 16 18  Temp: 37.1 C 36.9 C    Last Pain:  Vitals:   09/04/16 1506  TempSrc: Oral  PainSc:       Patients Stated Pain Goal: 0 (82/99/37 1696)  Complications: No apparent anesthesia complications

## 2016-09-04 NOTE — Progress Notes (Signed)
OR transport is here to take patient downstairs for surgery.

## 2016-09-04 NOTE — Interval H&P Note (Signed)
History and Physical Interval Note:  09/04/2016 7:04 AM  Kelly Novak  has presented today for surgery, with the diagnosis of Gangrene Right Leg  The various methods of treatment have been discussed with the patient and family. After consideration of risks, benefits and other options for treatment, the patient has consented to  Procedure(s): Right Above Knee Amputation (Right) as a surgical intervention .  The patient's history has been reviewed, patient examined, no change in status, stable for surgery.  I have reviewed the patient's chart and labs.  Questions were answered to the patient's satisfaction.     Newt Minion

## 2016-09-04 NOTE — Progress Notes (Signed)
PT Cancellation Note  Patient Details Name: Kelly Novak MRN: [id number] DOB: Aug 14, 1970   Cancelled Treatment:    Reason Eval/Treat Not Completed: Medical issues which prohibited therapy;Patient at procedure or test/unavailable. Patient receiving blood this am, and patient in OR this pm for AKA.  Will return tomorrow for PT re-evaluation post-op.   Despina Pole 09/04/2016, 8:46 PM Carita Pian. Sanjuana Kava, [place] Pager [phone]

## 2016-09-04 NOTE — Op Note (Signed)
08/31/2016 - 09/04/2016  6:27 PM  PATIENT:  Kelly Novak    PRE-OPERATIVE DIAGNOSIS:  Gangrene Right Leg  POST-OPERATIVE DIAGNOSIS:  Same  PROCEDURE:  Right Above Knee Amputation  SURGEON:  Newt Minion, MD  PHYSICIAN ASSISTANT:None ANESTHESIA:   General  PREOPERATIVE INDICATIONS:  Kelly Novak is a  46 y.o. female with a diagnosis of Gangrene Right Leg who failed conservative measures and elected for surgical management.    The risks benefits and alternatives were discussed with the patient preoperatively including but not limited to the risks of infection, bleeding, nerve injury, cardiopulmonary complications, the need for revision surgery, among others, and the patient was willing to proceed.  OPERATIVE IMPLANTS: Trevino wound VAC  OPERATIVE FINDINGS: No necrotic tissue at the resection margins  OPERATIVE PROCEDURE: Patient brought to the general anesthetic. After adequate anesthesia obtained patient's right lower extremity was prepped and DuraPrep draped into a sterile field a timeout was called. A fishmouth incision was made just proximal to the gangrenous necrotic soft tissue. This was carried sharply down to bone. A Kelly clamp was placed on the vascular bundles these were clamped and suture ligated with 2-0 silk. The fishmouth incision was completed with a knife and electrocautery was used for further hemostasis. The femur was resected with a reciprocating saw. The deep and superficial fascial layers and skin was closed using 2-0 nylon. Staples were also used. A prerenal wound VAC was applied this had a good suction fit patient was extubated taken to the PACU in stable condition.

## 2016-09-04 NOTE — Progress Notes (Signed)
CRITICAL VALUE ALERT  Critical Value:  Hemoglobin 6.6  Date & Time Notied:  09/04/2016  Provider Notified: yes, Lamar Blinks, NP  Orders Received/Actions taken: Awaiting orders

## 2016-09-04 NOTE — Anesthesia Procedure Notes (Signed)
Procedure Name: LMA Insertion Date/Time: 09/04/2016 5:28 PM Performed by: Eligha Bridegroom Pre-anesthesia Checklist: Patient identified, Emergency Drugs available, Suction available, Patient being monitored and Timeout performed Patient Re-evaluated:Patient Re-evaluated prior to induction Oxygen Delivery Method: Circle system utilized Preoxygenation: Pre-oxygenation with 100% oxygen Induction Type: IV induction Ventilation: Mask ventilation without difficulty LMA: LMA inserted LMA Size: 4.0 Number of attempts: 1 Placement Confirmation: positive ETCO2 and breath sounds checked- equal and bilateral Tube secured with: Tape Dental Injury: Teeth and Oropharynx as per pre-operative assessment

## 2016-09-04 NOTE — Progress Notes (Signed)
Pharmacy Antibiotic Note  Kelly Novak is a 46 y.o. female admitted on 08/31/2016 with cellulitis.  Pharmacy has been consulted for Vancomycin dosing.    Repeated random level tonight which was still elevated at 21. Last dose received was this morning at 0330 (~17 hours ago)  Plan: Will change to 750mg  q24 until renal function improves.  Plan to recheck random level over weekend or trough next week  Height: 5\' 7"  (170.2 cm) Weight: (!) 317 lb (143.8 kg) IBW/kg (Calculated) : 61.6  Temp (24hrs), Avg:98.6 F (37 C), Min:98 F (36.7 C), Max:98.8 F (37.1 C)   Recent Labs Lab 08/31/16 1215 08/31/16 1233 08/31/16 1750 08/31/16 2050 09/01/16 0435 09/02/16 1135 09/02/16 1137 09/03/16 0340 09/03/16 1217 09/04/16 0410 09/04/16 2030  WBC 16.3*  --   --   --  15.4*  --  13.8* 13.1*  --  13.7*  --   CREATININE 0.73  --   --   --  0.83  --   --  1.32*  --  1.58*  --   LATICACIDVEN  --  1.55 1.2 1.1  --   --   --   --   --   --   --   VANCOTROUGH  --   --   --   --   --  25*  --   --  19  --   --   VANCORANDOM  --   --   --   --   --   --   --   --   --   --  21    Estimated Creatinine Clearance: 67.8 mL/min (A) (by C-G formula based on SCr of 1.58 mg/dL (H)).    Allergies  Allergen Reactions  . Tape Itching and Rash    "took skin off"     Thank you for allowing pharmacy to be a part of this patient's care.  Erin Hearing PharmD., BCPS Clinical Pharmacist Pager [phone] 09/04/2016 9:47 PM

## 2016-09-04 NOTE — Anesthesia Preprocedure Evaluation (Signed)
Anesthesia Evaluation  Patient identified by MRN, date of birth, ID band Patient awake    Reviewed: Allergy & Precautions, NPO status , Patient's Chart, lab work & pertinent test results  Airway Mallampati: II  TM Distance: >3 FB Neck ROM: Full    Dental   Pulmonary shortness of breath, former smoker,    Pulmonary exam normal        Cardiovascular hypertension, Pt. on medications Normal cardiovascular exam     Neuro/Psych Anxiety Depression Bipolar Disorder CVA    GI/Hepatic GERD  Medicated and Controlled,  Endo/Other    Renal/GU      Musculoskeletal   Abdominal   Peds  Hematology   Anesthesia Other Findings   Reproductive/Obstetrics                             Anesthesia Physical Anesthesia Plan  ASA: III  Anesthesia Plan: General   Post-op Pain Management:    Induction: Intravenous  PONV Risk Score and Plan: 3 and Ondansetron, Dexamethasone, Propofol and Midazolam  Airway Management Planned: LMA  Additional Equipment:   Intra-op Plan:   Post-operative Plan: Extubation in OR  Informed Consent: I have reviewed the patients History and Physical, chart, labs and discussed the procedure including the risks, benefits and alternatives for the proposed anesthesia with the patient or authorized representative who has indicated his/her understanding and acceptance.     Plan Discussed with: CRNA and Surgeon  Anesthesia Plan Comments:         Anesthesia Quick Evaluation

## 2016-09-05 LAB — CULTURE, BLOOD (ROUTINE X 2)
Culture: NO GROWTH
Culture: NO GROWTH
Special Requests: ADEQUATE
Special Requests: ADEQUATE

## 2016-09-05 LAB — CBC WITH DIFFERENTIAL/PLATELET
Basophils Absolute: 0 10*3/uL (ref 0.0–0.1)
Basophils Relative: 0 %
EOS PCT: 1 %
Eosinophils Absolute: 0.1 10*3/uL (ref 0.0–0.7)
HEMATOCRIT: 22.9 % — AB (ref 36.0–46.0)
HEMOGLOBIN: 7.4 g/dL — AB (ref 12.0–15.0)
LYMPHS ABS: 1.7 10*3/uL (ref 0.7–4.0)
LYMPHS PCT: 13 %
MCH: 20.3 pg — ABNORMAL LOW (ref 26.0–34.0)
MCHC: 32.3 g/dL (ref 30.0–36.0)
MCV: 62.7 fL — AB (ref 78.0–100.0)
MONOS PCT: 6 %
Monocytes Absolute: 0.8 10*3/uL (ref 0.1–1.0)
Neutro Abs: 10.8 10*3/uL — ABNORMAL HIGH (ref 1.7–7.7)
Neutrophils Relative %: 80 %
Platelets: 468 10*3/uL — ABNORMAL HIGH (ref 150–400)
RBC: 3.65 MIL/uL — AB (ref 3.87–5.11)
RDW: 18.8 % — AB (ref 11.5–15.5)
WBC: 13.4 10*3/uL — AB (ref 4.0–10.5)

## 2016-09-05 LAB — BASIC METABOLIC PANEL
ANION GAP: 11 (ref 5–15)
BUN: 34 mg/dL — AB (ref 6–20)
CHLORIDE: 98 mmol/L — AB (ref 101–111)
CO2: 23 mmol/L (ref 22–32)
Calcium: 8.5 mg/dL — ABNORMAL LOW (ref 8.9–10.3)
Creatinine, Ser: 2.01 mg/dL — ABNORMAL HIGH (ref 0.44–1.00)
GFR calc Af Amer: 34 mL/min — ABNORMAL LOW (ref >60)
GFR calc non Af Amer: 29 mL/min — ABNORMAL LOW (ref >60)
Glucose, Bld: 135 mg/dL — ABNORMAL HIGH (ref 65–99)
POTASSIUM: 3.7 mmol/L (ref 3.5–5.1)
SODIUM: 132 mmol/L — AB (ref 135–145)

## 2016-09-05 LAB — SODIUM, URINE, RANDOM: SODIUM UR: 61 mmol/L

## 2016-09-05 LAB — CREATININE, URINE, RANDOM: CREATININE, URINE: 72.77 mg/dL

## 2016-09-05 MED ORDER — CARVEDILOL 6.25 MG PO TABS
6.2500 mg | ORAL_TABLET | Freq: Two times a day (BID) | ORAL | Status: DC
  Administered 2016-09-05 – 2016-09-10 (×12): 6.25 mg via ORAL
  Filled 2016-09-05 (×12): qty 1

## 2016-09-05 MED ORDER — FAMOTIDINE 20 MG PO TABS: 20.0000 mg | ORAL_TABLET | Freq: Every day | ORAL | Status: DC

## 2016-09-05 MED ORDER — FAMOTIDINE 20 MG PO TABS
20.0000 mg | ORAL_TABLET | Freq: Every day | ORAL | Status: DC
  Administered 2016-09-05 – 2016-09-10 (×6): 20 mg via ORAL
  Filled 2016-09-05 (×6): qty 1

## 2016-09-05 NOTE — Progress Notes (Signed)
Pt. BP 206/96 manual BP,pain 10/10. Scheduled hydralazine given and diluladid given. MD notified. Will continue to monitor.

## 2016-09-05 NOTE — Progress Notes (Signed)
PT Cancellation Note  Patient Details Name: Kelly Novak MRN: [id number] DOB: April 12, 1970   Cancelled Treatment:    Reason Eval/Treat Not Completed: Patient declined, no reason specified. Pt refusing to participate in therapy at this time. PT will continue to f/u with pt as available.   [place] 09/05/2016, 9:07 AM

## 2016-09-05 NOTE — Progress Notes (Signed)
Patient complaining of indigestion. MD notified.  "60w05. Pt. continues to complain of indigestion. Requesting medication. Thanks." MD ordering, Pepcid.
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
## 2016-09-05 NOTE — Progress Notes (Signed)
Patient ID: Kelly Novak, female   DOB: Nov 03, 1970, 46 y.o.   MRN: [id number] Tolerated surgery yesterday on right lower extremity by Dr. Sharol Given.  Currently has a wound vac over a closed incision on her right amputation stump to allow for hopefully soft-tissue healing.

## 2016-09-05 NOTE — Anesthesia Postprocedure Evaluation (Signed)
Anesthesia Post Note  Patient: Kelly Novak  Procedure(s) Performed: Procedure(s) (LRB): Right Above Knee Amputation (Right)     Patient location during evaluation: PACU Anesthesia Type: General Level of consciousness: awake and alert Pain management: pain level controlled Vital Signs Assessment: post-procedure vital signs reviewed and stable Respiratory status: spontaneous breathing, nonlabored ventilation, respiratory function stable and patient connected to nasal cannula oxygen Cardiovascular status: blood pressure returned to baseline and stable Postop Assessment: no signs of nausea or vomiting Anesthetic complications: no    Last Vitals:  Vitals:   09/05/16 0659 09/05/16 0745  BP: (!) 185/94 (!) 190/100  Pulse:    Resp:    Temp:      Last Pain:  Vitals:   09/05/16 0758  TempSrc:   PainSc: 10-Worst pain ever                 Jabari Swoveland DAVID

## 2016-09-05 NOTE — Progress Notes (Signed)
Received patient form PACU nurse.  Patient is alert and oriented x 4.  IV intact to the right forearm.  Wound vac attached to the right AKA at suction 125.  Vital signs BP: 203.90 HR: 104.  Will administer evening BP meds.  Complaint of surgical pain 10/10.  Will administer PRN order of Dilaudid.  Placed the call light within reach.  Will continue to monitor the patient

## 2016-09-06 ENCOUNTER — Encounter (HOSPITAL_COMMUNITY): Payer: Self-pay | Admitting: Orthopedic Surgery

## 2016-09-06 DIAGNOSIS — N179 Acute kidney failure, unspecified: Secondary | ICD-10-CM

## 2016-09-06 LAB — BASIC METABOLIC PANEL
ANION GAP: 9 (ref 5–15)
BUN: 36 mg/dL — ABNORMAL HIGH (ref 6–20)
CALCIUM: 9 mg/dL (ref 8.9–10.3)
CO2: 27 mmol/L (ref 22–32)
Chloride: 97 mmol/L — ABNORMAL LOW (ref 101–111)
Creatinine, Ser: 1.94 mg/dL — ABNORMAL HIGH (ref 0.44–1.00)
GFR, EST AFRICAN AMERICAN: 35 mL/min — AB (ref >60)
GFR, EST NON AFRICAN AMERICAN: 30 mL/min — AB (ref >60)
GLUCOSE: 149 mg/dL — AB (ref 65–99)
Potassium: 3.4 mmol/L — ABNORMAL LOW (ref 3.5–5.1)
SODIUM: 133 mmol/L — AB (ref 135–145)

## 2016-09-06 LAB — CBC
HCT: 22.3 % — ABNORMAL LOW (ref 36.0–46.0)
HEMOGLOBIN: 6.8 g/dL — AB (ref 12.0–15.0)
MCH: 19.4 pg — ABNORMAL LOW (ref 26.0–34.0)
MCHC: 30.5 g/dL (ref 30.0–36.0)
MCV: 63.7 fL — ABNORMAL LOW (ref 78.0–100.0)
Platelets: 460 10*3/uL — ABNORMAL HIGH (ref 150–400)
RBC: 3.5 MIL/uL — AB (ref 3.87–5.11)
RDW: 18.6 % — ABNORMAL HIGH (ref 11.5–15.5)
WBC: 13.7 10*3/uL — ABNORMAL HIGH (ref 4.0–10.5)

## 2016-09-06 LAB — VANCOMYCIN, TROUGH: Vancomycin Tr: 13 ug/mL — ABNORMAL LOW (ref 15–20)

## 2016-09-06 LAB — PREPARE RBC (CROSSMATCH)

## 2016-09-06 MED ORDER — SODIUM CHLORIDE 0.9 % IV SOLN
Freq: Once | INTRAVENOUS | Status: AC
  Administered 2016-09-06: 14:00:00 via INTRAVENOUS
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
## 2016-09-06 NOTE — Progress Notes (Signed)
Pharmacy Antibiotic Note  Kelly Novak is a 46 y.o. female admitted on 08/31/2016 with cellulitis.  Pharmacy has been consulted for Vancomycin dosing.    VT was therapeutic at 13 this am.  Plan: Continue vancomycin 750mg  IV Q24h Monitor clinical picture, renal function, VT prn F/U C&S, abx deescalation / LOT  Height: 5\' 7"  (170.2 cm) Weight: 296 lb 11.2 oz (134.6 kg) IBW/kg (Calculated) : 61.6  Temp (24hrs), Avg:98.5 F (36.9 C), Min:98.2 F (36.8 C), Max:98.8 F (37.1 C)   Recent Labs Lab 08/31/16 1215 08/31/16 1233 08/31/16 1750 08/31/16 2050 09/01/16 0435  09/02/16 1137 09/03/16 0340 09/03/16 1217 09/04/16 0410 09/04/16 2030 09/05/16 0610 09/06/16 0435  WBC 16.3*  --   --   --  15.4*  --  13.8* 13.1*  --  13.7*  --  13.4*  --   CREATININE 0.73  --   --   --  0.83  --   --  1.32*  --  1.58*  --  2.01*  --   LATICACIDVEN  --  1.55 1.2 1.1  --   --   --   --   --   --   --   --   --   VANCOTROUGH  --   --   --   --   --   < >  --   --  19  --   --   --  13*  VANCORANDOM  --   --   --   --   --   --   --   --   --   --  21  --   --   < > = values in this interval not displayed.  Estimated Creatinine Clearance: 51.2 mL/min (A) (by C-G formula based on SCr of 2.01 mg/dL (H)).    Allergies  Allergen Reactions  . Tape Itching and Rash    "took skin off"     Thank you for allowing pharmacy to be a part of this patient's care.  Elenor Quinones, PharmD, BCPS Clinical Pharmacist Pager [phone] 09/06/2016 5:50 AM

## 2016-09-06 NOTE — Progress Notes (Signed)
CRITICAL VALUE ALERT  Critical Value:  HgB   Date & Time Notied:  09/06/2016 1122  Provider Notified: yes, Nettey  Orders Received/Actions taken: 2 Units of blood
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
## 2016-09-07 DIAGNOSIS — M726 Necrotizing fasciitis: Principal | ICD-10-CM

## 2016-09-07 DIAGNOSIS — Z89611 Acquired absence of right leg above knee: Secondary | ICD-10-CM

## 2016-09-07 LAB — TYPE AND SCREEN
ABO/RH(D): O POS
Antibody Screen: NEGATIVE
Unit division: 0
Unit division: 0
Unit division: 0

## 2016-09-07 LAB — BASIC METABOLIC PANEL
Anion gap: 7 (ref 5–15)
BUN: 38 mg/dL — AB (ref 6–20)
CHLORIDE: 101 mmol/L (ref 101–111)
CO2: 27 mmol/L (ref 22–32)
Calcium: 8.4 mg/dL — ABNORMAL LOW (ref 8.9–10.3)
Creatinine, Ser: 1.96 mg/dL — ABNORMAL HIGH (ref 0.44–1.00)
GFR calc Af Amer: 35 mL/min — ABNORMAL LOW (ref >60)
GFR calc non Af Amer: 30 mL/min — ABNORMAL LOW (ref >60)
GLUCOSE: 104 mg/dL — AB (ref 65–99)
POTASSIUM: 3.7 mmol/L (ref 3.5–5.1)
Sodium: 135 mmol/L (ref 135–145)

## 2016-09-07 LAB — BPAM RBC
Blood Product Expiration Date: 201807252359
Blood Product Expiration Date: 201808022359
Blood Product Expiration Date: 201808052359
ISSUE DATE / TIME: 201807131152
ISSUE DATE / TIME: 201807151349
ISSUE DATE / TIME: 201807152104
Unit Type and Rh: 5100
Unit Type and Rh: 5100
Unit Type and Rh: 5100

## 2016-09-07 LAB — CBC
HEMATOCRIT: 24.8 % — AB (ref 36.0–46.0)
Hemoglobin: 7.7 g/dL — ABNORMAL LOW (ref 12.0–15.0)
MCH: 20.9 pg — ABNORMAL LOW (ref 26.0–34.0)
MCHC: 31 g/dL (ref 30.0–36.0)
MCV: 67.4 fL — AB (ref 78.0–100.0)
Platelets: 451 10*3/uL — ABNORMAL HIGH (ref 150–400)
RBC: 3.68 MIL/uL — ABNORMAL LOW (ref 3.87–5.11)
RDW: 21.1 % — AB (ref 11.5–15.5)
WBC: 13.3 10*3/uL — ABNORMAL HIGH (ref 4.0–10.5)

## 2016-09-07 MED ORDER — HYDROMORPHONE HCL 1 MG/ML IJ SOLN
1.0000 mg | INTRAMUSCULAR | Status: DC | PRN
  Administered 2016-09-07 – 2016-09-10 (×15): 1 mg via INTRAVENOUS
  Filled 2016-09-07 (×15): qty 1

## 2016-09-07 MED ORDER — POLYETHYLENE GLYCOL 3350 17 G PO PACK
17.0000 g | PACK | Freq: Every day | ORAL | Status: DC
  Administered 2016-09-07 – 2016-09-10 (×4): 17 g via ORAL
  Filled 2016-09-07 (×4): qty 1

## 2016-09-07 MED ORDER — HYDROMORPHONE HCL 1 MG/ML IJ SOLN
1.0000 mg | INTRAMUSCULAR | Status: DC | PRN
  Administered 2016-09-07 (×2): 1 mg via INTRAVENOUS
  Filled 2016-09-07 (×2): qty 1

## 2016-09-07 MED ORDER — OXYCODONE HCL 5 MG PO TABS
5.0000 mg | ORAL_TABLET | ORAL | Status: DC | PRN
  Administered 2016-09-07 – 2016-09-08 (×4): 10 mg via ORAL
  Filled 2016-09-07 (×4): qty 2
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
## 2016-09-07 NOTE — Progress Notes (Signed)
Patient had ordered 0.3mg  catapres and 100mg  of hydralazine and BP was much lower than baseline of 140s/70s and above. Current BPs running in the 115/55 and 123/51 and 109/49. In addition to BP meds, pt was also receiving PRN dilaudid and PRN oxy. Schorr,NP paged. NP returned page and changed parameters for patient to receive her PRN pain meds and also informed Ronalee Belts, RN to hold nighttime BP meds. Will hold BP meds and continue to monitor and treat per MD orders.

## 2016-09-07 NOTE — Clinical Social Work Note (Signed)
Clinical Social Work Assessment  Patient Details  Name: Kelly Novak MRN: [id number] Date of Birth: July 25, 1970  Date of referral:  09/07/16               Reason for consult:  Facility Placement                Permission sought to share information with:  Facility Sport and exercise psychologist, Family Supports Permission granted to share information::  Yes, Verbal Permission Granted  Name::     [place]  Agency::  SNFs  Relationship::  Brother  Contact Information:  [phone]  Housing/Transportation Living arrangements for the past 2 months:  Apartment Source of Information:  Patient, Other (Comment Required) (Brother) Patient Interpreter Needed:  None Criminal Activity/Legal Involvement Pertinent to Current Situation/Hospitalization:  No - Comment as needed Significant Relationships:  Siblings Lives with:  Self Do you feel safe going back to the place where you live?  No Need for family participation in patient care:  Yes (Comment)  Care giving concerns:  CSW received consult for possible SNF placement at time of discharge. CSW spoke with patient and her brother regarding PT recommendation of SNF vs CIR placement at time of discharge. Due to patient's current physical needs and fall risk, patient's brother is very worried about her discharge plan since patient lives alone and does not have insurance. Patient expressed understanding of PT recommendation and is agreeable to SNF placement at time of discharge. CSW to continue to follow and assist with discharge planning needs.   Social Worker assessment / plan:  CSW spoke with patient and her brother concerning possibility of rehab at [facility] before returning home if CIR is unable to accept patient.  Employment status:  Unemployed Forensic scientist:  Self Pay (Medicaid Pending) PT Recommendations:  Inpatient Rehab Consult Information / Referral to community resources:  [place]  Patient/Family's Response to care:  Patient  and her brother recognize need for rehab before returning home and is agreeable to a SNF if CIR is unable to accept her. CSW explained that lack of insurance may mean that patient may have to go to a SNF out of county. Patient's brother reported understanding and is helping patient with her disability case. He also inquired about trying to find out why patient did not receive medicine the first time she was discharged from the ED. CSW directed him to the unit Director.   Patient/Family's Understanding of and Emotional Response to Diagnosis, Current Treatment, and Prognosis:  Patient/family is realistic regarding therapy needs and expressed being hopeful for SNF placement. Patient's brother expressed understanding of CSW role and discharge process and patient's medical condition. No questions/concerns about plan or treatment.    Emotional Assessment Appearance:  Appears stated age Attitude/Demeanor/Rapport:  Other (lethargic) Affect (typically observed):  Flat, Appropriate Orientation:  Oriented to Self, Oriented to Place, Oriented to  Time, Oriented to Situation Alcohol / Substance use:  Illicit Drugs Psych involvement (Current and /or in the community):  No (Comment)  Discharge Needs  Concerns to be addressed:  Care Coordination Readmission within the last 30 days:  No Current discharge risk:  Physical Impairment Barriers to Discharge:  Continued Medical Work up   [employer], [place] 09/07/2016, 3:42 PM
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
## 2016-09-08 LAB — CBC
HCT: 22.3 % — ABNORMAL LOW (ref 36.0–46.0)
Hemoglobin: 7.1 g/dL — ABNORMAL LOW (ref 12.0–15.0)
MCH: 21.6 pg — ABNORMAL LOW (ref 26.0–34.0)
MCHC: 31.8 g/dL (ref 30.0–36.0)
MCV: 67.8 fL — AB (ref 78.0–100.0)
PLATELETS: 450 10*3/uL — AB (ref 150–400)
RBC: 3.29 MIL/uL — ABNORMAL LOW (ref 3.87–5.11)
RDW: 21.6 % — AB (ref 11.5–15.5)
WBC: 11.7 10*3/uL — AB (ref 4.0–10.5)

## 2016-09-08 LAB — RETICULOCYTES
RBC.: 3.44 MIL/uL — ABNORMAL LOW (ref 3.87–5.11)
RETIC CT PCT: 1.8 % (ref 0.4–3.1)
Retic Count, Absolute: 61.9 10*3/uL (ref 19.0–186.0)

## 2016-09-08 LAB — BASIC METABOLIC PANEL
Anion gap: 8 (ref 5–15)
BUN: 36 mg/dL — AB (ref 6–20)
CALCIUM: 7.8 mg/dL — AB (ref 8.9–10.3)
CHLORIDE: 104 mmol/L (ref 101–111)
CO2: 24 mmol/L (ref 22–32)
CREATININE: 1.67 mg/dL — AB (ref 0.44–1.00)
GFR, EST AFRICAN AMERICAN: 42 mL/min — AB (ref >60)
GFR, EST NON AFRICAN AMERICAN: 36 mL/min — AB (ref >60)
Glucose, Bld: 94 mg/dL (ref 65–99)
Potassium: 4.3 mmol/L (ref 3.5–5.1)
SODIUM: 136 mmol/L (ref 135–145)

## 2016-09-08 LAB — HEMOGLOBIN AND HEMATOCRIT, BLOOD
HCT: 23.2 % — ABNORMAL LOW (ref 36.0–46.0)
HEMOGLOBIN: 7.4 g/dL — AB (ref 12.0–15.0)

## 2016-09-08 MED ORDER — OXYCODONE HCL 5 MG PO TABS
5.0000 mg | ORAL_TABLET | ORAL | Status: DC | PRN
  Administered 2016-09-08 – 2016-09-10 (×9): 10 mg via ORAL
  Filled 2016-09-08 (×9): qty 2
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
## 2016-09-08 NOTE — Progress Notes (Signed)
Orders to DC wound Vac placed, and apply dry drsg per Dr Sharol Given. Per Dr Lonny Prude, [place] consult was placed, and will leave Wound VAC on until [place] sees Pt and can recommend proper drsg application. Will continue to assess.

## 2016-09-09 LAB — BASIC METABOLIC PANEL
Anion gap: 9 (ref 5–15)
BUN: 34 mg/dL — ABNORMAL HIGH (ref 6–20)
CALCIUM: 7.6 mg/dL — AB (ref 8.9–10.3)
CO2: 22 mmol/L (ref 22–32)
CREATININE: 1.72 mg/dL — AB (ref 0.44–1.00)
Chloride: 104 mmol/L (ref 101–111)
GFR calc Af Amer: 41 mL/min — ABNORMAL LOW (ref >60)
GFR calc non Af Amer: 35 mL/min — ABNORMAL LOW (ref >60)
GLUCOSE: 103 mg/dL — AB (ref 65–99)
Potassium: 4.8 mmol/L (ref 3.5–5.1)
Sodium: 135 mmol/L (ref 135–145)

## 2016-09-09 LAB — CBC
HCT: 23 % — ABNORMAL LOW (ref 36.0–46.0)
HEMOGLOBIN: 7.3 g/dL — AB (ref 12.0–15.0)
MCH: 21.6 pg — AB (ref 26.0–34.0)
MCHC: 31.7 g/dL (ref 30.0–36.0)
MCV: 68 fL — ABNORMAL LOW (ref 78.0–100.0)
Platelets: 457 10*3/uL — ABNORMAL HIGH (ref 150–400)
RBC: 3.38 MIL/uL — ABNORMAL LOW (ref 3.87–5.11)
RDW: 22.1 % — ABNORMAL HIGH (ref 11.5–15.5)
WBC: 12.3 10*3/uL — ABNORMAL HIGH (ref 4.0–10.5)

## 2016-09-09 NOTE — Progress Notes (Addendum)
7/19: Medical team confirmed pt was discharging home. CSW signing off.  7/18: CSW spoke with patient about discharge planning. Patient is adamant about discharging home. CSW asked what supports she has at home and she reported her son, daughter, and brother would tag team to help her. She understands that hospital could provide a 30 day stay at an out of town SNF facility but she does not want to be far from home. She reported understanding that she will not receive any help at home due to lack of insurance.   Percell Locus Ryanne Morand LCSWA [phone]
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
## 2016-09-09 NOTE — Progress Notes (Addendum)
Per MD order wound vac was D/C. Wound clean, dry, intact. Dry dressing was applied. Will continue to monitor.

## 2016-09-09 NOTE — Progress Notes (Signed)
Rehab admissions - I met with patient.  She has no insurance.  I explained inpatient rehab.  Patient prefers to discharge directly home when ready.  I have let social worker know.  Call me for questions.  #[phone]

## 2016-09-10 LAB — CBC
HEMATOCRIT: 24.2 % — AB (ref 36.0–46.0)
HEMOGLOBIN: 7.5 g/dL — AB (ref 12.0–15.0)
MCH: 21.2 pg — AB (ref 26.0–34.0)
MCHC: 31 g/dL (ref 30.0–36.0)
MCV: 68.6 fL — AB (ref 78.0–100.0)
Platelets: 538 10*3/uL — ABNORMAL HIGH (ref 150–400)
RBC: 3.53 MIL/uL — ABNORMAL LOW (ref 3.87–5.11)
RDW: 21.9 % — ABNORMAL HIGH (ref 11.5–15.5)
WBC: 12.8 10*3/uL — ABNORMAL HIGH (ref 4.0–10.5)

## 2016-09-10 LAB — BASIC METABOLIC PANEL
ANION GAP: 7 (ref 5–15)
BUN: 31 mg/dL — ABNORMAL HIGH (ref 6–20)
CHLORIDE: 107 mmol/L (ref 101–111)
CO2: 23 mmol/L (ref 22–32)
Calcium: 7.7 mg/dL — ABNORMAL LOW (ref 8.9–10.3)
Creatinine, Ser: 1.78 mg/dL — ABNORMAL HIGH (ref 0.44–1.00)
GFR calc Af Amer: 39 mL/min — ABNORMAL LOW (ref >60)
GFR calc non Af Amer: 34 mL/min — ABNORMAL LOW (ref >60)
GLUCOSE: 119 mg/dL — AB (ref 65–99)
POTASSIUM: 4.6 mmol/L (ref 3.5–5.1)
Sodium: 137 mmol/L (ref 135–145)

## 2016-09-10 LAB — PREPARE RBC (CROSSMATCH)

## 2016-09-10 MED ORDER — OXYCODONE HCL 5 MG PO TABS: 5.0000 mg | ORAL_TABLET | ORAL | 0 refills | Status: DC | PRN

## 2016-09-10 MED ORDER — ADULT MULTIVITAMIN W/MINERALS CH: 1.0000 | ORAL_TABLET | Freq: Every day | ORAL | 0 refills | Status: DC

## 2016-09-10 MED ORDER — DOCUSATE SODIUM 100 MG PO CAPS: 100.0000 mg | ORAL_CAPSULE | Freq: Two times a day (BID) | ORAL | 0 refills | Status: DC

## 2016-09-10 MED ORDER — FAMOTIDINE 20 MG PO TABS: 20.0000 mg | ORAL_TABLET | Freq: Every day | ORAL | 0 refills | Status: DC

## 2016-09-10 MED ORDER — ONDANSETRON HCL 4 MG PO TABS: 4.0000 mg | ORAL_TABLET | Freq: Four times a day (QID) | ORAL | 0 refills | Status: DC | PRN

## 2016-09-10 MED ORDER — CARVEDILOL 6.25 MG PO TABS: 6.2500 mg | ORAL_TABLET | Freq: Two times a day (BID) | ORAL | 0 refills | Status: DC

## 2016-09-10 MED ORDER — PRO-STAT SUGAR FREE PO LIQD: 30.0000 mL | Freq: Two times a day (BID) | ORAL | 0 refills | Status: DC

## 2016-09-10 MED ORDER — SODIUM CHLORIDE 0.9 % IV SOLN
Freq: Once | INTRAVENOUS | Status: AC
  Administered 2016-09-10: 09:00:00 via INTRAVENOUS

## 2016-09-10 MED ORDER — ACETAMINOPHEN 325 MG PO TABS: 650.0000 mg | ORAL_TABLET | Freq: Four times a day (QID) | ORAL | 0 refills | Status: DC | PRN

## 2016-09-10 MED ORDER — HYDROMORPHONE HCL 1 MG/ML IJ SOLN
2.0000 mg | INTRAMUSCULAR | Status: DC | PRN
  Administered 2016-09-10 (×3): 2 mg via INTRAVENOUS
  Filled 2016-09-10 (×3): qty 2

## 2016-09-10 NOTE — Discharge Instructions (Signed)

## 2016-09-10 NOTE — Care Management Note (Addendum)
Case Management Note  Patient Details  Name: Kelly Novak MRN: [id number] Date of Birth: 11/22/1970  Subjective/Objective:    Necrotizing fascitis, anemia, right lower leg crush injury, right AKA, CHF                Action/Plan: Discharge Planning:  NCM spoke to pt and states she wants to dc home with family. States her son and brother will be able to assist at home. Provided contact number for brother, Cathlean Sauer # [phone]. Contacted brother states pt has applied for disability. States she is close to a decision. States she will be staying with son and his girlfriend. Pt will need manual wheelchair, shower chair and bedside commode for home. Contacted [facility] for DME and HH. States they will confirm that pt will be eligible for [facility]. Appt arranged for [facility] on 09/16/2016 at 3 pm. Explained MATCH program with $3 copay and utilization once per year. Pt is refusing placement at this time for fear SNF will take her award money for SS disability.   Spoke to [facility] and they will not be able to provide [facility] through charity program. Pt is declining placement and desires to go home. [facility] will provide DME for home.    Expected Discharge Date:  09/10/16               Expected Discharge Plan:  [place]  In-House Referral:  Clinical Social Work  Discharge planning Services  CM Consult  Post Acute Care Choice:  Home Health Choice offered to:  Patient  DME Arranged:  3-N-1, Youth worker wheelchair with seat cushion DME Agency:  [place]:  RN, PT, OT, Nurse's Aide [place] Agency:  [place]  Status of Service:  Completed, signed off  If discussed at [place] of Stay Meetings, dates discussed:    Additional Comments:  Erenest Rasher, RN 09/10/2016, 3:11 PM

## 2016-09-10 NOTE — Progress Notes (Signed)
Patient discharge teaching given, including activity, diet, follow-up appoints, and medications. Patient verbalized understanding of all discharge instructions. IV access was d/c'd. Vitals are stable. Skin is intact except as charted in most recent assessments. Pt to be escorted out by NT, to be driven home by family. 
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
## 2016-09-11 LAB — TYPE AND SCREEN
ABO/RH(D): O POS
Antibody Screen: NEGATIVE
UNIT DIVISION: 0

## 2016-09-11 LAB — BPAM RBC
Blood Product Expiration Date: 201808092359
ISSUE DATE / TIME: 201807191111
Unit Type and Rh: 5100

## 2016-09-16 ENCOUNTER — Telehealth: Payer: Self-pay | Admitting: *Deleted

## 2016-09-16 ENCOUNTER — Inpatient Hospital Stay: Payer: Self-pay

## 2016-09-16 NOTE — Progress Notes (Signed)
Patient ID: Kelly Novak, female   DOB: 28-Jul-1970, 46 y.o.   MRN: [id number]    Kelly Novak, is a 46 y.o. female  MWN:[id number]  QIH:[id number]  DOB - 1970/08/29  Subjective:  Chief Complaint and HPI: Kelly Novak is a 46 y.o. female here today to establish care and for a follow up visit After recent hospitalization 08/31/2016-09/10/2016 for necrotizing fasciitis and RAKA on 09/04/2016 while admitted.  She had had a crush injury to her R leg 5 days PTA.  See hospital note-she was seen in the ED for the injury on 08/26/2016-see notes.    She presents today not having had f/up with ortho yet and she hasn't scheduled an appointment.  Home health has not been out to her house.  She is in a lot of pain.  Her bandage is bleeding through. She denies f/c.  She has not gotten all her BP meds filled nor has she taken them today.  She hasn't been taking hydralazine at all/didn't get it filled.  She denies CP/SOB  ED/Hospital notes reviewed.    ROS:   Constitutional:  No f/c, No night sweats, No unexplained weight loss. EENT:  No vision changes, No blurry vision, No hearing changes. No mouth, throat, or ear problems.  Respiratory: No cough, No SOB Cardiac: No CP, no palpitations GI:  No abd pain, No N/V/D. GU: No Urinary s/sx Musculoskeletal: +pain Neuro: No headache, no dizziness, no motor weakness.  Skin: No rash Endocrine:  No polydipsia. No polyuria.  Psych: Denies SI/HI  Problem  S/P Aka (Above Knee Amputation) Unilateral, Right ([facility])    ALLERGIES: Allergies  Allergen Reactions  . Tape Itching and Rash    "took skin off"    PAST MEDICAL HISTORY: Past Medical History:  Diagnosis Date  . Anemia   . Anxiety   . Arthritis   . Bipolar 1 disorder ([place])   . Blood dyscrasia    sickle cell trait  . Chronic back pain   . Chronic systolic (congestive) heart failure ([place])   . GERD (gastroesophageal reflux disease)   . Hypertension   . Obesity   . Polysubstance abuse   . Shortness of  breath    on exertion due to weight  . Stroke [facility])     MEDICATIONS AT HOME: Prior to Admission medications   Medication Sig Start Date End Date Taking? Authorizing Provider  acetaminophen (TYLENOL) 325 MG tablet Take 2 tablets (650 mg total) by mouth every 6 (six) hours as needed for mild pain (or Fever >/= 101). 09/10/16   Robbie Lis, MD  Amino Acids-Protein Hydrolys (FEEDING SUPPLEMENT, PRO-STAT SUGAR FREE 64,) LIQD Take 30 mLs by mouth 2 (two) times daily. 09/17/16   Argentina Donovan, PA-C  aspirin EC 81 MG EC tablet Take 1 tablet (81 mg total) by mouth daily. 11/05/15   Florencia Reasons, MD  Cariprazine HCl (VRAYLAR) 4.5 MG CAPS Take 1 capsule (4.5 mg total) by mouth at bedtime. 08/09/16   Mariel Aloe, MD  carvedilol (COREG) 6.25 MG tablet Take 1 tablet (6.25 mg total) by mouth 2 (two) times daily with a meal. 09/17/16   Kielan Dreisbach, Dionne Bucy, PA-C  cloNIDine (CATAPRES) 0.3 MG tablet Take 1 tablet (0.3 mg total) by mouth 3 (three) times daily. 09/17/16   Argentina Donovan, PA-C  docusate sodium (COLACE) 100 MG capsule Take 1 capsule (100 mg total) by mouth 2 (two) times daily. 09/10/16   Robbie Lis, MD  famotidine (PEPCID) 20 MG  tablet Take 1 tablet (20 mg total) by mouth daily. 09/17/16   Argentina Donovan, PA-C  hydrALAZINE (APRESOLINE) 100 MG tablet Take 1 tablet (100 mg total) by mouth every 8 (eight) hours. 09/17/16   Argentina Donovan, PA-C  Multiple Vitamin (MULTIVITAMIN WITH MINERALS) TABS tablet Take 1 tablet by mouth daily. 09/11/16   Robbie Lis, MD  nitroGLYCERIN (NITROSTAT) 0.4 MG SL tablet Place 1 tablet (0.4 mg total) under the tongue every 5 (five) minutes as needed for chest pain. 11/05/15   Florencia Reasons, MD  ondansetron (ZOFRAN) 4 MG tablet Take 1 tablet (4 mg total) by mouth every 6 (six) hours as needed for nausea. 09/17/16   Argentina Donovan, PA-C  oxyCODONE (OXY IR/ROXICODONE) 5 MG immediate release tablet Take 1-2 tablets (5-10 mg total) by mouth every 4 (four) hours as needed  for severe pain. 09/10/16   Robbie Lis, MD  PARoxetine (PAXIL) 20 MG tablet Take 1 tablet (20 mg total) by mouth daily. 08/09/16   Mariel Aloe, MD  traMADol (ULTRAM) 50 MG tablet Take 1 tablet (50 mg total) by mouth every 6 (six) hours as needed. 09/17/16   Argentina Donovan, PA-C     Objective:  EXAM:   Vitals:   09/17/16 1132  BP: (!) 191/139  Pulse: 70  Resp: 18  Temp: 98.7 F (37.1 C)  TempSrc: Oral  SpO2: 100%  Weight: (!) 303 lb (137.4 kg)  Height: 5\' 7"  (1.702 m)    General appearance : A&OX3. NAD. Non-toxic-appearing. But does appear uncomfortable.  In wheel chair.  Daughter present but sleeping entire visit.   HEENT: Atraumatic and Normocephalic.  PERRLA. EOM intact.   Neck: supple, no JVD. No cervical lymphadenopathy. No thyromegaly Chest/Lungs:  Breathing-non-labored, Good air entry bilaterally, breath sounds normal without rales, rhonchi, or wheezing  CVS: S1 S2 regular, no murmurs, gallops, rubs  R leg-dressing soaked though.  Removed.  Actual incision site from R leg appears to be well-approximated .  No active bleeding.  New telfa and guaze applied along with ACE wraps and coban to secure.   Neurology:  CN II-XII grossly intact, Non focal.   Psych:  TP linear. J/I WNL. Normal speech. Appropriate eye contact and affect.  Skin:  No Rash  Data Review Lab Results  Component Value Date   HGBA1C 5.9 (H) 11/04/2015   HGBA1C 6.1 (H) 06/06/2013     Assessment & Plan   1. Necrotizing fasciitis ([place]) Resolved in hospital and with aka.   - CBC with Differential/Platelet  2. Hypertension, unspecified type Actually very high today but asymptomatic.  She needs to pick up all prescriptions today and get started on them.  Our pharmacy will assist with that.   - CBC with Differential/Platelet - Basic metabolic panel - hydrALAZINE (APRESOLINE) 100 MG tablet; Take 1 tablet (100 mg total) by mouth every 8 (eight) hours.  Dispense: 90 tablet; Refill: 0 - cloNIDine  (CATAPRES) 0.3 MG tablet; Take 1 tablet (0.3 mg total) by mouth 3 (three) times daily.  Dispense: 90 tablet; Refill: 0 - carvedilol (COREG) 6.25 MG tablet; Take 1 tablet (6.25 mg total) by mouth 2 (two) times daily with a meal.  Dispense: 60 tablet; Refill: 0  3. S/P AKA (above knee amputation) unilateral, right ([place]) We called ortho and had her worked in today at [facility] [place] to be ordered and arranged for wound care if needed.  New dressing applied in our office.  Also to follow  for pain management needs s/p surgery.   - CBC with Differential/Platelet - Amino Acids-Protein Hydrolys (FEEDING SUPPLEMENT, PRO-STAT SUGAR FREE 64,) LIQD; Take 30 mLs by mouth 2 (two) times daily.  Dispense: 900 mL; Refill: 0 - traMADol (ULTRAM) 50 MG tablet; Take 1 tablet (50 mg total) by mouth every 6 (six) hours as needed.  Dispense: 15 tablet; Refill: 0 - ketorolac (TORADOL) injection 60 mg; Inject 2 mLs (60 mg total) into the muscle once given in office  4. Anemia, unspecified type s/p aka - CBC with Differential/Platelet  5. AKI (acute kidney injury) ([place]) - Basic metabolic panel  6. Hypertensive urgency - cloNIDine (CATAPRES) tablet 0.3 mg; Take 1.5 tablets (0.3 mg total) by mouth once. In office To ED/call 911 if becomes symptomatic. BP recheck after clonidine in office-162/90  Patient have been counseled extensively about nutrition and exercise  rtc 1 week to establish care.  Will also have her f/up and gave her [facility] card.   The patient was given clear instructions to go to ER or return to medical center if symptoms don't improve, worsen or new problems develop. The patient verbalized understanding. The patient was told to call to get lab results if they haven't heard anything in the next week.     Freeman Caldron, PA-C [facility], [place]   09/17/2016, 11:53 AM

## 2016-09-17 ENCOUNTER — Ambulatory Visit (INDEPENDENT_AMBULATORY_CARE_PROVIDER_SITE_OTHER): Payer: Self-pay | Admitting: Orthopedic Surgery

## 2016-09-17 ENCOUNTER — Ambulatory Visit: Payer: Medicaid Other | Attending: Internal Medicine | Admitting: Physician Assistant

## 2016-09-17 VITALS — BP 162/90 | HR 70 | Temp 98.7°F | Resp 18 | Ht 67.0 in | Wt 303.0 lb

## 2016-09-17 DIAGNOSIS — Z79899 Other long term (current) drug therapy: Secondary | ICD-10-CM | POA: Diagnosis not present

## 2016-09-17 DIAGNOSIS — Z89611 Acquired absence of right leg above knee: Secondary | ICD-10-CM | POA: Diagnosis not present

## 2016-09-17 DIAGNOSIS — Z79891 Long term (current) use of opiate analgesic: Secondary | ICD-10-CM | POA: Insufficient documentation

## 2016-09-17 DIAGNOSIS — I10 Essential (primary) hypertension: Secondary | ICD-10-CM | POA: Diagnosis present

## 2016-09-17 DIAGNOSIS — Z789 Other specified health status: Secondary | ICD-10-CM

## 2016-09-17 DIAGNOSIS — Z7409 Other reduced mobility: Secondary | ICD-10-CM

## 2016-09-17 DIAGNOSIS — I160 Hypertensive urgency: Secondary | ICD-10-CM | POA: Diagnosis not present

## 2016-09-17 DIAGNOSIS — M726 Necrotizing fasciitis: Secondary | ICD-10-CM

## 2016-09-17 DIAGNOSIS — Z7982 Long term (current) use of aspirin: Secondary | ICD-10-CM | POA: Insufficient documentation

## 2016-09-17 DIAGNOSIS — N179 Acute kidney failure, unspecified: Secondary | ICD-10-CM | POA: Diagnosis not present

## 2016-09-17 DIAGNOSIS — D649 Anemia, unspecified: Secondary | ICD-10-CM | POA: Insufficient documentation

## 2016-09-17 MED ORDER — CLONIDINE HCL 0.3 MG PO TABS: 0.3000 mg | ORAL_TABLET | Freq: Three times a day (TID) | ORAL | 0 refills | Status: DC

## 2016-09-17 MED ORDER — OXYCODONE HCL 5 MG PO TABS: 5.0000 mg | ORAL_TABLET | ORAL | 0 refills | Status: DC | PRN

## 2016-09-17 MED ORDER — KETOROLAC TROMETHAMINE 60 MG/2ML IM SOLN
60.0000 mg | Freq: Once | INTRAMUSCULAR | Status: AC
  Administered 2016-09-17: 60 mg via INTRAMUSCULAR

## 2016-09-17 MED ORDER — CARVEDILOL 6.25 MG PO TABS: 6.2500 mg | ORAL_TABLET | Freq: Two times a day (BID) | ORAL | 0 refills | Status: DC

## 2016-09-17 MED ORDER — PRO-STAT SUGAR FREE PO LIQD: 30.0000 mL | Freq: Two times a day (BID) | ORAL | 0 refills | Status: DC

## 2016-09-17 MED ORDER — CLONIDINE HCL 0.2 MG PO TABS
0.3000 mg | ORAL_TABLET | Freq: Once | ORAL | Status: AC
  Administered 2016-09-17: 0.3 mg via ORAL

## 2016-09-17 MED ORDER — HYDRALAZINE HCL 100 MG PO TABS: 100.0000 mg | ORAL_TABLET | Freq: Three times a day (TID) | ORAL | 0 refills | Status: DC

## 2016-09-17 MED ORDER — FAMOTIDINE 20 MG PO TABS: 20.0000 mg | ORAL_TABLET | Freq: Every day | ORAL | 0 refills | Status: DC

## 2016-09-17 MED ORDER — TRAMADOL HCL 50 MG PO TABS: 50.0000 mg | ORAL_TABLET | Freq: Four times a day (QID) | ORAL | 0 refills | Status: DC | PRN

## 2016-09-17 MED ORDER — ONDANSETRON HCL 4 MG PO TABS: 4.0000 mg | ORAL_TABLET | Freq: Four times a day (QID) | ORAL | 0 refills | Status: DC | PRN

## 2016-09-17 NOTE — Patient Instructions (Signed)
Check BP daily and record   Hypertension Hypertension, commonly called high blood pressure, is when the force of blood pumping through the arteries is too strong. The arteries are the blood vessels that carry blood from the heart throughout the body. Hypertension forces the heart to work harder to pump blood and may cause arteries to become narrow or stiff. Having untreated or uncontrolled hypertension can cause heart attacks, strokes, kidney disease, and other problems. A blood pressure reading consists of a higher number over a lower number. Ideally, your blood pressure should be below 120/80. The first ("top") number is called the systolic pressure. It is a measure of the pressure in your arteries as your heart beats. The second ("bottom") number is called the diastolic pressure. It is a measure of the pressure in your arteries as the heart relaxes. What are the causes? The cause of this condition is not known. What increases the risk? Some risk factors for high blood pressure are under your control. Others are not. Factors you can change  Smoking.  Having type 2 diabetes mellitus, high cholesterol, or both.  Not getting enough exercise or physical activity.  Being overweight.  Having too much fat, sugar, calories, or salt (sodium) in your diet.  Drinking too much alcohol. Factors that are difficult or impossible to change  Having chronic kidney disease.  Having a family history of high blood pressure.  Age. Risk increases with age.  Race. You may be at higher risk if you are African-American.  Gender. Men are at higher risk than women before age 14. After age 11, women are at higher risk than men.  Having obstructive sleep apnea.  Stress. What are the signs or symptoms? Extremely high blood pressure (hypertensive crisis) may cause:  Headache.  Anxiety.  Shortness of breath.  Nosebleed.  Nausea and vomiting.  Severe chest pain.  Jerky movements you cannot  control (seizures).  How is this diagnosed? This condition is diagnosed by measuring your blood pressure while you are seated, with your arm resting on a surface. The cuff of the blood pressure monitor will be placed directly against the skin of your upper arm at the level of your heart. It should be measured at least twice using the same arm. Certain conditions can cause a difference in blood pressure between your right and left arms. Certain factors can cause blood pressure readings to be lower or higher than normal (elevated) for a short period of time:  When your blood pressure is higher when you are in a health care provider's office than when you are at home, this is called white coat hypertension. Most people with this condition do not need medicines.  When your blood pressure is higher at home than when you are in a health care provider's office, this is called masked hypertension. Most people with this condition may need medicines to control blood pressure.  If you have a high blood pressure reading during one visit or you have normal blood pressure with other risk factors:  You may be asked to return on a different day to have your blood pressure checked again.  You may be asked to monitor your blood pressure at home for 1 week or longer.  If you are diagnosed with hypertension, you may have other blood or imaging tests to help your health care provider understand your overall risk for other conditions. How is this treated? This condition is treated by making healthy lifestyle changes, such as eating healthy foods, exercising  more, and reducing your alcohol intake. Your health care provider may prescribe medicine if lifestyle changes are not enough to get your blood pressure under control, and if:  Your systolic blood pressure is above 130.  Your diastolic blood pressure is above 80.  Your personal target blood pressure may vary depending on your medical conditions, your age, and  other factors. Follow these instructions at home: Eating and drinking  Eat a diet that is high in fiber and potassium, and low in sodium, added sugar, and fat. An example eating plan is called the DASH (Dietary Approaches to Stop Hypertension) diet. To eat this way: ? Eat plenty of fresh fruits and vegetables. Try to fill half of your plate at each meal with fruits and vegetables. ? Eat whole grains, such as whole wheat pasta, brown rice, or whole grain bread. Fill about one quarter of your plate with whole grains. ? Eat or drink low-fat dairy products, such as skim milk or low-fat yogurt. ? Avoid fatty cuts of meat, processed or cured meats, and poultry with skin. Fill about one quarter of your plate with lean proteins, such as fish, chicken without skin, beans, eggs, and tofu. ? Avoid premade and processed foods. These tend to be higher in sodium, added sugar, and fat.  Reduce your daily sodium intake. Most people with hypertension should eat less than 1,500 mg of sodium a day.  Limit alcohol intake to no more than 1 drink a day for nonpregnant women and 2 drinks a day for men. One drink equals 12 oz of beer, 5 oz of wine, or 1 oz of hard liquor. Lifestyle  Work with your health care provider to maintain a healthy body weight or to lose weight. Ask what an ideal weight is for you.  Get at least 30 minutes of exercise that causes your heart to beat faster (aerobic exercise) most days of the week. Activities may include walking, swimming, or biking.  Include exercise to strengthen your muscles (resistance exercise), such as pilates or lifting weights, as part of your weekly exercise routine. Try to do these types of exercises for 30 minutes at least 3 days a week.  Do not use any products that contain nicotine or tobacco, such as cigarettes and e-cigarettes. If you need help quitting, ask your health care provider.  Monitor your blood pressure at home as told by your health care  provider.  Keep all follow-up visits as told by your health care provider. This is important. Medicines  Take over-the-counter and prescription medicines only as told by your health care provider. Follow directions carefully. Blood pressure medicines must be taken as prescribed.  Do not skip doses of blood pressure medicine. Doing this puts you at risk for problems and can make the medicine less effective.  Ask your health care provider about side effects or reactions to medicines that you should watch for. Contact a health care provider if:  You think you are having a reaction to a medicine you are taking.  You have headaches that keep coming back (recurring).  You feel dizzy.  You have swelling in your ankles.  You have trouble with your vision. Get help right away if:  You develop a severe headache or confusion.  You have unusual weakness or numbness.  You feel faint.  You have severe pain in your chest or abdomen.  You vomit repeatedly.  You have trouble breathing. Summary  Hypertension is when the force of blood pumping through your arteries is  too strong. If this condition is not controlled, it may put you at risk for serious complications.  Your personal target blood pressure may vary depending on your medical conditions, your age, and other factors. For most people, a normal blood pressure is less than 120/80.  Hypertension is treated with lifestyle changes, medicines, or a combination of both. Lifestyle changes include weight loss, eating a healthy, low-sodium diet, exercising more, and limiting alcohol. This information is not intended to replace advice given to you by your health care provider. Make sure you discuss any questions you have with your health care provider. Document Released: 02/09/2005 Document Revised: 01/08/2016 Document Reviewed: 01/08/2016 Elsevier Interactive Patient Education  [employer].

## 2016-09-17 NOTE — Progress Notes (Signed)
Post-Op Visit Note   Patient: Kelly Novak           Date of Birth: December 25, 1970           MRN: [id number] Visit Date: 09/17/2016 PCP: Patient, No Pcp Per  Chief Complaint: No chief complaint on file.   HPI:  Patient is a 46 year old woman seen 2 weeks status post right above knee amputation.  Is concerned about her incision and current wound care. Deferred rehab placement during admission. Would like assistance with rehab placement or home health nursing to help with dressing changes.     Ortho Exam Incision is well approximated with staples and sutures. No gaping, drainage, erythema or sign of infection.  Visit Diagnoses:  1. S/P AKA (above knee amputation) unilateral, right ([place])     Plan: continue daily wound cleansing. Apply dry dressings. Follow up in 1 week for suture removal. Will call for home health assistance with incisional checks, will also have [facility] SW assess for nursing home/rehab placement.   Follow-Up Instructions: Return in about 1 week (around 09/24/2016) for suture and staple removal.   Imaging: No results found.  Orders:  No orders of the defined types were placed in this encounter.  No orders of the defined types were placed in this encounter.    PMFS History: Patient Active Problem List   Diagnosis Date Noted  . S/P AKA (above knee amputation) unilateral, right ([place]) 09/17/2016  . Injury of right leg   . Crush injury lower leg, right, sequela   . Post-traumatic wound infection   . Cellulitis 08/31/2016  . Necrotizing fasciitis ([place]) 08/31/2016  . Bipolar disorder ([place]) 08/31/2016  . History of stroke 08/07/2016  . Polysubstance abuse   . Chronic systolic (congestive) heart failure ([place])   . Depression 11/03/2015  . GERD (gastroesophageal reflux disease) 11/03/2015  . Hypokalemia 11/03/2015  . Abdominal pain 11/03/2015  . Chronic back pain   . Abnormal uterine bleeding 08/10/2013  . Uterine fibroids 08/10/2013  . ASCUS pap with negative  HRHPV 08/10/2013  . Hypertensive emergency 06/06/2013  . Chest pain 06/06/2013  . Hypertensive urgency 06/06/2013  . Pes planus (flat feet) 09/02/2012  . Anemia 09/02/2012  . Foot pain 09/02/2012  . Smoker 09/02/2012   Past Medical History:  Diagnosis Date  . Anemia   . Anxiety   . Arthritis   . Bipolar 1 disorder ([place])   . Blood dyscrasia    sickle cell trait  . Chronic back pain   . Chronic systolic (congestive) heart failure ([place])   . GERD (gastroesophageal reflux disease)   . Hypertension   . Obesity   . Polysubstance abuse   . Shortness of breath    on exertion due to weight  . Stroke [facility])     Family History  Problem Relation Age of Onset  . CAD Father   . Heart failure Brother   . Hypertension Brother   . CAD Paternal Aunt     Past Surgical History:  Procedure Laterality Date  . AMPUTATION Right 09/04/2016   Procedure: Right Above Knee Amputation;  Surgeon: Newt Minion, MD;  Location: [facility];  Service: Orthopedics;  Laterality: Right;  . CESAREAN SECTION    . CHOLECYSTECTOMY    . HYSTEROSCOPY N/A 08/23/2013   Procedure: HYSTEROSCOPY D&C WITH HYDROTHERMAL ABLATION;  Surgeon: Osborne Oman, MD;  Location: [facility];  Service: Gynecology;  Laterality: N/A;  . I&D EXTREMITY Right 09/02/2016   Procedure: IRRIGATION AND  DEBRIDEMENT EXTREMITY;  Surgeon: Newt Minion, MD;  Location: [facility];  Service: Orthopedics;  Laterality: Right;  . TUBAL LIGATION     Social History   Occupational History  . Not on file.   Social History Main Topics  . Smoking status: Former Smoker    Packs/day: 0.00    Years: 14.00    Types: Cigarettes  . Smokeless tobacco: Never Used  . Alcohol use No  . Drug use: Yes    Types: Cocaine     Comment: last week   . Sexual activity: No

## 2016-09-24 ENCOUNTER — Encounter (INDEPENDENT_AMBULATORY_CARE_PROVIDER_SITE_OTHER): Payer: Self-pay | Admitting: Family

## 2016-09-24 ENCOUNTER — Ambulatory Visit (INDEPENDENT_AMBULATORY_CARE_PROVIDER_SITE_OTHER): Payer: Self-pay | Admitting: Family

## 2016-09-24 VITALS — Ht 67.0 in | Wt 303.0 lb

## 2016-09-24 DIAGNOSIS — Z89611 Acquired absence of right leg above knee: Secondary | ICD-10-CM

## 2016-09-24 MED ORDER — SILVER SULFADIAZINE 1 % EX CREA: 1.0000 "application " | TOPICAL_CREAM | Freq: Every day | CUTANEOUS | 0 refills | Status: DC

## 2016-09-24 NOTE — Addendum Note (Signed)
Addended by: Suzan Slick on: 09/24/2016 03:33 PM   Modules accepted: Orders

## 2016-09-24 NOTE — Progress Notes (Signed)
Post-Op Visit Note   Patient: Kelly Novak           Date of Birth: 1970/08/01           MRN: [id number] Visit Date: 09/24/2016 PCP: Patient, No Pcp Per  Chief Complaint:  Chief Complaint  Patient presents with  . Right Leg - Routine Post Op    09/04/16 right AKA    HPI:  The patient is a 46 year old woman who is about 3 weeks status post right above-the-knee amputation.    Ortho Exam Incision well proximal him sutures and staples is healing well there is no drainage or gaping no erythema no cellulitis.  Visit Diagnoses:  1. S/P AKA (above knee amputation) unilateral, right ([place])     Plan: We will harvest sutures and staples today continue dry dressing changes daily until healed. Provided prescription for prosthesis she'll follow-up in the office in weeks for incision check.  Follow-Up Instructions: Return in about 2 weeks (around 10/08/2016).   Imaging: No results found.  Orders:  No orders of the defined types were placed in this encounter.  No orders of the defined types were placed in this encounter.    PMFS History: Patient Active Problem List   Diagnosis Date Noted  . S/P AKA (above knee amputation) unilateral, right ([place]) 09/17/2016  . Injury of right leg   . Crush injury lower leg, right, sequela   . Post-traumatic wound infection   . Cellulitis 08/31/2016  . Necrotizing fasciitis ([place]) 08/31/2016  . Bipolar disorder ([place]) 08/31/2016  . History of stroke 08/07/2016  . Polysubstance abuse   . Chronic systolic (congestive) heart failure ([place])   . Depression 11/03/2015  . GERD (gastroesophageal reflux disease) 11/03/2015  . Hypokalemia 11/03/2015  . Abdominal pain 11/03/2015  . Chronic back pain   . Abnormal uterine bleeding 08/10/2013  . Uterine fibroids 08/10/2013  . ASCUS pap with negative HRHPV 08/10/2013  . Hypertensive emergency 06/06/2013  . Chest pain 06/06/2013  . Hypertensive urgency 06/06/2013  . Pes planus (flat feet) 09/02/2012  .  Anemia 09/02/2012  . Foot pain 09/02/2012  . Smoker 09/02/2012   Past Medical History:  Diagnosis Date  . Anemia   . Anxiety   . Arthritis   . Bipolar 1 disorder ([place])   . Blood dyscrasia    sickle cell trait  . Chronic back pain   . Chronic systolic (congestive) heart failure ([place])   . GERD (gastroesophageal reflux disease)   . Hypertension   . Obesity   . Polysubstance abuse   . Shortness of breath    on exertion due to weight  . Stroke [facility])     Family History  Problem Relation Age of Onset  . CAD Father   . Heart failure Brother   . Hypertension Brother   . CAD Paternal Aunt     Past Surgical History:  Procedure Laterality Date  . AMPUTATION Right 09/04/2016   Procedure: Right Above Knee Amputation;  Surgeon: Newt Minion, MD;  Location: [facility];  Service: Orthopedics;  Laterality: Right;  . CESAREAN SECTION    . CHOLECYSTECTOMY    . HYSTEROSCOPY N/A 08/23/2013   Procedure: HYSTEROSCOPY D&C WITH HYDROTHERMAL ABLATION;  Surgeon: Osborne Oman, MD;  Location: [facility];  Service: Gynecology;  Laterality: N/A;  . I&D EXTREMITY Right 09/02/2016   Procedure: IRRIGATION AND DEBRIDEMENT EXTREMITY;  Surgeon: Newt Minion, MD;  Location: [facility];  Service: Orthopedics;  Laterality: Right;  . TUBAL LIGATION  Social History   Occupational History  . Not on file.   Social History Main Topics  . Smoking status: Former Smoker    Packs/day: 0.00    Years: 14.00    Types: Cigarettes  . Smokeless tobacco: Never Used  . Alcohol use No  . Drug use: Yes    Types: Cocaine     Comment: last week   . Sexual activity: No

## 2016-09-28 ENCOUNTER — Ambulatory Visit: Payer: Self-pay | Admitting: Internal Medicine

## 2016-09-29 ENCOUNTER — Other Ambulatory Visit: Payer: Self-pay | Admitting: Physician Assistant

## 2016-09-29 ENCOUNTER — Telehealth: Payer: Self-pay | Admitting: General Practice

## 2016-09-29 DIAGNOSIS — I10 Essential (primary) hypertension: Secondary | ICD-10-CM

## 2016-09-29 NOTE — Telephone Encounter (Signed)
Pt called to ask if possible that the provider she will see on 10/05/16 can auth a refill for her  cloNIDine (CATAPRES) 0.3 MG tablet   Please let her know if that pcp will approve the refill or not, follow up

## 2016-09-29 NOTE — Telephone Encounter (Signed)
Will forward to provider  

## 2016-09-30 ENCOUNTER — Other Ambulatory Visit: Payer: Self-pay | Admitting: Physician Assistant

## 2016-09-30 DIAGNOSIS — I10 Essential (primary) hypertension: Secondary | ICD-10-CM

## 2016-10-01 ENCOUNTER — Other Ambulatory Visit: Payer: Self-pay | Admitting: General Practice

## 2016-10-01 ENCOUNTER — Other Ambulatory Visit: Payer: Self-pay | Admitting: Physician Assistant

## 2016-10-01 ENCOUNTER — Telehealth (INDEPENDENT_AMBULATORY_CARE_PROVIDER_SITE_OTHER): Payer: Self-pay | Admitting: Orthopedic Surgery

## 2016-10-01 ENCOUNTER — Other Ambulatory Visit (INDEPENDENT_AMBULATORY_CARE_PROVIDER_SITE_OTHER): Payer: Self-pay | Admitting: Orthopedic Surgery

## 2016-10-01 DIAGNOSIS — I10 Essential (primary) hypertension: Secondary | ICD-10-CM

## 2016-10-01 MED ORDER — OXYCODONE-ACETAMINOPHEN 5-325 MG PO TABS: 1.0000 | ORAL_TABLET | Freq: Two times a day (BID) | ORAL | 0 refills | Status: DC | PRN

## 2016-10-01 MED ORDER — CLONIDINE HCL 0.3 MG PO TABS: 0.3000 mg | ORAL_TABLET | Freq: Three times a day (TID) | ORAL | 0 refills | Status: DC

## 2016-10-01 NOTE — Telephone Encounter (Signed)
I sent the prescription.  Thanks, Freeman Caldron, PA-C

## 2016-10-01 NOTE — Telephone Encounter (Signed)
Called and lm on vm to advise that rx is at the front desk for pick up.  

## 2016-10-01 NOTE — Telephone Encounter (Signed)
Patient called asking for a refill on oxycodone. CB # [phone]

## 2016-10-01 NOTE — Telephone Encounter (Signed)
Pt is one month s/p an AKA requesting a refill on percocet last refill was 09/17/16 #42

## 2016-10-01 NOTE — Telephone Encounter (Signed)
Patient called requesting refills on cloNIDine (CATAPRES) 0.3 MG    Please call patient when rx ready for pick up

## 2016-10-01 NOTE — Telephone Encounter (Signed)
rx written

## 2016-10-02 MED ORDER — CLONIDINE HCL 0.3 MG PO TABS: 0.3000 mg | ORAL_TABLET | Freq: Three times a day (TID) | ORAL | 0 refills | Status: DC

## 2016-10-02 NOTE — Telephone Encounter (Signed)
Patient called stating she is completely out of medication cloNIDine (CATAPRES) 0.3 MG tablet  , pt uses our pharmacy [facility] and followed up with [place] where rx was sent to  but they have no record of rx.   Pt would like rx sent to [facility] if possible

## 2016-10-02 NOTE — Telephone Encounter (Signed)
Pt picked up medicine yesterday

## 2016-10-02 NOTE — Telephone Encounter (Signed)
Patient called stated Pharmacy told her there is no Rx for Clonidine called in.  Please call her back at [phone],

## 2016-10-02 NOTE — Telephone Encounter (Signed)
Clonidine sent to [facility]

## 2016-10-05 ENCOUNTER — Ambulatory Visit: Payer: Self-pay | Admitting: Internal Medicine

## 2016-10-08 ENCOUNTER — Encounter (INDEPENDENT_AMBULATORY_CARE_PROVIDER_SITE_OTHER): Payer: Self-pay | Admitting: Family

## 2016-10-08 ENCOUNTER — Ambulatory Visit (INDEPENDENT_AMBULATORY_CARE_PROVIDER_SITE_OTHER): Payer: Self-pay | Admitting: Family

## 2016-10-08 DIAGNOSIS — Z89611 Acquired absence of right leg above knee: Secondary | ICD-10-CM

## 2016-10-08 MED ORDER — OXYCODONE-ACETAMINOPHEN 5-325 MG PO TABS: 1.0000 | ORAL_TABLET | Freq: Two times a day (BID) | ORAL | 0 refills | Status: DC | PRN

## 2016-10-08 NOTE — Progress Notes (Signed)
Post-Op Visit Note   Patient: Kelly Novak           Date of Birth: 09-Aug-1970           MRN: [id number] Visit Date: 10/08/2016 PCP: Patient, No Pcp Per  Chief Complaint:  Chief Complaint  Patient presents with  . Right Knee - Routine Post Op, Follow-up    HPI:  The patient is a 46 year old woman who is seen today about a month out from a right above-the-knee amputation. Does have a few staples that will need to be harvested today. Overall she is without concern is going to be casted for her prosthetic next week.    Ortho Exam On examination the right above-the-knee amputation is well healed is consolidating well there is no drainage or erythema.  Visit Diagnoses:  1. S/P AKA (above knee amputation) unilateral, right (HCC)     Plan: Harvest remaining staples continue wearing her shrinker daily. Follow-up in office in 2 months.  Follow-Up Instructions: No Follow-up on file.   Imaging: No results found.  Orders:  No orders of the defined types were placed in this encounter.  Meds ordered this encounter  Medications  . oxyCODONE-acetaminophen (PERCOCET/ROXICET) 5-325 MG tablet    Sig: Take 1 tablet by mouth 2 (two) times daily as needed for severe pain.    Dispense:  20 tablet    Refill:  0    Do not fill until, 10/11/16     PMFS History: Patient Active Problem List   Diagnosis Date Noted  . S/P AKA (above knee amputation) unilateral, right ([place]) 09/17/2016  . Bipolar disorder ([place]) 08/31/2016  . History of stroke 08/07/2016  . Polysubstance abuse   . Chronic systolic (congestive) heart failure ([place])   . Depression 11/03/2015  . GERD (gastroesophageal reflux disease) 11/03/2015  . Chronic back pain   . Uterine fibroids 08/10/2013  . ASCUS pap with negative HRHPV 08/10/2013  . Pes planus (flat feet) 09/02/2012  . Anemia 09/02/2012  . Smoker 09/02/2012   Past Medical History:  Diagnosis Date  . Anemia   . Anxiety   . Arthritis   . Bipolar 1 disorder  ([place])   . Blood dyscrasia    sickle cell trait  . Chronic back pain   . Chronic systolic (congestive) heart failure ([place])   . GERD (gastroesophageal reflux disease)   . Hypertension   . Obesity   . Polysubstance abuse   . Shortness of breath    on exertion due to weight  . Stroke [facility])     Family History  Problem Relation Age of Onset  . CAD Father   . Heart failure Brother   . Hypertension Brother   . CAD Paternal Aunt     Past Surgical History:  Procedure Laterality Date  . AMPUTATION Right 09/04/2016   Procedure: Right Above Knee Amputation;  Surgeon: Newt Minion, MD;  Location: [facility];  Service: Orthopedics;  Laterality: Right;  . CESAREAN SECTION    . CHOLECYSTECTOMY    . HYSTEROSCOPY N/A 08/23/2013   Procedure: HYSTEROSCOPY D&C WITH HYDROTHERMAL ABLATION;  Surgeon: Osborne Oman, MD;  Location: [facility];  Service: Gynecology;  Laterality: N/A;  . I&D EXTREMITY Right 09/02/2016   Procedure: IRRIGATION AND DEBRIDEMENT EXTREMITY;  Surgeon: Newt Minion, MD;  Location: [facility];  Service: Orthopedics;  Laterality: Right;  . TUBAL LIGATION     Social History   Occupational History  . Not on file.   Social History  Main Topics  . Smoking status: Former Smoker    Packs/day: 0.00    Years: 14.00    Types: Cigarettes  . Smokeless tobacco: Never Used  . Alcohol use No  . Drug use: Yes    Types: Cocaine     Comment: last week   . Sexual activity: No

## 2016-10-13 ENCOUNTER — Encounter (HOSPITAL_COMMUNITY): Payer: Self-pay

## 2016-10-13 ENCOUNTER — Emergency Department (HOSPITAL_COMMUNITY): Payer: Medicare Other

## 2016-10-13 DIAGNOSIS — Z8249 Family history of ischemic heart disease and other diseases of the circulatory system: Secondary | ICD-10-CM

## 2016-10-13 DIAGNOSIS — Z8673 Personal history of transient ischemic attack (TIA), and cerebral infarction without residual deficits: Secondary | ICD-10-CM

## 2016-10-13 DIAGNOSIS — I160 Hypertensive urgency: Principal | ICD-10-CM | POA: Diagnosis present

## 2016-10-13 DIAGNOSIS — I110 Hypertensive heart disease with heart failure: Secondary | ICD-10-CM | POA: Diagnosis present

## 2016-10-13 DIAGNOSIS — R8271 Bacteriuria: Secondary | ICD-10-CM | POA: Diagnosis present

## 2016-10-13 DIAGNOSIS — E876 Hypokalemia: Secondary | ICD-10-CM | POA: Diagnosis present

## 2016-10-13 DIAGNOSIS — M79651 Pain in right thigh: Secondary | ICD-10-CM | POA: Diagnosis present

## 2016-10-13 DIAGNOSIS — H269 Unspecified cataract: Secondary | ICD-10-CM | POA: Diagnosis present

## 2016-10-13 DIAGNOSIS — T465X6A Underdosing of other antihypertensive drugs, initial encounter: Secondary | ICD-10-CM | POA: Diagnosis present

## 2016-10-13 DIAGNOSIS — Z91048 Other nonmedicinal substance allergy status: Secondary | ICD-10-CM

## 2016-10-13 DIAGNOSIS — I5042 Chronic combined systolic (congestive) and diastolic (congestive) heart failure: Secondary | ICD-10-CM | POA: Diagnosis present

## 2016-10-13 DIAGNOSIS — D649 Anemia, unspecified: Secondary | ICD-10-CM | POA: Diagnosis present

## 2016-10-13 DIAGNOSIS — Z89611 Acquired absence of right leg above knee: Secondary | ICD-10-CM

## 2016-10-13 DIAGNOSIS — R809 Proteinuria, unspecified: Secondary | ICD-10-CM | POA: Diagnosis present

## 2016-10-13 DIAGNOSIS — R079 Chest pain, unspecified: Secondary | ICD-10-CM | POA: Diagnosis not present

## 2016-10-13 DIAGNOSIS — R000 Tachycardia, unspecified: Secondary | ICD-10-CM | POA: Diagnosis present

## 2016-10-13 DIAGNOSIS — Z6841 Body Mass Index (BMI) 40.0 and over, adult: Secondary | ICD-10-CM

## 2016-10-13 DIAGNOSIS — F419 Anxiety disorder, unspecified: Secondary | ICD-10-CM | POA: Diagnosis present

## 2016-10-13 DIAGNOSIS — Z79899 Other long term (current) drug therapy: Secondary | ICD-10-CM

## 2016-10-13 DIAGNOSIS — F1210 Cannabis abuse, uncomplicated: Secondary | ICD-10-CM | POA: Diagnosis present

## 2016-10-13 DIAGNOSIS — F1410 Cocaine abuse, uncomplicated: Secondary | ICD-10-CM | POA: Diagnosis present

## 2016-10-13 DIAGNOSIS — F329 Major depressive disorder, single episode, unspecified: Secondary | ICD-10-CM | POA: Diagnosis present

## 2016-10-13 DIAGNOSIS — K219 Gastro-esophageal reflux disease without esophagitis: Secondary | ICD-10-CM | POA: Diagnosis present

## 2016-10-13 DIAGNOSIS — Z91120 Patient's intentional underdosing of medication regimen due to financial hardship: Secondary | ICD-10-CM

## 2016-10-13 DIAGNOSIS — F17210 Nicotine dependence, cigarettes, uncomplicated: Secondary | ICD-10-CM | POA: Diagnosis present

## 2016-10-13 NOTE — ED Triage Notes (Signed)
Pt endorses hypertension and chest pain that began today. Non radiating. Pt ran out of her BP meds 2 days ago and cannot get a refill until next month. Hypertensive in triage 212/148. Axox4.

## 2016-10-14 ENCOUNTER — Other Ambulatory Visit (HOSPITAL_COMMUNITY): Payer: Self-pay

## 2016-10-14 ENCOUNTER — Observation Stay (HOSPITAL_COMMUNITY): Payer: Medicare Other

## 2016-10-14 ENCOUNTER — Other Ambulatory Visit: Payer: Self-pay

## 2016-10-14 ENCOUNTER — Inpatient Hospital Stay (HOSPITAL_COMMUNITY)
Admission: EM | Admit: 2016-10-14 | Discharge: 2016-10-18 | DRG: 305 | Disposition: A | Discharge status: Home / Self Care | Payer: Medicare Other | Attending: Internal Medicine | Admitting: Internal Medicine

## 2016-10-14 DIAGNOSIS — M79651 Pain in right thigh: Secondary | ICD-10-CM | POA: Diagnosis present

## 2016-10-14 DIAGNOSIS — Z8249 Family history of ischemic heart disease and other diseases of the circulatory system: Secondary | ICD-10-CM | POA: Diagnosis not present

## 2016-10-14 DIAGNOSIS — F329 Major depressive disorder, single episode, unspecified: Secondary | ICD-10-CM | POA: Diagnosis not present

## 2016-10-14 DIAGNOSIS — Z89611 Acquired absence of right leg above knee: Secondary | ICD-10-CM | POA: Diagnosis not present

## 2016-10-14 DIAGNOSIS — I110 Hypertensive heart disease with heart failure: Secondary | ICD-10-CM | POA: Diagnosis not present

## 2016-10-14 DIAGNOSIS — Z79899 Other long term (current) drug therapy: Secondary | ICD-10-CM | POA: Diagnosis not present

## 2016-10-14 DIAGNOSIS — R000 Tachycardia, unspecified: Secondary | ICD-10-CM | POA: Diagnosis not present

## 2016-10-14 DIAGNOSIS — Z91120 Patient's intentional underdosing of medication regimen due to financial hardship: Secondary | ICD-10-CM | POA: Diagnosis not present

## 2016-10-14 DIAGNOSIS — F1210 Cannabis abuse, uncomplicated: Secondary | ICD-10-CM | POA: Diagnosis not present

## 2016-10-14 DIAGNOSIS — I10 Essential (primary) hypertension: Secondary | ICD-10-CM

## 2016-10-14 DIAGNOSIS — I160 Hypertensive urgency: Secondary | ICD-10-CM | POA: Diagnosis not present

## 2016-10-14 DIAGNOSIS — Z8673 Personal history of transient ischemic attack (TIA), and cerebral infarction without residual deficits: Secondary | ICD-10-CM | POA: Diagnosis not present

## 2016-10-14 DIAGNOSIS — Z6841 Body Mass Index (BMI) 40.0 and over, adult: Secondary | ICD-10-CM | POA: Diagnosis not present

## 2016-10-14 DIAGNOSIS — K219 Gastro-esophageal reflux disease without esophagitis: Secondary | ICD-10-CM | POA: Diagnosis present

## 2016-10-14 DIAGNOSIS — R8271 Bacteriuria: Secondary | ICD-10-CM | POA: Diagnosis present

## 2016-10-14 DIAGNOSIS — F17210 Nicotine dependence, cigarettes, uncomplicated: Secondary | ICD-10-CM | POA: Diagnosis not present

## 2016-10-14 DIAGNOSIS — I161 Hypertensive emergency: Secondary | ICD-10-CM

## 2016-10-14 DIAGNOSIS — D649 Anemia, unspecified: Secondary | ICD-10-CM | POA: Diagnosis not present

## 2016-10-14 DIAGNOSIS — R079 Chest pain, unspecified: Secondary | ICD-10-CM | POA: Diagnosis present

## 2016-10-14 DIAGNOSIS — F1410 Cocaine abuse, uncomplicated: Secondary | ICD-10-CM

## 2016-10-14 DIAGNOSIS — T465X6A Underdosing of other antihypertensive drugs, initial encounter: Secondary | ICD-10-CM | POA: Diagnosis not present

## 2016-10-14 DIAGNOSIS — F419 Anxiety disorder, unspecified: Secondary | ICD-10-CM | POA: Diagnosis present

## 2016-10-14 DIAGNOSIS — I5042 Chronic combined systolic (congestive) and diastolic (congestive) heart failure: Secondary | ICD-10-CM | POA: Diagnosis not present

## 2016-10-14 DIAGNOSIS — R809 Proteinuria, unspecified: Secondary | ICD-10-CM | POA: Diagnosis present

## 2016-10-14 DIAGNOSIS — E876 Hypokalemia: Secondary | ICD-10-CM | POA: Diagnosis not present

## 2016-10-14 DIAGNOSIS — H269 Unspecified cataract: Secondary | ICD-10-CM | POA: Diagnosis present

## 2016-10-14 LAB — CBC
HCT: 34.1 % — ABNORMAL LOW (ref 36.0–46.0)
Hemoglobin: 10.5 g/dL — ABNORMAL LOW (ref 12.0–15.0)
MCH: 20.2 pg — ABNORMAL LOW (ref 26.0–34.0)
MCHC: 30.8 g/dL (ref 30.0–36.0)
MCV: 65.5 fL — ABNORMAL LOW (ref 78.0–100.0)
PLATELETS: 348 10*3/uL (ref 150–400)
RBC: 5.21 MIL/uL — ABNORMAL HIGH (ref 3.87–5.11)
RDW: 19.1 % — AB (ref 11.5–15.5)
WBC: 8.8 10*3/uL (ref 4.0–10.5)

## 2016-10-14 LAB — BASIC METABOLIC PANEL
Anion gap: 10 (ref 5–15)
BUN: 10 mg/dL (ref 6–20)
CO2: 22 mmol/L (ref 22–32)
CREATININE: 0.75 mg/dL (ref 0.44–1.00)
Calcium: 8.8 mg/dL — ABNORMAL LOW (ref 8.9–10.3)
Chloride: 107 mmol/L (ref 101–111)
GFR calc Af Amer: >60 mL/min (ref >60)
GLUCOSE: 103 mg/dL — AB (ref 65–99)
Potassium: 3.4 mmol/L — ABNORMAL LOW (ref 3.5–5.1)
SODIUM: 139 mmol/L (ref 135–145)

## 2016-10-14 LAB — RAPID URINE DRUG SCREEN, HOSP PERFORMED
Amphetamines: NOT DETECTED
BARBITURATES: NOT DETECTED
Benzodiazepines: NOT DETECTED
COCAINE: POSITIVE — AB
Opiates: POSITIVE — AB
Tetrahydrocannabinol: POSITIVE — AB

## 2016-10-14 LAB — URINALYSIS, ROUTINE W REFLEX MICROSCOPIC
Bilirubin Urine: NEGATIVE
Glucose, UA: NEGATIVE mg/dL
Hgb urine dipstick: NEGATIVE
Ketones, ur: NEGATIVE mg/dL
NITRITE: NEGATIVE
PROTEIN: 30 mg/dL — AB
SPECIFIC GRAVITY, URINE: 1.019 (ref 1.005–1.030)
pH: 6 (ref 5.0–8.0)

## 2016-10-14 LAB — MRSA PCR SCREENING: MRSA BY PCR: NEGATIVE

## 2016-10-14 LAB — I-STAT TROPONIN, ED
TROPONIN I, POC: 0 ng/mL (ref 0.00–0.08)
Troponin i, poc: 0.01 ng/mL (ref 0.00–0.08)

## 2016-10-14 LAB — TROPONIN I: TROPONIN I: 0.07 ng/mL — AB (ref <0.03)

## 2016-10-14 LAB — HEMOGLOBIN A1C
HEMOGLOBIN A1C: 5.3 % (ref 4.8–5.6)
MEAN PLASMA GLUCOSE: 105.41 mg/dL

## 2016-10-14 LAB — BRAIN NATRIURETIC PEPTIDE: B NATRIURETIC PEPTIDE 5: 183.8 pg/mL — AB (ref 0.0–100.0)

## 2016-10-14 MED ORDER — PAROXETINE HCL 20 MG PO TABS
20.0000 mg | ORAL_TABLET | Freq: Every day | ORAL | Status: DC
  Administered 2016-10-14 – 2016-10-18 (×5): 20 mg via ORAL
  Filled 2016-10-14 (×5): qty 1

## 2016-10-14 MED ORDER — PANTOPRAZOLE SODIUM 40 MG PO TBEC: 40.0000 mg | DELAYED_RELEASE_TABLET | Freq: Every day | ORAL | Status: DC

## 2016-10-14 MED ORDER — SODIUM CHLORIDE 0.9 % IV SOLN: 250.0000 mL | INTRAVENOUS | Status: DC | PRN

## 2016-10-14 MED ORDER — ONDANSETRON HCL 4 MG/2ML IJ SOLN
4.0000 mg | Freq: Once | INTRAMUSCULAR | Status: AC
Start: 2016-10-14 — End: 2016-10-14
  Administered 2016-10-14: 4 mg via INTRAVENOUS
  Filled 2016-10-14: qty 2

## 2016-10-14 MED ORDER — NITROPRUSSIDE SODIUM 25 MG/ML IV SOLN
0.0000 ug/kg/min | INTRAVENOUS | Status: DC
  Administered 2016-10-14: 0.3 ug/kg/min via INTRAVENOUS
  Administered 2016-10-15: 0.2 ug/kg/min via INTRAVENOUS
  Filled 2016-10-14 (×2): qty 2

## 2016-10-14 MED ORDER — NICARDIPINE HCL IN NACL 20-0.86 MG/200ML-% IV SOLN
3.0000 mg/h | INTRAVENOUS | Status: DC
  Administered 2016-10-14 (×2): 10 mg/h via INTRAVENOUS
  Administered 2016-10-14: 5 mg/h via INTRAVENOUS
  Administered 2016-10-14: 10 mg/h via INTRAVENOUS
  Administered 2016-10-15 (×2): 5 mg/h via INTRAVENOUS
  Administered 2016-10-15: 7.5 mg/h via INTRAVENOUS
  Administered 2016-10-15: 5 mg/h via INTRAVENOUS
  Filled 2016-10-14 (×9): qty 200

## 2016-10-14 MED ORDER — ASPIRIN EC 81 MG PO TBEC
81.0000 mg | DELAYED_RELEASE_TABLET | Freq: Every day | ORAL | Status: DC
  Administered 2016-10-15 – 2016-10-18 (×4): 81 mg via ORAL
  Filled 2016-10-14 (×4): qty 1

## 2016-10-14 MED ORDER — FAMOTIDINE 20 MG PO TABS
20.0000 mg | ORAL_TABLET | Freq: Every day | ORAL | Status: DC
  Administered 2016-10-14 – 2016-10-18 (×5): 20 mg via ORAL
  Filled 2016-10-14 (×5): qty 1

## 2016-10-14 MED ORDER — ADULT MULTIVITAMIN W/MINERALS CH
1.0000 | ORAL_TABLET | Freq: Every day | ORAL | Status: DC
  Administered 2016-10-15 – 2016-10-18 (×4): 1 via ORAL
  Filled 2016-10-14 (×4): qty 1

## 2016-10-14 MED ORDER — CLONIDINE HCL 0.2 MG PO TABS
0.3000 mg | ORAL_TABLET | Freq: Once | ORAL | Status: AC
  Administered 2016-10-14: 0.3 mg via ORAL
  Filled 2016-10-14: qty 1

## 2016-10-14 MED ORDER — CLONIDINE HCL 0.3 MG PO TABS
0.3000 mg | ORAL_TABLET | Freq: Three times a day (TID) | ORAL | Status: DC
  Administered 2016-10-14 – 2016-10-16 (×6): 0.3 mg via ORAL
  Filled 2016-10-14 (×6): qty 1

## 2016-10-14 MED ORDER — ACETAMINOPHEN 325 MG PO TABS
650.0000 mg | ORAL_TABLET | Freq: Four times a day (QID) | ORAL | Status: DC | PRN
  Administered 2016-10-14: 650 mg via ORAL
  Filled 2016-10-14: qty 2

## 2016-10-14 MED ORDER — ENOXAPARIN SODIUM 40 MG/0.4ML ~~LOC~~ SOLN
40.0000 mg | SUBCUTANEOUS | Status: DC
  Administered 2016-10-15 – 2016-10-18 (×4): 40 mg via SUBCUTANEOUS
  Filled 2016-10-14 (×5): qty 0.4

## 2016-10-14 MED ORDER — NITROGLYCERIN 0.4 MG SL SUBL: 0.4000 mg | SUBLINGUAL_TABLET | SUBLINGUAL | Status: DC | PRN

## 2016-10-14 MED ORDER — HYDRALAZINE HCL 20 MG/ML IJ SOLN
20.0000 mg | Freq: Once | INTRAMUSCULAR | Status: AC
  Administered 2016-10-14: 20 mg via INTRAVENOUS
  Filled 2016-10-14: qty 1

## 2016-10-14 MED ORDER — MORPHINE SULFATE (PF) 4 MG/ML IV SOLN
4.0000 mg | Freq: Once | INTRAVENOUS | Status: AC
  Administered 2016-10-14: 4 mg via INTRAVENOUS
  Filled 2016-10-14: qty 1

## 2016-10-14 MED ORDER — HYDRALAZINE HCL 20 MG/ML IJ SOLN
20.0000 mg | Freq: Once | INTRAMUSCULAR | Status: AC
Start: 2016-10-14 — End: 2016-10-14
  Administered 2016-10-14: 20 mg via INTRAVENOUS
  Filled 2016-10-14: qty 1

## 2016-10-14 MED ORDER — HYDRALAZINE HCL 50 MG PO TABS
100.0000 mg | ORAL_TABLET | Freq: Three times a day (TID) | ORAL | Status: DC
  Administered 2016-10-14 – 2016-10-16 (×5): 100 mg via ORAL
  Filled 2016-10-14 (×8): qty 2

## 2016-10-14 MED ORDER — SODIUM CHLORIDE 0.9% FLUSH
3.0000 mL | Freq: Two times a day (BID) | INTRAVENOUS | Status: DC
  Administered 2016-10-14 – 2016-10-18 (×8): 3 mL via INTRAVENOUS

## 2016-10-14 MED ORDER — ACETAMINOPHEN 650 MG RE SUPP: 650.0000 mg | Freq: Four times a day (QID) | RECTAL | Status: DC | PRN

## 2016-10-14 MED ORDER — LISINOPRIL 20 MG PO TABS
20.0000 mg | ORAL_TABLET | Freq: Every day | ORAL | Status: DC
  Administered 2016-10-14: 20 mg via ORAL
  Filled 2016-10-14: qty 1

## 2016-10-14 MED ORDER — HYDRALAZINE HCL 20 MG/ML IJ SOLN
20.0000 mg | INTRAMUSCULAR | Status: DC | PRN
  Administered 2016-10-14 – 2016-10-17 (×2): 20 mg via INTRAVENOUS
  Filled 2016-10-14 (×2): qty 1

## 2016-10-14 MED ORDER — ONDANSETRON HCL 4 MG/2ML IJ SOLN
4.0000 mg | Freq: Four times a day (QID) | INTRAMUSCULAR | Status: DC | PRN
  Administered 2016-10-14: 4 mg via INTRAVENOUS
  Filled 2016-10-14: qty 2

## 2016-10-14 MED ORDER — ACETAMINOPHEN 500 MG PO TABS
1000.0000 mg | ORAL_TABLET | Freq: Three times a day (TID) | ORAL | Status: DC | PRN
  Administered 2016-10-14: 1000 mg via ORAL
  Filled 2016-10-14: qty 2

## 2016-10-14 MED ORDER — MORPHINE SULFATE (PF) 2 MG/ML IV SOLN
2.0000 mg | INTRAVENOUS | Status: DC | PRN
  Administered 2016-10-14 – 2016-10-16 (×8): 2 mg via INTRAVENOUS
  Filled 2016-10-14 (×8): qty 1

## 2016-10-14 NOTE — ED Notes (Signed)
Pt c/o severe pain in head-- attending for admitting dr's at bedside.

## 2016-10-14 NOTE — ED Notes (Signed)
Pt c/o severe chest pain- "unable to breath" pt resp rapid, unlabored, - dr informed, orders received.

## 2016-10-14 NOTE — ED Notes (Signed)
Pt c/o severe headache- rolling in bed, crying, also c/o chest tightness.

## 2016-10-14 NOTE — Progress Notes (Signed)
CRITICAL VALUE ALERT  Critical Value:  troponin  Date & Time Notied:  10/14/16 10:16 PM  Provider Notified: elink  Orders Received/Actions taken: no new orders at this time
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
## 2016-10-14 NOTE — Progress Notes (Signed)
eLink Physician-Brief Progress Note Patient Name: Kelly Novak DOB: February 01, 1971 MRN: [id number]   Date of Service  10/14/2016  HPI/Events of Note  HTN crisis from cocaine  eICU Interventions  No new orders     Intervention Category Evaluation Type: New Patient Evaluation  Flora Lipps 10/14/2016, 6:50 PM

## 2016-10-14 NOTE — ED Notes (Signed)
Pt reporting headache, L sided CP and body aches and SOB that began yesterday when she ran out of her antihypertensives; pt states she was in jail for missing a court date while she was hospitalized for R AKA; pt missed PCP appointment while in jail and cannot see him again until Sept 26th; pt requesting medication refill

## 2016-10-14 NOTE — ED Provider Notes (Addendum)
[facility] DEPT Provider Note   CSN: [id number] Arrival date & time: 10/13/16  2309     History   Chief Complaint Chief Complaint  Patient presents with  . Hypertension  . Chest Pain    HPI Kelly Novak is a 46 y.o. female.  Patient presents to the ER with headache, chest pain, elevated blood pressure. Patient has a known history of hypertension. She recently underwent right above-the-knee amputation after suffering necrotizing fasciitis. Patient reports that while she was in the hospital for this, she missed a court date. Upon discharge from the hospital after her surgery, she was arrested for missing a court day and put in jail. While she was in jail she missed her primary care physician follow-up and has been unable to reschedule it. She has run out of her blood pressure medications and her primary care physician would not refill it without seeing her in the office.she ran out of her medicines 2 days ago. Since then she has had progressive worsening of the symptoms mentioned.      Past Medical History:  Diagnosis Date  . Anemia   . Anxiety   . Arthritis   . Bipolar 1 disorder ([place])   . Blood dyscrasia    sickle cell trait  . Chronic back pain   . Chronic systolic (congestive) heart failure ([place])   . GERD (gastroesophageal reflux disease)   . Hypertension   . Obesity   . Polysubstance abuse   . Shortness of breath    on exertion due to weight  . Stroke [facility])     Patient Active Problem List   Diagnosis Date Noted  . S/P AKA (above knee amputation) unilateral, right ([place]) 09/17/2016  . Bipolar disorder ([place]) 08/31/2016  . History of stroke 08/07/2016  . Polysubstance abuse   . Chronic systolic (congestive) heart failure ([place])   . Depression 11/03/2015  . GERD (gastroesophageal reflux disease) 11/03/2015  . Chronic back pain   . Uterine fibroids 08/10/2013  . ASCUS pap with negative HRHPV 08/10/2013  . Pes planus (flat feet) 09/02/2012  . Anemia 09/02/2012   . Smoker 09/02/2012    Past Surgical History:  Procedure Laterality Date  . AMPUTATION Right 09/04/2016   Procedure: Right Above Knee Amputation;  Surgeon: Newt Minion, MD;  Location: [facility];  Service: Orthopedics;  Laterality: Right;  . CESAREAN SECTION    . CHOLECYSTECTOMY    . HYSTEROSCOPY N/A 08/23/2013   Procedure: HYSTEROSCOPY D&C WITH HYDROTHERMAL ABLATION;  Surgeon: Osborne Oman, MD;  Location: [facility];  Service: Gynecology;  Laterality: N/A;  . I&D EXTREMITY Right 09/02/2016   Procedure: IRRIGATION AND DEBRIDEMENT EXTREMITY;  Surgeon: Newt Minion, MD;  Location: [facility];  Service: Orthopedics;  Laterality: Right;  . TUBAL LIGATION      OB History    Gravida Para Term Preterm AB Living   4 4 4     2    SAB TAB Ectopic Multiple Live Births           4       Home Medications    Prior to Admission medications   Medication Sig Start Date End Date Taking? Authorizing Provider  acetaminophen (TYLENOL) 325 MG tablet Take 2 tablets (650 mg total) by mouth every 6 (six) hours as needed for mild pain (or Fever >/= 101). 09/10/16   Robbie Lis, MD  Amino Acids-Protein Hydrolys (FEEDING SUPPLEMENT, PRO-STAT SUGAR FREE 64,) LIQD Take 30 mLs by mouth 2 (two) times  daily. 09/17/16   Argentina Donovan, PA-C  aspirin EC 81 MG EC tablet Take 1 tablet (81 mg total) by mouth daily. 11/05/15   Florencia Reasons, MD  Cariprazine HCl (VRAYLAR) 4.5 MG CAPS Take 1 capsule (4.5 mg total) by mouth at bedtime. 08/09/16   Mariel Aloe, MD  carvedilol (COREG) 6.25 MG tablet Take 1 tablet (6.25 mg total) by mouth 2 (two) times daily with a meal. 09/17/16   McClung, Dionne Bucy, PA-C  cloNIDine (CATAPRES) 0.3 MG tablet Take 1 tablet (0.3 mg total) by mouth 3 (three) times daily. 10/02/16   Argentina Donovan, PA-C  docusate sodium (COLACE) 100 MG capsule Take 1 capsule (100 mg total) by mouth 2 (two) times daily. 09/10/16   Robbie Lis, MD  famotidine (PEPCID) 20 MG tablet Take 1 tablet (20 mg total) by mouth  daily. 09/17/16   Argentina Donovan, PA-C  hydrALAZINE (APRESOLINE) 100 MG tablet Take 1 tablet (100 mg total) by mouth every 8 (eight) hours. 09/17/16   Argentina Donovan, PA-C  Multiple Vitamin (MULTIVITAMIN WITH MINERALS) TABS tablet Take 1 tablet by mouth daily. 09/11/16   Robbie Lis, MD  nitroGLYCERIN (NITROSTAT) 0.4 MG SL tablet Place 1 tablet (0.4 mg total) under the tongue every 5 (five) minutes as needed for chest pain. 11/05/15   Florencia Reasons, MD  ondansetron (ZOFRAN) 4 MG tablet Take 1 tablet (4 mg total) by mouth every 6 (six) hours as needed for nausea. 09/17/16   Argentina Donovan, PA-C  oxyCODONE-acetaminophen (PERCOCET/ROXICET) 5-325 MG tablet Take 1 tablet by mouth 2 (two) times daily as needed for severe pain. 10/08/16   Suzan Slick, NP  PARoxetine (PAXIL) 20 MG tablet Take 1 tablet (20 mg total) by mouth daily. 08/09/16   Mariel Aloe, MD  silver sulfADIAZINE (SILVADENE) 1 % cream Apply 1 application topically daily. 09/24/16   Suzan Slick, NP  traMADol (ULTRAM) 50 MG tablet Take 1 tablet (50 mg total) by mouth every 6 (six) hours as needed. 09/17/16   Argentina Donovan, PA-C    Family History Family History  Problem Relation Age of Onset  . CAD Father   . Heart failure Brother   . Hypertension Brother   . CAD Paternal Aunt     Social History Social History  Substance Use Topics  . Smoking status: Current Every Day Smoker    Packs/day: 0.50    Years: 14.00    Types: Cigarettes  . Smokeless tobacco: Never Used  . Alcohol use No     Allergies   Tape   Review of Systems Review of Systems  Respiratory: Positive for shortness of breath.   Cardiovascular: Positive for chest pain.  All other systems reviewed and are negative.    Physical Exam Updated Vital Signs BP (!) 217/97   Pulse 99   Temp 98.6 F (37 C) (Oral)   Resp (!) 25   Ht 5\' 7"  (1.702 m)   Wt (!) 137.4 kg (303 lb)   LMP 08/06/2013   SpO2 98%   BMI 47.46 kg/m   Physical Exam    Constitutional: She is oriented to person, place, and time. She appears well-developed and well-nourished. No distress.  HENT:  Head: Normocephalic and atraumatic.  Right Ear: Hearing normal.  Left Ear: Hearing normal.  Nose: Nose normal.  Mouth/Throat: Oropharynx is clear and moist and mucous membranes are normal.  Eyes: Pupils are equal, round, and reactive to light. Conjunctivae and EOM  are normal.  Neck: Normal range of motion. Neck supple.  Cardiovascular: Regular rhythm, S1 normal and S2 normal.  Exam reveals no gallop and no friction rub.   No murmur heard. Pulmonary/Chest: Effort normal and breath sounds normal. No respiratory distress. She exhibits no tenderness.  Abdominal: Soft. Normal appearance and bowel sounds are normal. There is no hepatosplenomegaly. There is no tenderness. There is no rebound, no guarding, no tenderness at McBurney's point and negative Murphy's sign. No hernia.  Musculoskeletal: Normal range of motion.  Right AKA  Neurological: She is alert and oriented to person, place, and time. She has normal strength. No cranial nerve deficit or sensory deficit. Coordination normal. GCS eye subscore is 4. GCS verbal subscore is 5. GCS motor subscore is 6.  Skin: Skin is warm, dry and intact. No rash noted. No cyanosis.  Psychiatric: She has a normal mood and affect. Her speech is normal and behavior is normal. Thought content normal.  Nursing note and vitals reviewed.    ED Treatments / Results  Labs (all labs ordered are listed, but only abnormal results are displayed) Labs Reviewed  BASIC METABOLIC PANEL - Abnormal; Notable for the following:       Result Value   Potassium 3.4 (*)    Glucose, Bld 103 (*)    Calcium 8.8 (*)    All other components within normal limits  CBC - Abnormal; Notable for the following:    RBC 5.21 (*)    Hemoglobin 10.5 (*)    HCT 34.1 (*)    MCV 65.5 (*)    MCH 20.2 (*)    RDW 19.1 (*)    All other components within normal  limits  BRAIN NATRIURETIC PEPTIDE - Abnormal; Notable for the following:    B Natriuretic Peptide 183.8 (*)    All other components within normal limits  I-STAT TROPONIN, ED  I-STAT TROPONIN, ED    EKG  EKG Interpretation  Date/Time:  Tuesday October 13 2016 23:26:18 EDT Ventricular Rate:  103 PR Interval:  176 QRS Duration: 80 QT Interval:  384 QTC Calculation: 503 R Axis:   23 Text Interpretation:  Sinus tachycardia Otherwise normal ECG Confirmed by Orpah Greek [phone]) on 10/14/2016 7:09:36 AM       Radiology Dg Chest 2 View  Result Date: 10/13/2016 CLINICAL DATA:  Chest pain. EXAM: CHEST  2 VIEW COMPARISON:  Radiographs of September 01, 2016. FINDINGS: The heart size and mediastinal contours are within normal limits. Both lungs are clear. No pneumothorax or pleural effusion is noted. The visualized skeletal structures are unremarkable. IMPRESSION: No active cardiopulmonary disease. Electronically Signed   By: Marijo Conception, M.D.   On: 10/13/2016 23:45    Procedures Procedures (including critical care time)  Medications Ordered in ED Medications  hydrALAZINE (APRESOLINE) injection 20 mg (not administered)  hydrALAZINE (APRESOLINE) injection 20 mg (20 mg Intravenous Given 10/14/16 0630)  morphine 4 MG/ML injection 4 mg (4 mg Intravenous Given 10/14/16 0640)  ondansetron (ZOFRAN) injection 4 mg (4 mg Intravenous Given 10/14/16 0640)  cloNIDine (CATAPRES) tablet 0.3 mg (0.3 mg Oral Given 10/14/16 0702)     Initial Impression / Assessment and Plan / ED Course  I have reviewed the triage vital signs and the nursing notes.  Pertinent labs & imaging results that were available during my care of the patient were reviewed by me and considered in my medical decision making (see chart for details).     Patient with known history of hypertension  and systolic congestive heart failure resents to the ER for evaluation of chest pain and accelerated hypertension. EKG at arrival  does not show any ischemia or infarct. She is markedly hypertensive. She has been administered hydralazine and clonidine without any significant improvement. Troponin is negative. She'll require further treatment for hypertensive urgency, will admit.  Final Clinical Impressions(s) / ED Diagnoses   Final diagnoses:  Hypertensive urgency    New Prescriptions New Prescriptions   No medications on file     Orpah Greek, MD 10/14/16 0725    Orpah Greek, MD 10/28/16 [phone]

## 2016-10-14 NOTE — H&P (Signed)
Date: 10/14/2016               Patient Name:  Kelly Novak MRN: [id number]  DOB: 1970/03/27 Age / Sex: 46 y.o., female   PCP: Patient, No Pcp Per         Medical Service: Internal Medicine Teaching Service         Attending Physician: Dr. Oval Linsey, MD    First Contact: Dr. Colbert Ewing Pager: [phone]  Second Contact: Dr. Maryellen Pile Pager: [phone]       After Hours (After 5p/  First Contact Pager: [phone]  weekends / holidays): Second Contact Pager: [phone]   Chief Complaint: headache, chest pain, elevated blood pressure - "I just don't feel good"  History of Present Illness:  Kelly Novak is a 46yo female with PMH significant for HTN, HFrEF (last echo 10/2015 with EF 45% and grade 1 DD), depression, polysubstance abuse (cocaine and THC), and R AKA from necrotizing fascitis 2/2 crush injury from [facility] who presents with a 1-day history of headache and sharp chest pain.  She was recently admitted 7/9-7/19 for necrotizing fascitis, received vanc/zosyn (7/9-14) s/p R AKA on 7/13. Discharged without antibiotics and has been following up with Ortho clinic. During her hospitalization, she missed a court date and upon discharge from the hospital, she was arrested and put in jail. While in jail, she reports that they did not give her any medications and she missed her PCP appointment. Upon being released from jail, she reports that she could not reschedule an appointment with her PCP and her PCP would not refill her medications without seeing her in the office. She ran out of her blood pressure medications on Monday. She normally takes clonidine, hydralazine, and carvedilol.  Yesterday, she was lying in bed when she started developing a throbbing left-sided headache and sharp constant chest pain. The chest pain is now intermittent and does not radiate to her arms, neck, or back. She does feel short of breath, nauseous, and warm. Denies emesis, blurry vision, or other changes in her  vision. She denies dysuria or hematuria.  She smokes 4 cigarettes per day. Denies alcohol use. Endorses cocaine and marijuana use. Last used cocaine Sunday. Last used marijuana yesterday.  In the ER, BP significantly elevated to 236/128, HR 75, temp 98.6, RR 18, O2 sats 96% on RA. Received PO clonidine 0.3mg  x1 and IV hydralazine 20mg  x2 without much improvement in her BP. Troponin negative x2. EKG with sinus tachycardia; no ischemia or infarct. CXR with no active cardiopulmonary disease. BNP 183 (prior was 175 with similar symptoms). Re-started home anti-hypertensives - clonidine 0.3mg  TID, hydralazine 100mg  TID. Holding carvedilol due to reported cocaine use.  Meds:  Current Meds  Medication Sig  . acetaminophen (TYLENOL) 325 MG tablet Take 2 tablets (650 mg total) by mouth every 6 (six) hours as needed for mild pain (or Fever >/= 101).  . Amino Acids-Protein Hydrolys (FEEDING SUPPLEMENT, PRO-STAT SUGAR FREE 64,) LIQD Take 30 mLs by mouth 2 (two) times daily.  Marland Kitchen aspirin EC 81 MG EC tablet Take 1 tablet (81 mg total) by mouth daily.  . Cariprazine HCl (VRAYLAR) 4.5 MG CAPS Take 1 capsule (4.5 mg total) by mouth at bedtime.  . carvedilol (COREG) 6.25 MG tablet Take 1 tablet (6.25 mg total) by mouth 2 (two) times daily with a meal.  . cloNIDine (CATAPRES) 0.3 MG tablet Take 1 tablet (0.3 mg total) by mouth 3 (three) times daily.  Marland Kitchen docusate sodium (  COLACE) 100 MG capsule Take 1 capsule (100 mg total) by mouth 2 (two) times daily.  . famotidine (PEPCID) 20 MG tablet Take 1 tablet (20 mg total) by mouth daily.  . hydrALAZINE (APRESOLINE) 100 MG tablet Take 1 tablet (100 mg total) by mouth every 8 (eight) hours.  . Multiple Vitamin (MULTIVITAMIN WITH MINERALS) TABS tablet Take 1 tablet by mouth daily.  . nitroGLYCERIN (NITROSTAT) 0.4 MG SL tablet Place 1 tablet (0.4 mg total) under the tongue every 5 (five) minutes as needed for chest pain.  Marland Kitchen ondansetron (ZOFRAN) 4 MG tablet Take 1 tablet (4 mg  total) by mouth every 6 (six) hours as needed for nausea.  Marland Kitchen oxyCODONE-acetaminophen (PERCOCET/ROXICET) 5-325 MG tablet Take 1 tablet by mouth 2 (two) times daily as needed for severe pain.  Marland Kitchen PARoxetine (PAXIL) 20 MG tablet Take 1 tablet (20 mg total) by mouth daily.  . silver sulfADIAZINE (SILVADENE) 1 % cream Apply 1 application topically daily.  . traMADol (ULTRAM) 50 MG tablet Take 1 tablet (50 mg total) by mouth every 6 (six) hours as needed. (Patient taking differently: Take 50 mg by mouth every 6 (six) hours as needed for moderate pain. )     Allergies: Allergies as of 10/13/2016 - Review Complete 10/13/2016  Allergen Reaction Noted  . Tape Itching and Rash 08/31/2016   Past Medical History:  Diagnosis Date  . Anemia   . Anxiety   . Arthritis   . Bipolar 1 disorder ([place])   . Blood dyscrasia    sickle cell trait  . Chronic back pain   . Chronic systolic (congestive) heart failure ([place])   . GERD (gastroesophageal reflux disease)   . Hypertension   . Obesity   . Polysubstance abuse   . Shortness of breath    on exertion due to weight  . Stroke [facility])     Family History:  Family History  Problem Relation Age of Onset  . CAD Father   . Heart failure Brother   . Hypertension Brother   . CAD Paternal Aunt    Social History:  - smokes 4 cigarettes per day. - denies alcohol use - endorses cocaine and marijuana use - last cocaine Sunday, last marijuana yesterday  Review of Systems: A complete ROS was negative except as per HPI.  Physical Exam: Blood pressure (!) 238/103, pulse (!) 109, temperature 99 F (37.2 C), temperature source Oral, resp. rate 19, height 5\' 7"  (1.702 m), weight (!) 303 lb (137.4 kg), last menstrual period 08/06/2013, SpO2 98 %.  GEN: morbidly obese AA female; alert and oriented; tearful.  RESP: Clear to auscultation bilaterally. No wheezes, rales, or rhonchi. No increased work of breathing CV: Tachycardic. Regular rhythm. No murmur appreciated.  No JVD elevation. No LLE edema. Anterior chest slightly sore to palpation. ABD: Soft. Non-tender. Non-distended. Normoactive bowel sounds. EXT: No LLE edema. 1+ DP pulse in LLE. R AKA  Labs CBC Latest Ref Rng & Units 10/13/2016 09/10/2016 09/09/2016  WBC 4.0 - 10.5 K/uL 8.8 12.8(H) 12.3(H)  Hemoglobin 12.0 - 15.0 g/dL 10.5(L) 7.5(L) 7.3(L)  Hematocrit 36.0 - 46.0 % 34.1(L) 24.2(L) 23.0(L)  Platelets 150 - 400 K/uL 348 538(H) 457(H)   BMP Latest Ref Rng & Units 10/13/2016 09/10/2016 09/09/2016  Glucose 65 - 99 mg/dL 103(H) 119(H) 103(H)  BUN 6 - 20 mg/dL 10 31(H) 34(H)  Creatinine 0.44 - 1.00 mg/dL 0.75 1.78(H) 1.72(H)  Sodium 135 - 145 mmol/L 139 137 135  Potassium 3.5 - 5.1 mmol/L 3.4(L)  4.6 4.8  Chloride 101 - 111 mmol/L 107 107 104  CO2 22 - 32 mmol/L 22 23 22   Calcium 8.9 - 10.3 mg/dL 8.8(L) 7.7(L) 7.6(L)   Troponin 0.01 -> 0.00 -> <0.03 BNP 183 UA with 30 protein, negative nitrites, small leukocytes, 0-5 RBC, 6-30 WBC, rare bacteria, 0-5 squam epithelial UDS positive for cocaine, THC, and opiates  EKG: sinus tachycardia  CXR: no active cardiopulmonary disease  Assessment & Plan by Problem: Active Problems:   Hypertensive urgency  Kelly Novak is a 46yo female with PMH significant for HTN, HFrEF (last echo 10/2015 with EF 45% and grade 1 DD), depression, polysubstance abuse (cocaine and THC), and 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  # Hypertensive urgency BP in the 200s/100s on admission. Not responsive to clonidine or IV hydralazine x2. She reports she ran out of her medication 2 days ago, including clonidine. She also used cocaine on Sunday. Cr is 0.75. No hematuria. Endorses severe 10/10 headache. CT head negative. Hypertensive urgency likely secondary to rebound hypertension after not taking clonidine for 2  days. This is possibly further exacerbated by recent cocaine use. Patient is also fairly anxious and tearful. Unsure if blood pressure cuff readings are accurate and may be affected by patient's anxiety. - Admission MAP was 140. 24 hour MAP goal is 120. - continue home clonidine 0.3mg  TID and hydralazine 100mg  TID - add lisinopril 20mg  daily - IV hydralazine 20mg  q4h PRN for SBP >180, DBP >100 - consider nicardipine drip or clevidipine drip if patient continues to have elevated BPs - avoid beta blockers due to recent cocaine use --- hold home carvedilol - admit to SDU - CBC & BMP tomorrow AM  # Chest pain Constant chest pain that is intermittently sharp. Patient is sore to palpation on chest wall.  Troponin neg x3. EKG without ST changes. Need to rule out ACS. Most likely due to demand ischemia 2/2 hypertensive urgency. May alternatively be related to vasoconstriction from cocaine use or cocaine-induced myocardial ischemia. Another consideration is PE. Patient has been tachycardic, however, patient has not been hypoxic and does not appear to have increased work of breathing on exam. Pain also does not appear to be pleuritic in nature. Modified Well's criteria of 1.5 (for HR >100), which puts her at a low pre-test probability of PE. - Continue ASA - hold home carvedilol - repeat EKG tomorrow AM  # Headache Likely secondary to hypertension and clonidine withdrawal headache. CT head negative. - acetaminophen 1000mg  q8h PRN - toradol IM if needed  # Asymptomatic bacteriuria UA with small leukocytes, 6-30 WBC, rare bacteria. Patient denies dysuria. - Will hold on antibiotics - Continue to monitor  # HFrEF (LVEF 45-50% in 10/2015) BNP is slightly elevated, however patient does not seem volume overloaded on exam. CXR without pulmonary edema. - hold home carvedilol due to recent cocaine use - add lisinopril 20mg  qday  # Depression - Continue home paroxetine  # Proteinuria By UA - 30  protein. No LE edema. No known PMH of diabetes. - A1c  Dispo: Admit patient to Inpatient with expected length of stay greater than 2 midnights.  Signed: Colbert Ewing, MD Internal Medicine, PGY-1 10/14/2016, 9:24 AM  P [phone]

## 2016-10-14 NOTE — ED Notes (Signed)
Pt to CT for head CT

## 2016-10-15 DIAGNOSIS — I160 Hypertensive urgency: Principal | ICD-10-CM

## 2016-10-15 DIAGNOSIS — F1410 Cocaine abuse, uncomplicated: Secondary | ICD-10-CM

## 2016-10-15 DIAGNOSIS — G9340 Encephalopathy, unspecified: Secondary | ICD-10-CM

## 2016-10-15 LAB — CBC
HCT: 34 % — ABNORMAL LOW (ref 36.0–46.0)
Hemoglobin: 10.6 g/dL — ABNORMAL LOW (ref 12.0–15.0)
MCH: 20.3 pg — AB (ref 26.0–34.0)
MCHC: 31.2 g/dL (ref 30.0–36.0)
MCV: 65 fL — AB (ref 78.0–100.0)
PLATELETS: 362 10*3/uL (ref 150–400)
RBC: 5.23 MIL/uL — AB (ref 3.87–5.11)
RDW: 19.4 % — AB (ref 11.5–15.5)
WBC: 10.7 10*3/uL — AB (ref 4.0–10.5)

## 2016-10-15 LAB — BASIC METABOLIC PANEL
ANION GAP: 8 (ref 5–15)
BUN: 7 mg/dL (ref 6–20)
CALCIUM: 8.8 mg/dL — AB (ref 8.9–10.3)
CO2: 23 mmol/L (ref 22–32)
Chloride: 105 mmol/L (ref 101–111)
Creatinine, Ser: 0.63 mg/dL (ref 0.44–1.00)
GFR calc Af Amer: >60 mL/min (ref >60)
GLUCOSE: 113 mg/dL — AB (ref 65–99)
Potassium: 3.1 mmol/L — ABNORMAL LOW (ref 3.5–5.1)
SODIUM: 136 mmol/L (ref 135–145)

## 2016-10-15 LAB — PHOSPHORUS: PHOSPHORUS: 4.8 mg/dL — AB (ref 2.5–4.6)

## 2016-10-15 LAB — MAGNESIUM: MAGNESIUM: 1.7 mg/dL (ref 1.7–2.4)

## 2016-10-15 MED ORDER — POTASSIUM CHLORIDE CRYS ER 20 MEQ PO TBCR
30.0000 meq | EXTENDED_RELEASE_TABLET | ORAL | Status: AC
  Administered 2016-10-15 (×2): 30 meq via ORAL
  Filled 2016-10-15 (×2): qty 1

## 2016-10-15 MED ORDER — CALCIUM CARBONATE ANTACID 500 MG PO CHEW
1.0000 | CHEWABLE_TABLET | Freq: Four times a day (QID) | ORAL | Status: DC | PRN
  Administered 2016-10-15 – 2016-10-16 (×2): 200 mg via ORAL
  Filled 2016-10-15 (×2): qty 1

## 2016-10-15 NOTE — Progress Notes (Signed)
eLink Physician-Brief Progress Note Patient Name: Kelly Novak DOB: February 05, 1971 MRN: [id number]   Date of Service  10/15/2016  HPI/Events of Note  Heartburn  eICU Interventions  Will order: 1. Tums 1 tablet PO Q 6 hours PRN heartburn.      Intervention Category Major Interventions: Other:  Lysle Dingwall 10/15/2016, 12:23 AM

## 2016-10-15 NOTE — Progress Notes (Signed)
[facility] ADULT ICU REPLACEMENT PROTOCOL FOR AM LAB REPLACEMENT ONLY  The patient does apply for the [facility] Adult ICU Electrolyte Replacment Protocol based on the criteria listed below:   1. Is GFR >/= 40 ml/min? Yes.    Patient's GFR today is >60 2. Is urine output >/= 0.5 ml/kg/hr for the last 6 hours? Yes.   Patient's UOP is 1.2 ml/kg/hr 3. Is BUN < 60 mg/dL? Yes.    Patient's BUN today is 7 4. Abnormal electrolyte(s): k 3.1 5. Ordered repletion with: protocol 6. If a panic level lab has been reported, has the CCM MD in charge been notified? No..   Physician:    Ronda Fairly A 10/15/2016 6:29 AM
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
## 2016-10-16 DIAGNOSIS — M79651 Pain in right thigh: Secondary | ICD-10-CM

## 2016-10-16 DIAGNOSIS — I110 Hypertensive heart disease with heart failure: Secondary | ICD-10-CM

## 2016-10-16 DIAGNOSIS — Z8739 Personal history of other diseases of the musculoskeletal system and connective tissue: Secondary | ICD-10-CM

## 2016-10-16 DIAGNOSIS — F1210 Cannabis abuse, uncomplicated: Secondary | ICD-10-CM

## 2016-10-16 DIAGNOSIS — Z89611 Acquired absence of right leg above knee: Secondary | ICD-10-CM

## 2016-10-16 DIAGNOSIS — F1410 Cocaine abuse, uncomplicated: Secondary | ICD-10-CM

## 2016-10-16 DIAGNOSIS — T8789 Other complications of amputation stump: Secondary | ICD-10-CM

## 2016-10-16 DIAGNOSIS — Z79899 Other long term (current) drug therapy: Secondary | ICD-10-CM

## 2016-10-16 DIAGNOSIS — R8271 Bacteriuria: Secondary | ICD-10-CM

## 2016-10-16 DIAGNOSIS — I5022 Chronic systolic (congestive) heart failure: Secondary | ICD-10-CM

## 2016-10-16 DIAGNOSIS — F329 Major depressive disorder, single episode, unspecified: Secondary | ICD-10-CM

## 2016-10-16 LAB — CBC
HEMATOCRIT: 33.4 % — AB (ref 36.0–46.0)
Hemoglobin: 10.4 g/dL — ABNORMAL LOW (ref 12.0–15.0)
MCH: 20.7 pg — AB (ref 26.0–34.0)
MCHC: 31.1 g/dL (ref 30.0–36.0)
MCV: 66.5 fL — ABNORMAL LOW (ref 78.0–100.0)
Platelets: 320 10*3/uL (ref 150–400)
RBC: 5.02 MIL/uL (ref 3.87–5.11)
RDW: 19.8 % — AB (ref 11.5–15.5)
WBC: 6.8 10*3/uL (ref 4.0–10.5)

## 2016-10-16 LAB — BASIC METABOLIC PANEL
ANION GAP: 8 (ref 5–15)
BUN: 14 mg/dL (ref 6–20)
CALCIUM: 8.9 mg/dL (ref 8.9–10.3)
CO2: 23 mmol/L (ref 22–32)
Chloride: 108 mmol/L (ref 101–111)
Creatinine, Ser: 0.86 mg/dL (ref 0.44–1.00)
GFR calc Af Amer: >60 mL/min (ref >60)
GLUCOSE: 105 mg/dL — AB (ref 65–99)
POTASSIUM: 3.9 mmol/L (ref 3.5–5.1)
Sodium: 139 mmol/L (ref 135–145)

## 2016-10-16 LAB — PHOSPHORUS: Phosphorus: 5.2 mg/dL — ABNORMAL HIGH (ref 2.5–4.6)

## 2016-10-16 LAB — MAGNESIUM: Magnesium: 2 mg/dL (ref 1.7–2.4)

## 2016-10-16 MED ORDER — AMLODIPINE BESYLATE 10 MG PO TABS
10.0000 mg | ORAL_TABLET | Freq: Every day | ORAL | Status: DC
  Administered 2016-10-16 – 2016-10-18 (×3): 10 mg via ORAL
  Filled 2016-10-16 (×3): qty 1

## 2016-10-16 MED ORDER — TRAMADOL HCL 50 MG PO TABS
50.0000 mg | ORAL_TABLET | Freq: Four times a day (QID) | ORAL | Status: DC | PRN
  Administered 2016-10-16 – 2016-10-18 (×7): 50 mg via ORAL
  Filled 2016-10-16 (×7): qty 1

## 2016-10-16 MED ORDER — HYDROCHLOROTHIAZIDE 25 MG PO TABS
25.0000 mg | ORAL_TABLET | Freq: Every day | ORAL | Status: DC
  Administered 2016-10-16 – 2016-10-18 (×3): 25 mg via ORAL
  Filled 2016-10-16 (×4): qty 1

## 2016-10-16 MED ORDER — LISINOPRIL 40 MG PO TABS
40.0000 mg | ORAL_TABLET | Freq: Every day | ORAL | Status: DC
  Administered 2016-10-16 – 2016-10-18 (×3): 40 mg via ORAL
  Filled 2016-10-16 (×3): qty 1

## 2016-10-16 MED ORDER — OXYCODONE-ACETAMINOPHEN 5-325 MG PO TABS
1.0000 | ORAL_TABLET | Freq: Once | ORAL | Status: AC | PRN
  Administered 2016-10-16: 1 via ORAL
  Filled 2016-10-16: qty 1

## 2016-10-16 MED ORDER — CLONIDINE HCL 0.2 MG PO TABS
0.2000 mg | ORAL_TABLET | Freq: Three times a day (TID) | ORAL | Status: DC
  Administered 2016-10-16 – 2016-10-18 (×6): 0.2 mg via ORAL
  Filled 2016-10-16 (×7): qty 1

## 2016-10-16 MED ORDER — KETOROLAC TROMETHAMINE 30 MG/ML IJ SOLN
30.0000 mg | Freq: Four times a day (QID) | INTRAMUSCULAR | Status: DC | PRN
  Administered 2016-10-16 – 2016-10-18 (×5): 30 mg via INTRAVENOUS
  Filled 2016-10-16 (×5): qty 1

## 2016-10-16 NOTE — Discharge Summary (Signed)
Name: Kelly Novak MRN: [id number] DOB: 1970-03-26 46 y.o. PCP: Patient, No Pcp Per  Date of Admission: 10/14/2016  4:02 AM Date of Discharge: 10/18/2016 Attending Physician: Oval Linsey, MD  Discharge Diagnosis: 1. Hypertensive urgency  Active Problems:   Hypertensive urgency   Cocaine abuse   Marijuana abuse   Morbid obesity ([place])   Discharge Medications: Allergies as of 10/18/2016      Reactions   Tape Itching, Rash   "took skin off"      Medication List    STOP taking these medications   carvedilol 6.25 MG tablet Commonly known as:  COREG   hydrALAZINE 100 MG tablet Commonly known as:  APRESOLINE     TAKE these medications   acetaminophen 325 MG tablet Commonly known as:  TYLENOL Take 2 tablets (650 mg total) by mouth every 6 (six) hours as needed for mild pain (or Fever >/= 101).   amLODipine 10 MG tablet Commonly known as:  NORVASC Take 1 tablet (10 mg total) by mouth daily.   aspirin 81 MG EC tablet Take 1 tablet (81 mg total) by mouth daily.   Cariprazine HCl 4.5 MG Caps Commonly known as:  VRAYLAR Take 1 capsule (4.5 mg total) by mouth at bedtime.   cloNIDine 0.3 MG tablet Commonly known as:  CATAPRES Take 1 tablet (0.3 mg total) by mouth 3 (three) times daily.   docusate sodium 100 MG capsule Commonly known as:  COLACE Take 1 capsule (100 mg total) by mouth 2 (two) times daily.   famotidine 20 MG tablet Commonly known as:  PEPCID Take 1 tablet (20 mg total) by mouth daily.   feeding supplement (PRO-STAT SUGAR FREE 64) Liqd Take 30 mLs by mouth 2 (two) times daily.   gabapentin 100 MG capsule Commonly known as:  NEURONTIN Take 1 capsule (100 mg total) by mouth 3 (three) times daily.   lisinopril-hydrochlorothiazide 20-12.5 MG tablet Commonly known as:  ZESTORETIC Take 2 tablets by mouth daily.   multivitamin with minerals Tabs tablet Take 1 tablet by mouth daily.   nitroGLYCERIN 0.4 MG SL tablet Commonly known as:   NITROSTAT Place 1 tablet (0.4 mg total) under the tongue every 5 (five) minutes as needed for chest pain.   ondansetron 4 MG tablet Commonly known as:  ZOFRAN Take 1 tablet (4 mg total) by mouth every 6 (six) hours as needed for nausea.   oxyCODONE-acetaminophen 5-325 MG tablet Commonly known as:  PERCOCET/ROXICET Take 1 tablet by mouth 2 (two) times daily as needed for severe pain.   PARoxetine 20 MG tablet Commonly known as:  PAXIL Take 1 tablet (20 mg total) by mouth daily.   silver sulfADIAZINE 1 % cream Commonly known as:  SILVADENE Apply 1 application topically daily.   spironolactone 50 MG tablet Commonly known as:  ALDACTONE Take 1 tablet (50 mg total) by mouth daily.   traMADol 50 MG tablet Commonly known as:  ULTRAM Take 1 tablet (50 mg total) by mouth every 6 (six) hours as needed. What changed:  reasons to take this            Discharge Care Instructions        Start     Ordered   10/19/16 0000  amLODipine (NORVASC) 10 MG tablet  Daily     10/18/16 1601   10/19/16 0000  spironolactone (ALDACTONE) 50 MG tablet  Daily     10/18/16 1601   10/18/16 0000  cloNIDine (CATAPRES) 0.3 MG tablet  3 times daily     10/18/16 1601   10/18/16 0000  gabapentin (NEURONTIN) 100 MG capsule  3 times daily     10/18/16 1601   10/18/16 0000  lisinopril-hydrochlorothiazide (ZESTORETIC) 20-12.5 MG tablet  Daily     10/18/16 1601   10/18/16 0000  Increase activity slowly     10/18/16 1601   10/18/16 0000  Diet - low sodium heart healthy     10/18/16 1601   10/18/16 0000  Call MD for:  temperature >100.4     10/18/16 1601   10/18/16 0000  Call MD for:  severe uncontrolled pain     10/18/16 1601   10/18/16 0000  Call MD for:  difficulty breathing, headache or visual disturbances     10/18/16 1601   10/18/16 0000  Call MD for:  persistant dizziness or light-headedness     10/18/16 1601   10/18/16 0000  Call MD for:  extreme fatigue     10/18/16 1601   10/18/16 0000  Call  MD for:  persistant nausea and vomiting     10/18/16 1601      Disposition and follow-up:   KellyKelly Novak was discharged from [facility] in Stable condition.  At the hospital follow up visit please address:  1.  Hypertensive urgency. We changed her home medications from clonidine, hydralazine, and carvedilol to clonidine, amlodipine, spironolactone, and lisinopril-HCTZ given her social situation, medication non-compliance, and continued cocaine use. She has had multiple presentations for hypertensive urgency due to lack of access to clonidine. Please attempt to wean the clonidine if possible to prevent future episodes of rebound hypertension.  We also chose lisinopril-HCTZ because it is a once daily pill that is on the $4 medication list. We added spironolactone for HTN and HFrEF benefit. Has she been able to afford and take these pills? How is her BP?  Due to persistently elevated BP despite 5 anti-hypertensives, we ordered aldo:renin ratio for concern for hyperaldosteronism, which is pending at the time of discharge.  2.  Labs / imaging needed at time of follow-up: BP  3.  Pending labs/ test needing follow-up: Aldo:renin ratio  Follow-up Appointments: Follow-up Information    Kelly Pier, MD. Schedule an appointment as soon as possible for a visit in 1 week(s).   Specialty:  Internal Medicine Contact information: [address] [place] [address] [phone]           Hospital Course by problem list: Active Problems:   Hypertensive urgency   Cocaine abuse   Marijuana abuse   Morbid obesity ([place])   1. Hypertensive urgency, in setting of running out of anti-hypertensive medications Kelly Novak presented on 8/22 with elevated blood pressures to the 200s/100s with associated chest pain and headache. EKG with pseudonormalization of the inferolateral T waves from prior EKG. She states that she ran out of her medications 2 days ago. Her home  medications include clonidine, hydralazine, and carvedilol. She also states that she last used cocaine 3 days prior to admission and marijuana the day prior to admission. UDS positive for cocaine, THC, and opiates (after administration of morphine in the ER).  Her BP remained persistently elevated despite oral and IV medications, and ultimately required transfer to the ICU for nitroprusside and nicardipene drips. Her chest pain and headache resolved with improvement in her BP. Her anti-hypertensive regimen was changed to clonidine 0.3mg  TID, amlodipine 10mg  qday, lisinopril-HCTZ 40-25mg  qday, and spironolactone 50mg  qday. We are hopeful to  eventually wean her off of the clonidine because she seems to have had several episodes where she either runs out of this medication or is not compliant. We would thus like to wean her off clonidine because of the risk of rebound hypertension and headaches. We discontinued carvedilol because of her ongoing cocaine use and discontinued hydralazine because it is a three times daily pill and may be difficult for her to take as prescribed. For her new medications, we chose lisinopril-HCTZ because this is a combination pill that is once daily and is on the $4 medication list. Lisinopril is also useful in the setting of HFrEF. We added spironolactone for HTN and HFrEF benefit as well. Due to her persistently elevated BP on 5 medications, we ordered aldo:renin ratio, still pending at discharge. No hyper/hypothyroidism.  Patient initially stated that the [facility] pharmacy is closed on weekends and that cost was an issue and her brother helps her pay for medications and he doesn't get paid until Wednesday. However, patient was seen with family the next day and daughter stated that she would go to the [facility] herself to pick up all of the patient's medications and that she would make sure the patient takes her medications as prescribed.  BP remained slightly elevated on  discharge, but patient was eager to go home and family stated that they would go to [facility] to [facility] all her meds and make sure she takes them. She was stable at discharge and was informed to follow up with PCP in 1 week.  2. Asymptomatic bacteriuria UA on admission with small leukocytes, 6-30 WBC, rare bacteria. Patient denies dysuria. Afebrile. No antibiotics were given for asymptomatic bacteriuria.  Discharge Vitals:   BP (!) 175/98   Pulse (!) 104   Temp 98.1 F (36.7 C) (Oral)   Resp 18   Ht 5\' 7"  (1.702 m)   Wt 278 lb 3.5 oz (126.2 kg)   LMP 08/06/2013   SpO2 94%   BMI 43.58 kg/m   Pertinent Labs, Studies, and Procedures:  CBC Latest Ref Rng & Units 10/16/2016 10/15/2016 10/13/2016  WBC 4.0 - 10.5 K/uL 6.8 10.7(H) 8.8  Hemoglobin 12.0 - 15.0 g/dL 10.4(L) 10.6(L) 10.5(L)  Hematocrit 36.0 - 46.0 % 33.4(L) 34.0(L) 34.1(L)  Platelets 150 - 400 K/uL 320 362 348   BMP Latest Ref Rng & Units 10/18/2016 10/17/2016 10/16/2016  Glucose 65 - 99 mg/dL 95 143(H) 105(H)  BUN 6 - 20 mg/dL 22(H) 21(H) 14  Creatinine 0.44 - 1.00 mg/dL 0.81 0.85 0.86  Sodium 135 - 145 mmol/L 138 138 139  Potassium 3.5 - 5.1 mmol/L 4.1 3.2(L) 3.9  Chloride 101 - 111 mmol/L 104 103 108  CO2 22 - 32 mmol/L 23 23 23   Calcium 8.9 - 10.3 mg/dL 8.9 8.9 8.9   BNP 183 Trop neg x3 (peaked at 0.07) UA with 30 protein, small leukocytes, neg nitrites, WBC 6-30, rare bacteria UDS positive for opiates, cocaine, and THC (after morphine administration in ED) A1c 5.3 Mg 2.0 P 5.2 (high) TSH 0.305 (low), free T4 1.03 (nl) Aldo:renin ratio pending at time of discharge  Discharge Instructions: Discharge Instructions    Call MD for:  difficulty breathing, headache or visual disturbances    Complete by:  As directed    Call MD for:  extreme fatigue    Complete by:  As directed    Call MD for:  persistant dizziness or light-headedness    Complete by:  As directed  Call MD for:  persistant nausea and vomiting    Complete  by:  As directed    Call MD for:  severe uncontrolled pain    Complete by:  As directed    Call MD for:  temperature >100.4    Complete by:  As directed    Diet - low sodium heart healthy    Complete by:  As directed    Increase activity slowly    Complete by:  As directed      Signed: Colbert Ewing, MD 10/19/2016, 8:43 AM   Pager: Mamie Nick [phone]

## 2016-10-16 NOTE — Progress Notes (Signed)
Pt arrived onto unit. A&Ox4. Complains of right leg pain rates it 8/10. Pt instructed to call for help when needed and verbalizes understanding. Family present at the bedside.     Skin assessment done by Carolynne Edouard, RN and Dana Allan, SWOT RN
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
## 2016-10-16 NOTE — Progress Notes (Signed)
PT Cancellation Note  Patient Details Name: Kelly Novak MRN: [id number] DOB: [birth year]   Cancelled Treatment:    Reason Eval/Treat Not Completed: Fatigue/lethargy limiting ability to participate;Patient declined, no reason specified. Pt sound asleep and not arousing to verbal stimuli. PT will continue to f/u with pt as available.    [place] 10/16/2016, 5:34 PM

## 2016-10-16 NOTE — Care Management Note (Signed)
Case Management Note  Patient Details  Name: Kelly Novak MRN: [id number] Date of Birth: 1970/06/02  Subjective/Objective:  Pt admitted with hypertention emergency                  Action/Plan:  Pt alert and oriented during assessment.  Pt states she is independent from home with family.  She has BKA but is able to transfer to wheelchair at baseline.  Pt states she is active with [facility] and gets medications filled at pharmacy.  CM will continue to follow for discharge needs   Expected Discharge Date:                  Expected Discharge Plan:     In-House Referral:     Discharge planning Services  CM Consult  Post Acute Care Choice:    Choice offered to:     DME Arranged:    DME Agency:     HH Arranged:    HH Agency:     Status of Service:     If discussed at [employer], dates discussed:    Additional Comments:  Maryclare Labrador, RN 10/16/2016, 12:00 PM

## 2016-10-16 NOTE — Progress Notes (Signed)
CSW consulted for substance abuse resources. CSW went to speak with pt at bedside to complete assessment. Pt mentioned that pt did not want to speak to CSW at this time. CSW will try again to speak with pt.   Virgie Dad [place], MSW, [place] [facility] Clinical Social Worker [phone]   .

## 2016-10-17 LAB — BASIC METABOLIC PANEL
Anion gap: 12 (ref 5–15)
BUN: 21 mg/dL — AB (ref 6–20)
CHLORIDE: 103 mmol/L (ref 101–111)
CO2: 23 mmol/L (ref 22–32)
CREATININE: 0.85 mg/dL (ref 0.44–1.00)
Calcium: 8.9 mg/dL (ref 8.9–10.3)
GFR calc Af Amer: >60 mL/min (ref >60)
GFR calc non Af Amer: >60 mL/min (ref >60)
GLUCOSE: 143 mg/dL — AB (ref 65–99)
Potassium: 3.2 mmol/L — ABNORMAL LOW (ref 3.5–5.1)
SODIUM: 138 mmol/L (ref 135–145)

## 2016-10-17 MED ORDER — POTASSIUM CHLORIDE CRYS ER 20 MEQ PO TBCR
40.0000 meq | EXTENDED_RELEASE_TABLET | Freq: Once | ORAL | Status: AC
  Administered 2016-10-17: 40 meq via ORAL
  Filled 2016-10-17: qty 2

## 2016-10-17 MED ORDER — HYDRALAZINE HCL 20 MG/ML IJ SOLN
5.0000 mg | INTRAMUSCULAR | Status: DC | PRN
  Administered 2016-10-17 – 2016-10-18 (×2): 5 mg via INTRAVENOUS
  Filled 2016-10-17 (×4): qty 1

## 2016-10-17 MED ORDER — SPIRONOLACTONE 25 MG PO TABS
25.0000 mg | ORAL_TABLET | Freq: Every day | ORAL | Status: DC
  Administered 2016-10-17: 25 mg via ORAL
  Filled 2016-10-17 (×2): qty 1

## 2016-10-17 MED ORDER — SPIRONOLACTONE 25 MG PO TABS: 25.0000 mg | ORAL_TABLET | Freq: Every day | ORAL | Status: DC

## 2016-10-17 MED ORDER — OXYCODONE-ACETAMINOPHEN 5-325 MG PO TABS
1.0000 | ORAL_TABLET | Freq: Once | ORAL | Status: AC
  Administered 2016-10-17: 1 via ORAL
  Filled 2016-10-17: qty 1

## 2016-10-17 MED ORDER — GABAPENTIN 100 MG PO CAPS
100.0000 mg | ORAL_CAPSULE | Freq: Three times a day (TID) | ORAL | Status: DC
  Administered 2016-10-17 – 2016-10-18 (×5): 100 mg via ORAL
  Filled 2016-10-17 (×5): qty 1

## 2016-10-17 NOTE — Progress Notes (Signed)
Pt BP this morning was 184/102 and having 10/10 pain. Tramadol PRN given and PRN hydralazine given for high blood pressure. Will pass off to dayshift RN that BP needs to be rechecked after medication has had enough time to work.

## 2016-10-17 NOTE — Progress Notes (Addendum)
Subjective:  Kelly Novak was very agitated and restless this morning. She was sitting up in bed unable to sit still. She reports not feeling well today. She says that her whole body hurts. Pain appears to be worst in her right leg AKA stump. She was unable to describe the pain very well but did eventually say it felt sharp. Denied the pain localizing to any one point. No trauma. She denies any headaches, SOB, CP today. She does say that her biggest barrier to obtaining her medications outpatient is due to costs. Reports her son helps her pay for her medications and does not get paid until Wednesday. She does follow with [facility] and their pharmacy is not open over the weekend.   Objective:  Vital signs in last 24 hours: Vitals:   10/16/16 2154 10/17/16 0602 10/17/16 0657 10/17/16 0913  BP: (!) 152/70 (!) 170/109 (!) 184/102 (!) 176/89  Pulse: 90 (!) 104  (!) 120  Resp: 18 18  19   Temp: 99.2 F (37.3 C) 98.1 F (36.7 C)  98.5 F (36.9 C)  TempSrc: Oral Oral  Oral  SpO2: 97% 96%  100%  Weight:      Height:       GEN: Obese female, sitting up in bed. Appears agitated and restless. Unable to sit still for very long.  HEENT: Pupils dilated, reactive to light.  LUNGS: CTAB, no wheezing or rales. HEART: Tachycardic, regular rhythm. No m/r/g.  ABDOMEN: soft, non-tender, BS+ EXTREMITIES: Right AKA. Right thigh larger than left. No erythema, warmth or rashes. No point tenderness but does complain of pain diffusely across the thigh both anteriorly and posteriorly.   Assessment/Plan:  Uncontrolled Hypertensive: BP trended up throughout the day yesterday with BP in the 902-409 range systolic yesterday afternoon and this morning. She was started on Lisinopril 40 mg, HCTZ 25 mg and amlodipine 10 mg yesterday. We did start weaning her clonidine does from 0.3 mg TID to 0.2 mg TID yesterday as well. Given her difficult to control hypertension despite being on 4 antihypertensives  (3 max dose) as well as mild hypokalemia will check Renin/Aldo ratio to assess for hyperaldosteronism, though this test may be affected by the recent initiation of an ACE-I. Will also add spironolactone 25 mg daily to her regimen after drawing labs. Will try to avoid beta blockers given her cocaine abuse. Continue Hydralazine 5 mg IV prn for SBP > 180. Will consult case management to help assist with medication needs at discharge.   Right thigh pain: Likely neuropathic in nature. There does not appear to be any signs of infection on exam. She does remain intermittently tachycardic and appears very agitated on exam. Pupils remain dilated today. Her UDS was positive for cocaine, opioids (recieved morphine in ED prior to UDS and has various scripts for oxycodone and tramadol from ED visits and hospitalization in July), and [facility]. Symptoms not consistent with cocaine withdrawal. She has been getting opioids since admission so do not believe this to be opioid withdrawal. Unclear what is causing her agitation and dilated pupils. May be related to uncontrolled pain in her right leg AKA site which is most likely neuropathic. Will continue treatment with Tylenol, Toradol & Tramadol prn. Will start gabapentin 100 mg TID trial. Encourage to work with PT.   Asymptomatic bacteriuria Monitor for symptoms. Hold antibiotics unless symptomatic.   # HFrEF (LVEF 45-50% in 10/2015) - hold home carvedilol due to recent cocaine use - continue lisinopril 40mg  qday  #  Depression - Continue home paroxetine  Dispo: Anticipated discharge in approximately 1-2 day(s).   Maryellen Pile, MD 10/17/2016, 10:08 AM Pager: [phone]

## 2016-10-17 NOTE — Evaluation (Signed)
Physical Therapy Evaluation Patient Details Name: Kelly Novak MRN: [id number] DOB: 07-29-1970 Today's Date: 10/17/2016   History of Present Illness  Patient is a 46 y/o female admitted through ED on 10/14/16 with a headache and chest pain and was diagnosed with a hypertensive emergency. 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  Clinical Impression  Pt presents with the above diagnosis for therapy evaluation. Prior to admission, pt was living with her son in a single level apartment. Pt's son has been assisting with all ADLs and IADLs as needed. Pt is at baseline level of functioning since admission. Pt is able to perform bed mobs and transfers with Mod I. No further acute PT needs required at this time. PT will sign off. Please re-order if any new needs arise.     Follow Up Recommendations No PT follow up    Equipment Recommendations  None recommended by PT    Recommendations for Other Services       Precautions / Restrictions Precautions Precautions: None Restrictions Weight Bearing Restrictions: No RLE Weight Bearing: Partial weight bearing      Mobility  Bed Mobility Overal bed mobility: Independent                Transfers Overall transfer level: Modified independent               General transfer comment: able to stand pivot from bed to recliner without any assistance  Ambulation/Gait                Stairs            Wheelchair Mobility    Modified Rankin (Stroke Patients Only)       Balance Overall balance assessment: No apparent balance deficits (not formally assessed)                                           Pertinent Vitals/Pain Pain Assessment: No/denies pain    Home Living Family/patient expects to be discharged to:: Private residence Living Arrangements: Children Available Help at Discharge:  Family;Available PRN/intermittently Type of Home: Apartment Home Access: Level entry     Home Layout: One level Home Equipment: Walker - standard;Crutches;Bedside commode;Wheelchair - manual      Prior Function Level of Independence: Independent with assistive device(s);Needs assistance   Gait / Transfers Assistance Needed: uses WC for mobility and transfers independently   ADL's / Homemaking Assistance Needed: Son assists with showers and meal prep along with performing most IADLs        Hand Dominance   Dominant Hand: Right    Extremity/Trunk Assessment   Upper Extremity Assessment Upper Extremity Assessment: Overall WFL for tasks assessed    Lower Extremity Assessment Lower Extremity Assessment: RLE deficits/detail RLE Deficits / Details: Right AKA    Cervical / Trunk Assessment Cervical / Trunk Assessment: Normal  Communication   Communication: No difficulties  Cognition Arousal/Alertness: Awake/alert Behavior During Therapy: WFL for tasks assessed/performed Overall Cognitive Status: Within Functional Limits for tasks assessed                                        General Comments      Exercises     Assessment/Plan  PT Assessment Patent does not need any further PT services  PT Problem List         PT Treatment Interventions      PT Goals (Current goals can be found in the Care Plan section)  Acute Rehab PT Goals Patient Stated Goal: to get home PT Goal Formulation: All assessment and education complete, DC therapy    Frequency     Barriers to discharge        Co-evaluation               AM-PAC PT "6 Clicks" Daily Activity  Outcome Measure Difficulty turning over in bed (including adjusting bedclothes, sheets and blankets)?: None Difficulty moving from lying on back to sitting on the side of the bed? : None Difficulty sitting down on and standing up from a chair with arms (e.g., wheelchair, bedside commode,  etc,.)?: Unable Help needed moving to and from a bed to chair (including a wheelchair)?: None Help needed walking in hospital room?: Total Help needed climbing 3-5 steps with a railing? : Total 6 Click Score: 15    End of Session Equipment Utilized During Treatment: Gait belt Activity Tolerance: Patient tolerated treatment well Patient left: in chair;with call bell/phone within reach;with chair alarm set Nurse Communication: Mobility status PT Visit Diagnosis: Muscle weakness (generalized) (M62.81)    Time: 2423-5361 PT Time Calculation (min) (ACUTE ONLY): 8 min   Charges:   PT Evaluation $PT Eval Moderate Complexity: 1 Mod     PT G Codes:        Scheryl Marten PT, DPT  [phone]   Jacqulyn Liner Sloan Leiter 10/17/2016, 1:35 PM

## 2016-10-18 LAB — BASIC METABOLIC PANEL
Anion gap: 11 (ref 5–15)
BUN: 22 mg/dL — AB (ref 6–20)
CALCIUM: 8.9 mg/dL (ref 8.9–10.3)
CO2: 23 mmol/L (ref 22–32)
Chloride: 104 mmol/L (ref 101–111)
Creatinine, Ser: 0.81 mg/dL (ref 0.44–1.00)
GFR calc Af Amer: >60 mL/min (ref >60)
Glucose, Bld: 95 mg/dL (ref 65–99)
POTASSIUM: 4.1 mmol/L (ref 3.5–5.1)
SODIUM: 138 mmol/L (ref 135–145)

## 2016-10-18 LAB — TSH: TSH: 0.305 u[IU]/mL — ABNORMAL LOW (ref 0.350–4.500)

## 2016-10-18 LAB — T4, FREE: Free T4: 1.03 ng/dL (ref 0.61–1.12)

## 2016-10-18 MED ORDER — CLONIDINE HCL 0.3 MG PO TABS: 0.3000 mg | ORAL_TABLET | Freq: Three times a day (TID) | ORAL | 0 refills | Status: DC

## 2016-10-18 MED ORDER — AMLODIPINE BESYLATE 10 MG PO TABS: 10.0000 mg | ORAL_TABLET | Freq: Every day | ORAL | 1 refills | Status: DC

## 2016-10-18 MED ORDER — CLONIDINE HCL 0.1 MG PO TABS
0.1000 mg | ORAL_TABLET | Freq: Once | ORAL | Status: AC
  Administered 2016-10-18: 0.1 mg via ORAL
  Filled 2016-10-18: qty 1

## 2016-10-18 MED ORDER — GABAPENTIN 100 MG PO CAPS: 100.0000 mg | ORAL_CAPSULE | Freq: Three times a day (TID) | ORAL | 0 refills | Status: DC

## 2016-10-18 MED ORDER — LISINOPRIL-HYDROCHLOROTHIAZIDE 20-12.5 MG PO TABS: 2.0000 | ORAL_TABLET | Freq: Every day | ORAL | 1 refills | Status: DC

## 2016-10-18 MED ORDER — SPIRONOLACTONE 25 MG PO TABS
50.0000 mg | ORAL_TABLET | Freq: Every day | ORAL | Status: DC
  Administered 2016-10-18: 50 mg via ORAL
  Filled 2016-10-18: qty 2

## 2016-10-18 MED ORDER — SPIRONOLACTONE 50 MG PO TABS: 50.0000 mg | ORAL_TABLET | Freq: Every day | ORAL | 1 refills | Status: DC

## 2016-10-18 MED ORDER — CLONIDINE HCL 0.2 MG PO TABS
0.3000 mg | ORAL_TABLET | Freq: Three times a day (TID) | ORAL | Status: DC
  Administered 2016-10-18: 0.3 mg via ORAL
  Filled 2016-10-18: qty 1

## 2016-10-18 MED ORDER — WHITE PETROLATUM GEL
Status: AC
  Administered 2016-10-18: 14:00:00
  Filled 2016-10-18: qty 1

## 2016-10-18 NOTE — Progress Notes (Signed)
Late entry for 0950 At Dr.'s request security, administrative coodinator and I went into patient's room. I explained that the Dr. Was concerned that there me be an illicit drug in room and asked if security could search the room. She said she didn't care. She kept saying "shit if you find some give it to me" and repeated she couldn't believe her doctor thought she would use drugs in the hospital. Nothing found during search. Expressed to her the concern of her B/P remaining high and thanked her for allowing [place] to search the room.

## 2016-10-18 NOTE — Progress Notes (Signed)
   Subjective:  Kelly Novak was very restless this morning. She states that her whole body hurts and that her right leg AKA stump is sore and tender. She continues to ask for pain medication. Received percocet x1 last night for pain. Denies headaches, trouble breathing, or CP.   Objective:  Vital signs in last 24 hours: Vitals:   10/17/16 2104 10/17/16 2220 10/18/16 0529 10/18/16 0614  BP: (!) 181/93 (!) 166/94 (!) 198/89 (!) 195/88  Pulse: (!) 103 99 (!) 106 98  Resp: 18 18 18    Temp: 98.7 F (37.1 C)  98.1 F (36.7 C)   TempSrc: Oral  Oral   SpO2: 99% 100% 95% 94%  Weight:   278 lb 3.5 oz (126.2 kg)   Height:       GEN: Obese female, lying in bed. Appears agitated and restless. Unable to be still. HEENT: Pupils 83mm, reactive to light.  LUNGS: CTAB, no wheezing or rales. HEART: Tachycardic, regular rhythm. No m/r/g. ABDOMEN: soft, non-tender, positive BS EXTREMITIES: Right AKA. Right thigh larger than left. No erythema, warmth or rashes. No point tenderness but does complain of pain diffusely across the thigh both anteriorly and posteriorly.  Assessment/Plan:  Uncontrolled Hypertension: BP trending up in 190s/80s. HR 90s-100s [ ]  f/u aldo:renin ratio, for hyperaldosteronism given her difficult to control HTN despite being on 5 antihypertensives [ ]  f/u TSH - continue amlodipine 10mg  daily - continue lisinopril 40mg  daily - continue HCTZ 25mg  daily - increase to clonidine 0.3mg  TID - increase to spironolactone 50mg  daily - hydralazine 5mg  IV PRN for SBP >180 - case management consulted to assist with medication needs at discharge; appreciate their help  Right thigh pain: Likely neuropathic in nature. No signs of infection on exam. Unclear what is causing her agitation and intermittent tachycardia. May be related to uncontrolled pain in her right leg AKA site. Patient did receive script for percocet 20 tablets BID as needed from Ortho, to be filled 8/19. - Tylenol, Toradol,  and Tramadol PRN for pain - Cont gabapentin 100mg  TID; will consider increasing if she continues to have pain - Encourage working with PT  # HFrEF (LVEF 45-50% in 10/2015) - hold home carvedilol due to recent cocaine use - continue lisinopril 40mg  qday - cont spironolactone 50mg  daily  # Depression - Continue home paroxetine  Dispo: Anticipated discharge in approximately 1-2 day(s).   Colbert Ewing, MD  Internal Medicine, PGY-1 10/18/2016, 8:12 AM Pager: [phone]

## 2016-10-18 NOTE — Discharge Instructions (Addendum)
Kelly Novak,  Please take your blood pressure medicines as prescribed. You are taking the following medicines: - Spironolactone 50mg  once a day - Lisinopril-HCTZ 40-25mg  once a day - Clonidine 0.3mg  three times a day - Amlodipine 10mg  once a day  Please call your PCP, Dr. Karle Plumber at [facility] to schedule an appointment as soon as possible. You have an appointment on September 25 with her but I would like for you to see her sooner regarding your blood pressure.

## 2016-10-18 NOTE — Care Management Note (Signed)
Case Management Note  Patient Details  Name: Kelly Novak MRN: [id number] Date of Birth: [birth year]  Subjective/Objective: CM received consult for assist with Medications. Pt not eligible as she has received assistance in this program within the past 12 months.                    Action/Plan:CM will sign off for now but will be available should additional discharge needs arise or disposition change.    Expected Discharge Date:                  Expected Discharge Plan:     In-House Referral:     Discharge planning Services  CM Consult, [place] Program, Medication Assistance  Post Acute Care Choice:    Choice offered to:     DME Arranged:    DME Agency:     HH Arranged:    HH Agency:     Status of Service:  Completed, signed off  If discussed at [employer] of Stay Meetings, dates discussed:    Additional Comments:  Delrae Sawyers, RN 10/18/2016, 3:49 PM

## 2016-10-18 NOTE — Progress Notes (Signed)
Patient discharge teaching given, including activity, diet, follow-up appoints, and medications. Patient verbalized understanding of all discharge instructions. IV access was d/c'd. Vitals are stable. Skin is intact except as charted in most recent assessments. Pt to be escorted out by NT, to be driven home by family. 

## 2016-10-18 NOTE — Progress Notes (Signed)
Patient's BP was 198/89 this morning and pain was 10/10. PRN hydralazine 5 mg given and pain medications given; BP rechecked at 195/88 and pain still rated at 10/10.  MD, Helberg, J. Notified and verbal order to RN to give scheduled clonidine 0.2 mg early.  No new orders for additional pain medications at this time.  Per MD BP needs to be controlled before pain medication ordered.  Will administer medication and pass on to day RN that BP needs to be rechecked.

## 2016-10-21 LAB — ALDOSTERONE + RENIN ACTIVITY W/ RATIO
ALDO / PRA RATIO: 9.1 (ref 0.0–30.0)
Aldosterone: 6.2 ng/dL (ref 0.0–30.0)
PRA LC/MS/MS: 0.679 ng/mL/h (ref 0.167–5.380)

## 2016-10-31 ENCOUNTER — Other Ambulatory Visit: Payer: Self-pay | Admitting: Internal Medicine

## 2016-10-31 DIAGNOSIS — I10 Essential (primary) hypertension: Secondary | ICD-10-CM

## 2016-11-02 ENCOUNTER — Telehealth: Payer: Self-pay | Admitting: General Practice

## 2016-11-02 DIAGNOSIS — I10 Essential (primary) hypertension: Secondary | ICD-10-CM

## 2016-11-02 MED ORDER — LISINOPRIL-HYDROCHLOROTHIAZIDE 20-12.5 MG PO TABS: 2.0000 | ORAL_TABLET | Freq: Every day | ORAL | 0 refills | Status: DC

## 2016-11-02 MED ORDER — CLONIDINE HCL 0.3 MG PO TABS: 0.3000 mg | ORAL_TABLET | Freq: Three times a day (TID) | ORAL | 0 refills | Status: DC

## 2016-11-02 MED ORDER — AMLODIPINE BESYLATE 10 MG PO TABS: 10.0000 mg | ORAL_TABLET | Freq: Every day | ORAL | 0 refills | Status: DC

## 2016-11-02 NOTE — Telephone Encounter (Signed)
Refilled x 30 days, patient must have office visit

## 2016-11-02 NOTE — Telephone Encounter (Signed)
Patient called requesting medication refill on her hypertension medication, pt's son called stating that someone has taken the medication and pt is completely out. Please f/up

## 2016-11-02 NOTE — Addendum Note (Signed)
Addended by: Rica Mast on: 11/02/2016 11:37 AM   Modules accepted: Orders

## 2016-11-09 ENCOUNTER — Other Ambulatory Visit: Payer: Self-pay | Admitting: Internal Medicine

## 2016-11-09 ENCOUNTER — Other Ambulatory Visit: Payer: Self-pay | Admitting: Physician Assistant

## 2016-11-09 ENCOUNTER — Telehealth: Payer: Self-pay | Admitting: Internal Medicine

## 2016-11-09 DIAGNOSIS — I10 Essential (primary) hypertension: Secondary | ICD-10-CM

## 2016-11-09 NOTE — Telephone Encounter (Signed)
Attempted to call son but was unable to leave VM.  Called patient's preferred number and left HIPAA-compliant message.  We refilled the script 11/02/16 for 30 day supply. Patient needs to pick it up from the pharmacy. Also requested that patient make appt for follow up to get refills in the future.

## 2016-11-09 NOTE — Telephone Encounter (Signed)
Medication Refill: Clonidine  Patient is completely out. Informed patient and patient's son that she was prescribed a 30 day supply on 9/10 and a refill is unlikely  Son Timmothy Sours) would like a call back at [phone]
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
## 2016-11-10 NOTE — Telephone Encounter (Signed)
Refilled x 10 days only - patient must have office visit.

## 2016-11-17 ENCOUNTER — Ambulatory Visit: Payer: Self-pay | Admitting: Internal Medicine

## 2016-11-26 ENCOUNTER — Other Ambulatory Visit: Payer: Self-pay | Admitting: Internal Medicine

## 2016-11-26 DIAGNOSIS — I10 Essential (primary) hypertension: Secondary | ICD-10-CM

## 2016-11-27 ENCOUNTER — Telehealth: Payer: Self-pay | Admitting: *Deleted

## 2016-11-27 NOTE — Telephone Encounter (Signed)
Patient son verified DOB Mother is very belligerent with the son regarding her medication. Patient son called the office to request a refill on clonidine. MA informed son of patient no showing to 3 appointments even after receiving a courtesy refill. MA spoke with the clinical pharmacist and new assigned PCP and both denied the request without having an office visit first. MA informed son of amlodipine and lisinopril HCTZ being at the pharmacy and the need for the patient to take all three blood pressure medications as advised. Son states he would pick up the two BP medications from [facility] and take them to his mother.

## 2016-12-03 ENCOUNTER — Ambulatory Visit (INDEPENDENT_AMBULATORY_CARE_PROVIDER_SITE_OTHER): Payer: 59 | Admitting: Orthopedic Surgery

## 2016-12-07 ENCOUNTER — Ambulatory Visit (INDEPENDENT_AMBULATORY_CARE_PROVIDER_SITE_OTHER): Payer: 59 | Admitting: Orthopedic Surgery

## 2016-12-07 DIAGNOSIS — Z89611 Acquired absence of right leg above knee: Secondary | ICD-10-CM

## 2016-12-07 MED ORDER — TRAMADOL HCL 50 MG PO TABS: 50.0000 mg | ORAL_TABLET | Freq: Two times a day (BID) | ORAL | 0 refills | Status: DC

## 2016-12-07 NOTE — Progress Notes (Signed)
Post-Op Visit Note   Patient: Kelly Novak           Date of Birth: Jan 27, 1971           MRN: [id number] Visit Date: 12/07/2016 PCP: Patient, No Pcp Per  Chief Complaint:  Chief Complaint  Patient presents with  . Right Leg - Follow-up    HPI:  HPI The patient is a 46 year old woman seen status post right above-the-knee amputation on July 13. She is concerned about some retained staples. Has yet to get her prosthesis made due to these staples. Complaining of worse residulal limb pain today.  Wears shrinker "most days." Ortho Exam Incision is well healed. There are 3 remaining staples. Residual limb not entirely consolidated.   Visit Diagnoses:  1. S/P AKA (above knee amputation) unilateral, right (HCC)     Plan: staples removed. Follow with prosthetist for fabrication of prosthesis. Follow up in office as needed.  Follow-Up Instructions: Return if symptoms worsen or fail to improve.   Imaging: No results found.  Orders:  No orders of the defined types were placed in this encounter.  Meds ordered this encounter  Medications  . traMADol (ULTRAM) 50 MG tablet    Sig: Take 1 tablet (50 mg total) by mouth 2 (two) times daily.    Dispense:  14 tablet    Refill:  0     PMFS History: Patient Active Problem List   Diagnosis Date Noted  . Cocaine abuse ([place])   . Marijuana abuse   . Morbid obesity ([place])   . Hypertensive urgency 10/14/2016  . S/P AKA (above knee amputation) unilateral, right ([place]) 09/17/2016  . Bipolar disorder ([place]) 08/31/2016  . History of stroke 08/07/2016  . Polysubstance abuse ([place])   . Chronic systolic (congestive) heart failure ([place])   . Depression 11/03/2015  . GERD (gastroesophageal reflux disease) 11/03/2015  . Chronic back pain   . Uterine fibroids 08/10/2013  . ASCUS pap with negative HRHPV 08/10/2013  . Pes planus (flat feet) 09/02/2012  . Anemia 09/02/2012  . Smoker 09/02/2012   Past Medical History:  Diagnosis Date  . Anemia     . Anxiety   . Arthritis   . Bipolar 1 disorder ([place])   . Blood dyscrasia    sickle cell trait  . Chronic back pain   . Chronic systolic (congestive) heart failure ([place])   . GERD (gastroesophageal reflux disease)   . Hypertension   . Obesity   . Polysubstance abuse   . Shortness of breath    on exertion due to weight  . Stroke [facility])     Family History  Problem Relation Age of Onset  . CAD Father   . Heart failure Brother   . Hypertension Brother   . CAD Paternal Aunt     Past Surgical History:  Procedure Laterality Date  . AMPUTATION Right 09/04/2016   Procedure: Right Above Knee Amputation;  Surgeon: Newt Minion, MD;  Location: [place];  Service: Orthopedics;  Laterality: Right;  . CESAREAN SECTION    . CHOLECYSTECTOMY    . HYSTEROSCOPY N/A 08/23/2013   Procedure: HYSTEROSCOPY D&C WITH HYDROTHERMAL ABLATION;  Surgeon: Osborne Oman, MD;  Location: [facility];  Service: Gynecology;  Laterality: N/A;  . I&D EXTREMITY Right 09/02/2016   Procedure: IRRIGATION AND DEBRIDEMENT EXTREMITY;  Surgeon: Newt Minion, MD;  Location: [place];  Service: Orthopedics;  Laterality: Right;  . TUBAL LIGATION     Social History  Occupational History  . Not on file.   Social History Main Topics  . Smoking status: Current Every Day Smoker    Packs/day: 0.50    Years: 14.00    Types: Cigarettes  . Smokeless tobacco: Never Used  . Alcohol use No  . Drug use: Yes    Types: Cocaine     Comment: last use 10/11/16  . Sexual activity: No

## 2016-12-24 ENCOUNTER — Other Ambulatory Visit: Payer: Self-pay | Admitting: Internal Medicine

## 2016-12-25 ENCOUNTER — Ambulatory Visit: Payer: Medicare Other | Admitting: Internal Medicine

## 2016-12-28 ENCOUNTER — Other Ambulatory Visit: Payer: Self-pay | Admitting: Internal Medicine

## 2016-12-28 DIAGNOSIS — I10 Essential (primary) hypertension: Secondary | ICD-10-CM

## 2016-12-30 ENCOUNTER — Emergency Department (HOSPITAL_COMMUNITY)
Admission: EM | Admit: 2016-12-30 | Discharge: 2016-12-30 | Disposition: A | Discharge status: Home / Self Care | Payer: Medicare Other | Attending: Emergency Medicine | Admitting: Emergency Medicine

## 2016-12-30 ENCOUNTER — Encounter (HOSPITAL_COMMUNITY): Payer: Self-pay | Admitting: *Deleted

## 2016-12-30 ENCOUNTER — Other Ambulatory Visit: Payer: Self-pay

## 2016-12-30 ENCOUNTER — Emergency Department (HOSPITAL_COMMUNITY): Payer: Medicare Other

## 2016-12-30 DIAGNOSIS — I110 Hypertensive heart disease with heart failure: Secondary | ICD-10-CM | POA: Diagnosis not present

## 2016-12-30 DIAGNOSIS — Z79899 Other long term (current) drug therapy: Secondary | ICD-10-CM | POA: Diagnosis not present

## 2016-12-30 DIAGNOSIS — Z760 Encounter for issue of repeat prescription: Secondary | ICD-10-CM | POA: Diagnosis not present

## 2016-12-30 DIAGNOSIS — I5022 Chronic systolic (congestive) heart failure: Secondary | ICD-10-CM | POA: Diagnosis not present

## 2016-12-30 DIAGNOSIS — F17210 Nicotine dependence, cigarettes, uncomplicated: Secondary | ICD-10-CM | POA: Diagnosis not present

## 2016-12-30 DIAGNOSIS — I10 Essential (primary) hypertension: Secondary | ICD-10-CM

## 2016-12-30 DIAGNOSIS — Z9114 Patient's other noncompliance with medication regimen: Secondary | ICD-10-CM | POA: Insufficient documentation

## 2016-12-30 DIAGNOSIS — R030 Elevated blood-pressure reading, without diagnosis of hypertension: Secondary | ICD-10-CM | POA: Diagnosis present

## 2016-12-30 LAB — BASIC METABOLIC PANEL WITH GFR
Anion gap: 6 (ref 5–15)
BUN: 6 mg/dL (ref 6–20)
CO2: 28 mmol/L (ref 22–32)
Calcium: 8.9 mg/dL (ref 8.9–10.3)
Chloride: 105 mmol/L (ref 101–111)
Creatinine, Ser: 0.75 mg/dL (ref 0.44–1.00)
GFR calc Af Amer: >60 mL/min
GFR calc non Af Amer: >60 mL/min
Glucose, Bld: 111 mg/dL — ABNORMAL HIGH (ref 65–99)
Potassium: 3 mmol/L — ABNORMAL LOW (ref 3.5–5.1)
Sodium: 139 mmol/L (ref 135–145)

## 2016-12-30 LAB — CBC
HCT: 37.5 % (ref 36.0–46.0)
Hemoglobin: 11.3 g/dL — ABNORMAL LOW (ref 12.0–15.0)
MCH: 18.4 pg — ABNORMAL LOW (ref 26.0–34.0)
MCHC: 30.1 g/dL (ref 30.0–36.0)
MCV: 61.2 fL — ABNORMAL LOW (ref 78.0–100.0)
Platelets: 348 10*3/uL (ref 150–400)
RBC: 6.13 MIL/uL — ABNORMAL HIGH (ref 3.87–5.11)
RDW: 15.6 % — ABNORMAL HIGH (ref 11.5–15.5)
WBC: 9.5 10*3/uL (ref 4.0–10.5)

## 2016-12-30 MED ORDER — CLONIDINE HCL 0.2 MG PO TABS
0.3000 mg | ORAL_TABLET | Freq: Once | ORAL | Status: AC
  Administered 2016-12-30: 17:00:00 0.3 mg via ORAL

## 2016-12-30 MED ORDER — CLONIDINE HCL 0.3 MG PO TABS: 0.3000 mg | ORAL_TABLET | Freq: Three times a day (TID) | ORAL | 1 refills | Status: DC

## 2016-12-30 MED ORDER — CLONIDINE HCL 0.2 MG PO TABS
0.3000 mg | ORAL_TABLET | Freq: Once | ORAL | Status: AC
  Administered 2016-12-30: 0.3 mg via ORAL
  Filled 2016-12-30: qty 1

## 2016-12-30 MED ORDER — POTASSIUM CHLORIDE CRYS ER 20 MEQ PO TBCR
40.0000 meq | EXTENDED_RELEASE_TABLET | Freq: Once | ORAL | Status: AC
  Administered 2016-12-30: 40 meq via ORAL
  Filled 2016-12-30: qty 2

## 2016-12-30 NOTE — ED Triage Notes (Signed)
The pt arrived by [facility] from a clinic she was there to get her bp med refilled  No bp meds for 2 days.  High bp went there for med refill  She was given clonidine at the clinic.  Alert no distress  rtg ak amp in July this past year

## 2016-12-30 NOTE — ED Notes (Signed)
Clonidine ordered for pt's high bp
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
## 2016-12-30 NOTE — Discharge Instructions (Signed)
Refill for your clonidine 0.3 mg medication.  Take 3 times a day.  Follow-up with your primary care doctor.

## 2017-01-04 NOTE — Progress Notes (Deleted)
Patient ID: Kelly Novak, female   DOB: September 14, 1970, 46 y.o.   MRN: [id number] After being seen in the ED 12/30/2016 for BP concerns after running out of clonidine.  EKG was unchanged. K+ was 3.0.

## 2017-01-05 ENCOUNTER — Ambulatory Visit: Payer: Self-pay

## 2017-02-05 ENCOUNTER — Encounter (HOSPITAL_COMMUNITY): Payer: Self-pay | Admitting: Emergency Medicine

## 2017-02-05 ENCOUNTER — Emergency Department (HOSPITAL_COMMUNITY): Payer: Medicare Other

## 2017-02-05 ENCOUNTER — Inpatient Hospital Stay (HOSPITAL_COMMUNITY)
Admission: EM | Admit: 2017-02-05 | Discharge: 2017-02-08 | DRG: 305 | Disposition: A | Discharge status: Home Health | Payer: Medicare Other | Attending: Nephrology | Admitting: Nephrology

## 2017-02-05 DIAGNOSIS — I110 Hypertensive heart disease with heart failure: Secondary | ICD-10-CM | POA: Diagnosis present

## 2017-02-05 DIAGNOSIS — R110 Nausea: Secondary | ICD-10-CM

## 2017-02-05 DIAGNOSIS — R519 Headache, unspecified: Secondary | ICD-10-CM

## 2017-02-05 DIAGNOSIS — I10 Essential (primary) hypertension: Secondary | ICD-10-CM | POA: Diagnosis present

## 2017-02-05 DIAGNOSIS — E669 Obesity, unspecified: Secondary | ICD-10-CM | POA: Diagnosis present

## 2017-02-05 DIAGNOSIS — K219 Gastro-esophageal reflux disease without esophagitis: Secondary | ICD-10-CM | POA: Diagnosis present

## 2017-02-05 DIAGNOSIS — N179 Acute kidney failure, unspecified: Secondary | ICD-10-CM | POA: Diagnosis present

## 2017-02-05 DIAGNOSIS — R51 Headache: Secondary | ICD-10-CM | POA: Diagnosis not present

## 2017-02-05 DIAGNOSIS — Z8673 Personal history of transient ischemic attack (TIA), and cerebral infarction without residual deficits: Secondary | ICD-10-CM

## 2017-02-05 DIAGNOSIS — F1910 Other psychoactive substance abuse, uncomplicated: Secondary | ICD-10-CM | POA: Diagnosis present

## 2017-02-05 DIAGNOSIS — Z89611 Acquired absence of right leg above knee: Secondary | ICD-10-CM

## 2017-02-05 DIAGNOSIS — Z6841 Body Mass Index (BMI) 40.0 and over, adult: Secondary | ICD-10-CM

## 2017-02-05 DIAGNOSIS — D573 Sickle-cell trait: Secondary | ICD-10-CM | POA: Diagnosis present

## 2017-02-05 DIAGNOSIS — R109 Unspecified abdominal pain: Secondary | ICD-10-CM

## 2017-02-05 DIAGNOSIS — R0602 Shortness of breath: Secondary | ICD-10-CM

## 2017-02-05 DIAGNOSIS — D72829 Elevated white blood cell count, unspecified: Secondary | ICD-10-CM | POA: Diagnosis present

## 2017-02-05 DIAGNOSIS — F319 Bipolar disorder, unspecified: Secondary | ICD-10-CM | POA: Diagnosis present

## 2017-02-05 DIAGNOSIS — F419 Anxiety disorder, unspecified: Secondary | ICD-10-CM | POA: Diagnosis present

## 2017-02-05 DIAGNOSIS — E876 Hypokalemia: Secondary | ICD-10-CM | POA: Diagnosis present

## 2017-02-05 DIAGNOSIS — I160 Hypertensive urgency: Secondary | ICD-10-CM | POA: Diagnosis not present

## 2017-02-05 DIAGNOSIS — Z9114 Patient's other noncompliance with medication regimen: Secondary | ICD-10-CM

## 2017-02-05 DIAGNOSIS — I5022 Chronic systolic (congestive) heart failure: Secondary | ICD-10-CM | POA: Diagnosis present

## 2017-02-05 DIAGNOSIS — F1110 Opioid abuse, uncomplicated: Secondary | ICD-10-CM | POA: Diagnosis present

## 2017-02-05 DIAGNOSIS — F17210 Nicotine dependence, cigarettes, uncomplicated: Secondary | ICD-10-CM | POA: Diagnosis present

## 2017-02-05 DIAGNOSIS — Z79899 Other long term (current) drug therapy: Secondary | ICD-10-CM

## 2017-02-05 LAB — COMPREHENSIVE METABOLIC PANEL
ALT: 14 U/L (ref 14–54)
AST: 23 U/L (ref 15–41)
Albumin: 4 g/dL (ref 3.5–5.0)
Alkaline Phosphatase: 102 U/L (ref 38–126)
Anion gap: 12 (ref 5–15)
BUN: 7 mg/dL (ref 6–20)
CO2: 23 mmol/L (ref 22–32)
Calcium: 9.5 mg/dL (ref 8.9–10.3)
Chloride: 99 mmol/L — ABNORMAL LOW (ref 101–111)
Creatinine, Ser: 0.79 mg/dL (ref 0.44–1.00)
GFR calc Af Amer: >60 mL/min (ref >60)
GFR calc non Af Amer: >60 mL/min (ref >60)
Glucose, Bld: 124 mg/dL — ABNORMAL HIGH (ref 65–99)
Potassium: 3.6 mmol/L (ref 3.5–5.1)
Sodium: 134 mmol/L — ABNORMAL LOW (ref 135–145)
Total Bilirubin: 1.1 mg/dL (ref 0.3–1.2)
Total Protein: 7.8 g/dL (ref 6.5–8.1)

## 2017-02-05 LAB — CBC WITH DIFFERENTIAL/PLATELET
Basophils Absolute: 0 10*3/uL (ref 0.0–0.1)
Basophils Relative: 0 %
Eosinophils Absolute: 0.3 10*3/uL (ref 0.0–0.7)
Eosinophils Relative: 2 %
HCT: 43.5 % (ref 36.0–46.0)
Hemoglobin: 14.1 g/dL (ref 12.0–15.0)
Lymphocytes Relative: 15 %
Lymphs Abs: 2 10*3/uL (ref 0.7–4.0)
MCH: 18.9 pg — ABNORMAL LOW (ref 26.0–34.0)
MCHC: 32.4 g/dL (ref 30.0–36.0)
MCV: 58.2 fL — ABNORMAL LOW (ref 78.0–100.0)
Monocytes Absolute: 0.4 10*3/uL (ref 0.1–1.0)
Monocytes Relative: 3 %
Neutro Abs: 10.7 10*3/uL — ABNORMAL HIGH (ref 1.7–7.7)
Neutrophils Relative %: 80 %
Platelets: 457 10*3/uL — ABNORMAL HIGH (ref 150–400)
RBC: 7.47 MIL/uL — ABNORMAL HIGH (ref 3.87–5.11)
RDW: 17.4 % — ABNORMAL HIGH (ref 11.5–15.5)
WBC: 13.4 10*3/uL — ABNORMAL HIGH (ref 4.0–10.5)

## 2017-02-05 LAB — INFLUENZA PANEL BY PCR (TYPE A & B)
Influenza A By PCR: NEGATIVE
Influenza B By PCR: NEGATIVE

## 2017-02-05 LAB — I-STAT TROPONIN, ED: Troponin i, poc: 0.01 ng/mL (ref 0.00–0.08)

## 2017-02-05 LAB — I-STAT BETA HCG BLOOD, ED (MC, WL, AP ONLY): I-stat hCG, quantitative: <5 m[IU]/mL (ref <5)

## 2017-02-05 LAB — BRAIN NATRIURETIC PEPTIDE: B Natriuretic Peptide: 379.9 pg/mL — ABNORMAL HIGH (ref 0.0–100.0)

## 2017-02-05 MED ORDER — LISINOPRIL 20 MG PO TABS: 20.0000 mg | ORAL_TABLET | Freq: Every day | ORAL | Status: DC

## 2017-02-05 MED ORDER — LISINOPRIL-HYDROCHLOROTHIAZIDE 20-12.5 MG PO TABS: 2.0000 | ORAL_TABLET | Freq: Every day | ORAL | Status: DC

## 2017-02-05 MED ORDER — CLONIDINE HCL 0.2 MG PO TABS
0.2000 mg | ORAL_TABLET | Freq: Once | ORAL | Status: AC
  Administered 2017-02-05: 0.2 mg via ORAL
  Filled 2017-02-05: qty 1

## 2017-02-05 MED ORDER — AMLODIPINE BESYLATE 5 MG PO TABS
10.0000 mg | ORAL_TABLET | Freq: Every day | ORAL | Status: DC
  Administered 2017-02-05: 10 mg via ORAL
  Filled 2017-02-05: qty 2

## 2017-02-05 MED ORDER — ENOXAPARIN SODIUM 40 MG/0.4ML ~~LOC~~ SOLN
40.0000 mg | SUBCUTANEOUS | Status: DC
  Administered 2017-02-06 – 2017-02-08 (×3): 40 mg via SUBCUTANEOUS
  Filled 2017-02-05 (×4): qty 0.4

## 2017-02-05 MED ORDER — ONDANSETRON HCL 4 MG/2ML IJ SOLN
4.0000 mg | Freq: Four times a day (QID) | INTRAMUSCULAR | Status: DC | PRN
  Administered 2017-02-06: 4 mg via INTRAVENOUS
  Filled 2017-02-05: qty 2

## 2017-02-05 MED ORDER — CARVEDILOL 25 MG PO TABS
25.0000 mg | ORAL_TABLET | Freq: Two times a day (BID) | ORAL | Status: DC
  Administered 2017-02-06 – 2017-02-08 (×4): 25 mg via ORAL
  Filled 2017-02-05 (×4): qty 1

## 2017-02-05 MED ORDER — MORPHINE SULFATE (PF) 4 MG/ML IV SOLN
2.0000 mg | Freq: Once | INTRAVENOUS | Status: DC
Start: 2017-02-05 — End: 2017-02-05

## 2017-02-05 MED ORDER — LABETALOL HCL 5 MG/ML IV SOLN
20.0000 mg | Freq: Once | INTRAVENOUS | Status: AC
  Administered 2017-02-05: 20 mg via INTRAVENOUS
  Filled 2017-02-05: qty 4

## 2017-02-05 MED ORDER — HYDRALAZINE HCL 50 MG PO TABS
100.0000 mg | ORAL_TABLET | Freq: Three times a day (TID) | ORAL | Status: DC
  Administered 2017-02-05 – 2017-02-08 (×6): 100 mg via ORAL
  Filled 2017-02-05: qty 4
  Filled 2017-02-05 (×5): qty 2
  Filled 2017-02-05: qty 4

## 2017-02-05 MED ORDER — HYDROCHLOROTHIAZIDE 12.5 MG PO CAPS: 12.5000 mg | ORAL_CAPSULE | Freq: Every day | ORAL | Status: DC

## 2017-02-05 MED ORDER — HYDRALAZINE HCL 20 MG/ML IJ SOLN
20.0000 mg | Freq: Once | INTRAMUSCULAR | Status: AC
  Administered 2017-02-05: 20 mg via INTRAVENOUS
  Filled 2017-02-05: qty 1

## 2017-02-05 MED ORDER — CLONIDINE HCL 0.2 MG PO TABS
0.3000 mg | ORAL_TABLET | Freq: Three times a day (TID) | ORAL | Status: DC
  Administered 2017-02-06 (×2): 0.3 mg via ORAL
  Filled 2017-02-05 (×3): qty 1

## 2017-02-05 MED ORDER — ONDANSETRON HCL 4 MG PO TABS: 4.0000 mg | ORAL_TABLET | Freq: Four times a day (QID) | ORAL | Status: DC | PRN

## 2017-02-05 MED ORDER — FUROSEMIDE 10 MG/ML IJ SOLN
60.0000 mg | Freq: Once | INTRAMUSCULAR | Status: AC
  Administered 2017-02-05: 60 mg via INTRAVENOUS
  Filled 2017-02-05: qty 6

## 2017-02-05 MED ORDER — MORPHINE SULFATE (PF) 4 MG/ML IV SOLN
4.0000 mg | Freq: Once | INTRAVENOUS | Status: AC
  Administered 2017-02-05: 4 mg via INTRAVENOUS
  Filled 2017-02-05: qty 1

## 2017-02-05 MED ORDER — ACETAMINOPHEN 325 MG PO TABS
650.0000 mg | ORAL_TABLET | Freq: Four times a day (QID) | ORAL | Status: DC | PRN
  Administered 2017-02-06 (×2): 650 mg via ORAL
  Filled 2017-02-05 (×3): qty 2

## 2017-02-05 MED ORDER — CLONIDINE HCL 0.1 MG PO TABS
0.3000 mg | ORAL_TABLET | Freq: Once | ORAL | Status: AC
  Administered 2017-02-05: 16:00:00 0.3 mg via ORAL
  Filled 2017-02-05: qty 1

## 2017-02-05 MED ORDER — ACETAMINOPHEN 650 MG RE SUPP: 650.0000 mg | Freq: Four times a day (QID) | RECTAL | Status: DC | PRN

## 2017-02-05 MED ORDER — LISINOPRIL 20 MG PO TABS
20.0000 mg | ORAL_TABLET | Freq: Every day | ORAL | Status: DC
  Administered 2017-02-05 – 2017-02-06 (×2): 20 mg via ORAL
  Filled 2017-02-05 (×2): qty 1

## 2017-02-05 MED ORDER — HYDROCHLOROTHIAZIDE 25 MG PO TABS
25.0000 mg | ORAL_TABLET | Freq: Every day | ORAL | Status: DC
  Administered 2017-02-05 – 2017-02-06 (×2): 25 mg via ORAL
  Filled 2017-02-05 (×2): qty 1

## 2017-02-05 NOTE — ED Notes (Signed)
Pt aware of need for urine  

## 2017-02-05 NOTE — ED Notes (Signed)
Dr. Gardner at bedside 

## 2017-02-05 NOTE — ED Triage Notes (Signed)
Started  Bad h/a yesterday and it cont today states has not taken her bp but feels that  Is high

## 2017-02-05 NOTE — ED Notes (Signed)
Pt stated that she is unable to give a urine sample at this time.

## 2017-02-05 NOTE — ED Notes (Signed)
Pt requesting pain medications, provider aware

## 2017-02-05 NOTE — Plan of Care (Addendum)
Asked to evaluate patient for admission for hypertensive urgency.  Patient apparently took all of her home BP meds today: hydralazine, metoprolol, carvedilol, lisinopril-HCTZ at 7am.  Ran out of Clonidine 2 days ago.  She denies cocaine use in last 3 weeks (although has a history of this).  Has been having frontal headaches over last 2 days.  Usually gets these headaches with HTN she says.  In ED: initial BP 209/142.  Despite 3 rounds of 20mg  IV labetalol, 20mg  IV hydralazine, and 0.3mg  PO clonidine.  Although BP temporarily responds to meds, when I went to admit patient BP was 211/141 still.  Headache persists.  Have recommended that EDP consider starting cardene gtt and calling PCCM for admit.  Alternatively they can call us back when BPs can be controlled with SDU level interventions.

## 2017-02-05 NOTE — H&P (Addendum)
History and Physical    Kelly Novak EUM:[id number] DOB: 09-07-1970 DOA: 02/05/2017  PCP: Patient, No Pcp Per  Patient coming from: Home  I have personally briefly reviewed patient's old medical records in [place]  Chief Complaint: HTN  HPI: Kelly Novak is a 46 y.o. female with medical history significant of Malignant hypertension, prior polysubstance abuse including cocaine.  She denies cocaine use in last 3 weeks.  Has been having frontal headaches over last 2 days.  Usually gets these headaches with HTN she says.  Patient apparently took all of her home BP meds today: hydralazine, metoprolol, carvedilol, lisinopril-HCTZ at 7am.  Ran out of Clonidine 2 days ago.   ED Course: Initial BP 209/142.  Despite 3 rounds of 20mg  IV labetalol, 20mg  IV hydralazine, and 0.3mg  PO clonidine.  Although BP temporarily responds to meds, when I went to admit patient BP was 211/141 still.  Headache persists.  CT head neg for ICH or other acute abnormality.   Review of Systems: As per HPI otherwise 10 point review of systems negative.   Past Medical History:  Diagnosis Date  . Anemia   . Anxiety   . Arthritis   . Bipolar 1 disorder ([place])   . Blood dyscrasia    sickle cell trait  . Chronic back pain   . Chronic systolic (congestive) heart failure ([place])   . GERD (gastroesophageal reflux disease)   . Hypertension   . Obesity   . Polysubstance abuse ([place])   . Shortness of breath    on exertion due to weight  . Stroke [facility])     Past Surgical History:  Procedure Laterality Date  . AMPUTATION Right 09/04/2016   Procedure: Right Above Knee Amputation;  Surgeon: Newt Minion, MD;  Location: [facility];  Service: Orthopedics;  Laterality: Right;  . CESAREAN SECTION    . CHOLECYSTECTOMY    . HYSTEROSCOPY N/A 08/23/2013   Procedure: HYSTEROSCOPY D&C WITH HYDROTHERMAL ABLATION;  Surgeon: Osborne Oman, MD;  Location: [facility];  Service: Gynecology;  Laterality: N/A;  . I&D EXTREMITY  Right 09/02/2016   Procedure: IRRIGATION AND DEBRIDEMENT EXTREMITY;  Surgeon: Newt Minion, MD;  Location: [facility];  Service: Orthopedics;  Laterality: Right;  . TUBAL LIGATION       reports that she has been smoking cigarettes.  She has a 7.00 pack-year smoking history. she has never used smokeless tobacco. She reports that she uses drugs. Drug: Cocaine. She reports that she does not drink alcohol.  Allergies  Allergen Reactions  . Tape Itching and Rash    "took skin off" please use wrap if possible    Family History  Problem Relation Age of Onset  . CAD Father   . Heart failure Brother   . Hypertension Brother   . CAD Paternal Aunt      Prior to Admission medications   Medication Sig Start Date End Date Taking? Authorizing Provider  Aspirin-Salicylamide-Caffeine (BC HEADACHE POWDER PO) Take 1 packet by mouth 2 (two) times daily as needed (pain/headache).   Yes [provider]  carvedilol (COREG) 25 MG tablet Take 25 mg by mouth 2 (two) times daily with a meal.   Yes [provider]  cloNIDine (CATAPRES) 0.3 MG tablet Take 1 tablet (0.3 mg total) 3 (three) times daily by mouth. 12/30/16  Yes Nat Christen, MD  diphenhydrAMINE (BENADRYL) 25 mg capsule Take 50 mg by mouth at bedtime.    Yes [provider]  hydrALAZINE (APRESOLINE)  10 MG tablet Take 10 mg by mouth every 8 (eight) hours.   Yes [provider]  lisinopril-hydrochlorothiazide (PRINZIDE,ZESTORETIC) 20-25 MG tablet Take 1 tablet by mouth daily.   Yes [provider]  Menthol, Topical Analgesic, (BENGAY EX) Apply 1 application topically 2 (two) times daily as needed (joint pain).   Yes [provider]  metoprolol succinate (TOPROL-XL) 100 MG 24 hr tablet Take 100 mg by mouth daily. Take with or immediately following a meal.   Yes [provider]  ranitidine (ZANTAC) 150 MG tablet Take 150 mg by mouth 2 (two) times daily before a meal.    Yes [provider]    amLODipine (NORVASC) 10 MG tablet Take 1 tablet (10 mg total) by mouth daily. Patient not taking: Reported on 12/30/2016 11/02/16   Tresa Garter, MD  Cariprazine HCl (VRAYLAR) 4.5 MG CAPS Take 1 capsule (4.5 mg total) by mouth at bedtime. Patient not taking: Reported on 12/30/2016 08/09/16   Mariel Aloe, MD  lisinopril-hydrochlorothiazide (ZESTORETIC) 20-12.5 MG tablet Take 2 tablets by mouth daily. Patient not taking: Reported on 12/30/2016 11/02/16   Tresa Garter, MD  nitroGLYCERIN (NITROSTAT) 0.4 MG SL tablet Place 1 tablet (0.4 mg total) under the tongue every 5 (five) minutes as needed for chest pain. Patient not taking: Reported on 12/30/2016 11/05/15   Florencia Reasons, MD    Physical Exam: Vitals:   02/05/17 2100 02/05/17 2115 02/05/17 2130 02/05/17 2145  BP: (!) 189/118 (!) 211/141 (!) 181/117 (!) 179/110  Pulse: 92 95 (!) 101 99  Resp: 16 (!) 21 (!) 28 19  Temp:      TempSrc:      SpO2: 97% 96% 95% 94%    Constitutional: NAD, calm, comfortable Eyes: PERRL, lids and conjunctivae normal ENMT: Mucous membranes are moist. Posterior pharynx clear of any exudate or lesions.Normal dentition.  Neck: normal, supple, no masses, no thyromegaly Respiratory: clear to auscultation bilaterally, no wheezing, no crackles. Normal respiratory effort. No accessory muscle use.  Cardiovascular: Regular rate and rhythm, no murmurs / rubs / gallops. No extremity edema. 2+ pedal pulses. No carotid bruits.  Abdomen: no tenderness, no masses palpated. No hepatosplenomegaly. Bowel sounds positive.  Musculoskeletal: no clubbing / cyanosis. No joint deformity upper and lower extremities. Good ROM, no contractures. Normal muscle tone.  Skin: no rashes, lesions, ulcers. No induration Neurologic: CN 2-12 grossly intact. Sensation intact, DTR normal. Strength 5/5 in all 4.  Psychiatric: Normal judgment and insight. Alert and oriented x 3. Normal mood.    Labs on Admission: I have personally reviewed  following labs and imaging studies  CBC: Recent Labs  Lab 02/05/17 1147  WBC 13.4*  NEUTROABS 10.7*  HGB 14.1  HCT 43.5  MCV 58.2*  PLT 789*   Basic Metabolic Panel: Recent Labs  Lab 02/05/17 1147  NA 134*  K 3.6  CL 99*  CO2 23  GLUCOSE 124*  BUN 7  CREATININE 0.79  CALCIUM 9.5   GFR: CrCl cannot be calculated (Unknown ideal weight.). Liver Function Tests: Recent Labs  Lab 02/05/17 1147  AST 23  ALT 14  ALKPHOS 102  BILITOT 1.1  PROT 7.8  ALBUMIN 4.0   No results for input(s): LIPASE, AMYLASE in the last 168 hours. No results for input(s): AMMONIA in the last 168 hours. Coagulation Profile: No results for input(s): INR, PROTIME in the last 168 hours. Cardiac Enzymes: No results for input(s): CKTOTAL, CKMB, CKMBINDEX, TROPONINI in the last 168 hours. BNP (last  3 results) No results for input(s): PROBNP in the last 8760 hours. HbA1C: No results for input(s): HGBA1C in the last 72 hours. CBG: No results for input(s): GLUCAP in the last 168 hours. Lipid Profile: No results for input(s): CHOL, HDL, LDLCALC, TRIG, CHOLHDL, LDLDIRECT in the last 72 hours. Thyroid Function Tests: No results for input(s): TSH, T4TOTAL, FREET4, T3FREE, THYROIDAB in the last 72 hours. Anemia Panel: No results for input(s): VITAMINB12, FOLATE, FERRITIN, TIBC, IRON, RETICCTPCT in the last 72 hours. Urine analysis:    Component Value Date/Time   COLORURINE YELLOW 10/14/2016 0750   APPEARANCEUR HAZY (A) 10/14/2016 0750   LABSPEC 1.019 10/14/2016 0750   PHURINE 6.0 10/14/2016 0750   GLUCOSEU NEGATIVE 10/14/2016 0750   HGBUR NEGATIVE 10/14/2016 0750   BILIRUBINUR NEGATIVE 10/14/2016 0750   KETONESUR NEGATIVE 10/14/2016 0750   PROTEINUR 30 (A) 10/14/2016 0750   UROBILINOGEN 0.2 06/06/2013 1815   NITRITE NEGATIVE 10/14/2016 0750   LEUKOCYTESUR SMALL (A) 10/14/2016 0750    Radiological Exams on Admission: Dg Chest 2 View  Result Date: 02/05/2017 CLINICAL DATA:  Shortness of  breath and body aches. EXAM: CHEST  2 VIEW COMPARISON:  12/30/2016 FINDINGS: The cardiac silhouette, mediastinal and hilar contours are within normal limits given the AP projection. Slightly low lung volumes with mild vascular crowding and streaky basilar atelectasis but no infiltrates, effusions or edema. The bony thorax is intact. IMPRESSION: No acute cardiopulmonary findings. Electronically Signed   By: Marijo Sanes M.D.   On: 02/05/2017 17:22   Ct Head Wo Contrast  Result Date: 02/05/2017 CLINICAL DATA:  Generalized headache for 2 days EXAM: CT HEAD WITHOUT CONTRAST TECHNIQUE: Contiguous axial images were obtained from the base of the skull through the vertex without intravenous contrast. COMPARISON:  10/12/2016 FINDINGS: Brain: Stable left frontal periventricular white matter lacunar infarcts and pontine lacunar-type infarcts. No acute intracranial hemorrhage, mass lesion, new infarction, midline shift, herniation, hydrocephalus, or extra-axial fluid collection. No focal mass effect or edema. Cisterns are patent. No cerebellar abnormality. Vascular: No hyperdense vessel or unexpected calcification. Skull: Normal. Negative for fracture or focal lesion. Sinuses/Orbits: Scattered mild ethmoid, sphenoid and maxillary sinus disease. No orbital abnormality. Mastoids remain clear. Other: None. IMPRESSION: Stable chronic lacunar-type infarcts as above. No acute intracranial abnormality by noncontrast CT. Electronically Signed   By: Jerilynn Mages.  Shick M.D.   On: 02/05/2017 16:58    EKG: Independently reviewed.  Assessment/Plan Principal Problem:   Hypertensive urgency Active Problems:   Polysubstance abuse ([place])    1. HTN urgency - 1. Continue home BP meds of coreg, lisinopril-hctz, metoprolol, hydralazine with next doses tomorrow AM. 2. Resume Clonidine 0.3mg  TID 3. CT head neg for bleed 4. EDP spoke with PCCM, who declined to put patient on cardene gtt, instead recommended she get another 0.2mg  PO  clonidine and 60mg  IV lasix now. 1. She was also given her nighttime dose of 100mg  PO hydralazine 5. BP now controlled in the 767H-419F systolic range 6. Will hold off on putting in further BP meds for now other than her home meds starting in AM. 7. If BP spikes back up, then would start cardene gtt 2. H/o Cocaine abuse - UDS pending  DVT prophylaxis: Lovenox Code Status: Full Family Communication: Family at bedside Disposition Plan: Home after admit Consults called: EDP spoke with PCCM Admission status: Place in [place] for now   Caterra Ostroff, [facility] Pager [phone]  If 7AM-7PM, please contact day team taking care of patient [email or web address] Password [email or web address]  02/05/2017, 10:01 PM

## 2017-02-05 NOTE — ED Provider Notes (Signed)
[facility] EMERGENCY DEPARTMENT Provider Note   CSN: [id number] Arrival date & time: 02/05/17  1130     History   Chief Complaint Chief Complaint  Patient presents with  . Hypertension    HPI Ryna E Cureton is a 46 y.o. female with a history of hypertension, CVA, shortness of breath, polysubstance abuse, CHF who presents the emergency department today for elevated blood pressure.  Patient states that she has been off all blood pressure medications besides her clonidine since beginning of November due to difficulties with her insurance.  She recently was able to get her insurance again earlier this month and called her PCP who started her back on her hydralazine, metoprolol, carvedilol, lisinopril-HCTZ.  She has been taking these over the last 2 days, with last dose at 7 AM this morning.  She has not used her clonidine in the last 3-4 days.  Patient notes that over the last 2 days she has been having frontal HA she gets typically whenever her blood pressure is elevated.  She has also been having shortness of breath that is increased when laying down, and associated with a dry, nonproductive cough and body aches. Patient was seen here on 10/14/2016 and admitted for hypertension emergency presenting similarily.  She has a history of cocaine abuse but denies use in the last 3 weeks. Admits to recent marijuana use. Patient denies any fever, chills, chest pain, travel, hemoptysis, neck stiffness, thunderclap onset of headache or worst headache of life.  Notes similar to previous headaches when has elevated blood pressure. Did not receive flu vaccine this year.   HPI  Past Medical History:  Diagnosis Date  . Anemia   . Anxiety   . Arthritis   . Bipolar 1 disorder ([place])   . Blood dyscrasia    sickle cell trait  . Chronic back pain   . Chronic systolic (congestive) heart failure ([place])   . GERD (gastroesophageal reflux disease)   . Hypertension   . Obesity   . Polysubstance  abuse ([place])   . Shortness of breath    on exertion due to weight  . Stroke [facility])     Patient Active Problem List   Diagnosis Date Noted  . Cocaine abuse ([place])   . Marijuana abuse   . Morbid obesity ([place])   . Hypertensive urgency 10/14/2016  . S/P AKA (above knee amputation) unilateral, right ([place]) 09/17/2016  . Bipolar disorder ([place]) 08/31/2016  . History of stroke 08/07/2016  . Polysubstance abuse ([place])   . Chronic systolic (congestive) heart failure ([place])   . Depression 11/03/2015  . GERD (gastroesophageal reflux disease) 11/03/2015  . Chronic back pain   . Uterine fibroids 08/10/2013  . ASCUS pap with negative HRHPV 08/10/2013  . Pes planus (flat feet) 09/02/2012  . Anemia 09/02/2012  . Smoker 09/02/2012    Past Surgical History:  Procedure Laterality Date  . AMPUTATION Right 09/04/2016   Procedure: Right Above Knee Amputation;  Surgeon: Newt Minion, MD;  Location: [facility];  Service: Orthopedics;  Laterality: Right;  . CESAREAN SECTION    . CHOLECYSTECTOMY    . HYSTEROSCOPY N/A 08/23/2013   Procedure: HYSTEROSCOPY D&C WITH HYDROTHERMAL ABLATION;  Surgeon: Osborne Oman, MD;  Location: [facility];  Service: Gynecology;  Laterality: N/A;  . I&D EXTREMITY Right 09/02/2016   Procedure: IRRIGATION AND DEBRIDEMENT EXTREMITY;  Surgeon: Newt Minion, MD;  Location: [facility];  Service: Orthopedics;  Laterality: Right;  . TUBAL LIGATION  OB History    Gravida Para Term Preterm AB Living   4 4 4     2    SAB TAB Ectopic Multiple Live Births           4       Home Medications    Prior to Admission medications   Medication Sig Start Date End Date Taking? Authorizing Provider  acetaminophen (TYLENOL) 325 MG tablet Take 2 tablets (650 mg total) by mouth every 6 (six) hours as needed for mild pain (or Fever >/= 101). Patient not taking: Reported on 12/30/2016 09/10/16   Robbie Lis, MD  Amino Acids-Protein Hydrolys (FEEDING SUPPLEMENT, PRO-STAT SUGAR FREE 64,) LIQD Take 30  mLs by mouth 2 (two) times daily. Patient not taking: Reported on 12/30/2016 09/17/16   Argentina Donovan, PA-C  amLODipine (NORVASC) 10 MG tablet Take 1 tablet (10 mg total) by mouth daily. Patient not taking: Reported on 12/30/2016 11/02/16   Tresa Garter, MD  aspirin EC 81 MG EC tablet Take 1 tablet (81 mg total) by mouth daily. Patient not taking: Reported on 12/30/2016 11/05/15   Florencia Reasons, MD  Cariprazine HCl (VRAYLAR) 4.5 MG CAPS Take 1 capsule (4.5 mg total) by mouth at bedtime. Patient not taking: Reported on 12/30/2016 08/09/16   Mariel Aloe, MD  cloNIDine (CATAPRES) 0.3 MG tablet TAKE 1 TABLET 3 TIMES A DAY Patient taking differently: TAKE 0.3 mg TABLET 3 TIMES A DAY 11/10/16   Tresa Garter, MD  cloNIDine (CATAPRES) 0.3 MG tablet Take 1 tablet (0.3 mg total) 3 (three) times daily by mouth. 12/30/16   Nat Christen, MD  diphenhydrAMINE (BENADRYL) 25 mg capsule Take 25 mg every 6 (six) hours as needed by mouth for sleep.    [provider]  docusate sodium (COLACE) 100 MG capsule Take 1 capsule (100 mg total) by mouth 2 (two) times daily. Patient not taking: Reported on 12/30/2016 09/10/16   Robbie Lis, MD  famotidine (PEPCID) 20 MG tablet Take 1 tablet (20 mg total) by mouth daily. Patient not taking: Reported on 12/30/2016 09/17/16   Argentina Donovan, PA-C  gabapentin (NEURONTIN) 100 MG capsule Take 1 capsule (100 mg total) by mouth 3 (three) times daily. Patient not taking: Reported on 12/30/2016 10/18/16   Colbert Ewing, MD  lisinopril-hydrochlorothiazide (ZESTORETIC) 20-12.5 MG tablet Take 2 tablets by mouth daily. Patient not taking: Reported on 12/30/2016 11/02/16   Tresa Garter, MD  Multiple Vitamin (MULTIVITAMIN WITH MINERALS) TABS tablet Take 1 tablet by mouth daily. Patient not taking: Reported on 12/30/2016 09/11/16   Robbie Lis, MD  nitroGLYCERIN (NITROSTAT) 0.4 MG SL tablet Place 1 tablet (0.4 mg total) under the tongue every 5 (five) minutes as  needed for chest pain. Patient not taking: Reported on 12/30/2016 11/05/15   Florencia Reasons, MD  ondansetron (ZOFRAN) 4 MG tablet Take 1 tablet (4 mg total) by mouth every 6 (six) hours as needed for nausea. Patient not taking: Reported on 12/30/2016 09/17/16   Argentina Donovan, PA-C  PARoxetine (PAXIL) 20 MG tablet Take 1 tablet (20 mg total) by mouth daily. Patient not taking: Reported on 12/30/2016 08/09/16   Mariel Aloe, MD  ranitidine (ZANTAC) 150 MG tablet Take 150 mg as needed by mouth for heartburn.    [provider]  silver sulfADIAZINE (SILVADENE) 1 % cream Apply 1 application topically daily. Patient not taking: Reported on 12/30/2016 09/24/16   Suzan Slick, NP  spironolactone (ALDACTONE) 50 MG  tablet Take 1 tablet (50 mg total) by mouth daily. Patient not taking: Reported on 12/30/2016 10/19/16   Colbert Ewing, MD    Family History Family History  Problem Relation Age of Onset  . CAD Father   . Heart failure Brother   . Hypertension Brother   . CAD Paternal Aunt     Social History Social History   Tobacco Use  . Smoking status: Current Every Day Smoker    Packs/day: 0.50    Years: 14.00    Pack years: 7.00    Types: Cigarettes  . Smokeless tobacco: Never Used  Substance Use Topics  . Alcohol use: No  . Drug use: Yes    Types: Cocaine    Comment: last use 10/11/16     Allergies   Tape   Review of Systems Review of Systems  All other systems reviewed and are negative.    Physical Exam Updated Vital Signs BP (!) 209/142   Pulse 100   Temp 98.7 F (37.1 C) (Oral)   Resp 15   LMP 08/06/2013   SpO2 100%   Physical Exam  Constitutional: She appears well-developed and well-nourished.  HENT:  Head: Normocephalic and atraumatic.  Right Ear: External ear normal.  Left Ear: External ear normal.  Nose: Nose normal.  Mouth/Throat: Uvula is midline, oropharynx is clear and moist and mucous membranes are normal. No tonsillar exudate.  Eyes: Pupils  are equal, round, and reactive to light. Right eye exhibits no discharge. Left eye exhibits no discharge. No scleral icterus.  No papilledema  Neck: Trachea normal, normal range of motion and full passive range of motion without pain. Neck supple. No JVD present. No spinous process tenderness present. Carotid bruit is not present. No neck rigidity. Normal range of motion present.  No meningismus.   Cardiovascular: Normal rate, regular rhythm and intact distal pulses.  No murmur heard. Pulses:      Radial pulses are 2+ on the right side, and 2+ on the left side.       Dorsalis pedis pulses are 2+ on the left side.       Posterior tibial pulses are 2+ on the left side.  Right above-knee amputation.  No edema of the lower extremities.  Pulmonary/Chest: Effort normal and breath sounds normal. She exhibits no tenderness.  Abdominal: Soft. Bowel sounds are normal. There is no tenderness. There is no rebound and no guarding.  Musculoskeletal: She exhibits no edema.  Lymphadenopathy:    She has no cervical adenopathy.  Neurological: She is alert.  Speech clear. Follows commands. No facial droop. PERRLA. EOMI. Normal peripheral fields. CN III-XII intact.  Grossly moves all extremities 4 without ataxia (right AKI). Coordination intact. Able and appropriate strength for age to upper and lower extremities bilaterally including grip strength and hip flexion/extension. Sensation to light touch intact bilaterally for upper and lower ext. Normal finger to nose.  No pronator drift. Gait unable to be tested due to AKI on right.  Skin: Skin is warm and dry. No rash noted. She is not diaphoretic.  Psychiatric: She has a normal mood and affect.  Nursing note and vitals reviewed.    ED Treatments / Results  Labs (all labs ordered are listed, but only abnormal results are displayed) Labs Reviewed  CBC WITH DIFFERENTIAL/PLATELET - Abnormal; Notable for the following components:      Result Value   WBC 13.4  (*)    RBC 7.47 (*)    MCV 58.2 (*)  MCH 18.9 (*)    RDW 17.4 (*)    Platelets 457 (*)    Neutro Abs 10.7 (*)    All other components within normal limits  COMPREHENSIVE METABOLIC PANEL - Abnormal; Notable for the following components:   Sodium 134 (*)    Chloride 99 (*)    Glucose, Bld 124 (*)    All other components within normal limits  BRAIN NATRIURETIC PEPTIDE - Abnormal; Notable for the following components:   B Natriuretic Peptide 379.9 (*)    All other components within normal limits  INFLUENZA PANEL BY PCR (TYPE A & B)  URINALYSIS, ROUTINE W REFLEX MICROSCOPIC  RAPID URINE DRUG SCREEN, HOSP PERFORMED  I-STAT BETA HCG BLOOD, ED ([facility], [facility], [facility] ONLY)  I-STAT TROPONIN, ED    EKG  EKG Interpretation  Date/Time:  Friday February 05 2017 16:19:16 EST Ventricular Rate:  102 PR Interval:  170 QRS Duration: 88 QT Interval:  350 QTC Calculation: 456 R Axis:   69 Text Interpretation:  Sinus tachycardia Right atrial enlargement Consider left ventricular hypertrophy Borderline T abnormalities, inferior leads similar to previous EKg other than increased rate  Confirmed by Brantley Stage [phone]) on 02/05/2017 4:21:48 PM       Radiology Dg Chest 2 View  Result Date: 02/05/2017 CLINICAL DATA:  Shortness of breath and body aches. EXAM: CHEST  2 VIEW COMPARISON:  12/30/2016 FINDINGS: The cardiac silhouette, mediastinal and hilar contours are within normal limits given the AP projection. Slightly low lung volumes with mild vascular crowding and streaky basilar atelectasis but no infiltrates, effusions or edema. The bony thorax is intact. IMPRESSION: No acute cardiopulmonary findings. Electronically Signed   By: Marijo Sanes M.D.   On: 02/05/2017 17:22   Ct Head Wo Contrast  Result Date: 02/05/2017 CLINICAL DATA:  Generalized headache for 2 days EXAM: CT HEAD WITHOUT CONTRAST TECHNIQUE: Contiguous axial images were obtained from the base of the skull through the vertex without  intravenous contrast. COMPARISON:  10/12/2016 FINDINGS: Brain: Stable left frontal periventricular white matter lacunar infarcts and pontine lacunar-type infarcts. No acute intracranial hemorrhage, mass lesion, new infarction, midline shift, herniation, hydrocephalus, or extra-axial fluid collection. No focal mass effect or edema. Cisterns are patent. No cerebellar abnormality. Vascular: No hyperdense vessel or unexpected calcification. Skull: Normal. Negative for fracture or focal lesion. Sinuses/Orbits: Scattered mild ethmoid, sphenoid and maxillary sinus disease. No orbital abnormality. Mastoids remain clear. Other: None. IMPRESSION: Stable chronic lacunar-type infarcts as above. No acute intracranial abnormality by noncontrast CT. Electronically Signed   By: Jerilynn Mages.  Shick M.D.   On: 02/05/2017 16:58    Procedures Procedures (including critical care time)  Medications Ordered in ED Medications  lisinopril (PRINIVIL,ZESTRIL) tablet 20 mg (20 mg Oral Given 02/05/17 2024)    And  hydrochlorothiazide (HYDRODIURIL) tablet 25 mg (25 mg Oral Given 02/05/17 2024)  carvedilol (COREG) tablet 25 mg (not administered)  hydrALAZINE (APRESOLINE) tablet 100 mg (not administered)  cloNIDine (CATAPRES) tablet 0.2 mg (not administered)  furosemide (LASIX) injection 60 mg (not administered)  hydrALAZINE (APRESOLINE) injection 20 mg (20 mg Intravenous Given 02/05/17 1610)  cloNIDine (CATAPRES) tablet 0.3 mg (0.3 mg Oral Given 02/05/17 1610)  morphine 4 MG/ML injection 4 mg (4 mg Intravenous Given 02/05/17 1634)  labetalol (NORMODYNE,TRANDATE) injection 20 mg (20 mg Intravenous Given 02/05/17 1747)  labetalol (NORMODYNE,TRANDATE) injection 20 mg (20 mg Intravenous Given 02/05/17 1846)  labetalol (NORMODYNE,TRANDATE) injection 20 mg (20 mg Intravenous Given 02/05/17 1955)  morphine 4 MG/ML injection 4 mg (4 mg  Intravenous Given 02/05/17 2058)     Initial Impression / Assessment and Plan / ED Course  I have  reviewed the triage vital signs and the nursing notes.  Pertinent labs & imaging results that were available during my care of the patient were reviewed by me and considered in my medical decision making (see chart for details).     46 year old female with history of hypertension, CVA, CHF presenting to the emergency department today with elevated blood pressure and associated shortness of breath and frontal headache.  Patient has been living in the past for similar, with most recent episode on 10/14/2016.  Patient notes that due to insurance she has been out of her hydralazine, metoprolol, carvedilol, lisinopril and HCTZ since early November.  She is only been taking her clonidine daily.  Over the last 2 days she has been back on all medications except for her clonidine.  She notes for the last 2 days she has been having frontal headache and shortness of breath.  She denies any chest pain.  Patient is noted to have a blood pressure of 200/122 at the time that I am seeing the patient.  Patient took her home meds at 7 AM this morning.  Will order her home clonidine dosage and administer IV hydralazine that she is due for.  Patient is without papilledema.  Will obtain labs and imaging to rule out endorgan damage.  Labs and imaging showed no evidence of end organ damage.  CT head without any evidence of bleed or encephalopathy.  Troponin within normal limits.  No acute kidney injury.  No papilledema.  BNP slightly elevated in the 300s but patient is without evidence of fluid overload on exam and chest x-ray does not show any cardiomegaly or edema.  Labetalol 20 mg IV was given on 3 separate occasions without any significant decrease in patient's blood pressure.  She is still symptomatic with intermittent headaches.  Will admit for hypertension urgency for IV blood pressure medications.  Consult by Dr. Alcario Drought who advised that the patient would not be a good candidate for hospitalist admission.  He advised the  patient will require a IV drip and admission by critical care.  Will consult critical care.  Dr. Jimmy Footman of critical care return my call and advised the patient does not require IV drip for admission.  She advised giving the patient 0.2 mg of clonidine and 60 mg IV Lasix now and admitting to the hospitalist service.  She stated that if the hospitalist has any disagreement with this he can call for admission.  I spoke with Dr. Alcario Drought in regards to this in person.  He was made aware. Patient to be admitted.   Patient case discussed with Dr. Wilson Singer who is in agreement with plan.  Vitals:   02/05/17 2030 02/05/17 2100 02/05/17 2115 02/05/17 2130  BP: (!) 184/105 (!) 189/118 (!) 211/141 (!) 181/117  Pulse: 93 92 95 (!) 101  Resp: (!) 22 16 (!) 21 (!) 28  Temp:      TempSrc:      SpO2: 99% 97% 96% 95%     Final Clinical Impressions(s) / ED Diagnoses   Final diagnoses:  Hypertensive urgency  Bad headache  Shortness of breath    ED Discharge Orders    None       Lorelle Gibbs 02/05/17 2204    Virgel Manifold, MD 02/09/17 1146

## 2017-02-06 ENCOUNTER — Observation Stay (HOSPITAL_COMMUNITY): Payer: Medicare Other

## 2017-02-06 ENCOUNTER — Other Ambulatory Visit: Payer: Self-pay

## 2017-02-06 DIAGNOSIS — R109 Unspecified abdominal pain: Secondary | ICD-10-CM | POA: Diagnosis not present

## 2017-02-06 DIAGNOSIS — F319 Bipolar disorder, unspecified: Secondary | ICD-10-CM | POA: Diagnosis present

## 2017-02-06 DIAGNOSIS — F419 Anxiety disorder, unspecified: Secondary | ICD-10-CM | POA: Diagnosis present

## 2017-02-06 DIAGNOSIS — F1910 Other psychoactive substance abuse, uncomplicated: Secondary | ICD-10-CM | POA: Diagnosis not present

## 2017-02-06 DIAGNOSIS — R1032 Left lower quadrant pain: Secondary | ICD-10-CM | POA: Diagnosis not present

## 2017-02-06 DIAGNOSIS — R110 Nausea: Secondary | ICD-10-CM | POA: Diagnosis not present

## 2017-02-06 DIAGNOSIS — Z8673 Personal history of transient ischemic attack (TIA), and cerebral infarction without residual deficits: Secondary | ICD-10-CM | POA: Diagnosis not present

## 2017-02-06 DIAGNOSIS — I10 Essential (primary) hypertension: Secondary | ICD-10-CM | POA: Diagnosis present

## 2017-02-06 DIAGNOSIS — I110 Hypertensive heart disease with heart failure: Secondary | ICD-10-CM | POA: Diagnosis present

## 2017-02-06 DIAGNOSIS — Z9114 Patient's other noncompliance with medication regimen: Secondary | ICD-10-CM | POA: Diagnosis not present

## 2017-02-06 DIAGNOSIS — E669 Obesity, unspecified: Secondary | ICD-10-CM | POA: Diagnosis present

## 2017-02-06 DIAGNOSIS — Z6841 Body Mass Index (BMI) 40.0 and over, adult: Secondary | ICD-10-CM | POA: Diagnosis not present

## 2017-02-06 DIAGNOSIS — F1110 Opioid abuse, uncomplicated: Secondary | ICD-10-CM | POA: Diagnosis present

## 2017-02-06 DIAGNOSIS — N179 Acute kidney failure, unspecified: Secondary | ICD-10-CM | POA: Diagnosis not present

## 2017-02-06 DIAGNOSIS — Z89611 Acquired absence of right leg above knee: Secondary | ICD-10-CM | POA: Diagnosis not present

## 2017-02-06 DIAGNOSIS — K219 Gastro-esophageal reflux disease without esophagitis: Secondary | ICD-10-CM | POA: Diagnosis present

## 2017-02-06 DIAGNOSIS — D573 Sickle-cell trait: Secondary | ICD-10-CM | POA: Diagnosis present

## 2017-02-06 DIAGNOSIS — I160 Hypertensive urgency: Secondary | ICD-10-CM | POA: Diagnosis not present

## 2017-02-06 DIAGNOSIS — E876 Hypokalemia: Secondary | ICD-10-CM | POA: Diagnosis present

## 2017-02-06 DIAGNOSIS — R0602 Shortness of breath: Secondary | ICD-10-CM | POA: Diagnosis present

## 2017-02-06 DIAGNOSIS — Z79899 Other long term (current) drug therapy: Secondary | ICD-10-CM | POA: Diagnosis not present

## 2017-02-06 DIAGNOSIS — F17210 Nicotine dependence, cigarettes, uncomplicated: Secondary | ICD-10-CM | POA: Diagnosis present

## 2017-02-06 DIAGNOSIS — D72829 Elevated white blood cell count, unspecified: Secondary | ICD-10-CM | POA: Diagnosis present

## 2017-02-06 DIAGNOSIS — I5022 Chronic systolic (congestive) heart failure: Secondary | ICD-10-CM | POA: Diagnosis present

## 2017-02-06 LAB — URINALYSIS, ROUTINE W REFLEX MICROSCOPIC
Bilirubin Urine: NEGATIVE
Glucose, UA: NEGATIVE mg/dL
Hgb urine dipstick: NEGATIVE
Ketones, ur: NEGATIVE mg/dL
Leukocytes, UA: NEGATIVE
Nitrite: NEGATIVE
Protein, ur: NEGATIVE mg/dL
Specific Gravity, Urine: 1.009 (ref 1.005–1.030)
pH: 6 (ref 5.0–8.0)

## 2017-02-06 LAB — RAPID URINE DRUG SCREEN, HOSP PERFORMED
AMPHETAMINES: NOT DETECTED
Barbiturates: NOT DETECTED
Benzodiazepines: NOT DETECTED
Cocaine: NOT DETECTED
OPIATES: POSITIVE — AB
Tetrahydrocannabinol: NOT DETECTED

## 2017-02-06 LAB — CBC
HCT: 43.4 % (ref 36.0–46.0)
Hemoglobin: 14 g/dL (ref 12.0–15.0)
MCH: 18.7 pg — AB (ref 26.0–34.0)
MCHC: 32.3 g/dL (ref 30.0–36.0)
MCV: 58 fL — AB (ref 78.0–100.0)
PLATELETS: 455 10*3/uL — AB (ref 150–400)
RBC: 7.48 MIL/uL — AB (ref 3.87–5.11)
RDW: 16.9 % — ABNORMAL HIGH (ref 11.5–15.5)
WBC: 16.9 10*3/uL — ABNORMAL HIGH (ref 4.0–10.5)

## 2017-02-06 LAB — BASIC METABOLIC PANEL
Anion gap: 13 (ref 5–15)
BUN: 13 mg/dL (ref 6–20)
CHLORIDE: 99 mmol/L — AB (ref 101–111)
CO2: 24 mmol/L (ref 22–32)
Calcium: 9.3 mg/dL (ref 8.9–10.3)
Creatinine, Ser: 0.84 mg/dL (ref 0.44–1.00)
GFR calc Af Amer: >60 mL/min (ref >60)
GLUCOSE: 106 mg/dL — AB (ref 65–99)
POTASSIUM: 3.2 mmol/L — AB (ref 3.5–5.1)
Sodium: 136 mmol/L (ref 135–145)

## 2017-02-06 LAB — MRSA PCR SCREENING: MRSA by PCR: NEGATIVE

## 2017-02-06 LAB — LIPASE, BLOOD: LIPASE: 19 U/L (ref 11–51)

## 2017-02-06 LAB — MAGNESIUM: MAGNESIUM: 1.8 mg/dL (ref 1.7–2.4)

## 2017-02-06 MED ORDER — INFLUENZA VAC SPLIT QUAD 0.5 ML IM SUSY: 0.5000 mL | PREFILLED_SYRINGE | INTRAMUSCULAR | Status: DC

## 2017-02-06 MED ORDER — POTASSIUM CHLORIDE CRYS ER 20 MEQ PO TBCR
40.0000 meq | EXTENDED_RELEASE_TABLET | Freq: Every day | ORAL | Status: DC
  Administered 2017-02-06 – 2017-02-07 (×2): 40 meq via ORAL
  Filled 2017-02-06 (×2): qty 2

## 2017-02-06 MED ORDER — POLYETHYLENE GLYCOL 3350 17 G PO PACK
17.0000 g | PACK | Freq: Two times a day (BID) | ORAL | Status: DC
  Administered 2017-02-06 – 2017-02-08 (×4): 17 g via ORAL
  Filled 2017-02-06 (×3): qty 1

## 2017-02-06 MED ORDER — OXYCODONE-ACETAMINOPHEN 7.5-325 MG PO TABS
1.0000 | ORAL_TABLET | Freq: Three times a day (TID) | ORAL | Status: DC | PRN
  Administered 2017-02-06 – 2017-02-08 (×5): 1 via ORAL
  Filled 2017-02-06 (×5): qty 1

## 2017-02-06 MED ORDER — LABETALOL HCL 5 MG/ML IV SOLN
20.0000 mg | INTRAVENOUS | Status: DC | PRN
  Administered 2017-02-06: 20 mg via INTRAVENOUS
  Filled 2017-02-06: qty 4

## 2017-02-06 NOTE — Progress Notes (Signed)
BP starting to trend back up, now 202/130.  Will give 20mg  IV labetalol. And write as PRN.

## 2017-02-06 NOTE — Progress Notes (Signed)
PROGRESS NOTE    Kelly Novak  JKD:[id number] DOB: 03/24/1970 DOA: 02/05/2017 PCP: Patient, No Pcp Per   Brief Narrative: 46 year old obese female with history of cocaine abuse in the past admitted with hypertensive emergency.  Assessment & Plan:   #Hypertensive urgency likely in the setting of noncompliance causing clonidine withdrawal. -Blood pressure is better controlled now since patient received extra dose of IV hydralazine this morning.  For now continue Coreg, lisinopril hydrochlorothiazide, metoprolol, oral hydralazine and clonidine.  Monitor blood pressure closely.  Avoid sudden drop.  CT scan head no acute finding.  #Intractable nausea and diffuse abdominal pain: Patient reported nausea and some abdominal discomfort.  No vomiting today.  Checking lipase, abdominal x-ray.  Patient was comfortably lying until my evaluation and bedside.    #History of cocaine abuse: UDS positive for opiates.  #Hypokalemia: Replete potassium chloride.  Repeat lab in the morning.  Check magnesium level.  #Leukocytosis likely due to stress.  No symptoms of UTI.  Chest x-ray unremarkable.  DVT prophylaxis: Lovenox subcutaneous Code Status: Full code  Family Communication:no family at bedside Disposition Plan: Admitted    Consultants:   None  Procedures: None Antimicrobials: None  Subjective: Seen and examined at bedside.  Patient was comfortably lying under my arrival to the bedside.  She started complaining of nausea associated with abdominal discomfort.  Denies headache, dizziness chest pain or shortness of breath.  I examined patient in the presence of patient's nurse  Objective: Vitals:   02/06/17 0543 02/06/17 0550 02/06/17 0634 02/06/17 0727  BP: 103/78 128/87 (!) 161/122 (!) 143/101  Pulse: 90 87 92 95  Resp: 16 (!) 21 13 (!) 28  Temp:   98.1 F (36.7 C) 98.3 F (36.8 C)  TempSrc:   Oral Oral  SpO2: 100% 100% 98% 98%  Weight:   127.1 kg (280 lb 3.3 oz)   Height:   5\' 7"   (1.702 m)     Intake/Output Summary (Last 24 hours) at 02/06/2017 1140 Last data filed at 02/06/2017 0753 Gross per 24 hour  Intake -  Output 200 ml  Net -200 ml   Filed Weights   02/06/17 0634  Weight: 127.1 kg (280 lb 3.3 oz)    Examination:  General exam: Appears calm and comfortable  Respiratory system: Clear to auscultation. Respiratory effort normal. No wheezing or crackle Cardiovascular system: S1 & S2 heard, RRR.  No pedal edema. Gastrointestinal system: Abdomen is nondistended, soft and nontender. Normal bowel sounds heard. Central nervous system: Alert and oriented. No focal neurological deficits. Extremities: Symmetric 5 x 5 power. Skin: No rashes, lesions or ulcers Psychiatry: Judgement and insight appear normal. Mood & affect appropriate.     Data Reviewed: I have personally reviewed following labs and imaging studies  CBC: Recent Labs  Lab 02/05/17 1147 02/06/17 0414  WBC 13.4* 16.9*  NEUTROABS 10.7*  --   HGB 14.1 14.0  HCT 43.5 43.4  MCV 58.2* 58.0*  PLT 457* 099*   Basic Metabolic Panel: Recent Labs  Lab 02/05/17 1147 02/06/17 0414  NA 134* 136  K 3.6 3.2*  CL 99* 99*  CO2 23 24  GLUCOSE 124* 106*  BUN 7 13  CREATININE 0.79 0.84  CALCIUM 9.5 9.3   GFR: Estimated Creatinine Clearance: 118.5 mL/min (by C-G formula based on SCr of 0.84 mg/dL). Liver Function Tests: Recent Labs  Lab 02/05/17 1147  AST 23  ALT 14  ALKPHOS 102  BILITOT 1.1  PROT 7.8  ALBUMIN 4.0  Recent Labs  Lab 02/06/17 0921  LIPASE 19   No results for input(s): AMMONIA in the last 168 hours. Coagulation Profile: No results for input(s): INR, PROTIME in the last 168 hours. Cardiac Enzymes: No results for input(s): CKTOTAL, CKMB, CKMBINDEX, TROPONINI in the last 168 hours. BNP (last 3 results) No results for input(s): PROBNP in the last 8760 hours. HbA1C: No results for input(s): HGBA1C in the last 72 hours. CBG: No results for input(s): GLUCAP in the  last 168 hours. Lipid Profile: No results for input(s): CHOL, HDL, LDLCALC, TRIG, CHOLHDL, LDLDIRECT in the last 72 hours. Thyroid Function Tests: No results for input(s): TSH, T4TOTAL, FREET4, T3FREE, THYROIDAB in the last 72 hours. Anemia Panel: No results for input(s): VITAMINB12, FOLATE, FERRITIN, TIBC, IRON, RETICCTPCT in the last 72 hours. Sepsis Labs: No results for input(s): PROCALCITON, LATICACIDVEN in the last 168 hours.  Recent Results (from the past 240 hour(s))  MRSA PCR Screening     Status: None   Collection Time: 02/06/17  6:43 AM  Result Value Ref Range Status   MRSA by PCR NEGATIVE NEGATIVE Final    Comment:        The GeneXpert MRSA Assay (FDA approved for NASAL specimens only), is one component of a comprehensive MRSA colonization surveillance program. It is not intended to diagnose MRSA infection nor to guide or monitor treatment for MRSA infections.          Radiology Studies: Dg Chest 2 View  Result Date: 02/05/2017 CLINICAL DATA:  Shortness of breath and body aches. EXAM: CHEST  2 VIEW COMPARISON:  12/30/2016 FINDINGS: The cardiac silhouette, mediastinal and hilar contours are within normal limits given the AP projection. Slightly low lung volumes with mild vascular crowding and streaky basilar atelectasis but no infiltrates, effusions or edema. The bony thorax is intact. IMPRESSION: No acute cardiopulmonary findings. Electronically Signed   By: Marijo Sanes M.D.   On: 02/05/2017 17:22   Ct Head Wo Contrast  Result Date: 02/05/2017 CLINICAL DATA:  Generalized headache for 2 days EXAM: CT HEAD WITHOUT CONTRAST TECHNIQUE: Contiguous axial images were obtained from the base of the skull through the vertex without intravenous contrast. COMPARISON:  10/12/2016 FINDINGS: Brain: Stable left frontal periventricular white matter lacunar infarcts and pontine lacunar-type infarcts. No acute intracranial hemorrhage, mass lesion, new infarction, midline shift,  herniation, hydrocephalus, or extra-axial fluid collection. No focal mass effect or edema. Cisterns are patent. No cerebellar abnormality. Vascular: No hyperdense vessel or unexpected calcification. Skull: Normal. Negative for fracture or focal lesion. Sinuses/Orbits: Scattered mild ethmoid, sphenoid and maxillary sinus disease. No orbital abnormality. Mastoids remain clear. Other: None. IMPRESSION: Stable chronic lacunar-type infarcts as above. No acute intracranial abnormality by noncontrast CT. Electronically Signed   By: Jerilynn Mages.  Shick M.D.   On: 02/05/2017 16:58        Scheduled Meds: . carvedilol  25 mg Oral BID WC  . cloNIDine  0.3 mg Oral TID  . enoxaparin (LOVENOX) injection  40 mg Subcutaneous Q24H  . hydrALAZINE  100 mg Oral Q8H  . hydrochlorothiazide  25 mg Oral Daily   And  . lisinopril  20 mg Oral Daily  . potassium chloride  40 mEq Oral Daily   Continuous Infusions:   LOS: 0 days    Dron Tanna Furry, MD [facility] Pager [phone]  If 7PM-7AM, please contact night-coverage [email or web address] Password [email or web address] 02/06/2017, 11:40 AM

## 2017-02-07 DIAGNOSIS — F1910 Other psychoactive substance abuse, uncomplicated: Secondary | ICD-10-CM

## 2017-02-07 DIAGNOSIS — N179 Acute kidney failure, unspecified: Secondary | ICD-10-CM

## 2017-02-07 LAB — BASIC METABOLIC PANEL
Anion gap: 14 (ref 5–15)
BUN: 25 mg/dL — AB (ref 6–20)
CALCIUM: 8.5 mg/dL — AB (ref 8.9–10.3)
CHLORIDE: 99 mmol/L — AB (ref 101–111)
CO2: 22 mmol/L (ref 22–32)
CREATININE: 1.48 mg/dL — AB (ref 0.44–1.00)
GFR calc non Af Amer: 42 mL/min — ABNORMAL LOW (ref >60)
GFR, EST AFRICAN AMERICAN: 49 mL/min — AB (ref >60)
Glucose, Bld: 151 mg/dL — ABNORMAL HIGH (ref 65–99)
Potassium: 3.7 mmol/L (ref 3.5–5.1)
SODIUM: 135 mmol/L (ref 135–145)

## 2017-02-07 LAB — CBC
HCT: 41.4 % (ref 36.0–46.0)
HEMOGLOBIN: 12.9 g/dL (ref 12.0–15.0)
MCH: 18.5 pg — AB (ref 26.0–34.0)
MCHC: 31.2 g/dL (ref 30.0–36.0)
MCV: 59.2 fL — ABNORMAL LOW (ref 78.0–100.0)
Platelets: 388 10*3/uL (ref 150–400)
RBC: 6.99 MIL/uL — ABNORMAL HIGH (ref 3.87–5.11)
RDW: 16.9 % — AB (ref 11.5–15.5)
WBC: 10.9 10*3/uL — ABNORMAL HIGH (ref 4.0–10.5)

## 2017-02-07 MED ORDER — CLONIDINE HCL 0.1 MG PO TABS: 0.1000 mg | ORAL_TABLET | Freq: Three times a day (TID) | ORAL | Status: DC

## 2017-02-07 MED ORDER — GABAPENTIN 600 MG PO TABS
300.0000 mg | ORAL_TABLET | Freq: Two times a day (BID) | ORAL | Status: DC
  Administered 2017-02-07 – 2017-02-08 (×3): 300 mg via ORAL
  Filled 2017-02-07 (×3): qty 1

## 2017-02-07 MED ORDER — POTASSIUM CHLORIDE IN NACL 20-0.9 MEQ/L-% IV SOLN
INTRAVENOUS | Status: DC
  Administered 2017-02-07: 10:00:00 via INTRAVENOUS
  Filled 2017-02-07 (×3): qty 1000

## 2017-02-07 MED ORDER — CLONIDINE HCL 0.2 MG PO TABS
0.2000 mg | ORAL_TABLET | Freq: Three times a day (TID) | ORAL | Status: DC
  Administered 2017-02-07 – 2017-02-08 (×4): 0.2 mg via ORAL
  Filled 2017-02-07 (×4): qty 1

## 2017-02-07 NOTE — Evaluation (Signed)
Occupational Therapy Evaluation Patient Details Name: Kelly Novak MRN: [id number] DOB: Nov 20, 1970 Today's Date: 02/07/2017    History of Present Illness 46 year old obese female with history of cocaine abuse in the past admitted with hypertensive emergency.  Recent right AKA and pt awaiting prosthesis.     Clinical Impression   PTA, pt was living with her son who provided supervision for bathing tasks and assistance for IADL. Pt was able to stand-pivot for toilet and shower transfers without physical assistance or RW (although does report use of nearby furniture for stability). Pt utilizes w/c in her home for functional mobility and is in the process of pursuing a R LE prosthesis. This session, pt was able to complete stand-pivot toilet transfers with mod assist +2 and LB ADL with min guard assist. Pt would benefit from continued OT services while admitted to improve independence with ADL and functional mobility. She is impulsive at times and will need 24 hour assistance/supervision which she reports she will have from father and son once she is home. Need to verify this with family. Recommend home health OT follow-up post-acute D/C.    Follow Up Recommendations  Home health OT;Supervision/Assistance - 24 hour    Equipment Recommendations  3 in 1 bedside commode    Recommendations for Other Services       Precautions / Restrictions Precautions Precautions: Fall Restrictions Weight Bearing Restrictions: No      Mobility Bed Mobility Overal bed mobility: Independent                Transfers Overall transfer level: Needs assistance Equipment used: Rolling walker (2 wheeled) Transfers: Sit to/from [facility] Sit to Stand: Mod assist;+2 physical assistance Stand pivot transfers: Min assist;+2 physical assistance       General transfer comment: Pt needed some assist to power up and for steadying once up.  Pt able to stand pivot with use of RW and +2 min  assist to recliner.     Balance Overall balance assessment: Needs assistance Sitting-balance support: No upper extremity supported;Feet supported Sitting balance-Leahy Scale: Good     Standing balance support: Bilateral upper extremity supported;During functional activity Standing balance-Leahy Scale: Poor Standing balance comment: relies on RW for balance and external support - states that son provides external support at home as well.                            ADL either performed or assessed with clinical judgement   ADL Overall ADL's : Needs assistance/impaired Eating/Feeding: Set up;Sitting   Grooming: Set up;Sitting   Upper Body Bathing: Set up;Sitting   Lower Body Bathing: Min guard;Sitting/lateral leans   Upper Body Dressing : Set up;Sitting   Lower Body Dressing: Min guard;Sitting/lateral leans   Toilet Transfer: +2 for safety/equipment;Stand-pivot;BSC;RW;Moderate assistance;+2 for physical assistance Toilet Transfer Details (indicate cue type and reason): Simulated from bed to chair.  Toileting- Clothing Manipulation and Hygiene: Minimal assistance;Sitting/lateral lean         General ADL Comments: Pt tangential in conversation. Pt reports she is supposed to start OP PT soon for preparation for prosthesis.      Vision Patient Visual Report: No change from baseline Vision Assessment?: No apparent visual deficits     Perception     Praxis      Pertinent Vitals/Pain Pain Assessment: No/denies pain     Hand Dominance Right   Extremity/Trunk Assessment Upper Extremity Assessment Upper Extremity Assessment: Overall WFL for  tasks assessed   Lower Extremity Assessment Lower Extremity Assessment: Overall WFL for tasks assessed   Cervical / Trunk Assessment Cervical / Trunk Assessment: Normal   Communication Communication Communication: No difficulties   Cognition Arousal/Alertness: Awake/alert Behavior During Therapy: WFL for tasks  assessed/performed;Impulsive Overall Cognitive Status: Impaired/Different from baseline Area of Impairment: Attention;Safety/judgement                   Current Attention Level: Selective     Safety/Judgement: Decreased awareness of safety     General Comments: Pt impulsive at times and with decreased attention making her unsafe at times.    General Comments       Exercises     Shoulder Instructions      Home Living Family/patient expects to be discharged to:: Private residence Living Arrangements: Children Available Help at Discharge: Family;Available 24 hours/day Type of Home: Apartment Home Access: Level entry     Home Layout: One level     Bathroom Shower/Tub: Teacher, early years/pre: Standard Bathroom Accessibility: Yes   Home Equipment: Walker - standard;Crutches;Bedside commode;Wheelchair - manual   Additional Comments: Slides from toilet seat to shower seat by stepping leg over edge of tub before standing to pivot to chair.       Prior Functioning/Environment Level of Independence: Independent with assistive device(s);Needs assistance  Gait / Transfers Assistance Needed: uses WC for mobility and transfers independently  ADL's / Homemaking Assistance Needed: Son assists with showers and meal prep along with performing most IADLs            OT Problem List: Decreased strength;Decreased activity tolerance;Impaired balance (sitting and/or standing);Decreased safety awareness;Decreased knowledge of use of DME or AE;Decreased knowledge of precautions      OT Treatment/Interventions: Self-care/ADL training;Therapeutic exercise;DME and/or AE instruction;Energy conservation;Therapeutic activities;Patient/family education;Balance training    OT Goals(Current goals can be found in the care plan section) Acute Rehab OT Goals Patient Stated Goal: to go home OT Goal Formulation: With patient Time For Goal Achievement: 02/21/17 Potential to  Achieve Goals: Good ADL Goals Pt Will Perform Lower Body Bathing: with modified independence;sitting/lateral leans Pt Will Perform Lower Body Dressing: with modified independence;sitting/lateral leans Pt Will Transfer to Toilet: with supervision;stand pivot transfer;bedside commode(drop-arm BSC) Pt Will Perform Toileting - Clothing Manipulation and hygiene: with modified independence;sitting/lateral leans  OT Frequency: Min 2X/week   Barriers to D/C:            Co-evaluation PT/OT/SLP Co-Evaluation/Treatment: Yes Reason for Co-Treatment: For patient/therapist safety PT goals addressed during session: Mobility/safety with mobility OT goals addressed during session: ADL's and self-care      AM-PAC PT "6 Clicks" Daily Activity     Outcome Measure Help from another person eating meals?: A Little Help from another person taking care of personal grooming?: A Little Help from another person toileting, which includes using toliet, bedpan, or urinal?: A Lot Help from another person bathing (including washing, rinsing, drying)?: A Little Help from another person to put on and taking off regular upper body clothing?: A Little Help from another person to put on and taking off regular lower body clothing?: A Little 6 Click Score: 17   End of Session Equipment Utilized During Treatment: Gait belt Nurse Communication: Mobility status  Activity Tolerance: Patient tolerated treatment well Patient left: in chair;with call bell/phone within reach;with chair alarm set  OT Visit Diagnosis: Unsteadiness on feet (R26.81);Muscle weakness (generalized) (M62.81)  Time: 1207-1226 OT Time Calculation (min): 19 min Charges:  OT General Charges $OT Visit: 1 Visit OT Evaluation $OT Eval Moderate Complexity: 1 Mod G-Codes:     Norman Herrlich, MS OTR/L  Pager: [place] It[place] Mcgowan 02/07/2017, 3:53 PM

## 2017-02-07 NOTE — Progress Notes (Signed)
PROGRESS NOTE    Kelly Novak  JJK:[id number] DOB: 13-Jul-1970 DOA: 02/05/2017 PCP: Patient, No Pcp Per   Brief Narrative: 46 year old obese female with history of cocaine abuse in the past admitted with hypertensive emergency.  Assessment & Plan:   #Hypertensive urgency likely in the setting of noncompliance causing clonidine withdrawal. -Blood pressure is better controlled.  Had lower BP this morning.  I discontinued lisinopril, hydrochlorothiazide and reduce the dose of clonidine 0.2.  Patient's blood pressure improving with home medication points that patient was not compliant with her medication at home.  Need to monitor closely. CT scan head no acute finding.  #Intractable nausea and diffuse abdominal pain: Patient reported nausea and some abdominal discomfort.  No vomiting today.  Height is normal.  Abdominal x-ray with mild constipation.  A stool softener order.  Continue supportive care.  #Acute kidney injury  Multifactorial including hypertensive urgency, use of diuretics.  UA unremarkable.  Starting gentle IV hydration.  Monitor BMP.  I would neurotoxins.  Discussed with the patient regarding abnormal lab at bedside.  #History of cocaine abuse: UDS positive for opiates.  #Hypokalemia: Serum potassium level improved.  Magnesium level acceptable.  #Leukocytosis likely due to stress.  No symptoms of UTI.  Chest x-ray unremarkable.  #History of AK on right side: Patient reported pain which is chronic in nature.  On oral oxycodone.  Added Neurontin.  Recommend to follow-up with orthopedics outpatient.  DVT prophylaxis: Lovenox subcutaneous Code Status: Full code  Family Communication: Patient's son was at bedside who was sleeping. Disposition Plan: Admitted    Consultants:   None  Procedures: None Antimicrobials: None  Subjective: Seen and examined at bedside.  Reported right AKA/thigh pain which is chronic in nature.  Reported mild nausea but no vomiting.  Denied  headache, dizziness, chest pain or shortness of breath. Objective: Vitals:   02/07/17 0517 02/07/17 0854 02/07/17 1115 02/07/17 1117  BP:  (!) 154/115 (!) 135/95   Pulse:  (!) 110 90   Resp:  17 (!) 22   Temp: 98.4 F (36.9 C) 98.1 F (36.7 C)  98.3 F (36.8 C)  TempSrc: Oral Oral  Oral  SpO2:  99% 99%   Weight:      Height:        Intake/Output Summary (Last 24 hours) at 02/07/2017 1242 Last data filed at 02/07/2017 0854 Gross per 24 hour  Intake -  Output 350 ml  Net -350 ml   Filed Weights   02/06/17 0634  Weight: 127.1 kg (280 lb 3.3 oz)    Examination:  General exam: Lying in bed comfortable, not in distress. Respiratory system: Clear bilateral, respiratory for normal Cardiovascular system: Regular rate rhythm S1-S2 normal.  No edema in lower extremity. Gastrointestinal system: Abdomen is nondistended, soft and nontender. Normal bowel sounds heard. Central nervous system: Alert and oriented. No focal neurological deficits. Extremities: Right AKA. Skin: No rashes, lesions or ulcers Psychiatry: Judgement and insight appear normal. Mood & affect appropriate.     Data Reviewed: I have personally reviewed following labs and imaging studies  CBC: Recent Labs  Lab 02/05/17 1147 02/06/17 0414 02/07/17 0305  WBC 13.4* 16.9* 10.9*  NEUTROABS 10.7*  --   --   HGB 14.1 14.0 12.9  HCT 43.5 43.4 41.4  MCV 58.2* 58.0* 59.2*  PLT 457* 455* 371   Basic Metabolic Panel: Recent Labs  Lab 02/05/17 1147 02/06/17 0414 02/06/17 1152 02/07/17 0305  NA 134* 136  --  135  K 3.6  3.2*  --  3.7  CL 99* 99*  --  99*  CO2 23 24  --  22  GLUCOSE 124* 106*  --  151*  BUN 7 13  --  25*  CREATININE 0.79 0.84  --  1.48*  CALCIUM 9.5 9.3  --  8.5*  MG  --   --  1.8  --    GFR: Estimated Creatinine Clearance: 67.2 mL/min (A) (by C-G formula based on SCr of 1.48 mg/dL (H)). Liver Function Tests: Recent Labs  Lab 02/05/17 1147  AST 23  ALT 14  ALKPHOS 102  BILITOT 1.1    PROT 7.8  ALBUMIN 4.0   Recent Labs  Lab 02/06/17 0921  LIPASE 19   No results for input(s): AMMONIA in the last 168 hours. Coagulation Profile: No results for input(s): INR, PROTIME in the last 168 hours. Cardiac Enzymes: No results for input(s): CKTOTAL, CKMB, CKMBINDEX, TROPONINI in the last 168 hours. BNP (last 3 results) No results for input(s): PROBNP in the last 8760 hours. HbA1C: No results for input(s): HGBA1C in the last 72 hours. CBG: No results for input(s): GLUCAP in the last 168 hours. Lipid Profile: No results for input(s): CHOL, HDL, LDLCALC, TRIG, CHOLHDL, LDLDIRECT in the last 72 hours. Thyroid Function Tests: No results for input(s): TSH, T4TOTAL, FREET4, T3FREE, THYROIDAB in the last 72 hours. Anemia Panel: No results for input(s): VITAMINB12, FOLATE, FERRITIN, TIBC, IRON, RETICCTPCT in the last 72 hours. Sepsis Labs: No results for input(s): PROCALCITON, LATICACIDVEN in the last 168 hours.  Recent Results (from the past 240 hour(s))  MRSA PCR Screening     Status: None   Collection Time: 02/06/17  6:43 AM  Result Value Ref Range Status   MRSA by PCR NEGATIVE NEGATIVE Final    Comment:        The GeneXpert MRSA Assay (FDA approved for NASAL specimens only), is one component of a comprehensive MRSA colonization surveillance program. It is not intended to diagnose MRSA infection nor to guide or monitor treatment for MRSA infections.          Radiology Studies: Dg Chest 2 View  Result Date: 02/05/2017 CLINICAL DATA:  Shortness of breath and body aches. EXAM: CHEST  2 VIEW COMPARISON:  12/30/2016 FINDINGS: The cardiac silhouette, mediastinal and hilar contours are within normal limits given the AP projection. Slightly low lung volumes with mild vascular crowding and streaky basilar atelectasis but no infiltrates, effusions or edema. The bony thorax is intact. IMPRESSION: No acute cardiopulmonary findings. Electronically Signed   By: Marijo Sanes M.D.   On: 02/05/2017 17:22   Dg Abd 1 View  Result Date: 02/06/2017 CLINICAL DATA:  Abdominal pain for several hours EXAM: ABDOMEN - 1 VIEW COMPARISON:  11/04/2015 FINDINGS: Scattered large and small bowel gas is noted. Fecal material is noted throughout the colon consistent with a degree of constipation. No obstructive changes are noted. Mild degenerative change of the lumbar spine is seen. No free air is noted. Changes of prior cholecystectomy are seen. IMPRESSION: Mild constipation. Electronically Signed   By: Inez Catalina M.D.   On: 02/06/2017 11:46   Ct Head Wo Contrast  Result Date: 02/05/2017 CLINICAL DATA:  Generalized headache for 2 days EXAM: CT HEAD WITHOUT CONTRAST TECHNIQUE: Contiguous axial images were obtained from the base of the skull through the vertex without intravenous contrast. COMPARISON:  10/12/2016 FINDINGS: Brain: Stable left frontal periventricular white matter lacunar infarcts and pontine lacunar-type infarcts. No acute intracranial hemorrhage, mass  lesion, new infarction, midline shift, herniation, hydrocephalus, or extra-axial fluid collection. No focal mass effect or edema. Cisterns are patent. No cerebellar abnormality. Vascular: No hyperdense vessel or unexpected calcification. Skull: Normal. Negative for fracture or focal lesion. Sinuses/Orbits: Scattered mild ethmoid, sphenoid and maxillary sinus disease. No orbital abnormality. Mastoids remain clear. Other: None. IMPRESSION: Stable chronic lacunar-type infarcts as above. No acute intracranial abnormality by noncontrast CT. Electronically Signed   By: Jerilynn Mages.  Shick M.D.   On: 02/05/2017 16:58        Scheduled Meds: . carvedilol  25 mg Oral BID WC  . cloNIDine  0.2 mg Oral TID  . enoxaparin (LOVENOX) injection  40 mg Subcutaneous Q24H  . gabapentin  300 mg Oral BID  . hydrALAZINE  100 mg Oral Q8H  . Influenza vac split quadrivalent PF  0.5 mL Intramuscular Tomorrow-1000  . polyethylene glycol  17 g  Oral BID  . potassium chloride  40 mEq Oral Daily   Continuous Infusions: . 0.9 % NaCl with KCl 20 mEq / L 75 mL/hr at 02/07/17 0940     LOS: 1 day    Dron Tanna Furry, MD [facility] Pager [phone]  If 7PM-7AM, please contact night-coverage [email or web address] Password [email or web address] 02/07/2017, 12:42 PM

## 2017-02-07 NOTE — Evaluation (Signed)
Physical Therapy Evaluation Patient Details Name: Kelly Novak MRN: [id number] DOB: Feb 13, 1971 Today's Date: 02/07/2017   History of Present Illness  46 year old obese female with history of cocaine abuse in the past admitted with hypertensive emergency.  Recent right AKA and pt awaiting prosthesis.    Clinical Impression  Pt admitted with above diagnosis. Pt currently with functional limitations due to the deficits listed below (see PT Problem List). Pt was able to stand and pivot with +2 person min assist to recliner.  Pt states that she has her dad that can help when her son can't.  Pt states she is close to baseline.  If 24 hour care, home with HHPT is appropriate.  Will follow acutely.   Pt will benefit from skilled PT to increase their independence and safety with mobility to allow discharge to the venue listed below.     Follow Up Recommendations Home health PT;Supervision/Assistance - 24 hour    Equipment Recommendations  None recommended by PT    Recommendations for Other Services       Precautions / Restrictions Precautions Precautions: Fall Restrictions Weight Bearing Restrictions: No      Mobility  Bed Mobility Overal bed mobility: Independent                Transfers Overall transfer level: Needs assistance Equipment used: Rolling walker (2 wheeled) Transfers: Sit to/from [facility] Sit to Stand: Mod assist;+2 physical assistance Stand pivot transfers: Min assist;+2 physical assistance       General transfer comment: Pt neededsome assist to power up and for steadying once up.  Pt able to stand pivot with use of RW and +2 min assist to recliner.   Ambulation/Gait                Stairs            Wheelchair Mobility    Modified Rankin (Stroke Patients Only)       Balance Overall balance assessment: Needs assistance Sitting-balance support: No upper extremity supported;Feet supported Sitting balance-Leahy Scale:  Good     Standing balance support: Bilateral upper extremity supported;During functional activity Standing balance-Leahy Scale: Poor Standing balance comment: relies on RW for balance and external support - states that son provides external support at home as well.                              Pertinent Vitals/Pain Pain Assessment: No/denies pain  VSS  Home Living Family/patient expects to be discharged to:: Private residence Living Arrangements: Children Available Help at Discharge: Family;Available 24 hours/day Type of Home: Apartment Home Access: Level entry     Home Layout: One level Home Equipment: Walker - standard;Crutches;Bedside commode;Wheelchair - manual Additional Comments: normally sits on edge of tub to bathe    Prior Function Level of Independence: Independent with assistive device(s);Needs assistance   Gait / Transfers Assistance Needed: uses WC for mobility and transfers independently   ADL's / Homemaking Assistance Needed: Son assists with showers and meal prep along with performing most IADLs        Hand Dominance   Dominant Hand: Right    Extremity/Trunk Assessment   Upper Extremity Assessment Upper Extremity Assessment: Defer to OT evaluation    Lower Extremity Assessment Lower Extremity Assessment: Overall WFL for tasks assessed    Cervical / Trunk Assessment Cervical / Trunk Assessment: Normal  Communication   Communication: No difficulties  Cognition Arousal/Alertness: Awake/alert Behavior  During Therapy: WFL for tasks assessed/performed Overall Cognitive Status: Within Functional Limits for tasks assessed                                        General Comments      Exercises     Assessment/Plan    PT Assessment Patient needs continued PT services  PT Problem List Decreased strength;Decreased activity tolerance;Decreased balance;Decreased mobility;Decreased knowledge of use of DME;Decreased safety  awareness;Decreased knowledge of precautions       PT Treatment Interventions DME instruction;Gait training;Functional mobility training;Therapeutic activities;Therapeutic exercise;Balance training;Patient/family education;Wheelchair mobility training    PT Goals (Current goals can be found in the Care Plan section)  Acute Rehab PT Goals Patient Stated Goal: to go home PT Goal Formulation: With patient Time For Goal Achievement: 02/21/17 Potential to Achieve Goals: Good    Frequency Min 3X/week   Barriers to discharge        Co-evaluation PT/OT/SLP Co-Evaluation/Treatment: Yes Reason for Co-Treatment: For patient/therapist safety PT goals addressed during session: Mobility/safety with mobility         AM-PAC PT "6 Clicks" Daily Activity  Outcome Measure Difficulty turning over in bed (including adjusting bedclothes, sheets and blankets)?: None Difficulty moving from lying on back to sitting on the side of the bed? : None Difficulty sitting down on and standing up from a chair with arms (e.g., wheelchair, bedside commode, etc,.)?: None Help needed moving to and from a bed to chair (including a wheelchair)?: A Lot Help needed walking in hospital room?: Total Help needed climbing 3-5 steps with a railing? : Total 6 Click Score: 16    End of Session Equipment Utilized During Treatment: Gait belt Activity Tolerance: Patient limited by fatigue Patient left: in chair;with call bell/phone within reach;with chair alarm set Nurse Communication: Mobility status PT Visit Diagnosis: Unsteadiness on feet (R26.81);Muscle weakness (generalized) (M62.81)    Time: 1207-1226 PT Time Calculation (min) (ACUTE ONLY): 19 min   Charges:   PT Evaluation $PT Eval Moderate Complexity: 1 Mod     PT G Codes:        Cailey Trigueros,PT Acute Rehabilitation [phone] (pager)   Denice Paradise 02/07/2017, 1:28 PM

## 2017-02-08 DIAGNOSIS — R1032 Left lower quadrant pain: Secondary | ICD-10-CM

## 2017-02-08 LAB — RENAL FUNCTION PANEL
ALBUMIN: 3.7 g/dL (ref 3.5–5.0)
ANION GAP: 8 (ref 5–15)
BUN: 31 mg/dL — AB (ref 6–20)
CO2: 23 mmol/L (ref 22–32)
Calcium: 8.4 mg/dL — ABNORMAL LOW (ref 8.9–10.3)
Chloride: 104 mmol/L (ref 101–111)
Creatinine, Ser: 0.95 mg/dL (ref 0.44–1.00)
GFR calc Af Amer: >60 mL/min (ref >60)
GFR calc non Af Amer: >60 mL/min (ref >60)
GLUCOSE: 113 mg/dL — AB (ref 65–99)
PHOSPHORUS: 3.5 mg/dL (ref 2.5–4.6)
POTASSIUM: 4.6 mmol/L (ref 3.5–5.1)
SODIUM: 135 mmol/L (ref 135–145)

## 2017-02-08 MED ORDER — CLONIDINE HCL 0.2 MG PO TABS: 0.2000 mg | ORAL_TABLET | Freq: Three times a day (TID) | ORAL | 0 refills | Status: DC

## 2017-02-08 MED ORDER — METOPROLOL TARTRATE 5 MG/5ML IV SOLN
5.0000 mg | Freq: Once | INTRAVENOUS | Status: DC
  Filled 2017-02-08: qty 5

## 2017-02-08 MED ORDER — METOPROLOL TARTRATE 5 MG/5ML IV SOLN
5.0000 mg | Freq: Once | INTRAVENOUS | Status: AC
  Administered 2017-02-08: 5 mg via INTRAVENOUS
  Filled 2017-02-08: qty 5

## 2017-02-08 MED ORDER — HYDRALAZINE HCL 100 MG PO TABS: 100.0000 mg | ORAL_TABLET | Freq: Three times a day (TID) | ORAL | 0 refills | Status: DC

## 2017-02-08 MED ORDER — CARVEDILOL 25 MG PO TABS: 25.0000 mg | ORAL_TABLET | Freq: Two times a day (BID) | ORAL | 0 refills | Status: DC

## 2017-02-08 MED ORDER — GABAPENTIN 600 MG PO TABS: 300.0000 mg | ORAL_TABLET | Freq: Two times a day (BID) | ORAL | 0 refills | Status: DC

## 2017-02-08 MED ORDER — ACETAMINOPHEN 325 MG PO TABS
650.0000 mg | ORAL_TABLET | Freq: Four times a day (QID) | ORAL | Status: DC | PRN
Start: 2017-02-08 — End: 2017-09-22

## 2017-02-08 NOTE — Discharge Summary (Signed)
Physician Discharge Summary  Kelly Novak AST:[id number] DOB: 1970/04/30 DOA: 02/05/2017  PCP: Patient, No Pcp Per  Admit date: 02/05/2017 Discharge date: 02/08/2017  Admitted From:home Disposition:home  Recommendations for Outpatient Follow-up:  1. Follow up with PCP in 1-2 weeks 2. Please obtain BMP/CBC in one week  Home Health:yes Equipment/Devices:none Discharge Condition:stable CODE STATUS:full code Diet recommendation:low salt heart healthy diet  Brief/Interim Summary: 46 year old obese female with history of cocaine abuse in the past admitted with hypertensive emergency.  #Hypertensive urgency likely in the setting of noncompliance causing clonidine withdrawal. -Home medications resumed in the hospital with improvement in blood pressure.  Education provided to the patient regarding compliance with the medication and close monitoring of blood pressure.  The medications are adjusted as below.  She needs to follow-up with PCP.  #Intractable nausea and diffuse abdominal pain: Clinically improved and tolerating diet well.  #Acute kidney injury  Multifactorial including hypertensive urgency, use of diuretics.  UA unremarkable.  Renal function improved.  Recommended to monitor labs with PCP.  Marland Kitchen#History of cocaine abuse: UDS positive for opiates.  Patient was counseled  #Hypokalemia: Serum potassium level improved.  Magnesium level acceptable.  #Leukocytosis likely due to stress.  No symptoms of UTI.  Chest x-ray unremarkable.  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  PT OT recommended home care services which is ordered.  Next supportive care.  Patient's blood pressure is better improved.  Medication adjusted.  Prescription provided to the patient.  Home care services arranged.  She needs close follow-up with PCP.  Recommend to monitor blood pressure twice a day at home and take low-salt diet.  She verbalized  understanding.  Discharge Diagnoses:  Principal Problem:   Hypertensive urgency Active Problems:   Pain in the abdomen   Polysubstance abuse (HCC)   Nausea   AKI (acute kidney injury) [facility])    Discharge Instructions  Discharge Instructions    Call MD for:  difficulty breathing, headache or visual disturbances   Complete by:  As directed    Call MD for:  extreme fatigue   Complete by:  As directed    Call MD for:  hives   Complete by:  As directed    Call MD for:  persistant dizziness or light-headedness   Complete by:  As directed    Call MD for:  persistant nausea and vomiting   Complete by:  As directed    Call MD for:  severe uncontrolled pain   Complete by:  As directed    Call MD for:  temperature >100.4   Complete by:  As directed    Diet - low sodium heart healthy   Complete by:  As directed    Discharge instructions   Complete by:  As directed    Please check your BP twice a day and follow up with your PCP in 1-2 weeks.   Increase activity slowly   Complete by:  As directed      Allergies as of 02/08/2017      Reactions   Tape Itching, Rash   "took skin off" please use wrap if possible      Medication List    STOP taking these medications   amLODipine 10 MG tablet Commonly known as:  NORVASC   Cariprazine HCl 4.5 MG Caps Commonly known as:  VRAYLAR   lisinopril-hydrochlorothiazide 20-12.5 MG tablet Commonly known as:  ZESTORETIC   lisinopril-hydrochlorothiazide 20-25 MG tablet Commonly known as:  PRINZIDE,ZESTORETIC  metoprolol succinate 100 MG 24 hr tablet Commonly known as:  TOPROL-XL   nitroGLYCERIN 0.4 MG SL tablet Commonly known as:  NITROSTAT     TAKE these medications   acetaminophen 325 MG tablet Commonly known as:  TYLENOL Take 2 tablets (650 mg total) by mouth every 6 (six) hours as needed for mild pain (or Fever >/= 101).   BC HEADACHE POWDER PO Take 1 packet by mouth 2 (two) times daily as needed (pain/headache).   BENGAY  EX Apply 1 application topically 2 (two) times daily as needed (joint pain).   carvedilol 25 MG tablet Commonly known as:  COREG Take 1 tablet (25 mg total) by mouth 2 (two) times daily with a meal.   cloNIDine 0.2 MG tablet Commonly known as:  CATAPRES Take 1 tablet (0.2 mg total) by mouth 3 (three) times daily. What changed:    medication strength  how much to take   diphenhydrAMINE 25 mg capsule Commonly known as:  BENADRYL Take 50 mg by mouth at bedtime.   gabapentin 600 MG tablet Commonly known as:  NEURONTIN Take 0.5 tablets (300 mg total) by mouth 2 (two) times daily.   hydrALAZINE 100 MG tablet Commonly known as:  APRESOLINE Take 1 tablet (100 mg total) by mouth every 8 (eight) hours.   ranitidine 150 MG tablet Commonly known as:  ZANTAC Take 150 mg by mouth 2 (two) times daily before a meal.      Follow-up Information    [facility]. Schedule an appointment as soon as possible for a visit in 2 week(s).   Contact information: [place] [phone]         Allergies  Allergen Reactions  . Tape Itching and Rash    "took skin off" please use wrap if possible    Consultations: None  Procedures/Studies: None  Subjective: Seen and examined at bedside.  Reported doing well.  Eager to go home today.  Denies headache, dizziness, nausea, vomiting, chest pain, shortness of breath.  No dysuria urgency or frequency.  Tolerating diet well.  Discharge Exam: Vitals:   02/08/17 0921 02/08/17 1114  BP: (!) 157/77 (!) 122/94  Pulse:  (!) 107  Resp:  14  Temp:  98.7 F (37.1 C)  SpO2:  95%   Vitals:   02/08/17 0735 02/08/17 0809 02/08/17 0921 02/08/17 1114  BP: (!) 170/120 (!) 157/77 (!) 157/77 (!) 122/94  Pulse: (!) 105 (!) 126  (!) 107  Resp: 17   14  Temp: 98.7 F (37.1 C)   98.7 F (37.1 C)  TempSrc: Oral   Oral  SpO2:    95%  Weight:      Height:        General: Pt  is alert, awake, not in acute distress Cardiovascular: RRR, S1/S2 +, no rubs, no gallops Respiratory: CTA bilaterally, no wheezing, no rhonchi Abdominal: Soft, NT, ND, bowel sounds + Extremities: no edema, no cyanosis Right AKA, stump site looks clean with no wound or skin damage.   The results of significant diagnostics from this hospitalization (including imaging, microbiology, ancillary and laboratory) are listed below for reference.     Microbiology: Recent Results (from the past 240 hour(s))  MRSA PCR Screening     Status: None   Collection Time: 02/06/17  6:43 AM  Result Value Ref Range Status   MRSA by PCR NEGATIVE NEGATIVE Final    Comment:  The GeneXpert MRSA Assay (FDA approved for NASAL specimens only), is one component of a comprehensive MRSA colonization surveillance program. It is not intended to diagnose MRSA infection nor to guide or monitor treatment for MRSA infections.      Labs: BNP (last 3 results) Recent Labs    08/07/16 0629 10/13/16 2326 02/05/17 1606  BNP 175.1* 183.8* 694.8*   Basic Metabolic Panel: Recent Labs  Lab 02/05/17 1147 02/06/17 0414 02/06/17 1152 02/07/17 0305 02/08/17 0252  NA 134* 136  --  135 135  K 3.6 3.2*  --  3.7 4.6  CL 99* 99*  --  99* 104  CO2 23 24  --  22 23  GLUCOSE 124* 106*  --  151* 113*  BUN 7 13  --  25* 31*  CREATININE 0.79 0.84  --  1.48* 0.95  CALCIUM 9.5 9.3  --  8.5* 8.4*  MG  --   --  1.8  --   --   PHOS  --   --   --   --  3.5   Liver Function Tests: Recent Labs  Lab 02/05/17 1147 02/08/17 0252  AST 23  --   ALT 14  --   ALKPHOS 102  --   BILITOT 1.1  --   PROT 7.8  --   ALBUMIN 4.0 3.7   Recent Labs  Lab 02/06/17 0921  LIPASE 19   No results for input(s): AMMONIA in the last 168 hours. CBC: Recent Labs  Lab 02/05/17 1147 02/06/17 0414 02/07/17 0305  WBC 13.4* 16.9* 10.9*  NEUTROABS 10.7*  --   --   HGB 14.1 14.0 12.9  HCT 43.5 43.4 41.4  MCV 58.2* 58.0* 59.2*   PLT 457* 455* 388   Cardiac Enzymes: No results for input(s): CKTOTAL, CKMB, CKMBINDEX, TROPONINI in the last 168 hours. BNP: Invalid input(s): POCBNP CBG: No results for input(s): GLUCAP in the last 168 hours. D-Dimer No results for input(s): DDIMER in the last 72 hours. Hgb A1c No results for input(s): HGBA1C in the last 72 hours. Lipid Profile No results for input(s): CHOL, HDL, LDLCALC, TRIG, CHOLHDL, LDLDIRECT in the last 72 hours. Thyroid function studies No results for input(s): TSH, T4TOTAL, T3FREE, THYROIDAB in the last 72 hours.  Invalid input(s): FREET3 Anemia work up No results for input(s): VITAMINB12, FOLATE, FERRITIN, TIBC, IRON, RETICCTPCT in the last 72 hours. Urinalysis    Component Value Date/Time   COLORURINE STRAW (A) 02/05/2017 1606   APPEARANCEUR CLEAR 02/05/2017 1606   LABSPEC 1.009 02/05/2017 1606   PHURINE 6.0 02/05/2017 1606   GLUCOSEU NEGATIVE 02/05/2017 1606   HGBUR NEGATIVE 02/05/2017 1606   BILIRUBINUR NEGATIVE 02/05/2017 1606   KETONESUR NEGATIVE 02/05/2017 1606   PROTEINUR NEGATIVE 02/05/2017 1606   UROBILINOGEN 0.2 06/06/2013 1815   NITRITE NEGATIVE 02/05/2017 1606   LEUKOCYTESUR NEGATIVE 02/05/2017 1606   Sepsis Labs Invalid input(s): PROCALCITONIN,  WBC,  LACTICIDVEN Microbiology Recent Results (from the past 240 hour(s))  MRSA PCR Screening     Status: None   Collection Time: 02/06/17  6:43 AM  Result Value Ref Range Status   MRSA by PCR NEGATIVE NEGATIVE Final    Comment:        The GeneXpert MRSA Assay (FDA approved for NASAL specimens only), is one component of a comprehensive MRSA colonization surveillance program. It is not intended to diagnose MRSA infection nor to guide or monitor treatment for MRSA infections.      Time coordinating discharge: 32 minutes  SIGNED:   Rosita Fire, MD  [facility] 02/08/2017, 11:41 AM  If 7PM-7AM, please contact night-coverage [email or web address] Password  [email or web address]

## 2017-02-08 NOTE — Progress Notes (Signed)
Discharge note. Patient and son educated at bedside. RN educated on follow-up appointments, medications and when to take them, new medications, diet and activity recommendations, signs and symptoms to look for, when to check BP, and when to call the MD. PIV removed without complications.

## 2017-02-08 NOTE — Care Management Note (Addendum)
Case Management Note  Patient Details  Name: PHALLON HAYDU MRN: [id number] Date of Birth: [birth year]  Subjective/Objective:       Pt admitted with Hypertension emergency             Action/Plan:   PTA independent from home with son - pts son and father will provide recommended 24 hour supervision.  Pt is eager to return home.  Pt had amputation in July and has been wheelchair dependent since then.  Pt has wheelchair and shower chair - denied needing additional equipment.  Pt would like HH as ordered -CM offered choice and pt chose [facility] - agency contacted and referral accepted (agency made aware of discharge today ). Pt refused 3:1.   Pt informed CM that she currently has Dr Kevan Ny as PCP but she is interested in finding a new PCP - Information provided on AVS. Pt confirmed that she is now able to afford medication copay as she recently received medicaid.     Expected Discharge Date:  02/08/17               Expected Discharge Plan:  [place]  In-House Referral:  Clinical Social Work  Discharge planning Services  CM Consult  Post Acute Care Choice:    Choice offered to:  Patient  DME Arranged:    DME Agency:     HH Arranged:  PT, OT, Nurse's Aide, RN [place] Agency:  [place]  Status of Service:  Completed, signed off  If discussed at [place] of Stay Meetings, dates discussed:    Additional Comments:  Maryclare Labrador, RN 02/08/2017, 12:19 PM

## 2017-02-08 NOTE — Progress Notes (Signed)
MD notified about elevated  BP 183/101 HR 120 IV metoprolol order to give x1 and I will continue to monitor.

## 2017-02-28 ENCOUNTER — Encounter (HOSPITAL_COMMUNITY): Payer: Self-pay | Admitting: Emergency Medicine

## 2017-02-28 ENCOUNTER — Emergency Department (HOSPITAL_COMMUNITY)
Admission: EM | Admit: 2017-02-28 | Discharge: 2017-03-01 | Disposition: A | Discharge status: Home / Self Care | Payer: Medicare Other | Attending: Emergency Medicine | Admitting: Emergency Medicine

## 2017-02-28 DIAGNOSIS — Z79899 Other long term (current) drug therapy: Secondary | ICD-10-CM | POA: Diagnosis not present

## 2017-02-28 DIAGNOSIS — R51 Headache: Secondary | ICD-10-CM | POA: Insufficient documentation

## 2017-02-28 DIAGNOSIS — I110 Hypertensive heart disease with heart failure: Secondary | ICD-10-CM | POA: Diagnosis not present

## 2017-02-28 DIAGNOSIS — R112 Nausea with vomiting, unspecified: Secondary | ICD-10-CM | POA: Diagnosis not present

## 2017-02-28 DIAGNOSIS — F17210 Nicotine dependence, cigarettes, uncomplicated: Secondary | ICD-10-CM | POA: Diagnosis not present

## 2017-02-28 DIAGNOSIS — I5022 Chronic systolic (congestive) heart failure: Secondary | ICD-10-CM | POA: Diagnosis not present

## 2017-02-28 DIAGNOSIS — R197 Diarrhea, unspecified: Secondary | ICD-10-CM | POA: Diagnosis not present

## 2017-02-28 DIAGNOSIS — M791 Myalgia, unspecified site: Secondary | ICD-10-CM | POA: Insufficient documentation

## 2017-02-28 LAB — COMPREHENSIVE METABOLIC PANEL
ALT: 26 U/L (ref 14–54)
ANION GAP: 13 (ref 5–15)
AST: 53 U/L — ABNORMAL HIGH (ref 15–41)
Albumin: 3.7 g/dL (ref 3.5–5.0)
Alkaline Phosphatase: 87 U/L (ref 38–126)
BUN: 11 mg/dL (ref 6–20)
CHLORIDE: 103 mmol/L (ref 101–111)
CO2: 19 mmol/L — ABNORMAL LOW (ref 22–32)
CREATININE: 0.68 mg/dL (ref 0.44–1.00)
Calcium: 8.3 mg/dL — ABNORMAL LOW (ref 8.9–10.3)
Glucose, Bld: 110 mg/dL — ABNORMAL HIGH (ref 65–99)
POTASSIUM: 3.7 mmol/L (ref 3.5–5.1)
SODIUM: 135 mmol/L (ref 135–145)
Total Bilirubin: 1.5 mg/dL — ABNORMAL HIGH (ref 0.3–1.2)
Total Protein: 7.1 g/dL (ref 6.5–8.1)

## 2017-02-28 LAB — I-STAT BETA HCG BLOOD, ED (MC, WL, AP ONLY): I-stat hCG, quantitative: <5 m[IU]/mL (ref <5)

## 2017-02-28 LAB — CBC
HEMATOCRIT: 37.8 % (ref 36.0–46.0)
HEMOGLOBIN: 11.5 g/dL — AB (ref 12.0–15.0)
MCH: 17.9 pg — ABNORMAL LOW (ref 26.0–34.0)
MCHC: 30.4 g/dL (ref 30.0–36.0)
MCV: 58.8 fL — ABNORMAL LOW (ref 78.0–100.0)
PLATELETS: 329 10*3/uL (ref 150–400)
RBC: 6.43 MIL/uL — AB (ref 3.87–5.11)
RDW: 16.8 % — ABNORMAL HIGH (ref 11.5–15.5)
WBC: 11.8 10*3/uL — AB (ref 4.0–10.5)

## 2017-02-28 LAB — LIPASE, BLOOD: LIPASE: 21 U/L (ref 11–51)

## 2017-02-28 MED ORDER — ONDANSETRON HCL 4 MG/2ML IJ SOLN
4.0000 mg | Freq: Once | INTRAMUSCULAR | Status: AC | PRN
  Administered 2017-02-28: 4 mg via INTRAVENOUS
  Filled 2017-02-28: qty 2

## 2017-02-28 MED ORDER — KETOROLAC TROMETHAMINE 30 MG/ML IJ SOLN
30.0000 mg | Freq: Once | INTRAMUSCULAR | Status: AC
  Administered 2017-02-28: 30 mg via INTRAVENOUS
  Filled 2017-02-28: qty 1

## 2017-02-28 MED ORDER — HYDRALAZINE HCL 50 MG PO TABS: 100.0000 mg | ORAL_TABLET | Freq: Once | ORAL | Status: DC

## 2017-02-28 MED ORDER — CARVEDILOL 12.5 MG PO TABS: 25.0000 mg | ORAL_TABLET | Freq: Two times a day (BID) | ORAL | Status: DC

## 2017-02-28 MED ORDER — HYDRALAZINE HCL 20 MG/ML IJ SOLN
20.0000 mg | Freq: Once | INTRAMUSCULAR | Status: AC
  Administered 2017-02-28: 20 mg via INTRAVENOUS
  Filled 2017-02-28: qty 1

## 2017-02-28 MED ORDER — MORPHINE SULFATE (PF) 4 MG/ML IV SOLN
4.0000 mg | Freq: Once | INTRAVENOUS | Status: AC
  Administered 2017-03-01: 4 mg via INTRAVENOUS
  Filled 2017-02-28: qty 1

## 2017-02-28 NOTE — ED Notes (Signed)
Pt brought back to room D36. Pt had already used the bathroom all over herself and needed to be cleaned up. Pt wanted son to clean her up and she babbled like a baby the whole time.  Pt now resting in clean bed and gown.

## 2017-02-28 NOTE — ED Provider Notes (Signed)
[facility] EMERGENCY DEPARTMENT Provider Note   CSN: [id number] Arrival date & time: 02/28/17  2140     History   Chief Complaint Chief Complaint  Patient presents with  . Emesis    HPI Kelly Novak is a 47 y.o. female with a past medical history of hypertension, s/p R sided AKA, obesity, CHF, who presents to ED for evaluation of 1 day history of NBNB emesis and diarrhea.  She reports 3-4 episodes of each since this morning.  She also reports generalized body aches.  She states that every time she tries to eat anything including her home medications, she ends up vomiting.  Sick contacts include granddaughter with similar symptoms who she was taking care of for the past 3 days.  She did not take any medications prior to arrival to help with her symptoms.  She denies any URI symptoms, urinary symptoms, hematemesis, hematochezia, melena, chest pain, shortness of breath, abdominal pain.  HPI  Past Medical History:  Diagnosis Date  . Anemia   . Anxiety   . Arthritis   . Bipolar 1 disorder ([place])   . Blood dyscrasia    sickle cell trait  . Chronic back pain   . Chronic systolic (congestive) heart failure ([place])   . GERD (gastroesophageal reflux disease)   . Hypertension   . Obesity   . Polysubstance abuse ([place])   . Shortness of breath    on exertion due to weight  . Stroke [facility])     Patient Active Problem List   Diagnosis Date Noted  . AKI (acute kidney injury) ([place])   . Nausea   . Cocaine abuse ([place])   . Marijuana abuse   . Morbid obesity ([place])   . Hypertensive urgency 10/14/2016  . S/P AKA (above knee amputation) unilateral, right ([place]) 09/17/2016  . Bipolar disorder ([place]) 08/31/2016  . History of stroke 08/07/2016  . Polysubstance abuse ([place])   . Chronic systolic (congestive) heart failure ([place])   . Depression 11/03/2015  . GERD (gastroesophageal reflux disease) 11/03/2015  . Pain in the abdomen 11/03/2015  . Chronic back pain   . Uterine  fibroids 08/10/2013  . ASCUS pap with negative HRHPV 08/10/2013  . Pes planus (flat feet) 09/02/2012  . Anemia 09/02/2012  . Smoker 09/02/2012    Past Surgical History:  Procedure Laterality Date  . AMPUTATION Right 09/04/2016   Procedure: Right Above Knee Amputation;  Surgeon: Newt Minion, MD;  Location: [facility];  Service: Orthopedics;  Laterality: Right;  . CESAREAN SECTION    . CHOLECYSTECTOMY    . HYSTEROSCOPY N/A 08/23/2013   Procedure: HYSTEROSCOPY D&C WITH HYDROTHERMAL ABLATION;  Surgeon: Osborne Oman, MD;  Location: [facility];  Service: Gynecology;  Laterality: N/A;  . I&D EXTREMITY Right 09/02/2016   Procedure: IRRIGATION AND DEBRIDEMENT EXTREMITY;  Surgeon: Newt Minion, MD;  Location: [facility];  Service: Orthopedics;  Laterality: Right;  . TUBAL LIGATION      OB History    Gravida Para Term Preterm AB Living   4 4 4     2    SAB TAB Ectopic Multiple Live Births           4       Home Medications    Prior to Admission medications   Medication Sig Start Date End Date Taking? Authorizing Provider  acetaminophen (TYLENOL) 325 MG tablet Take 2 tablets (650 mg total) by mouth every 6 (six) hours as needed for mild pain (or  Fever >/= 101). 02/08/17   Rosita Fire, MD  Aspirin-Salicylamide-Caffeine [facility] HEADACHE POWDER PO) Take 1 packet by mouth 2 (two) times daily as needed (pain/headache).    [provider]  carvedilol (COREG) 25 MG tablet Take 1 tablet (25 mg total) by mouth 2 (two) times daily with a meal. 02/08/17   Rosita Fire, MD  cloNIDine (CATAPRES) 0.2 MG tablet Take 1 tablet (0.2 mg total) by mouth 3 (three) times daily. 02/08/17   Rosita Fire, MD  diphenhydrAMINE (BENADRYL) 25 mg capsule Take 50 mg by mouth at bedtime.     [provider]  gabapentin (NEURONTIN) 600 MG tablet Take 0.5 tablets (300 mg total) by mouth 2 (two) times daily. 02/08/17   Rosita Fire, MD  hydrALAZINE (APRESOLINE) 100 MG tablet Take 1  tablet (100 mg total) by mouth every 8 (eight) hours. 02/08/17   Rosita Fire, MD  Menthol, Topical Analgesic, (BENGAY EX) Apply 1 application topically 2 (two) times daily as needed (joint pain).    [provider]  ondansetron (ZOFRAN ODT) 4 MG disintegrating tablet Take 1 tablet (4 mg total) by mouth every 8 (eight) hours as needed for nausea or vomiting. 03/01/17   Nneoma Harral, PA-C  ranitidine (ZANTAC) 150 MG tablet Take 150 mg by mouth 2 (two) times daily before a meal.     [provider]    Family History Family History  Problem Relation Age of Onset  . CAD Father   . Heart failure Brother   . Hypertension Brother   . CAD Paternal Aunt     Social History Social History   Tobacco Use  . Smoking status: Current Every Day Smoker    Packs/day: 0.50    Years: 14.00    Pack years: 7.00    Types: Cigarettes  . Smokeless tobacco: Never Used  Substance Use Topics  . Alcohol use: No  . Drug use: Yes    Types: Cocaine    Comment: last use 10/11/16     Allergies   Tape   Review of Systems Review of Systems  Constitutional: Positive for fatigue. Negative for appetite change, chills and fever.  HENT: Negative for ear pain, rhinorrhea, sneezing and sore throat.   Eyes: Negative for photophobia and visual disturbance.  Respiratory: Negative for cough, chest tightness, shortness of breath and wheezing.   Cardiovascular: Negative for chest pain and palpitations.  Gastrointestinal: Positive for diarrhea, nausea and vomiting. Negative for abdominal pain, blood in stool and constipation.  Genitourinary: Negative for dysuria, hematuria and urgency.  Musculoskeletal: Positive for myalgias.  Skin: Negative for rash.  Neurological: Negative for dizziness, weakness, light-headedness and headaches.     Physical Exam Updated Vital Signs BP (!) 189/94   Pulse (!) 112   Temp 99.5 F (37.5 C) (Oral)   Resp 20   LMP 08/06/2013   SpO2 100%   Physical  Exam  Constitutional: She appears well-developed and well-nourished. No distress.  Appears uncomfortable. Obese.  HENT:  Head: Normocephalic and atraumatic.  Nose: Nose normal.  Eyes: Conjunctivae and EOM are normal. Right eye exhibits no discharge. Left eye exhibits no discharge. No scleral icterus.  Neck: Normal range of motion. Neck supple.  Cardiovascular: Normal rate, regular rhythm, normal heart sounds and intact distal pulses. Exam reveals no gallop and no friction rub.  No murmur heard. Pulmonary/Chest: Effort normal and breath sounds normal. No respiratory distress.  Abdominal: Soft. Bowel sounds are normal. She exhibits no distension. There  is no tenderness. There is no guarding.  Musculoskeletal: Normal range of motion. She exhibits no edema.  R sided AKA. Generalized TTP of body.  Neurological: She is alert. No cranial nerve deficit or sensory deficit. She exhibits normal muscle tone. Coordination normal.  Pupils reactive. No facial asymmetry noted. Cranial nerves appear grossly intact. Sensation intact to light touch on face, BUE and BLE. Strength 5/5 in BUE and BLE.  Skin: Skin is warm and dry. No rash noted.  Psychiatric: She has a normal mood and affect.  Nursing note and vitals reviewed.    ED Treatments / Results  Labs (all labs ordered are listed, but only abnormal results are displayed) Labs Reviewed  COMPREHENSIVE METABOLIC PANEL - Abnormal; Notable for the following components:      Result Value   CO2 19 (*)    Glucose, Bld 110 (*)    Calcium 8.3 (*)    AST 53 (*)    Total Bilirubin 1.5 (*)    All other components within normal limits  CBC - Abnormal; Notable for the following components:   WBC 11.8 (*)    RBC 6.43 (*)    Hemoglobin 11.5 (*)    MCV 58.8 (*)    MCH 17.9 (*)    RDW 16.8 (*)    All other components within normal limits  LIPASE, BLOOD  URINALYSIS, ROUTINE W REFLEX MICROSCOPIC  I-STAT BETA HCG BLOOD, ED ([facility], [facility], [facility] ONLY)    EKG  EKG  Interpretation None       Radiology No results found.  Procedures Procedures (including critical care time)  Medications Ordered in ED Medications  carvedilol (COREG) tablet 25 mg (25 mg Oral Given 03/01/17 0359)  ondansetron (ZOFRAN) injection 4 mg (4 mg Intravenous Given 02/28/17 2216)  ketorolac (TORADOL) 30 MG/ML injection 30 mg (30 mg Intravenous Given 02/28/17 2244)  hydrALAZINE (APRESOLINE) injection 20 mg (20 mg Intravenous Given 02/28/17 2244)  morphine 4 MG/ML injection 4 mg (4 mg Intravenous Given 03/01/17 0005)  acetaminophen (TYLENOL) tablet 650 mg (650 mg Oral Given 03/01/17 0102)  gi cocktail (Maalox,Lidocaine,Donnatal) (30 mLs Oral Given 03/01/17 0143)  haloperidol lactate (HALDOL) injection 5 mg (5 mg Intravenous Given 03/01/17 0211)     Initial Impression / Assessment and Plan / ED Course  I have reviewed the triage vital signs and the nursing notes.  Pertinent labs & imaging results that were available during my care of the patient were reviewed by me and considered in my medical decision making (see chart for details).     Patient, with a past medical history of hypertension, obesity, CHF, presents to ED for evaluation of 1 day history of NBNB emesis and diarrhea.  Reports 3-4 episodes of each since this morning.  Also reports generalized body aches.  Whenever she tries to eat anything at home including her home medications, shins or vomiting.  Sick contacts include granddaughter with similar symptoms she was taking care of for the past 2 days.  She denies any chest pain, abdominal pain, shortness of breath, vision changes, injuries or falls, urinary symptoms, hematemesis, hematochezia or melena.  On physical exam patient is obese.  She is able to follow commands.  She has diffuse tenderness to palpation of the entire body but no specific abdominal palpation or chest tenderness to palpation.  She is hypertensive to low 200s over 120s on arrival.  She did not take her home  medications this morning due to her vomiting.  Her lab work including [place],  lipase was unremarkable.  CBC with a leukocytosis at 11.8 which could be reactive.  HCG was negative.  Patient given Toradol, IV hydralazine, morphine and Zofran with improvement in her nausea.  She states that she continues to have generalized body aches.  Her hypertension did improve to 170s over 80s.  However, patient requested a GI cocktail.  After this she had more vomiting which could be due to the fact that she drink about 2 cans of soda after her nausea was controlled.  Her blood pressure increased to 989 systolic again.  Patient then given Haldol with improvement in her nausea.  She continues to say that she is having body aches.  I gave her Tylenol to help with this as well.  I have low suspicion for endorgan damage caused by hypertension or acute intra-abdominal abnormality being the cause of her symptoms.  I suspect a viral illness.  Given an oral dose of her Coreg with improvement in her blood pressures to 211H systolic.  Advised her to take Tylenol or ibuprofen as needed at home for body aches and Zofran as needed for nausea.  Advised to follow-up at [facility] for further evaluation if symptoms persist.  Patient appears stable for discharge at this time.  Strict return precautions given.  Patient discussed with and seen by Dr. Rex Kras.  Final Clinical Impressions(s) / ED Diagnoses   Final diagnoses:  Non-intractable vomiting with nausea, unspecified vomiting type  Nausea vomiting and diarrhea    ED Discharge Orders        Ordered    ondansetron (ZOFRAN ODT) 4 MG disintegrating tablet  Every 8 hours PRN     03/01/17 0302     Portions of this note were generated with Dragon dictation software. Dictation errors may occur despite best attempts at proofreading.    Delia Heady, PA-C 03/01/17 4174    Little, Wenda Overland, MD 03/01/17 2144

## 2017-02-28 NOTE — ED Notes (Signed)
Pt resting at present; informed son at bedside of pain medication if patient may need it; pt appears comfortable, blood pressure decreasing

## 2017-02-28 NOTE — ED Triage Notes (Addendum)
Pt transported from home via EMS c/o diarrhea, emesis and body aches x 1 day. Per EMS pt's grandson recently sick.   Pt reports emesis x 3, diarrhea x 3 today. Pt states she has defecated on herself on arrival.

## 2017-03-01 DIAGNOSIS — R112 Nausea with vomiting, unspecified: Secondary | ICD-10-CM | POA: Diagnosis not present

## 2017-03-01 MED ORDER — HALOPERIDOL LACTATE 5 MG/ML IJ SOLN
5.0000 mg | Freq: Once | INTRAMUSCULAR | Status: AC
  Administered 2017-03-01: 5 mg via INTRAVENOUS
  Filled 2017-03-01: qty 1

## 2017-03-01 MED ORDER — ONDANSETRON 4 MG PO TBDP: 4.0000 mg | ORAL_TABLET | Freq: Three times a day (TID) | ORAL | 0 refills | Status: DC | PRN

## 2017-03-01 MED ORDER — ACETAMINOPHEN 325 MG PO TABS
650.0000 mg | ORAL_TABLET | Freq: Once | ORAL | Status: AC
  Administered 2017-03-01: 650 mg via ORAL

## 2017-03-01 MED ORDER — CARVEDILOL 12.5 MG PO TABS
25.0000 mg | ORAL_TABLET | Freq: Two times a day (BID) | ORAL | Status: DC
  Administered 2017-03-01: 25 mg via ORAL
  Filled 2017-03-01: qty 2

## 2017-03-01 MED ORDER — GI COCKTAIL ~~LOC~~
30.0000 mL | Freq: Once | ORAL | Status: AC
  Administered 2017-03-01: 30 mL via ORAL
  Filled 2017-03-01: qty 30

## 2017-03-01 NOTE — ED Notes (Signed)
Pt tolerating Sprite at present.

## 2017-03-01 NOTE — ED Notes (Signed)
Pt very warm to touch, oral temp 99.3, appears flushed. C/o headache and generalized body aches. Reports her grandchild has been sick as well but no one else

## 2017-03-01 NOTE — Discharge Instructions (Signed)
Please read attached information regarding your condition. Take Zofran as needed for nausea. Return to ED for worsening symptoms, severe abdominal pain, chest pain, loss of consciousness.

## 2017-03-01 NOTE — ED Notes (Signed)
Pt vomited 882ml of fluid, provider to be made aware

## 2017-03-01 NOTE — ED Notes (Signed)
Explained discharged paperwork and prescription (for zofran) to patient. Pt expressed understanding, requesting pain medication. Encouraged pt to take ibuprofen and tylenol at home as needed for pain

## 2017-03-01 NOTE — ED Notes (Signed)
Hina, PA made aware of emesis

## 2017-03-01 NOTE — ED Notes (Signed)
Pt continues to complain of generalized bodyaches and abd pain; also dry heaving at present but requesting apple juice. Informed pt of NPO status at this time due to vomiting

## 2017-03-26 ENCOUNTER — Telehealth (INDEPENDENT_AMBULATORY_CARE_PROVIDER_SITE_OTHER): Payer: Self-pay

## 2017-03-26 NOTE — Telephone Encounter (Signed)
Patient would like a call back concerning her PT.  Cb# is [phone].  Please advise.  Thank you.

## 2017-03-29 ENCOUNTER — Other Ambulatory Visit (INDEPENDENT_AMBULATORY_CARE_PROVIDER_SITE_OTHER): Payer: Self-pay

## 2017-03-29 NOTE — Telephone Encounter (Signed)
Called pt and she was requesting an rx to [facility]. 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

## 2017-04-27 ENCOUNTER — Telehealth: Payer: Self-pay | Admitting: Physical Therapy

## 2017-04-27 ENCOUNTER — Other Ambulatory Visit (INDEPENDENT_AMBULATORY_CARE_PROVIDER_SITE_OTHER): Payer: Self-pay | Admitting: Orthopedic Surgery

## 2017-04-27 DIAGNOSIS — Z89619 Acquired absence of unspecified leg above knee: Secondary | ICD-10-CM

## 2017-04-27 NOTE — Telephone Encounter (Signed)
Dr. Sharol Given I received a note from your office about Kelly Novak. Can you please place a referral in Epic so I can schedule an evaluation for her? Thank you  Shirlean Mylar

## 2017-05-11 ENCOUNTER — Other Ambulatory Visit: Payer: Self-pay

## 2017-05-11 ENCOUNTER — Ambulatory Visit: Payer: Medicare Other | Attending: Orthopedic Surgery | Admitting: Physical Therapy

## 2017-05-11 ENCOUNTER — Encounter: Payer: Self-pay | Admitting: Physical Therapy

## 2017-05-11 DIAGNOSIS — R2681 Unsteadiness on feet: Secondary | ICD-10-CM | POA: Insufficient documentation

## 2017-05-11 DIAGNOSIS — R2689 Other abnormalities of gait and mobility: Secondary | ICD-10-CM | POA: Insufficient documentation

## 2017-05-11 DIAGNOSIS — M6281 Muscle weakness (generalized): Secondary | ICD-10-CM | POA: Diagnosis not present
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
## 2017-05-13 ENCOUNTER — Encounter (HOSPITAL_COMMUNITY): Payer: Self-pay | Admitting: *Deleted

## 2017-05-13 ENCOUNTER — Emergency Department (HOSPITAL_COMMUNITY)
Admission: EM | Admit: 2017-05-13 | Discharge: 2017-05-13 | Disposition: A | Discharge status: Home / Self Care | Payer: Medicare Other | Attending: Emergency Medicine | Admitting: Emergency Medicine

## 2017-05-13 DIAGNOSIS — I110 Hypertensive heart disease with heart failure: Secondary | ICD-10-CM | POA: Diagnosis not present

## 2017-05-13 DIAGNOSIS — H5712 Ocular pain, left eye: Secondary | ICD-10-CM | POA: Diagnosis present

## 2017-05-13 DIAGNOSIS — I5022 Chronic systolic (congestive) heart failure: Secondary | ICD-10-CM | POA: Insufficient documentation

## 2017-05-13 DIAGNOSIS — Z79899 Other long term (current) drug therapy: Secondary | ICD-10-CM | POA: Insufficient documentation

## 2017-05-13 DIAGNOSIS — F17210 Nicotine dependence, cigarettes, uncomplicated: Secondary | ICD-10-CM | POA: Diagnosis not present

## 2017-05-13 DIAGNOSIS — H00015 Hordeolum externum left lower eyelid: Secondary | ICD-10-CM | POA: Diagnosis not present

## 2017-05-13 DIAGNOSIS — H1032 Unspecified acute conjunctivitis, left eye: Secondary | ICD-10-CM

## 2017-05-13 MED ORDER — CARVEDILOL 12.5 MG PO TABS
25.0000 mg | ORAL_TABLET | Freq: Once | ORAL | Status: AC
  Administered 2017-05-13: 25 mg via ORAL
  Filled 2017-05-13: qty 2

## 2017-05-13 MED ORDER — IBUPROFEN 400 MG PO TABS
600.0000 mg | ORAL_TABLET | Freq: Once | ORAL | Status: AC
  Administered 2017-05-13: 11:00:00 600 mg via ORAL
  Filled 2017-05-13: qty 1

## 2017-05-13 MED ORDER — ACETAMINOPHEN 325 MG PO TABS
650.0000 mg | ORAL_TABLET | Freq: Once | ORAL | Status: DC
  Filled 2017-05-13: qty 2

## 2017-05-13 MED ORDER — SULFACETAMIDE SODIUM 10 % OP SOLN: 1.0000 [drp] | OPHTHALMIC | 0 refills | Status: DC

## 2017-05-13 MED ORDER — CLONIDINE HCL 0.2 MG PO TABS
0.2000 mg | ORAL_TABLET | Freq: Once | ORAL | Status: AC
  Administered 2017-05-13: 0.2 mg via ORAL
  Filled 2017-05-13: qty 1

## 2017-05-13 NOTE — ED Provider Notes (Signed)
[facility] EMERGENCY DEPARTMENT Provider Note   CSN: [id number] Arrival date & time: 05/13/17  8242     History   Chief Complaint Chief Complaint  Patient presents with  . Eye Pain    HPI Kelly Novak is a 47 y.o. female with a past medical history of bipolar disorder, hypertension, polysubstance abuse, obesity, who presents to ED for evaluation of 2-day history of left eye itching, purulent and clear drainage, irritation.  No sick contacts with similar symptoms.  Cannot recall any inciting event that may have triggered the symptoms.  She states that she is having irritation to the entire left side of her face as a result of it.  Denies any pain with eye movements, fever, proptosis, swelling around the eye, vision changes, contact lens use, foreign body sensation.  HPI  Past Medical History:  Diagnosis Date  . Anemia   . Anxiety   . Arthritis   . Bipolar 1 disorder ([place])   . Blood dyscrasia    sickle cell trait  . Chronic back pain   . Chronic systolic (congestive) heart failure ([place])   . GERD (gastroesophageal reflux disease)   . Hypertension   . Obesity   . Polysubstance abuse ([place])   . Shortness of breath    on exertion due to weight  . Stroke [facility])     Patient Active Problem List   Diagnosis Date Noted  . AKI (acute kidney injury) ([place])   . Nausea   . Cocaine abuse ([place])   . Marijuana abuse   . Morbid obesity ([place])   . Hypertensive urgency 10/14/2016  . S/P AKA (above knee amputation) unilateral, right ([place]) 09/17/2016  . Bipolar disorder ([place]) 08/31/2016  . History of stroke 08/07/2016  . Polysubstance abuse ([place])   . Chronic systolic (congestive) heart failure ([place])   . Depression 11/03/2015  . GERD (gastroesophageal reflux disease) 11/03/2015  . Pain in the abdomen 11/03/2015  . Chronic back pain   . Uterine fibroids 08/10/2013  . ASCUS pap with negative HRHPV 08/10/2013  . Pes planus (flat feet) 09/02/2012  . Anemia 09/02/2012    . Smoker 09/02/2012    Past Surgical History:  Procedure Laterality Date  . AMPUTATION Right 09/04/2016   Procedure: Right Above Knee Amputation;  Surgeon: Newt Minion, MD;  Location: [facility];  Service: Orthopedics;  Laterality: Right;  . CESAREAN SECTION    . CHOLECYSTECTOMY    . HYSTEROSCOPY N/A 08/23/2013   Procedure: HYSTEROSCOPY D&C WITH HYDROTHERMAL ABLATION;  Surgeon: Osborne Oman, MD;  Location: [facility];  Service: Gynecology;  Laterality: N/A;  . I&D EXTREMITY Right 09/02/2016   Procedure: IRRIGATION AND DEBRIDEMENT EXTREMITY;  Surgeon: Newt Minion, MD;  Location: [facility];  Service: Orthopedics;  Laterality: Right;  . TUBAL LIGATION      OB History    Gravida  4   Para  4   Term  4   Preterm      AB      Living  2     SAB      TAB      Ectopic      Multiple      Live Births  4            Home Medications    Prior to Admission medications   Medication Sig Start Date End Date Taking? Authorizing Provider  acetaminophen (TYLENOL) 325 MG tablet Take 2 tablets (650 mg total) by mouth every  6 (six) hours as needed for mild pain (or Fever >/= 101). 02/08/17   Rosita Fire, MD  Aspirin-Salicylamide-Caffeine [facility] HEADACHE POWDER PO) Take 1 packet by mouth 2 (two) times daily as needed (pain/headache).    [provider]  carvedilol (COREG) 25 MG tablet Take 1 tablet (25 mg total) by mouth 2 (two) times daily with a meal. 02/08/17   Rosita Fire, MD  cloNIDine (CATAPRES) 0.2 MG tablet Take 1 tablet (0.2 mg total) by mouth 3 (three) times daily. 02/08/17   Rosita Fire, MD  diphenhydrAMINE (BENADRYL) 25 mg capsule Take 50 mg by mouth at bedtime.     [provider]  gabapentin (NEURONTIN) 600 MG tablet Take 0.5 tablets (300 mg total) by mouth 2 (two) times daily. 02/08/17   Rosita Fire, MD  hydrALAZINE (APRESOLINE) 100 MG tablet Take 1 tablet (100 mg total) by mouth every 8 (eight) hours. 02/08/17    Rosita Fire, MD  Menthol, Topical Analgesic, (BENGAY EX) Apply 1 application topically 2 (two) times daily as needed (joint pain).    [provider]  ondansetron (ZOFRAN ODT) 4 MG disintegrating tablet Take 1 tablet (4 mg total) by mouth every 8 (eight) hours as needed for nausea or vomiting. 03/01/17   Larrell Rapozo, PA-C  ranitidine (ZANTAC) 150 MG tablet Take 150 mg by mouth 2 (two) times daily before a meal.     [provider]  sulfacetamide (BLEPH-10) 10 % ophthalmic solution Place 1-2 drops into the left eye every 4 (four) hours. 05/13/17   Delia Heady, PA-C    Family History Family History  Problem Relation Age of Onset  . CAD Father   . Heart failure Brother   . Hypertension Brother   . CAD Paternal Aunt     Social History Social History   Tobacco Use  . Smoking status: Current Every Day Smoker    Packs/day: 0.50    Years: 14.00    Pack years: 7.00    Types: Cigarettes  . Smokeless tobacco: Never Used  Substance Use Topics  . Alcohol use: No  . Drug use: Yes    Types: Cocaine    Comment: last use 10/11/16     Allergies   Tape   Review of Systems Review of Systems  Constitutional: Negative for chills and fever.  HENT: Negative for facial swelling.   Eyes: Positive for discharge, redness and itching. Negative for photophobia, pain and visual disturbance.     Physical Exam Updated Vital Signs BP (!) 180/104   Pulse 73   Temp 98.8 F (37.1 C) (Oral)   Resp 20   LMP 08/06/2013   SpO2 100%   Physical Exam  Constitutional: She appears well-developed and well-nourished. No distress.  HENT:  Head: Normocephalic and atraumatic.  Eyes: Pupils are equal, round, and reactive to light. EOM are normal. Left eye exhibits hordeolum. Left conjunctiva is injected. No scleral icterus. Right eye exhibits normal extraocular motion. Left eye exhibits normal extraocular motion.  Left eye with injected conjunctiva, mild lower eyelid edema. No  erythema or tenderness to palpation.  Mild clear tearful drainage noted.  No foreign bodies noted.  No pain with EOMs.  No chemosis, proptosis, or consensual photophobia. Normal right eye.  Neck: Normal range of motion.  Cardiovascular: Normal rate, regular rhythm and normal heart sounds.  Pulmonary/Chest: Effort normal and breath sounds normal. No respiratory distress.  Neurological: She is alert.  Skin: No rash noted. She is not diaphoretic.  Psychiatric: She has a normal mood and affect.  Nursing note and vitals reviewed.    ED Treatments / Results  Labs (all labs ordered are listed, but only abnormal results are displayed) Labs Reviewed - No data to display  EKG  EKG Interpretation None       Radiology No results found.  Procedures Procedures (including critical care time)  Medications Ordered in ED Medications  cloNIDine (CATAPRES) tablet 0.2 mg (0.2 mg Oral Given 05/13/17 1047)  carvedilol (COREG) tablet 25 mg (25 mg Oral Given 05/13/17 1047)  ibuprofen (ADVIL,MOTRIN) tablet 600 mg (600 mg Oral Given 05/13/17 1120)     Initial Impression / Assessment and Plan / ED Course  I have reviewed the triage vital signs and the nursing notes.  Pertinent labs & imaging results that were available during my care of the patient were reviewed by me and considered in my medical decision making (see chart for details).  Clinical Course as of May 14 1206  Thu May 13, 2017  1208 Blood pressure improved to 154/88 on recheck.   [HK]    Clinical Course User Index [HK] Delia Heady, PA-C    Patient presents to ED for evaluation of symptoms consistent with conjunctivitis of the left eye for the past 2 days including achiness, purulent and tearful drainage, swelling of the lower eyelid.  Denies any vision changes, contact lens use, trauma to area, foreign body sensation.  There is evidence of lower eyelid stye and conjunctivitis.  Patient was initially hypertensive to 214/113.  She did  not take her home blood pressure medications this morning.  She is given a dose of these medications as well as ibuprofen for her discomfort with improvement in her blood pressure to 154/88 on my recheck.  She is resting comfortably with no signs of hypertensive emergency. Patient's blood pressure initially elevated in the emergency department today. Patient denies headache, change in vision, numbness, weakness, chest pain, dyspnea, dizziness, or lightheadedness therefore doubt hypertensive emergency. Discussed elevated blood pressure with the patient and the need for primary care follow up with potential need to initiate or change antihypertensive medications and or for further evaluation. Discussed return precaution signs/symptoms for hypertensive emergency as listed above with the patient.  Will give eyedrops, advised to follow-up with PCP and ophthalmologist for further evaluation if symptoms persist.  No concern of keratitis, corneal abrasion, iritis on examination.  Patient appears stable for discharge at this time.  Strict return precautions given.  Final Clinical Impressions(s) / ED Diagnoses   Final diagnoses:  Acute conjunctivitis of left eye, unspecified acute conjunctivitis type  Hordeolum externum of left lower eyelid    ED Discharge Orders        Ordered    sulfacetamide (BLEPH-10) 10 % ophthalmic solution  Every 4 hours     05/13/17 1012       Delia Heady, PA-C 05/13/17 1211    Carmin Muskrat, MD 05/13/17 1656

## 2017-05-13 NOTE — ED Triage Notes (Signed)
To ED via [facility] for eval of eye pain, itching, and drainage.

## 2017-05-25 ENCOUNTER — Ambulatory Visit: Payer: Medicare Other | Attending: Orthopedic Surgery | Admitting: Physical Therapy

## 2017-05-28 ENCOUNTER — Telehealth: Payer: Self-pay | Admitting: Physical Therapy

## 2017-05-28 ENCOUNTER — Ambulatory Visit: Payer: Medicare Other | Admitting: Physical Therapy

## 2017-05-28 NOTE — Telephone Encounter (Signed)
Called patient about her missed appointment today and last time as well. Left a message on her cell voice mail regarding the missed appointments and the date/time of her next appointment. Also left a reminder of our no show policy and that one more no show would result in all her appointments being removed.  Left our office phone number for her to call if she has any concerns or questions.   Willow Ora, PTA, [facility] [address], [place], [place] [address] [phone] 05/28/17, 11:28 AM

## 2017-06-01 ENCOUNTER — Ambulatory Visit: Payer: Medicare Other | Admitting: Physical Therapy

## 2017-06-04 ENCOUNTER — Ambulatory Visit: Payer: Medicare Other | Admitting: Physical Therapy

## 2017-06-07 ENCOUNTER — Ambulatory Visit: Payer: Medicare Other | Admitting: Physical Therapy

## 2017-06-08 ENCOUNTER — Telehealth: Payer: Self-pay | Admitting: Physical Therapy

## 2017-06-08 NOTE — Telephone Encounter (Signed)
PT called and left message regarding no show policy. If no return call on 06/07/2017 that she will be discharged due to >3 no shows.

## 2017-06-09 ENCOUNTER — Ambulatory Visit: Payer: Medicare Other | Admitting: Physical Therapy

## 2017-06-14 ENCOUNTER — Encounter: Payer: Self-pay | Admitting: Physical Therapy

## 2017-06-17 ENCOUNTER — Encounter: Payer: Self-pay | Admitting: Physical Therapy

## 2017-06-21 ENCOUNTER — Encounter: Payer: Self-pay | Admitting: Physical Therapy

## 2017-06-24 ENCOUNTER — Encounter: Payer: Self-pay | Admitting: Physical Therapy

## 2017-06-28 ENCOUNTER — Encounter: Payer: Self-pay | Admitting: Physical Therapy

## 2017-07-01 ENCOUNTER — Encounter: Payer: Self-pay | Admitting: Physical Therapy

## 2017-07-07 ENCOUNTER — Encounter: Payer: Self-pay | Admitting: Physical Therapy

## 2017-07-12 ENCOUNTER — Encounter: Payer: Self-pay | Admitting: Physical Therapy

## 2017-07-15 ENCOUNTER — Encounter: Payer: Self-pay | Admitting: Physical Therapy

## 2017-08-05 ENCOUNTER — Encounter: Payer: Self-pay | Admitting: Physical Therapy

## 2017-08-05 NOTE — Therapy (Signed)
[facility] [address] [place], [place], [address] Phone: [phone]   Fax:  [phone]  Patient Details  Name: Kelly Novak MRN: [id number] Date of Birth: [birth year] Referring Provider:  Meridee Score, MD  Encounter Date: 08/05/2017  This patient was evaluated for PT on 05/11/2017. She did not return for any PT sessions after evaluation. PT is discharging due to attendance.   Chares Slaymaker PT, DPT 08/05/2017, 8:54 AM  [facility] [address] [place], [place], [address] Phone: [phone]   Fax:  [phone]

## 2017-08-31 ENCOUNTER — Ambulatory Visit (HOSPITAL_COMMUNITY)
Admission: EM | Admit: 2017-08-31 | Discharge: 2017-08-31 | Disposition: A | Discharge status: Home / Self Care | Payer: Medicare Other | Attending: Family Medicine | Admitting: Family Medicine

## 2017-08-31 ENCOUNTER — Encounter (HOSPITAL_COMMUNITY): Payer: Self-pay | Admitting: Emergency Medicine

## 2017-08-31 DIAGNOSIS — Z760 Encounter for issue of repeat prescription: Secondary | ICD-10-CM | POA: Diagnosis not present

## 2017-08-31 DIAGNOSIS — I10 Essential (primary) hypertension: Secondary | ICD-10-CM

## 2017-08-31 MED ORDER — CLONIDINE HCL 0.3 MG PO TABS: 0.3000 mg | ORAL_TABLET | Freq: Three times a day (TID) | ORAL | 0 refills | Status: DC

## 2017-08-31 MED ORDER — METOPROLOL SUCCINATE ER 100 MG PO TB24: 100.0000 mg | ORAL_TABLET | Freq: Every day | ORAL | 0 refills | Status: DC

## 2017-08-31 MED ORDER — CARVEDILOL 25 MG PO TABS: 25.0000 mg | ORAL_TABLET | Freq: Two times a day (BID) | ORAL | 0 refills | Status: DC

## 2017-08-31 MED ORDER — HYDRALAZINE HCL 100 MG PO TABS: 100.0000 mg | ORAL_TABLET | Freq: Three times a day (TID) | ORAL | 0 refills | Status: DC

## 2017-08-31 NOTE — Discharge Instructions (Signed)
Medications refilled, follow up with new PCP  Your blood pressure was elevated today in clinic. Please be sure to take blood pressure medications as prescribed. Please monitor your blood pressure at home or when you go to a [facility]. Please follow up with your primary care doctor to recheck blood pressure and discuss any need for medication changes.   Please go to Emergency Room if you start to experience severe headache, vision changes, decreased urine production, chest pain, shortness of breath, speech slurring, one sided weakness.

## 2017-08-31 NOTE — ED Triage Notes (Signed)
PT ran out of BP med 2 days ago and missed PCP appt

## 2017-09-01 NOTE — ED Provider Notes (Signed)
[facility]    CSN: [id number] Arrival date & time: 08/31/17  1727     History   Chief Complaint Chief Complaint  Patient presents with  . Medication Refill    HPI Kelly Novak is a 47 y.o. female history of hypertension, arthritis, heart failure, tobacco use presenting today for medication refill.  Patient states that she is out of her hyper tension medicines.  She has been out of her clonidine for approximately 2 days.  She denies any symptoms related to this including headache, vision changes, difficulty speaking, chest pain, shortness of breath, decreased urine.  Notes that she urinates frequently.  She does not monitor her blood pressure at home as her battery is out of her machine.  She states that it typically runs high.  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  HPI  Past Medical History:  Diagnosis Date  . Anemia   . Anxiety   . Arthritis   . Bipolar 1 disorder ([place])   . Blood dyscrasia    sickle cell trait  . Chronic back pain   . Chronic systolic (congestive) heart failure ([place])   . GERD (gastroesophageal reflux disease)   . Hypertension   . Obesity   . Polysubstance abuse ([place])   . Shortness of breath    on exertion due to weight  . Stroke [facility])     Patient Active Problem List   Diagnosis Date Noted  . AKI (acute kidney injury) ([place])   . Nausea   . Cocaine abuse ([place])   . Marijuana abuse   . Morbid obesity ([place])   . Hypertensive urgency 10/14/2016  . 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 Bipolar disorder ([place]) 08/31/2016  . History of stroke 08/07/2016  . Polysubstance abuse ([place])   . Chronic systolic (congestive) heart failure ([place])   . Depression 11/03/2015  . GERD (gastroesophageal reflux disease) 11/03/2015  . Pain in the abdomen 11/03/2015  . Chronic back pain   . Uterine fibroids 08/10/2013  . ASCUS pap with negative HRHPV 08/10/2013  .  Pes planus (flat feet) 09/02/2012  . Anemia 09/02/2012  . Smoker 09/02/2012    Past Surgical History:  Procedure Laterality Date  . AMPUTATION Right 09/04/2016   Procedure: Right Above Knee Amputation;  Surgeon: Newt Minion, MD;  Location: [facility];  Service: Orthopedics;  Laterality: Right;  . CESAREAN SECTION    . CHOLECYSTECTOMY    . HYSTEROSCOPY N/A 08/23/2013   Procedure: HYSTEROSCOPY D&C WITH HYDROTHERMAL ABLATION;  Surgeon: Osborne Oman, MD;  Location: [facility];  Service: Gynecology;  Laterality: N/A;  . I&D EXTREMITY Right 09/02/2016   Procedure: IRRIGATION AND DEBRIDEMENT EXTREMITY;  Surgeon: Newt Minion, MD;  Location: [facility];  Service: Orthopedics;  Laterality: Right;  . TUBAL LIGATION      OB History    Gravida  4   Para  4   Term  4   Preterm      AB      Living  2     SAB      TAB      Ectopic      Multiple      Live Births  4            Home Medications    Prior to Admission medications   Medication Sig Start Date End Date Taking? Authorizing Provider  acetaminophen (TYLENOL) 325 MG tablet Take 2 tablets (650 mg total) by mouth every 6 (six) hours as needed for mild pain (or Fever >/= 101). 02/08/17   Rosita Fire, MD  Aspirin-Salicylamide-Caffeine [facility] HEADACHE POWDER PO) Take 1 packet by mouth 2 (two) times daily as needed (pain/headache).    [provider]  carvedilol (COREG) 25 MG tablet Take 1 tablet (25 mg total) by mouth 2 (two) times daily with a meal. 08/31/17   Wieters, Hallie C, PA-C  cloNIDine (CATAPRES) 0.3 MG tablet Take 1 tablet (0.3 mg total) by mouth 3 (three) times daily. 08/31/17 09/30/17  Wieters, Hallie C, PA-C  diphenhydrAMINE (BENADRYL) 25 mg capsule Take 50 mg by mouth at bedtime.     [provider]  gabapentin (NEURONTIN) 600 MG tablet Take 0.5 tablets (300 mg total) by mouth 2 (two) times daily. 02/08/17   Rosita Fire, MD  hydrALAZINE (APRESOLINE) 100 MG tablet Take 1 tablet (100 mg  total) by mouth every 8 (eight) hours. 08/31/17   Wieters, Hallie C, PA-C  Menthol, Topical Analgesic, (BENGAY EX) Apply 1 application topically 2 (two) times daily as needed (joint pain).    [provider]  metoprolol succinate (TOPROL-XL) 100 MG 24 hr tablet Take 1 tablet (100 mg total) by mouth daily. Take with or immediately following a meal. 08/31/17   Wieters, Hallie C, PA-C  ondansetron (ZOFRAN ODT) 4 MG disintegrating tablet Take 1 tablet (4 mg total) by mouth every 8 (eight) hours as needed for nausea or vomiting. 03/01/17   Khatri, Hina, PA-C  ranitidine (ZANTAC) 150 MG tablet Take 150 mg by mouth 2 (two) times daily before a meal.     [provider]  sulfacetamide (BLEPH-10) 10 % ophthalmic solution Place 1-2 drops into the left eye every 4 (four) hours. 05/13/17   Delia Heady, PA-C    Family History Family History  Problem Relation Age of Onset  . CAD Father   . Heart failure Brother   . Hypertension Brother   . CAD Paternal Aunt     Social History Social History   Tobacco Use  . Smoking status: Current Every Day Smoker    Packs/day: 0.50    Years: 14.00    Pack years: 7.00    Types: Cigarettes  . Smokeless tobacco: Never Used  Substance Use Topics  . Alcohol use: No  . Drug use: Yes    Types: Cocaine    Comment: last use 10/11/16     Allergies   Tape   Review of Systems Review of Systems  Constitutional: Negative for fatigue and fever.  HENT: Negative for congestion, sinus pressure and sore throat.   Eyes: Negative for photophobia, pain and visual disturbance.  Respiratory: Negative for cough and shortness of breath.   Cardiovascular: Negative for chest pain.  Gastrointestinal: Negative for abdominal pain, nausea and vomiting.  Genitourinary: Negative for decreased urine volume and hematuria.  Musculoskeletal: Negative for myalgias, neck pain and neck stiffness.  Neurological: Negative for dizziness, syncope, facial asymmetry, speech  difficulty, weakness, light-headedness, numbness and headaches.     Physical Exam Triage Vital Signs ED Triage Vitals  Enc Vitals Group     BP 08/31/17 1744 (!) 190/125     Pulse Rate 08/31/17 1744 79     Resp 08/31/17 1744 16     Temp 08/31/17 1744 99.1 F (37.3 C)     Temp Source 08/31/17 1744 Oral     SpO2 08/31/17 1744 98 %  Weight 08/31/17 1745 (!) 305 lb (138.3 kg)     Height --      Head Circumference --      Peak Flow --      Pain Score 08/31/17 1745 0     Pain Loc --      Pain Edu? --      Excl. in [place]? --    No data found.  Updated Vital Signs BP (!) 190/125   Pulse 79   Temp 99.1 F (37.3 C) (Oral)   Resp 16   Wt (!) 305 lb (138.3 kg)   LMP 08/06/2013   SpO2 98%   BMI 47.77 kg/m   Visual Acuity Right Eye Distance:   Left Eye Distance:   Bilateral Distance:    Right Eye Near:   Left Eye Near:    Bilateral Near:     Physical Exam  Constitutional: She is oriented to person, place, and time. She appears well-developed and well-nourished.  No acute distress; in wheelchair  HENT:  Head: Normocephalic and atraumatic.  Nose: Nose normal.  Raspy voice  Eyes: Pupils are equal, round, and reactive to light. Conjunctivae and EOM are normal.  Neck: Neck supple.  Cardiovascular: Normal rate.  Pulmonary/Chest: Effort normal and breath sounds normal. No respiratory distress.  Breathing comfortably at rest, CTABL, no wheezing, rales or other adventitious sounds auscultated  Abdominal: She exhibits no distension.  Musculoskeletal: Normal range of motion.  Neurological: She is alert and oriented to person, place, and time.  Skin: Skin is warm and dry.  Psychiatric: She has a normal mood and affect.  Nursing note and vitals reviewed.    UC Treatments / Results  Labs (all labs ordered are listed, but only abnormal results are displayed) Labs Reviewed - No data to display  EKG None  Radiology No results found.  Procedures Procedures (including  critical care time)  Medications Ordered in UC Medications - No data to display  Initial Impression / Assessment and Plan / UC Course  I have reviewed the triage vital signs and the nursing notes.  Pertinent labs & imaging results that were available during my care of the patient were reviewed by me and considered in my medical decision making (see chart for details).     Patient hypertensive in clinic today, 190/125.  She is asymptomatic, given her history it sounds like she typically runs elevated.  Will re-initiate blood pressure medicines.  Follow-up with PCP for further evaluation and management of blood pressure.  Advised her to continue to monitor her blood pressure as well as symptoms, discussed signs and symptoms concerning for elevated blood pressure as well as stroke and MI.Discussed strict return precautions. Patient verbalized understanding and is agreeable with plan.  Final Clinical Impressions(s) / UC Diagnoses   Final diagnoses:  Medication refill  Essential hypertension     Discharge Instructions     Medications refilled, follow up with new PCP  Your blood pressure was elevated today in clinic. Please be sure to take blood pressure medications as prescribed. Please monitor your blood pressure at home or when you go to a [facility]/Gym. Please follow up with your primary care doctor to recheck blood pressure and discuss any need for medication changes.   Please go to Emergency Room if you start to experience severe headache, vision changes, decreased urine production, chest pain, shortness of breath, speech slurring, one sided weakness.   ED Prescriptions    Medication Sig Dispense Auth. Provider   hydrALAZINE (APRESOLINE)  100 MG tablet Take 1 tablet (100 mg total) by mouth every 8 (eight) hours. 90 tablet Wieters, Hallie C, PA-C   cloNIDine (CATAPRES) 0.3 MG tablet Take 1 tablet (0.3 mg total) by mouth 3 (three) times daily. 90 tablet Wieters, Hallie C, PA-C    carvedilol (COREG) 25 MG tablet Take 1 tablet (25 mg total) by mouth 2 (two) times daily with a meal. 90 tablet Wieters, Hallie C, PA-C   metoprolol succinate (TOPROL-XL) 100 MG 24 hr tablet Take 1 tablet (100 mg total) by mouth daily. Take with or immediately following a meal. 60 tablet Wieters, Hallie C, PA-C     Controlled Substance Prescriptions [place] Controlled Substance Registry consulted? Not Applicable   Janith Lima, [place] 09/01/17 [phone]

## 2017-09-22 ENCOUNTER — Emergency Department (HOSPITAL_COMMUNITY)
Admission: EM | Admit: 2017-09-22 | Discharge: 2017-09-22 | Disposition: A | Discharge status: Home / Self Care | Payer: Medicare Other | Attending: Emergency Medicine | Admitting: Emergency Medicine

## 2017-09-22 ENCOUNTER — Other Ambulatory Visit: Payer: Self-pay

## 2017-09-22 ENCOUNTER — Emergency Department (HOSPITAL_COMMUNITY): Payer: Medicare Other

## 2017-09-22 ENCOUNTER — Encounter (HOSPITAL_COMMUNITY): Payer: Self-pay

## 2017-09-22 DIAGNOSIS — I110 Hypertensive heart disease with heart failure: Secondary | ICD-10-CM | POA: Diagnosis not present

## 2017-09-22 DIAGNOSIS — I5022 Chronic systolic (congestive) heart failure: Secondary | ICD-10-CM | POA: Insufficient documentation

## 2017-09-22 DIAGNOSIS — I10 Essential (primary) hypertension: Secondary | ICD-10-CM

## 2017-09-22 DIAGNOSIS — M25562 Pain in left knee: Secondary | ICD-10-CM | POA: Insufficient documentation

## 2017-09-22 DIAGNOSIS — IMO0002 Reserved for concepts with insufficient information to code with codable children: Secondary | ICD-10-CM

## 2017-09-22 LAB — CBC WITH DIFFERENTIAL/PLATELET
BASOS PCT: 1 %
Basophils Absolute: 0.1 10*3/uL (ref 0.0–0.1)
EOS PCT: 5 %
Eosinophils Absolute: 0.4 10*3/uL (ref 0.0–0.7)
HEMATOCRIT: 38.9 % (ref 36.0–46.0)
HEMOGLOBIN: 11.2 g/dL — AB (ref 12.0–15.0)
LYMPHS PCT: 23 %
Lymphs Abs: 1.9 10*3/uL (ref 0.7–4.0)
MCH: 18.5 pg — AB (ref 26.0–34.0)
MCHC: 28.8 g/dL — ABNORMAL LOW (ref 30.0–36.0)
MCV: 64.1 fL — AB (ref 78.0–100.0)
Monocytes Absolute: 0.5 10*3/uL (ref 0.1–1.0)
Monocytes Relative: 6 %
NEUTROS ABS: 5.3 10*3/uL (ref 1.7–7.7)
NEUTROS PCT: 65 %
Platelets: 371 10*3/uL (ref 150–400)
RBC: 6.07 MIL/uL — ABNORMAL HIGH (ref 3.87–5.11)
RDW: 20.4 % — ABNORMAL HIGH (ref 11.5–15.5)
WBC: 8.2 10*3/uL (ref 4.0–10.5)

## 2017-09-22 LAB — COMPREHENSIVE METABOLIC PANEL
ALBUMIN: 3.8 g/dL (ref 3.5–5.0)
ALT: 13 U/L (ref 0–44)
ANION GAP: 9 (ref 5–15)
AST: 16 U/L (ref 15–41)
Alkaline Phosphatase: 85 U/L (ref 38–126)
BUN: 10 mg/dL (ref 6–20)
CO2: 25 mmol/L (ref 22–32)
Calcium: 8.6 mg/dL — ABNORMAL LOW (ref 8.9–10.3)
Chloride: 106 mmol/L (ref 98–111)
Creatinine, Ser: 0.75 mg/dL (ref 0.44–1.00)
GFR calc non Af Amer: >60 mL/min (ref >60)
GLUCOSE: 95 mg/dL (ref 70–99)
Potassium: 4.1 mmol/L (ref 3.5–5.1)
SODIUM: 140 mmol/L (ref 135–145)
Total Bilirubin: 1 mg/dL (ref 0.3–1.2)
Total Protein: 6.8 g/dL (ref 6.5–8.1)

## 2017-09-22 MED ORDER — HYDRALAZINE HCL 100 MG PO TABS: 100.0000 mg | ORAL_TABLET | Freq: Three times a day (TID) | ORAL | 1 refills | Status: AC

## 2017-09-22 MED ORDER — CLONIDINE HCL 0.3 MG PO TABS: 0.3000 mg | ORAL_TABLET | Freq: Three times a day (TID) | ORAL | 1 refills | Status: DC

## 2017-09-22 MED ORDER — METOPROLOL SUCCINATE ER 100 MG PO TB24: 100.0000 mg | ORAL_TABLET | Freq: Every day | ORAL | 1 refills | Status: DC

## 2017-09-22 MED ORDER — CARVEDILOL 25 MG PO TABS: 25.0000 mg | ORAL_TABLET | Freq: Two times a day (BID) | ORAL | 1 refills | Status: DC

## 2017-09-22 MED ORDER — HYDROCODONE-ACETAMINOPHEN 5-325 MG PO TABS
2.0000 | ORAL_TABLET | Freq: Once | ORAL | Status: AC
  Administered 2017-09-22: 2 via ORAL
  Filled 2017-09-22: qty 2

## 2017-09-22 MED ORDER — CLONIDINE HCL 0.2 MG PO TABS
0.2000 mg | ORAL_TABLET | Freq: Once | ORAL | Status: AC
  Administered 2017-09-22: 0.2 mg via ORAL
  Filled 2017-09-22: qty 1

## 2017-09-22 NOTE — ED Triage Notes (Signed)
Pt states that she had a fall in March and "has been hurting ever since" c/o left knee pain, back pain, and hypertension. States that she had lost her BP cuff, but "it has felt high"

## 2017-09-22 NOTE — ED Provider Notes (Signed)
[facility] EMERGENCY DEPARTMENT Provider Note   CSN: [id number] Arrival date & time: 09/22/17  1703     History   Chief Complaint Chief Complaint  Patient presents with  . Knee Pain  . Hypertension    HPI Kelly Novak is a 47 y.o. female.  The history is provided by the patient. No language interpreter was used.  Knee Pain   This is a recurrent problem. The current episode started more than 1 week ago. The problem occurs constantly. The problem has been gradually worsening. The quality of the pain is described as aching. The pain is moderate. Pertinent negatives include no numbness. She has tried nothing for the symptoms. The treatment provided no relief. There has been no history of extremity trauma.  Hypertension   Pt is out of her blood pressure medications.  Pt reports she is having pain in her left knee.  Pt has had an AKA on her right side.  Pt reports she needs home medical equipment.    Past Medical History:  Diagnosis Date  . Anemia   . Anxiety   . Arthritis   . Bipolar 1 disorder ([place])   . Blood dyscrasia    sickle cell trait  . Chronic back pain   . Chronic systolic (congestive) heart failure ([place])   . GERD (gastroesophageal reflux disease)   . Hypertension   . Obesity   . Polysubstance abuse ([place])   . Shortness of breath    on exertion due to weight  . Stroke [facility])     Patient Active Problem List   Diagnosis Date Noted  . AKI (acute kidney injury) ([place])   . Nausea   . Cocaine abuse ([place])   . Marijuana abuse   . Morbid obesity ([place])   . Hypertensive urgency 10/14/2016  . 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 Bipolar disorder ([place]) 08/31/2016  . History of stroke 08/07/2016  . Polysubstance abuse ([place])   . Chronic systolic (congestive) heart failure ([place])   . Depression 11/03/2015  . GERD (gastroesophageal reflux disease) 11/03/2015  . Pain in the abdomen 11/03/2015  . Chronic back pain   .  Uterine fibroids 08/10/2013  . ASCUS pap with negative HRHPV 08/10/2013  . Pes planus (flat feet) 09/02/2012  . Anemia 09/02/2012  . Smoker 09/02/2012    Past Surgical History:  Procedure Laterality Date  . AMPUTATION Right 09/04/2016   Procedure: Right Above Knee Amputation;  Surgeon: Newt Minion, MD;  Location: [facility];  Service: Orthopedics;  Laterality: Right;  . CESAREAN SECTION    . CHOLECYSTECTOMY    . HYSTEROSCOPY N/A 08/23/2013   Procedure: HYSTEROSCOPY D&C WITH HYDROTHERMAL ABLATION;  Surgeon: Osborne Oman, MD;  Location: [facility];  Service: Gynecology;  Laterality: N/A;  . I&D EXTREMITY Right 09/02/2016   Procedure: IRRIGATION AND DEBRIDEMENT EXTREMITY;  Surgeon: Newt Minion, MD;  Location: [facility];  Service: Orthopedics;  Laterality: Right;  . TUBAL LIGATION       OB History    Gravida  4   Para  4   Term  4   Preterm      AB      Living  2     SAB      TAB      Ectopic      Multiple      Live Births  4            Home Medications  Prior to Admission medications   Medication Sig Start Date End Date Taking? Authorizing Provider  carvedilol (COREG) 25 MG tablet Take 1 tablet (25 mg total) by mouth 2 (two) times daily with a meal. 08/31/17  Yes Wieters, Hallie C, PA-C  cloNIDine (CATAPRES) 0.3 MG tablet Take 1 tablet (0.3 mg total) by mouth 3 (three) times daily. 08/31/17 09/30/17 Yes Wieters, Hallie C, PA-C  hydrALAZINE (APRESOLINE) 100 MG tablet Take 1 tablet (100 mg total) by mouth every 8 (eight) hours. 08/31/17  Yes Wieters, Hallie C, PA-C  metoprolol succinate (TOPROL-XL) 100 MG 24 hr tablet Take 1 tablet (100 mg total) by mouth daily. Take with or immediately following a meal. 08/31/17  Yes Wieters, Hallie C, PA-C  naproxen sodium (ALEVE) 220 MG tablet Take 220-440 mg by mouth 2 (two) times daily as needed (pain).   Yes [provider]  ranitidine (ZANTAC) 150 MG tablet Take 150 mg by mouth 2 (two) times daily before a meal.    Yes [provider]  acetaminophen (TYLENOL) 325 MG tablet Take 2 tablets (650 mg total) by mouth every 6 (six) hours as needed for mild pain (or Fever >/= 101). Patient not taking: Reported on 09/22/2017 02/08/17   Rosita Fire, MD  gabapentin (NEURONTIN) 600 MG tablet Take 0.5 tablets (300 mg total) by mouth 2 (two) times daily. Patient not taking: Reported on 09/22/2017 02/08/17   Rosita Fire, MD  ondansetron (ZOFRAN ODT) 4 MG disintegrating tablet Take 1 tablet (4 mg total) by mouth every 8 (eight) hours as needed for nausea or vomiting. Patient not taking: Reported on 09/22/2017 03/01/17   Delia Heady, PA-C  sulfacetamide (BLEPH-10) 10 % ophthalmic solution Place 1-2 drops into the left eye every 4 (four) hours. Patient not taking: Reported on 09/22/2017 05/13/17   Delia Heady, PA-C    Family History Family History  Problem Relation Age of Onset  . CAD Father   . Heart failure Brother   . Hypertension Brother   . CAD Paternal Aunt     Social History Social History   Tobacco Use  . Smoking status: Current Every Day Smoker    Packs/day: 0.50    Years: 14.00    Pack years: 7.00    Types: Cigarettes  . Smokeless tobacco: Never Used  Substance Use Topics  . Alcohol use: No  . Drug use: Yes    Types: Marijuana     Allergies   Tape   Review of Systems Review of Systems  Neurological: Negative for numbness.  All other systems reviewed and are negative.    Physical Exam Updated Vital Signs BP (!) 140/101   Pulse 74   Temp 98.3 F (36.8 C) (Oral)   Resp 13   Ht 5\' 8"  (1.727 m)   Wt (!) 138.3 kg (305 lb)   LMP 08/06/2013   SpO2 100%   BMI 46.38 kg/m   Physical Exam  Constitutional: She appears well-developed and well-nourished.  HENT:  Head: Normocephalic.  Right Ear: External ear normal.  Left Ear: External ear normal.  Eyes: Pupils are equal, round, and reactive to light. EOM are normal.  Neck: Normal range of motion.  Cardiovascular: Normal  rate.  Pulmonary/Chest: Effort normal.  Musculoskeletal: She exhibits tenderness.  Swollen tender left knee, pain with movement  nv and ns intact   Neurological: She is alert.  Skin: Skin is warm.  Psychiatric: She has a normal mood and affect.  Nursing note and vitals reviewed.  ED Treatments / Results  Labs (all labs ordered are listed, but only abnormal results are displayed) Labs Reviewed  COMPREHENSIVE METABOLIC PANEL - Abnormal; Notable for the following components:      Result Value   Calcium 8.6 (*)    All other components within normal limits  CBC WITH DIFFERENTIAL/PLATELET - Abnormal; Notable for the following components:   RBC 6.07 (*)    Hemoglobin 11.2 (*)    MCV 64.1 (*)    MCH 18.5 (*)    MCHC 28.8 (*)    RDW 20.4 (*)    All other components within normal limits    EKG None  Radiology Dg Knee Complete 4 Views Left  Result Date: 09/22/2017 CLINICAL DATA:  Fall in bathtub today. Knee pain. Initial encounter. EXAM: LEFT KNEE - COMPLETE 4+ VIEW COMPARISON:  Left knee radiographs 06/07/2011. FINDINGS: Progressive age advanced degenerative changes are present in the knee. There is progressive loss of height and osteophyte formation involving the medial compartment. Progressive patellofemoral degenerative changes are present. The knee is located. No acute bone or soft tissue abnormality is present. IMPRESSION: 1. No acute abnormality. 2. Progressive degenerative changes involving the medial and patellofemoral compartments of the knee. Electronically Signed   By: San Morelle M.D.   On: 09/22/2017 19:05    Procedures Procedures (including critical care time)  Medications Ordered in ED Medications  cloNIDine (CATAPRES) tablet 0.2 mg (0.2 mg Oral Given 09/22/17 2119)  HYDROcodone-acetaminophen (NORCO/VICODIN) 5-325 MG per tablet 2 tablet (2 tablets Oral Given 09/22/17 2119)     Initial Impression / Assessment and Plan / ED Course  I have reviewed the  triage vital signs and the nursing notes.  Pertinent labs & imaging results that were available during my care of the patient were reviewed by me and considered in my medical decision making (see chart for details).         durable medical equipment  Pt needs durable medical equipment.  Pt has had an amputaion of right leg and arthritis to left knee    Final Clinical Impressions(s) / ED Diagnoses   Final diagnoses:  None    ED Discharge Orders    None       Sidney Ace 09/22/17 2352    Mesner, Corene Cornea, MD 09/24/17 [id number]

## 2017-09-22 NOTE — Discharge Instructions (Addendum)
Follow up with primary care for management of blood pressure

## 2017-09-23 ENCOUNTER — Telehealth: Payer: Self-pay

## 2017-09-23 NOTE — Telephone Encounter (Signed)
Message received from Laurena Slimmer, RN CM requesting a hospital follow up appointment for the patient at [facility]. Informed her that there are currently not any appointments available.  The patient will need to call [facility] to check availability. The phone # for [facility] is on her ED AVS

## 2017-09-29 ENCOUNTER — Ambulatory Visit (INDEPENDENT_AMBULATORY_CARE_PROVIDER_SITE_OTHER): Payer: Medicare Other | Admitting: Family Medicine

## 2017-09-29 ENCOUNTER — Encounter: Payer: Self-pay | Admitting: Family Medicine

## 2017-09-29 VITALS — BP 148/98 | HR 84 | Temp 98.6°F | Ht 68.0 in

## 2017-09-29 DIAGNOSIS — Z0000 Encounter for general adult medical examination without abnormal findings: Secondary | ICD-10-CM

## 2017-09-29 DIAGNOSIS — M25562 Pain in left knee: Secondary | ICD-10-CM

## 2017-09-29 DIAGNOSIS — M25511 Pain in right shoulder: Secondary | ICD-10-CM

## 2017-09-29 DIAGNOSIS — R7309 Other abnormal glucose: Secondary | ICD-10-CM

## 2017-09-29 DIAGNOSIS — G8929 Other chronic pain: Secondary | ICD-10-CM | POA: Diagnosis not present

## 2017-09-29 DIAGNOSIS — Z131 Encounter for screening for diabetes mellitus: Secondary | ICD-10-CM

## 2017-09-29 DIAGNOSIS — M25512 Pain in left shoulder: Secondary | ICD-10-CM

## 2017-09-29 DIAGNOSIS — Z09 Encounter for follow-up examination after completed treatment for conditions other than malignant neoplasm: Secondary | ICD-10-CM | POA: Diagnosis not present

## 2017-09-29 DIAGNOSIS — Z89511 Acquired absence of right leg below knee: Secondary | ICD-10-CM

## 2017-09-29 MED ORDER — KETOROLAC TROMETHAMINE 60 MG/2ML IM SOLN
60.0000 mg | Freq: Once | INTRAMUSCULAR | Status: AC
Start: 2017-09-29 — End: 2017-09-29
  Administered 2017-09-29: 60 mg via INTRAMUSCULAR

## 2017-09-29 MED ORDER — TRAMADOL HCL 50 MG PO TABS: 50.0000 mg | ORAL_TABLET | Freq: Three times a day (TID) | ORAL | 0 refills | Status: DC | PRN

## 2017-09-29 NOTE — Progress Notes (Signed)
New Patient-Establish Care  Subjective:    Patient ID: Kelly Novak, female    DOB: 09-Apr-1970, 47 y.o.   MRN: [id number]  HPI  Kelly Novak has a past medical history of Stroke, Shortness of Breath, Obesity, Polysubstance Abuse, Obesity, GERD, Chronic Back Pain, Arthritis, Anxiety, and Anemia. She is here today to establish care.   Current Status: Since her last office visit, she has c/o shoulder, back and knee pain. She also is requesting Kelly Novak to assist in ambulation and ADLs.   She denies fevers, chills, fatigue, recent infections, weight loss, and night sweats. She has not had any headaches, visual changes, dizziness, and falls. No chest pain, heart palpitations, cough and shortness of breath reported. No reports of GI problems such as nausea, vomiting, diarrhea, and constipation. She has no reports of blood in stools, dysuria and hematuria. No depression or anxiety, and denies suicidal ideations, homicidal ideations, or auditory hallucinations. She denies pain today.   Past Medical History:  Diagnosis Date  . Anemia   . Anxiety   . Arthritis   . Bipolar 1 disorder (Kelly Novak)   . Blood dyscrasia    sickle cell trait  . Chronic back pain   . Chronic systolic (congestive) heart failure ([place])   . GERD (gastroesophageal reflux disease)   . Hypertension   . Obesity   . Polysubstance abuse (Kelly Novak)   . Shortness of breath    on exertion due to weight  . Stroke Kelly Novak)     Family History  Problem Relation Age of Onset  . CAD Father   . Heart failure Brother   . Hypertension Brother   . CAD Paternal Aunt     Social History   Socioeconomic History  . Marital status: Widowed    Spouse name: Not on file  . Number of children: Not on file  . Years of education: Not on file  . Highest education level: Not on file  Occupational History  . Not on file  Social Needs  . Financial resource strain: Not on file  . Food insecurity:    Worry: Not on file    Inability: Not on  file  . Transportation needs:    Medical: Not on file    Non-medical: Not on file  Tobacco Use  . Smoking status: Current Every Day Smoker    Packs/day: 0.50    Years: 14.00    Pack years: 7.00    Types: Cigarettes  . Smokeless tobacco: Never Used  Substance and Sexual Activity  . Alcohol use: No  . Drug use: Yes    Types: Marijuana  . Sexual activity: Never    Birth control/protection: Surgical  Lifestyle  . Physical activity:    Days per week: Not on file    Minutes per session: Not on file  . Stress: Not on file  Relationships  . Social connections:    Talks on phone: Not on file    Gets together: Not on file    Attends religious service: Not on file    Active member of club or organization: Not on file    Attends meetings of clubs or organizations: Not on file    Relationship status: Not on file  . Intimate partner violence:    Fear of current or ex partner: Not on file    Emotionally abused: Not on file    Physically abused: Not on file    Forced sexual activity: Not on file  Other Topics Concern  .  Not on file  Social History Narrative  . Not on file    Past Surgical History:  Procedure Laterality Date  . AMPUTATION Right 09/04/2016   Procedure: Right Above Knee Amputation;  Surgeon: Newt Minion, MD;  Location: [facility];  Service: Orthopedics;  Laterality: Right;  . CESAREAN SECTION    . CHOLECYSTECTOMY    . HYSTEROSCOPY N/A 08/23/2013   Procedure: HYSTEROSCOPY D&C WITH HYDROTHERMAL ABLATION;  Surgeon: Osborne Oman, MD;  Location: [facility];  Service: Gynecology;  Laterality: N/A;  . I&D EXTREMITY Right 09/02/2016   Procedure: IRRIGATION AND DEBRIDEMENT EXTREMITY;  Surgeon: Newt Minion, MD;  Location: [facility];  Service: Orthopedics;  Laterality: Right;  . TUBAL LIGATION      Immunization History  Administered Date(s) Administered  . Pneumococcal Polysaccharide-23 09/01/2016  . Tdap 01/10/2014    Current Meds  Medication Sig  . carvedilol (COREG) 25 MG  tablet Take 1 tablet (25 mg total) by mouth 2 (two) times daily with a meal.  . cloNIDine (CATAPRES) 0.3 MG tablet Take 1 tablet (0.3 mg total) by mouth 3 (three) times daily.  . hydrALAZINE (APRESOLINE) 100 MG tablet Take 1 tablet (100 mg total) by mouth every 8 (eight) hours.  Marland Kitchen ibuprofen (ADVIL,MOTRIN) 200 MG tablet Take 200 mg by mouth every 6 (six) hours as needed.  . metoprolol succinate (TOPROL-XL) 100 MG 24 hr tablet Take 1 tablet (100 mg total) by mouth daily. Take with or immediately following a meal.  . ranitidine (ZANTAC) 150 MG tablet Take 150 mg by mouth 2 (two) times daily before a meal.     Allergies  Allergen Reactions  . Tape Itching and Rash    "took skin off" please use wrap or paper tape if possible    BP (!) 148/98   Pulse 84   Temp 98.6 F (37 C) (Oral)   Ht 5\' 8"  (1.727 m)   LMP 08/06/2013   SpO2 98%   BMI 46.38 kg/m    Review of Systems  Constitutional: Negative.   HENT: Negative.   Eyes: Negative.   Respiratory: Negative.   Cardiovascular: Negative.   Gastrointestinal: Positive for abdominal distention (obese).  Endocrine: Negative.   Genitourinary: Negative.   Musculoskeletal: Positive for arthralgias (shoulder and left knee pain) and back pain.  Skin: Negative.   Allergic/Immunologic: Negative.   Neurological: Positive for weakness.       S/p: right leg amputation with 'phantom' pain.    Hematological: Negative.   Psychiatric/Behavioral: Positive for agitation. The patient is nervous/anxious and is hyperactive.    Objective:   Physical Exam  Constitutional: She appears well-developed and well-nourished.  HENT:  Head: Normocephalic and atraumatic.  Right Ear: External ear normal.  Left Ear: External ear normal.  Mouth/Throat: Oropharynx is clear and moist.  Eyes: Pupils are equal, round, and reactive to light. Conjunctivae and EOM are normal.  Neck: Normal range of motion. Neck supple.  Cardiovascular: Normal rate, regular rhythm and  normal heart sounds.  Pulmonary/Chest: Effort normal and breath sounds normal.  Abdominal: Soft. Bowel sounds are normal.  Musculoskeletal:  Right leg amputation.  Left knee pain.  Skin: Skin is warm and dry. Capillary refill takes less than 2 seconds.  Psychiatric: She has a normal mood and affect. Her behavior is normal. Judgment and thought content normal.  Nursing note and vitals reviewed.  Assessment & Plan:   1. Hospital discharge follow-up  2. Encounter for medical examination to establish care  3. Health care  maintenance We will refer her to home health today.  - POCT glycosylated hemoglobin (Hb A1C)  4. Screening for diabetes mellitus - POCT glycosylated hemoglobin (Hb A1C)  5. Elevated glucose - POCT glycosylated hemoglobin (Hb A1C)  6. Chronic pain of left knee - ketorolac (TORADOL) injection 60 mg - traMADol (ULTRAM) 50 MG tablet; Take 1 tablet (50 mg total) by mouth every 8 (eight) hours as needed.  Dispense: 30 tablet; Refill: 0  7. Chronic pain of both shoulders We will initiate Ultram for pain.  - traMADol (ULTRAM) 50 MG tablet; Take 1 tablet (50 mg total) by mouth every 8 (eight) hours as needed.  Dispense: 30 tablet; Refill: 0  8. 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 Follow up She will follow up in 2 weeks.   Meds ordered this encounter  Medications  . ketorolac (TORADOL) injection 60 mg  . traMADol (ULTRAM) 50 MG tablet    Sig: Take 1 tablet (50 mg total) by mouth every 8 (eight) hours as needed.    Dispense:  30 tablet    Refill:  0    Order Specific Question:   Supervising Provider    Answer:   Tresa Garter [[id number]]    Referral Orders     Ambulatory referral to [facility],  MSN, [facility] [address] [place], [place] [address] [phone]

## 2017-09-29 NOTE — Patient Instructions (Signed)
Tramadol tablets What is this medicine? TRAMADOL (TRA ma dole) is a pain reliever. It is used to treat moderate to severe pain in adults. This medicine may be used for other purposes; ask your health care provider or pharmacist if you have questions. COMMON BRAND NAME(S): Ultram What should I tell my health care provider before I take this medicine? They need to know if you have any of these conditions: -brain tumor -depression -drug abuse or addiction -head injury -if you frequently drink alcohol containing drinks -kidney disease or trouble passing urine -liver disease -lung disease, asthma, or breathing problems -seizures or epilepsy -suicidal thoughts, plans, or attempt; a previous suicide attempt by you or a family member -an unusual or allergic reaction to tramadol, codeine, other medicines, foods, dyes, or preservatives -pregnant or trying to get pregnant -breast-feeding How should I use this medicine? Take this medicine by mouth with a full glass of water. Follow the directions on the prescription label. You can take it with or without food. If it upsets your stomach, take it with food. Do not take your medicine more often than directed. A special MedGuide will be given to you by the pharmacist with each prescription and refill. Be sure to read this information carefully each time. Talk to your pediatrician regarding the use of this medicine in children. Special care may be needed. Overdosage: If you think you have taken too much of this medicine contact a poison control center or emergency room at once. NOTE: This medicine is only for you. Do not share this medicine with others. What if I miss a dose? If you miss a dose, take it as soon as you can. If it is almost time for your next dose, take only that dose. Do not take double or extra doses. What may interact with this medicine? Do not take this medication with any of the following medicines: -MAOIs like Carbex, Eldepryl,  Marplan, Nardil, and Parnate This medicine may also interact with the following medications: -alcohol -antihistamines for allergy, cough and cold -certain medicines for anxiety or sleep -certain medicines for depression like amitriptyline, fluoxetine, sertraline -certain medicines for migraine headache like almotriptan, eletriptan, frovatriptan, naratriptan, rizatriptan, sumatriptan, zolmitriptan -certain medicines for seizures like carbamazepine, oxcarbazepine, phenobarbital, primidone -certain medicines that treat or prevent blood clots like warfarin -digoxin -furazolidone -general anesthetics like halothane, isoflurane, methoxyflurane, propofol -linezolid -local anesthetics like lidocaine, pramoxine, tetracaine -medicines that relax muscles for surgery -other narcotic medicines for pain or cough -phenothiazines like chlorpromazine, mesoridazine, prochlorperazine, thioridazine -procarbazine This list may not describe all possible interactions. Give your health care provider a list of all the medicines, herbs, non-prescription drugs, or dietary supplements you use. Also tell them if you smoke, drink alcohol, or use illegal drugs. Some items may interact with your medicine. What should I watch for while using this medicine? Tell your doctor or health care professional if your pain does not go away, if it gets worse, or if you have new or a different type of pain. You may develop tolerance to the medicine. Tolerance means that you will need a higher dose of the medicine for pain relief. Tolerance is normal and is expected if you take this medicine for a long time. Do not suddenly stop taking your medicine because you may develop a severe reaction. Your body becomes used to the medicine. This does NOT mean you are addicted. Addiction is a behavior related to getting and using a drug for a non-medical reason. If you have pain, you  have a medical reason to take pain medicine. Your doctor will tell  you how much medicine to take. If your doctor wants you to stop the medicine, the dose will be slowly lowered over time to avoid any side effects. There are different types of narcotic medicines (opiates). If you take more than one type at the same time or if you are taking another medicine that also causes drowsiness, you may have more side effects. Give your health care provider a list of all medicines you use. Your doctor will tell you how much medicine to take. Do not take more medicine than directed. Call emergency for help if you have problems breathing or unusual sleepiness. You may get drowsy or dizzy. Do not drive, use machinery, or do anything that needs mental alertness until you know how this medicine affects you. Do not stand or sit up quickly, especially if you are an older patient. This reduces the risk of dizzy or fainting spells. Alcohol can increase or decrease the effects of this medicine. Avoid alcoholic drinks. You may have constipation. Try to have a bowel movement at least every 2 to 3 days. If you do not have a bowel movement for 3 days, call your doctor or health care professional. Your mouth may get dry. Chewing sugarless gum or sucking hard candy, and drinking plenty of water may help. Contact your doctor if the problem does not go away or is severe. What side effects may I notice from receiving this medicine? Side effects that you should report to your doctor or health care professional as soon as possible: -allergic reactions like skin rash, itching or hives, swelling of the face, lips, or tongue -breathing problems -confusion -seizures -signs and symptoms of low blood pressure like dizziness; feeling faint or lightheaded, falls; unusually weak or tired -trouble passing urine or change in the amount of urine Side effects that usually do not require medical attention (report to your doctor or health care professional if they continue or are bothersome): -constipation -dry  mouth -nausea, vomiting -tiredness This list may not describe all possible side effects. Call your doctor for medical advice about side effects. You may report side effects to FDA at 1-800-FDA-1088. Where should I keep my medicine? Keep out of the reach of children. This medicine may cause accidental overdose and death if it taken by other adults, children, or pets. Mix any unused medicine with a substance like cat litter or coffee grounds. Then throw the medicine away in a sealed container like a sealed bag or a coffee can with a lid. Do not use the medicine after the expiration date. Store at room temperature between 15 and 30 degrees C (59 and 86 degrees F). NOTE: This sheet is a summary. It may not cover all possible information. If you have questions about this medicine, talk to your doctor, pharmacist, or health care provider.  2018 Elsevier/Gold Standard (2014-11-04 09:00:04)  

## 2017-10-02 NOTE — Addendum Note (Signed)
Addended by: Azzie Glatter on: 10/02/2017 06:06 PM   Modules accepted: Level of Service

## 2017-10-06 ENCOUNTER — Telehealth: Payer: Self-pay

## 2017-10-06 NOTE — Telephone Encounter (Signed)
They need order for physical therapy and home health aid.

## 2017-10-07 NOTE — Telephone Encounter (Signed)
Okay.Marland Kitchen..Marland Kitchenwill complete Rx for Home Health on 11/08/2017.

## 2017-10-13 ENCOUNTER — Ambulatory Visit: Payer: Self-pay | Admitting: Family Medicine

## 2017-10-14 NOTE — Telephone Encounter (Signed)
Completed on 10/14/2017. Will bring to office for you to fax to [facility] on 10/15/2017.    Thanks.

## 2017-10-16 ENCOUNTER — Telehealth: Payer: Self-pay | Admitting: Family Medicine

## 2017-10-16 NOTE — Telephone Encounter (Signed)
Voice message left for patient to please return call to office regarding paperwork received from [facility].

## 2017-10-29 ENCOUNTER — Encounter (HOSPITAL_COMMUNITY): Payer: Self-pay | Admitting: Emergency Medicine

## 2017-10-29 ENCOUNTER — Emergency Department (HOSPITAL_COMMUNITY)
Admission: EM | Admit: 2017-10-29 | Discharge: 2017-10-29 | Disposition: A | Discharge status: Home / Self Care | Payer: Medicare Other | Attending: Emergency Medicine | Admitting: Emergency Medicine

## 2017-10-29 ENCOUNTER — Emergency Department (HOSPITAL_COMMUNITY): Payer: Medicare Other

## 2017-10-29 DIAGNOSIS — M5431 Sciatica, right side: Secondary | ICD-10-CM | POA: Diagnosis not present

## 2017-10-29 DIAGNOSIS — I5022 Chronic systolic (congestive) heart failure: Secondary | ICD-10-CM | POA: Insufficient documentation

## 2017-10-29 DIAGNOSIS — Z79899 Other long term (current) drug therapy: Secondary | ICD-10-CM | POA: Diagnosis not present

## 2017-10-29 DIAGNOSIS — I110 Hypertensive heart disease with heart failure: Secondary | ICD-10-CM | POA: Diagnosis not present

## 2017-10-29 DIAGNOSIS — F17210 Nicotine dependence, cigarettes, uncomplicated: Secondary | ICD-10-CM | POA: Insufficient documentation

## 2017-10-29 DIAGNOSIS — Z8673 Personal history of transient ischemic attack (TIA), and cerebral infarction without residual deficits: Secondary | ICD-10-CM | POA: Diagnosis not present

## 2017-10-29 DIAGNOSIS — R1084 Generalized abdominal pain: Secondary | ICD-10-CM | POA: Diagnosis present

## 2017-10-29 LAB — URINALYSIS, ROUTINE W REFLEX MICROSCOPIC
Bilirubin Urine: NEGATIVE
GLUCOSE, UA: NEGATIVE mg/dL
Hgb urine dipstick: NEGATIVE
KETONES UR: NEGATIVE mg/dL
Nitrite: NEGATIVE
PROTEIN: 30 mg/dL — AB
Specific Gravity, Urine: 1.025 (ref 1.005–1.030)
pH: 6 (ref 5.0–8.0)

## 2017-10-29 LAB — CBC
HCT: 35.6 % — ABNORMAL LOW (ref 36.0–46.0)
HEMOGLOBIN: 10.4 g/dL — AB (ref 12.0–15.0)
MCH: 18.7 pg — ABNORMAL LOW (ref 26.0–34.0)
MCHC: 29.2 g/dL — AB (ref 30.0–36.0)
MCV: 64.1 fL — ABNORMAL LOW (ref 78.0–100.0)
Platelets: 314 10*3/uL (ref 150–400)
RBC: 5.55 MIL/uL — AB (ref 3.87–5.11)
RDW: 17 % — ABNORMAL HIGH (ref 11.5–15.5)
WBC: 9 10*3/uL (ref 4.0–10.5)

## 2017-10-29 LAB — BASIC METABOLIC PANEL
ANION GAP: 9 (ref 5–15)
BUN: 14 mg/dL (ref 6–20)
CALCIUM: 8.8 mg/dL — AB (ref 8.9–10.3)
CO2: 26 mmol/L (ref 22–32)
Chloride: 104 mmol/L (ref 98–111)
Creatinine, Ser: 0.64 mg/dL (ref 0.44–1.00)
GFR calc Af Amer: >60 mL/min (ref >60)
GLUCOSE: 105 mg/dL — AB (ref 70–99)
POTASSIUM: 3.8 mmol/L (ref 3.5–5.1)
Sodium: 139 mmol/L (ref 135–145)

## 2017-10-29 MED ORDER — LIDOCAINE 5 % EX PTCH: 1.0000 | MEDICATED_PATCH | CUTANEOUS | 0 refills | Status: DC

## 2017-10-29 MED ORDER — KETOROLAC TROMETHAMINE 30 MG/ML IJ SOLN
30.0000 mg | Freq: Once | INTRAMUSCULAR | Status: AC
  Administered 2017-10-29: 30 mg via INTRAMUSCULAR
  Filled 2017-10-29: qty 1

## 2017-10-29 MED ORDER — LIDOCAINE 5 % EX PTCH
2.0000 | MEDICATED_PATCH | CUTANEOUS | Status: DC
  Administered 2017-10-29: 2 via TRANSDERMAL
  Filled 2017-10-29: qty 2

## 2017-10-29 MED ORDER — AMLODIPINE BESYLATE 5 MG PO TABS
10.0000 mg | ORAL_TABLET | Freq: Once | ORAL | Status: AC
  Administered 2017-10-29: 10 mg via ORAL
  Filled 2017-10-29: qty 2

## 2017-10-29 NOTE — ED Provider Notes (Signed)
[facility] EMERGENCY DEPARTMENT Provider Note   CSN: [id number] Arrival date & time: 10/29/17  0126     History   Chief Complaint Chief Complaint  Patient presents with  . Flank Pain    HPI Kelly Novak is a 47 y.o. female.  The history is provided by the patient.  Flank Pain  This is a new (right buttock into posterior thigh) problem. The current episode started more than 1 week ago. The problem occurs constantly. The problem has not changed since onset.Pertinent negatives include no chest pain, no abdominal pain, no headaches and no shortness of breath. Nothing aggravates the symptoms. Nothing relieves the symptoms. She has tried nothing for the symptoms. The treatment provided no relief.    Past Medical History:  Diagnosis Date  . Anemia   . Anxiety   . Arthritis   . Bipolar 1 disorder ([place])   . Blood dyscrasia    sickle cell trait  . Chronic back pain   . Chronic systolic (congestive) heart failure ([place])   . GERD (gastroesophageal reflux disease)   . Hypertension   . Obesity   . Polysubstance abuse ([place])   . Shortness of breath    on exertion due to weight  . Stroke [facility])     Patient Active Problem List   Diagnosis Date Noted  . AKI (acute kidney injury) ([place])   . Nausea   . Cocaine abuse ([place])   . Marijuana abuse   . Morbid obesity ([place])   . Hypertensive urgency 10/14/2016  . 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 Bipolar disorder ([place]) 08/31/2016  . History of stroke 08/07/2016  . Polysubstance abuse ([place])   . Chronic systolic (congestive) heart failure ([place])   . Depression 11/03/2015  . GERD (gastroesophageal reflux disease) 11/03/2015  . Pain in the abdomen 11/03/2015  . Chronic back pain   . Uterine fibroids 08/10/2013  . ASCUS pap with negative HRHPV 08/10/2013  . Pes planus (flat feet) 09/02/2012  . Anemia 09/02/2012  . Smoker 09/02/2012    Past Surgical History:  Procedure Laterality Date   . AMPUTATION Right 09/04/2016   Procedure: Right Above Knee Amputation;  Surgeon: Newt Minion, MD;  Location: [facility];  Service: Orthopedics;  Laterality: Right;  . CESAREAN SECTION    . CHOLECYSTECTOMY    . HYSTEROSCOPY N/A 08/23/2013   Procedure: HYSTEROSCOPY D&C WITH HYDROTHERMAL ABLATION;  Surgeon: Osborne Oman, MD;  Location: [facility];  Service: Gynecology;  Laterality: N/A;  . I&D EXTREMITY Right 09/02/2016   Procedure: IRRIGATION AND DEBRIDEMENT EXTREMITY;  Surgeon: Newt Minion, MD;  Location: [facility];  Service: Orthopedics;  Laterality: Right;  . TUBAL LIGATION       OB History    Gravida  4   Para  4   Term  4   Preterm      AB      Living  2     SAB      TAB      Ectopic      Multiple      Live Births  4            Home Medications    Prior to Admission medications   Medication Sig Start Date End Date Taking? Authorizing Provider  carvedilol (COREG) 25 MG tablet Take 1 tablet (25 mg total) by mouth 2 (two) times daily with a meal. 09/22/17  Yes Fransico Meadow, [place]  cloNIDine (CATAPRES) 0.3 MG tablet Take 1 tablet (0.3 mg total) by mouth 3 (three) times daily. 09/22/17 10/29/25 Yes Caryl Ada K, PA-C  hydrALAZINE (APRESOLINE) 100 MG tablet Take 1 tablet (100 mg total) by mouth every 8 (eight) hours. 09/22/17  Yes Caryl Ada K, PA-C  ibuprofen (ADVIL,MOTRIN) 200 MG tablet Take 200 mg by mouth every 6 (six) hours as needed for mild pain.    Yes [provider]  metoprolol succinate (TOPROL-XL) 100 MG 24 hr tablet Take 1 tablet (100 mg total) by mouth daily. Take with or immediately following a meal. 09/22/17  Yes Fransico Meadow, PA-C  ranitidine (ZANTAC) 150 MG tablet Take 150 mg by mouth 2 (two) times daily before a meal.    Yes [provider]  traMADol (ULTRAM) 50 MG tablet Take 1 tablet (50 mg total) by mouth every 8 (eight) hours as needed. 09/29/17  Yes Azzie Glatter, FNP    Family History Family History  Problem Relation  Age of Onset  . CAD Father   . Heart failure Brother   . Hypertension Brother   . CAD Paternal Aunt     Social History Social History   Tobacco Use  . Smoking status: Current Every Day Smoker    Packs/day: 0.50    Years: 14.00    Pack years: 7.00    Types: Cigarettes  . Smokeless tobacco: Never Used  Substance Use Topics  . Alcohol use: No  . Drug use: Yes    Types: Marijuana     Allergies   Tape   Review of Systems Review of Systems  Constitutional: Negative for diaphoresis and fever.  Respiratory: Negative for shortness of breath.   Cardiovascular: Negative for chest pain.  Gastrointestinal: Negative for abdominal pain, nausea and vomiting.  Genitourinary: Positive for flank pain. Negative for dysuria, frequency and hematuria.  Neurological: Negative for headaches.  All other systems reviewed and are negative.    Physical Exam Updated Vital Signs BP (!) 218/132 (BP Location: Right Wrist)   Pulse 69   Temp 98.8 F (37.1 C) (Oral)   Resp (!) 24   Ht 5\' 7"  (1.702 m)   Wt (!) 152 kg   LMP 08/06/2013   SpO2 95%   BMI 52.47 kg/m   Physical Exam  Constitutional: She is oriented to person, place, and time. She appears well-developed and well-nourished. No distress.  HENT:  Head: Normocephalic and atraumatic.  Mouth/Throat: Oropharynx is clear and moist. No oropharyngeal exudate.  Eyes: Pupils are equal, round, and reactive to light. Conjunctivae are normal.  Neck: Normal range of motion. Neck supple.  Cardiovascular: Normal rate, regular rhythm, normal heart sounds and intact distal pulses.  Pulmonary/Chest: Effort normal and breath sounds normal. No stridor. She has no wheezes. She has no rales.  Abdominal: Soft. Bowel sounds are normal. She exhibits no mass. There is no tenderness. There is no rebound and no guarding.  Musculoskeletal: Normal range of motion.  Neurological: She is alert and oriented to person, place, and time. She displays normal reflexes.   Skin: Skin is warm and dry. Capillary refill takes less than 2 seconds.  Psychiatric: She has a normal mood and affect.     ED Treatments / Results  Labs (all labs ordered are listed, but only abnormal results are displayed) Results for orders placed or performed during the hospital encounter of 10/29/17  Urinalysis, Routine w reflex microscopic- may I&O cath if menses  Result Value Ref Range   Color,  Urine YELLOW YELLOW   APPearance HAZY (A) CLEAR   Specific Gravity, Urine 1.025 1.005 - 1.030   pH 6.0 5.0 - 8.0   Glucose, UA NEGATIVE NEGATIVE mg/dL   Hgb urine dipstick NEGATIVE NEGATIVE   Bilirubin Urine NEGATIVE NEGATIVE   Ketones, ur NEGATIVE NEGATIVE mg/dL   Protein, ur 30 (A) NEGATIVE mg/dL   Nitrite NEGATIVE NEGATIVE   Leukocytes, UA SMALL (A) NEGATIVE   RBC / HPF 0-5 0 - 5 RBC/hpf   WBC, UA 0-5 0 - 5 WBC/hpf   Bacteria, UA RARE (A) NONE SEEN   Squamous Epithelial / LPF 6-10 0 - 5   Mucus PRESENT   Basic metabolic panel  Result Value Ref Range   Sodium 139 135 - 145 mmol/L   Potassium 3.8 3.5 - 5.1 mmol/L   Chloride 104 98 - 111 mmol/L   CO2 26 22 - 32 mmol/L   Glucose, Bld 105 (H) 70 - 99 mg/dL   BUN 14 6 - 20 mg/dL   Creatinine, Ser 0.64 0.44 - 1.00 mg/dL   Calcium 8.8 (L) 8.9 - 10.3 mg/dL   GFR calc non Af Amer >60 >60 mL/min   GFR calc Af Amer >60 >60 mL/min   Anion gap 9 5 - 15  CBC  Result Value Ref Range   WBC 9.0 4.0 - 10.5 K/uL   RBC 5.55 (H) 3.87 - 5.11 MIL/uL   Hemoglobin 10.4 (L) 12.0 - 15.0 g/dL   HCT 35.6 (L) 36.0 - 46.0 %   MCV 64.1 (L) 78.0 - 100.0 fL   MCH 18.7 (L) 26.0 - 34.0 pg   MCHC 29.2 (L) 30.0 - 36.0 g/dL   RDW 17.0 (H) 11.5 - 15.5 %   Platelets 314 150 - 400 K/uL   Ct Renal Stone Study  Result Date: 10/29/2017 CLINICAL DATA:  Initial evaluation for acute right flank pain with urinary frequency for 1 week. EXAM: CT ABDOMEN AND PELVIS WITHOUT CONTRAST TECHNIQUE: Multidetector CT imaging of the abdomen and pelvis was performed  following the standard protocol without IV contrast. COMPARISON:  Prior CT from 11/04/2015 FINDINGS: Lower chest: Visualized lung bases are clear. Hepatobiliary: Mild diffuse hypoattenuation liver suggestive of steatosis. Limited noncontrast evaluation of the liver otherwise unremarkable gallbladder surgically absent. No biliary dilatation. Pancreas: Pancreas within normal limits. Spleen: Spleen within normal limits. Adrenals/Urinary Tract: Adrenal glands are normal. Kidneys equal in size. Right kidney mildly externally rotated. No nephrolithiasis or hydronephrosis. No radiopaque calculi seen along the course of either renal collecting system. No hydroureter. Partially distended bladder within normal limits. No layering stones within the bladder lumen. Stomach/Bowel: Stomach within normal limits. No evidence for bowel obstruction. Normal appendix. Colonic diverticulosis without evidence for acute diverticulitis. No acute inflammatory changes seen about the bowels. Vascular/Lymphatic: Mild atheromatous plaque about the aortic bifurcation. No aneurysm. No adenopathy. Reproductive: Uterus within normal limits. Right ovary unremarkable. 4.1 cm left ovarian cyst (series 3, image 68). Adjacent subcentimeter calcification similar to previous. Other: Fat containing paraumbilical hernia with associated small amount of trapped fluid. No other free air or fluid. Musculoskeletal: No acute osseus abnormality. No worrisome lytic or blastic osseous lesions. Facet arthropathy noted within the lower lumbar spine. IMPRESSION: 1. No CT evidence for nephrolithiasis or obstructive uropathy. 2. No other acute intra-abdominal or pelvic process. 3. Colonic diverticulosis without evidence for acute diverticulitis. 4. Fat containing paraumbilical hernia with small amount of trapped fluid. 5. 4.1 cm left ovarian cyst. Finding is almost certainly benign and represents a physiologic  follicular cyst. 6. Hepatic steatosis. Electronically Signed    By: Jeannine Boga M.D.   On: 10/29/2017 03:18    EKG None  Radiology Ct Renal Stone Study  Result Date: 10/29/2017 CLINICAL DATA:  Initial evaluation for acute right flank pain with urinary frequency for 1 week. EXAM: CT ABDOMEN AND PELVIS WITHOUT CONTRAST TECHNIQUE: Multidetector CT imaging of the abdomen and pelvis was performed following the standard protocol without IV contrast. COMPARISON:  Prior CT from 11/04/2015 FINDINGS: Lower chest: Visualized lung bases are clear. Hepatobiliary: Mild diffuse hypoattenuation liver suggestive of steatosis. Limited noncontrast evaluation of the liver otherwise unremarkable gallbladder surgically absent. No biliary dilatation. Pancreas: Pancreas within normal limits. Spleen: Spleen within normal limits. Adrenals/Urinary Tract: Adrenal glands are normal. Kidneys equal in size. Right kidney mildly externally rotated. No nephrolithiasis or hydronephrosis. No radiopaque calculi seen along the course of either renal collecting system. No hydroureter. Partially distended bladder within normal limits. No layering stones within the bladder lumen. Stomach/Bowel: Stomach within normal limits. No evidence for bowel obstruction. Normal appendix. Colonic diverticulosis without evidence for acute diverticulitis. No acute inflammatory changes seen about the bowels. Vascular/Lymphatic: Mild atheromatous plaque about the aortic bifurcation. No aneurysm. No adenopathy. Reproductive: Uterus within normal limits. Right ovary unremarkable. 4.1 cm left ovarian cyst (series 3, image 68). Adjacent subcentimeter calcification similar to previous. Other: Fat containing paraumbilical hernia with associated small amount of trapped fluid. No other free air or fluid. Musculoskeletal: No acute osseus abnormality. No worrisome lytic or blastic osseous lesions. Facet arthropathy noted within the lower lumbar spine. IMPRESSION: 1. No CT evidence for nephrolithiasis or obstructive uropathy. 2.  No other acute intra-abdominal or pelvic process. 3. Colonic diverticulosis without evidence for acute diverticulitis. 4. Fat containing paraumbilical hernia with small amount of trapped fluid. 5. 4.1 cm left ovarian cyst. Finding is almost certainly benign and represents a physiologic follicular cyst. 6. Hepatic steatosis. Electronically Signed   By: Jeannine Boga M.D.   On: 10/29/2017 03:18    Procedures Procedures (including critical care time)  Medications Ordered in ED Medications  amLODipine (NORVASC) tablet 10 mg (has no administration in time range)  lidocaine (LIDODERM) 5 % 2 patch (has no administration in time range)  ketorolac (TORADOL) 30 MG/ML injection 30 mg (has no administration in time range)       Final Clinical Impressions(s) / ED Diagnoses   Symptoms consistent with sciatica, will treat for same.     Return for fevers >100.4 unrelieved by medication, shortness of breath, intractable vomiting, or diarrhea, Inability to tolerate liquids or food, cough, altered mental status or any concerns. No signs of systemic illness or infection. The patient is nontoxic-appearing on exam and vital signs are within normal limits.   I have reviewed the triage vital signs and the nursing notes. Pertinent labs &imaging results that were available during my care of the patient were reviewed by me and considered in my medical decision making (see chart for details).  After history, exam, and medical workup I feel the patient has been appropriately medically screened and is safe for discharge home. Pertinent diagnoses were discussed with the patient. Patient was given return precautions.     Calise Dunckel, MD 10/29/17 [phone]

## 2017-10-29 NOTE — ED Triage Notes (Signed)
Pt reports R flank pain with urinary frequency X1 week. Pt poor historian. In NAD.

## 2017-10-30 LAB — URINE CULTURE

## 2017-11-04 ENCOUNTER — Ambulatory Visit (HOSPITAL_COMMUNITY)
Admission: EM | Admit: 2017-11-04 | Discharge: 2017-11-04 | Disposition: A | Discharge status: Home / Self Care | Payer: Medicare Other | Attending: Family Medicine | Admitting: Family Medicine

## 2017-11-04 ENCOUNTER — Encounter (HOSPITAL_COMMUNITY): Payer: Self-pay

## 2017-11-04 DIAGNOSIS — Z760 Encounter for issue of repeat prescription: Secondary | ICD-10-CM

## 2017-11-04 DIAGNOSIS — K0889 Other specified disorders of teeth and supporting structures: Secondary | ICD-10-CM

## 2017-11-04 DIAGNOSIS — I10 Essential (primary) hypertension: Secondary | ICD-10-CM

## 2017-11-04 MED ORDER — KETOROLAC TROMETHAMINE 30 MG/ML IJ SOLN
30.0000 mg | Freq: Once | INTRAMUSCULAR | Status: AC
  Administered 2017-11-04: 30 mg via INTRAMUSCULAR

## 2017-11-04 MED ORDER — IBUPROFEN 600 MG PO TABS: 600.0000 mg | ORAL_TABLET | Freq: Four times a day (QID) | ORAL | 0 refills | Status: DC | PRN

## 2017-11-04 MED ORDER — AMOXICILLIN-POT CLAVULANATE 875-125 MG PO TABS: 1.0000 | ORAL_TABLET | Freq: Two times a day (BID) | ORAL | 0 refills | Status: AC

## 2017-11-04 MED ORDER — KETOROLAC TROMETHAMINE 30 MG/ML IJ SOLN
INTRAMUSCULAR | Status: AC
  Filled 2017-11-04: qty 1

## 2017-11-04 MED ORDER — CLONIDINE HCL 0.3 MG PO TABS: 0.3000 mg | ORAL_TABLET | Freq: Three times a day (TID) | ORAL | 0 refills | Status: DC

## 2017-11-04 NOTE — Discharge Instructions (Addendum)
Please use dental resource to contact offices to seek permenant treatment/relief.   Today we have given you an antibiotic. This should help with pain as any infection is cleared.   We gave you a shot of toradol today for pain   For pain please take 600mg -800mg  of Ibuprofen every 8 hours, take with 1000 mg of Tylenol Extra strength every 8 hours. These are safe to take together. Please take with food.   I have refilled your clonidine, please continue to monitor your blood pressure at home, please work on getting set up with a primary care that can provide you with further refills of your medicine  Please follow-up if developing fever, facial swelling, difficulty swallowing, neck stiffness, difficulty moving neck, elevated blood pressure with headache, vision changes, weakness or difficulty speaking  Please return if you start to experience significant swelling of your face, experiencing fever.

## 2017-11-04 NOTE — ED Provider Notes (Signed)
[facility]    CSN: [id number] Arrival date & time: 11/04/17  1609     History   Chief Complaint Chief Complaint  Patient presents with  . Dental Pain    HPI Andres E Micallef is a 47 y.o. female history of hypertension, GERD, systolic heart failure presenting today for evaluation of dental patient and medication refill of her hypertension medications.  Patient states that for the past 4 to 5 days she has had worsening pain to the tooth on her left upper jaw.  States that she recently corrected while eating tacos.  Since she has had worsening pain that radiates to her left ear.  Denies swelling.  Denies fever.  Does not have dentistry set up.  Patient also requesting refill of her clonidine, has been out for a couple of days.  Patient did follow-up with family physician, but states that she would like to find a new PCP.  HPI  Past Medical History:  Diagnosis Date  . Anemia   . Anxiety   . Arthritis   . Bipolar 1 disorder ([place])   . Blood dyscrasia    sickle cell trait  . Chronic back pain   . Chronic systolic (congestive) heart failure ([place])   . GERD (gastroesophageal reflux disease)   . Hypertension   . Obesity   . Polysubstance abuse ([place])   . Shortness of breath    on exertion due to weight  . Stroke [facility])     Patient Active Problem List   Diagnosis Date Noted  . AKI (acute kidney injury) ([place])   . Nausea   . Cocaine abuse ([place])   . Marijuana abuse   . Morbid obesity ([place])   . Hypertensive urgency 10/14/2016  . 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 Bipolar disorder ([place]) 08/31/2016  . History of stroke 08/07/2016  . Polysubstance abuse ([place])   . Chronic systolic (congestive) heart failure ([place])   . Depression 11/03/2015  . GERD (gastroesophageal reflux disease) 11/03/2015  . Pain in the abdomen 11/03/2015  . Chronic back pain   . Uterine fibroids 08/10/2013  . ASCUS pap with negative HRHPV 08/10/2013  . Pes planus  (flat feet) 09/02/2012  . Anemia 09/02/2012  . Smoker 09/02/2012    Past Surgical History:  Procedure Laterality Date  . AMPUTATION Right 09/04/2016   Procedure: Right Above Knee Amputation;  Surgeon: Newt Minion, MD;  Location: [facility];  Service: Orthopedics;  Laterality: Right;  . CESAREAN SECTION    . CHOLECYSTECTOMY    . HYSTEROSCOPY N/A 08/23/2013   Procedure: HYSTEROSCOPY D&C WITH HYDROTHERMAL ABLATION;  Surgeon: Osborne Oman, MD;  Location: [facility];  Service: Gynecology;  Laterality: N/A;  . I&D EXTREMITY Right 09/02/2016   Procedure: IRRIGATION AND DEBRIDEMENT EXTREMITY;  Surgeon: Newt Minion, MD;  Location: [facility];  Service: Orthopedics;  Laterality: Right;  . TUBAL LIGATION      OB History    Gravida  4   Para  4   Term  4   Preterm      AB      Living  2     SAB      TAB      Ectopic      Multiple      Live Births  4            Home Medications    Prior to Admission medications   Medication Sig Start Date End Date  Taking? Authorizing Provider  amoxicillin-clavulanate (AUGMENTIN) 875-125 MG tablet Take 1 tablet by mouth every 12 (twelve) hours for 7 days. 11/04/17 11/11/17  Wieters, Hallie C, PA-C  carvedilol (COREG) 25 MG tablet Take 1 tablet (25 mg total) by mouth 2 (two) times daily with a meal. 09/22/17   Fransico Meadow, PA-C  cloNIDine (CATAPRES) 0.3 MG tablet Take 1 tablet (0.3 mg total) by mouth 3 (three) times daily. 11/04/17 12/04/17  Wieters, Hallie C, PA-C  hydrALAZINE (APRESOLINE) 100 MG tablet Take 1 tablet (100 mg total) by mouth every 8 (eight) hours. 09/22/17   Fransico Meadow, PA-C  ibuprofen (ADVIL,MOTRIN) 600 MG tablet Take 1 tablet (600 mg total) by mouth every 6 (six) hours as needed. 11/04/17   Wieters, Hallie C, PA-C  lidocaine (LIDODERM) 5 % Place 1 patch onto the skin daily. Remove & Discard patch within 12 hours or as directed by MD 10/29/17   Randal Buba, April, MD  metoprolol succinate (TOPROL-XL) 100 MG 24 hr tablet Take 1 tablet  (100 mg total) by mouth daily. Take with or immediately following a meal. 09/22/17   Fransico Meadow, PA-C  ranitidine (ZANTAC) 150 MG tablet Take 150 mg by mouth 2 (two) times daily before a meal.     [provider]  traMADol (ULTRAM) 50 MG tablet Take 1 tablet (50 mg total) by mouth every 8 (eight) hours as needed. 09/29/17   Azzie Glatter, FNP    Family History Family History  Problem Relation Age of Onset  . CAD Father   . Heart failure Brother   . Hypertension Brother   . CAD Paternal Aunt     Social History Social History   Tobacco Use  . Smoking status: Current Every Day Smoker    Packs/day: 0.50    Years: 14.00    Pack years: 7.00    Types: Cigarettes  . Smokeless tobacco: Never Used  Substance Use Topics  . Alcohol use: No  . Drug use: Yes    Types: Marijuana     Allergies   Tape   Review of Systems Review of Systems  Constitutional: Negative for fatigue and fever.  HENT: Positive for dental problem and ear pain. Negative for congestion, sinus pressure and sore throat.   Eyes: Negative for photophobia, pain and visual disturbance.  Respiratory: Negative for cough and shortness of breath.   Cardiovascular: Negative for chest pain.  Gastrointestinal: Negative for abdominal pain, nausea and vomiting.  Genitourinary: Negative for decreased urine volume and hematuria.  Musculoskeletal: Negative for myalgias, neck pain and neck stiffness.  Neurological: Negative for dizziness, syncope, facial asymmetry, speech difficulty, weakness, light-headedness, numbness and headaches.     Physical Exam Triage Vital Signs ED Triage Vitals [11/04/17 1701]  Enc Vitals Group     BP 140/87     Pulse Rate 76     Resp 20     Temp 98.9 F (37.2 C)     Temp Source Oral     SpO2 100 %     Weight      Height      Head Circumference      Peak Flow      Pain Score      Pain Loc      Pain Edu?      Excl. in [place]?    No data found.  Updated Vital Signs BP  140/87 (BP Location: Left Arm)   Pulse 76   Temp 98.9 F (37.2 C) (Oral)  Resp 20   LMP 08/06/2013   SpO2 100%   Visual Acuity Right Eye Distance:   Left Eye Distance:   Bilateral Distance:    Right Eye Near:   Left Eye Near:    Bilateral Near:     Physical Exam  Constitutional: She appears well-developed and well-nourished. No distress.  HENT:  Head: Normocephalic and atraumatic.  Cracked teeth to left upper and lower jaw, mild gingival tenderness surrounding tooth, no tenderness to palpation of soft palate below the tongue  Eyes: Conjunctivae are normal.  Neck: Neck supple.  Full active range of motion of neck, no swelling or erythema  Cardiovascular: Normal rate and regular rhythm.  No murmur heard. Pulmonary/Chest: Effort normal and breath sounds normal. No respiratory distress.  Abdominal: Soft. There is no tenderness.  Musculoskeletal: She exhibits no edema.  Neurological: She is alert.  Skin: Skin is warm and dry.  Psychiatric: She has a normal mood and affect.  Nursing note and vitals reviewed.    UC Treatments / Results  Labs (all labs ordered are listed, but only abnormal results are displayed) Labs Reviewed - No data to display  EKG None  Radiology No results found.  Procedures Procedures (including critical care time)  Medications Ordered in UC Medications  ketorolac (TORADOL) 30 MG/ML injection 30 mg (30 mg Intramuscular Given 11/04/17 1735)    Initial Impression / Assessment and Plan / UC Course  I have reviewed the triage vital signs and the nursing notes.  Pertinent labs & imaging results that were available during my care of the patient were reviewed by me and considered in my medical decision making (see chart for details).     Patient with dental pain, will provide Augmentin to take twice daily, will provide injection of Toradol today, Tylenol and ibuprofen for further pain management temporarily, follow-up with dentistry for more  permanent treatment.  Dental resource list provided.  Patient refilled clonidine, will have patient find PCP with PCP assistance when she has been here multiple times for refills and is on multiple medicines needs to have closer follow-up.  Discussed strict return precautions. Patient verbalized understanding and is agreeable with plan.  Final Clinical Impressions(s) / UC Diagnoses   Final diagnoses:  Pain, dental     Discharge Instructions     Please use dental resource to contact offices to seek permenant treatment/relief.   Today we have given you an antibiotic. This should help with pain as any infection is cleared.   We gave you a shot of toradol today for pain   For pain please take 600mg -800mg  of Ibuprofen every 8 hours, take with 1000 mg of Tylenol Extra strength every 8 hours. These are safe to take together. Please take with food.   I have refilled your clonidine, please continue to monitor your blood pressure at home, please work on getting set up with a primary care that can provide you with further refills of your medicine  Please follow-up if developing fever, facial swelling, difficulty swallowing, neck stiffness, difficulty moving neck, elevated blood pressure with headache, vision changes, weakness or difficulty speaking  Please return if you start to experience significant swelling of your face, experiencing fever.   ED Prescriptions    Medication Sig Dispense Auth. Provider   amoxicillin-clavulanate (AUGMENTIN) 875-125 MG tablet Take 1 tablet by mouth every 12 (twelve) hours for 7 days. 14 tablet Wieters, Hallie C, PA-C   ibuprofen (ADVIL,MOTRIN) 600 MG tablet Take 1 tablet (600 mg total) by mouth  every 6 (six) hours as needed. 30 tablet Wieters, Hallie C, PA-C   cloNIDine (CATAPRES) 0.3 MG tablet Take 1 tablet (0.3 mg total) by mouth 3 (three) times daily. 90 tablet Wieters, Mitchell C, PA-C     Controlled Substance Prescriptions [place] Controlled Substance  Registry consulted? Not Applicable   Janith Lima, [place] 11/04/17 1806

## 2017-11-04 NOTE — ED Triage Notes (Signed)
Pt presents with ongoing dental pain on left side top and bottom.

## 2017-12-07 ENCOUNTER — Emergency Department (HOSPITAL_COMMUNITY): Payer: Medicare Other

## 2017-12-07 ENCOUNTER — Emergency Department (HOSPITAL_COMMUNITY)
Admission: EM | Admit: 2017-12-07 | Discharge: 2017-12-08 | Disposition: A | Discharge status: Home / Self Care | Payer: Medicare Other | Attending: Emergency Medicine | Admitting: Emergency Medicine

## 2017-12-07 ENCOUNTER — Other Ambulatory Visit: Payer: Self-pay

## 2017-12-07 ENCOUNTER — Encounter (HOSPITAL_COMMUNITY): Payer: Self-pay | Admitting: Emergency Medicine

## 2017-12-07 DIAGNOSIS — I110 Hypertensive heart disease with heart failure: Secondary | ICD-10-CM | POA: Diagnosis not present

## 2017-12-07 DIAGNOSIS — Z79899 Other long term (current) drug therapy: Secondary | ICD-10-CM | POA: Diagnosis not present

## 2017-12-07 DIAGNOSIS — M25562 Pain in left knee: Secondary | ICD-10-CM | POA: Diagnosis present

## 2017-12-07 DIAGNOSIS — F329 Major depressive disorder, single episode, unspecified: Secondary | ICD-10-CM | POA: Diagnosis not present

## 2017-12-07 DIAGNOSIS — F32A Depression, unspecified: Secondary | ICD-10-CM

## 2017-12-07 DIAGNOSIS — F17210 Nicotine dependence, cigarettes, uncomplicated: Secondary | ICD-10-CM | POA: Insufficient documentation

## 2017-12-07 DIAGNOSIS — E876 Hypokalemia: Secondary | ICD-10-CM

## 2017-12-07 DIAGNOSIS — I5022 Chronic systolic (congestive) heart failure: Secondary | ICD-10-CM | POA: Insufficient documentation

## 2017-12-07 LAB — COMPREHENSIVE METABOLIC PANEL
ALBUMIN: 3.6 g/dL (ref 3.5–5.0)
ALK PHOS: 80 U/L (ref 38–126)
ALT: 17 U/L (ref 0–44)
AST: 20 U/L (ref 15–41)
Anion gap: 8 (ref 5–15)
BUN: 13 mg/dL (ref 6–20)
CALCIUM: 8.6 mg/dL — AB (ref 8.9–10.3)
CO2: 23 mmol/L (ref 22–32)
CREATININE: 0.97 mg/dL (ref 0.44–1.00)
Chloride: 108 mmol/L (ref 98–111)
GFR calc Af Amer: >60 mL/min (ref >60)
GLUCOSE: 102 mg/dL — AB (ref 70–99)
POTASSIUM: 2.8 mmol/L — AB (ref 3.5–5.1)
Sodium: 139 mmol/L (ref 135–145)
TOTAL PROTEIN: 6.8 g/dL (ref 6.5–8.1)
Total Bilirubin: 0.6 mg/dL (ref 0.3–1.2)

## 2017-12-07 LAB — ACETAMINOPHEN LEVEL: Acetaminophen (Tylenol), Serum: <10 ug/mL — ABNORMAL LOW (ref 10–30)

## 2017-12-07 LAB — CBC
HEMATOCRIT: 41.8 % (ref 36.0–46.0)
HEMOGLOBIN: 12 g/dL (ref 12.0–15.0)
MCH: 17.5 pg — ABNORMAL LOW (ref 26.0–34.0)
MCHC: 28.7 g/dL — AB (ref 30.0–36.0)
MCV: 60.9 fL — AB (ref 80.0–100.0)
Platelets: 412 10*3/uL — ABNORMAL HIGH (ref 150–400)
RBC: 6.86 MIL/uL — ABNORMAL HIGH (ref 3.87–5.11)
RDW: 15.9 % — ABNORMAL HIGH (ref 11.5–15.5)
WBC: 10.5 10*3/uL (ref 4.0–10.5)
nRBC: 0 % (ref 0.0–0.2)

## 2017-12-07 LAB — I-STAT BETA HCG BLOOD, ED (MC, WL, AP ONLY): I-stat hCG, quantitative: <5 m[IU]/mL (ref <5)

## 2017-12-07 LAB — SALICYLATE LEVEL: Salicylate Lvl: <7 mg/dL (ref 2.8–30.0)

## 2017-12-07 LAB — ETHANOL

## 2017-12-07 MED ORDER — METOPROLOL SUCCINATE ER 100 MG PO TB24
100.0000 mg | ORAL_TABLET | Freq: Every day | ORAL | Status: DC
  Filled 2017-12-07: qty 1

## 2017-12-07 MED ORDER — POTASSIUM CHLORIDE CRYS ER 20 MEQ PO TBCR
40.0000 meq | EXTENDED_RELEASE_TABLET | Freq: Once | ORAL | Status: AC
  Administered 2017-12-08: 40 meq via ORAL
  Filled 2017-12-07: qty 2

## 2017-12-07 MED ORDER — CLONIDINE HCL 0.2 MG PO TABS
0.3000 mg | ORAL_TABLET | Freq: Three times a day (TID) | ORAL | Status: DC
  Administered 2017-12-08: 0.3 mg via ORAL
  Filled 2017-12-07: qty 2

## 2017-12-07 MED ORDER — FAMOTIDINE 20 MG PO TABS
20.0000 mg | ORAL_TABLET | Freq: Every day | ORAL | Status: DC
  Administered 2017-12-08: 20 mg via ORAL
  Filled 2017-12-07: qty 1

## 2017-12-07 MED ORDER — IBUPROFEN 400 MG PO TABS
600.0000 mg | ORAL_TABLET | Freq: Four times a day (QID) | ORAL | Status: DC | PRN
  Administered 2017-12-08: 600 mg via ORAL
  Filled 2017-12-07: qty 1

## 2017-12-07 MED ORDER — LORAZEPAM 1 MG PO TABS
1.0000 mg | ORAL_TABLET | Freq: Once | ORAL | Status: AC
  Administered 2017-12-08: 1 mg via ORAL
  Filled 2017-12-07: qty 1

## 2017-12-07 MED ORDER — TRAMADOL HCL 50 MG PO TABS
50.0000 mg | ORAL_TABLET | Freq: Three times a day (TID) | ORAL | Status: DC | PRN
  Administered 2017-12-08: 50 mg via ORAL
  Filled 2017-12-07: qty 1

## 2017-12-07 MED ORDER — HYDRALAZINE HCL 25 MG PO TABS
100.0000 mg | ORAL_TABLET | Freq: Three times a day (TID) | ORAL | Status: DC
  Administered 2017-12-08 (×2): 100 mg via ORAL
  Filled 2017-12-07 (×2): qty 4

## 2017-12-07 MED ORDER — CARVEDILOL 12.5 MG PO TABS
25.0000 mg | ORAL_TABLET | Freq: Two times a day (BID) | ORAL | Status: DC
  Administered 2017-12-08: 25 mg via ORAL
  Filled 2017-12-07: qty 2

## 2017-12-07 NOTE — ED Notes (Signed)
Personal belongings inventoried and stored at locker #2 [facility] , pt.'s bracelets/rings/neclace stored at security safe.
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
## 2017-12-08 ENCOUNTER — Encounter (HOSPITAL_COMMUNITY): Payer: Self-pay | Admitting: Behavioral Health

## 2017-12-08 DIAGNOSIS — M25562 Pain in left knee: Secondary | ICD-10-CM | POA: Diagnosis not present

## 2017-12-08 MED ORDER — LORAZEPAM 1 MG PO TABS
1.0000 mg | ORAL_TABLET | Freq: Once | ORAL | Status: AC
  Administered 2017-12-08: 1 mg via ORAL
  Filled 2017-12-08: qty 1

## 2017-12-08 MED ORDER — CLONIDINE HCL 0.3 MG PO TABS: 0.3000 mg | ORAL_TABLET | Freq: Three times a day (TID) | ORAL | 0 refills | Status: DC

## 2017-12-08 MED ORDER — POTASSIUM CHLORIDE CRYS ER 20 MEQ PO TBCR
40.0000 meq | EXTENDED_RELEASE_TABLET | Freq: Every day | ORAL | Status: DC
  Administered 2017-12-08: 40 meq via ORAL
  Filled 2017-12-08: qty 2

## 2017-12-08 NOTE — ED Notes (Signed)
Patient has her NIV Bible at her bedside.

## 2017-12-08 NOTE — ED Provider Notes (Signed)
I received a call from behavioral health that they have evaluated Kelly Novak and have cleared from psychiatric standpoint.  They are giving her some resources for follow-up.   Hayden Rasmussen, MD 12/08/17 1016

## 2017-12-08 NOTE — ED Notes (Signed)
Breakfast tray delivered, pt declines shower at this time

## 2017-12-08 NOTE — Progress Notes (Signed)
CSW received call from [facility] that pt has been cleared by psych and that resources are being sent over. CSW expressed understanding and to provide pt with transportation home as needed.   At this time there are no further CSW needs. CSW will sign off.    Virgie Dad. Fiana Gladu, MSW, [facility] Clinical Social Worker [phone]

## 2017-12-08 NOTE — ED Notes (Signed)
Chaplain at bedside, pt declines the Bible the chaplain brought, pt demanding her  Bible and journal, pt informed that she could have one book at a time and that this RN will swap books with her whenever possible, pt agrees to plan, NT to go through pts belongings to locate her personal Bible

## 2017-12-08 NOTE — ED Notes (Signed)
TTS video interview completed. 

## 2017-12-08 NOTE — ED Notes (Signed)
TTS being completed at this time by NP, pt tearful

## 2017-12-08 NOTE — ED Notes (Signed)
Patient was given a large gown to put on, Pt. Wasn't able to wear the [place], we didn't have her size.

## 2017-12-08 NOTE — ED Notes (Signed)
Pts belongings returned from security and locker, pt charging phone to call transport, declines using cab voucher d/t wheelchair per pt

## 2017-12-08 NOTE — ED Notes (Signed)
Regular Diet was ordered for Lunch. 
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
## 2017-12-08 NOTE — Discharge Instructions (Signed)
You were evaluated by behavioral health and felt to be safe for discharge.  pharmacy reviewed your medication list and recommended that you stop your carvedilol and review this with your primary care doctor.

## 2017-12-08 NOTE — ED Notes (Signed)
TTS Clinician unable to assess patient. Patient continues to refuse TTS assessment stating, "I am not mental health, I am not mental health". "I don't care what your name is if you are not my leg or heart doctor I am not talking to you, I am not mental health, turn off my light". Patient continued to yell at TTS worker. Clinician notified nurse. TTS will attempt to reassess in the morning.

## 2017-12-08 NOTE — ED Notes (Signed)
Pt refusing TTS, will reassess in the am.

## 2017-12-08 NOTE — ED Notes (Signed)
Pt loudly requesting something for her nerves, pt sitting naked in her room, pt provided a large gown and agrees to put it on, pt tearful, pt states, "I don't want to hear this behavioral health stuff. I am hurt and grieving. I am not crazy." pt just completed TTS, MD notified re: pt request for Ativan and anxiety, pt to receive med. Chaplain called per pt request for a bible
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
## 2017-12-08 NOTE — Progress Notes (Signed)
CSW acknowledges consult. CSW aware that pt is needing to be assessed by TTS at this time. CSW will continue to follow for further needs at this time.    Virgie Dad Chalee Hirota, MSW, [place] Clinical Social Worker [phone]
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
## 2018-12-31 ENCOUNTER — Other Ambulatory Visit: Payer: Self-pay

## 2018-12-31 ENCOUNTER — Emergency Department (HOSPITAL_COMMUNITY)
Admission: EM | Admit: 2018-12-31 | Discharge: 2018-12-31 | Disposition: A | Discharge status: Home / Self Care | Payer: Medicare Other | Attending: Emergency Medicine | Admitting: Emergency Medicine

## 2018-12-31 ENCOUNTER — Emergency Department (HOSPITAL_COMMUNITY): Payer: Medicare Other

## 2018-12-31 ENCOUNTER — Encounter (HOSPITAL_COMMUNITY): Payer: Self-pay

## 2018-12-31 DIAGNOSIS — Z8673 Personal history of transient ischemic attack (TIA), and cerebral infarction without residual deficits: Secondary | ICD-10-CM | POA: Diagnosis not present

## 2018-12-31 DIAGNOSIS — R070 Pain in throat: Secondary | ICD-10-CM | POA: Diagnosis not present

## 2018-12-31 DIAGNOSIS — Z79899 Other long term (current) drug therapy: Secondary | ICD-10-CM | POA: Diagnosis not present

## 2018-12-31 DIAGNOSIS — I110 Hypertensive heart disease with heart failure: Secondary | ICD-10-CM | POA: Diagnosis not present

## 2018-12-31 DIAGNOSIS — R1012 Left upper quadrant pain: Secondary | ICD-10-CM | POA: Insufficient documentation

## 2018-12-31 DIAGNOSIS — I5022 Chronic systolic (congestive) heart failure: Secondary | ICD-10-CM | POA: Insufficient documentation

## 2018-12-31 DIAGNOSIS — F17210 Nicotine dependence, cigarettes, uncomplicated: Secondary | ICD-10-CM | POA: Insufficient documentation

## 2018-12-31 DIAGNOSIS — R0602 Shortness of breath: Secondary | ICD-10-CM | POA: Insufficient documentation

## 2018-12-31 DIAGNOSIS — R05 Cough: Secondary | ICD-10-CM | POA: Insufficient documentation

## 2018-12-31 DIAGNOSIS — Z20822 Contact with and (suspected) exposure to covid-19: Secondary | ICD-10-CM

## 2018-12-31 DIAGNOSIS — R2242 Localized swelling, mass and lump, left lower limb: Secondary | ICD-10-CM | POA: Insufficient documentation

## 2018-12-31 DIAGNOSIS — Z20828 Contact with and (suspected) exposure to other viral communicable diseases: Secondary | ICD-10-CM | POA: Insufficient documentation

## 2018-12-31 DIAGNOSIS — M7989 Other specified soft tissue disorders: Secondary | ICD-10-CM

## 2018-12-31 LAB — I-STAT BETA HCG BLOOD, ED (MC, WL, AP ONLY): I-stat hCG, quantitative: <5 m[IU]/mL (ref <5)

## 2018-12-31 LAB — CBC WITH DIFFERENTIAL/PLATELET
Abs Immature Granulocytes: 0.07 10*3/uL (ref 0.00–0.07)
Basophils Absolute: 0.1 10*3/uL (ref 0.0–0.1)
Basophils Relative: 1 %
Eosinophils Absolute: 0.6 10*3/uL — ABNORMAL HIGH (ref 0.0–0.5)
Eosinophils Relative: 7 %
HCT: 34.1 % — ABNORMAL LOW (ref 36.0–46.0)
Hemoglobin: 10 g/dL — ABNORMAL LOW (ref 12.0–15.0)
Immature Granulocytes: 1 %
Lymphocytes Relative: 29 %
Lymphs Abs: 2.4 10*3/uL (ref 0.7–4.0)
MCH: 18.8 pg — ABNORMAL LOW (ref 26.0–34.0)
MCHC: 29.3 g/dL — ABNORMAL LOW (ref 30.0–36.0)
MCV: 64.1 fL — ABNORMAL LOW (ref 80.0–100.0)
Monocytes Absolute: 0.6 10*3/uL (ref 0.1–1.0)
Monocytes Relative: 7 %
Neutro Abs: 4.7 10*3/uL (ref 1.7–7.7)
Neutrophils Relative %: 55 %
Platelets: 347 10*3/uL (ref 150–400)
RBC: 5.32 MIL/uL — ABNORMAL HIGH (ref 3.87–5.11)
RDW: 18.5 % — ABNORMAL HIGH (ref 11.5–15.5)
WBC: 8.4 10*3/uL (ref 4.0–10.5)
nRBC: 0 % (ref 0.0–0.2)

## 2018-12-31 LAB — COMPREHENSIVE METABOLIC PANEL
ALT: 14 U/L (ref 0–44)
AST: 15 U/L (ref 15–41)
Albumin: 3.6 g/dL (ref 3.5–5.0)
Alkaline Phosphatase: 92 U/L (ref 38–126)
Anion gap: 10 (ref 5–15)
BUN: 16 mg/dL (ref 6–20)
CO2: 24 mmol/L (ref 22–32)
Calcium: 8.4 mg/dL — ABNORMAL LOW (ref 8.9–10.3)
Chloride: 103 mmol/L (ref 98–111)
Creatinine, Ser: 0.56 mg/dL (ref 0.44–1.00)
GFR calc Af Amer: >60 mL/min (ref >60)
GFR calc non Af Amer: >60 mL/min (ref >60)
Glucose, Bld: 105 mg/dL — ABNORMAL HIGH (ref 70–99)
Potassium: 4.4 mmol/L (ref 3.5–5.1)
Sodium: 137 mmol/L (ref 135–145)
Total Bilirubin: 0.5 mg/dL (ref 0.3–1.2)
Total Protein: 7.1 g/dL (ref 6.5–8.1)

## 2018-12-31 LAB — URINALYSIS, ROUTINE W REFLEX MICROSCOPIC
Bilirubin Urine: NEGATIVE
Glucose, UA: NEGATIVE mg/dL
Hgb urine dipstick: NEGATIVE
Ketones, ur: NEGATIVE mg/dL
Leukocytes,Ua: NEGATIVE
Nitrite: NEGATIVE
Protein, ur: NEGATIVE mg/dL
Specific Gravity, Urine: 1.039 — ABNORMAL HIGH (ref 1.005–1.030)
pH: 6 (ref 5.0–8.0)

## 2018-12-31 LAB — LACTIC ACID, PLASMA: Lactic Acid, Venous: 1.5 mmol/L (ref 0.5–1.9)

## 2018-12-31 LAB — BRAIN NATRIURETIC PEPTIDE: B Natriuretic Peptide: 43.6 pg/mL (ref 0.0–100.0)

## 2018-12-31 LAB — TROPONIN I (HIGH SENSITIVITY)
Troponin I (High Sensitivity): 4 ng/L (ref <18)
Troponin I (High Sensitivity): 5 ng/L (ref <18)

## 2018-12-31 LAB — LIPASE, BLOOD: Lipase: 20 U/L (ref 11–51)

## 2018-12-31 LAB — SARS CORONAVIRUS 2 (TAT 6-24 HRS): SARS Coronavirus 2: NEGATIVE

## 2018-12-31 MED ORDER — ONDANSETRON HCL 4 MG/2ML IJ SOLN
4.0000 mg | Freq: Once | INTRAMUSCULAR | Status: AC
  Administered 2018-12-31: 4 mg via INTRAVENOUS
  Filled 2018-12-31: qty 2

## 2018-12-31 MED ORDER — SODIUM CHLORIDE (PF) 0.9 % IJ SOLN
INTRAMUSCULAR | Status: AC
  Filled 2018-12-31: qty 50

## 2018-12-31 MED ORDER — METHOCARBAMOL 500 MG PO TABS: 500.0000 mg | ORAL_TABLET | Freq: Two times a day (BID) | ORAL | 0 refills | Status: DC

## 2018-12-31 MED ORDER — IOHEXOL 350 MG/ML SOLN
100.0000 mL | Freq: Once | INTRAVENOUS | Status: AC | PRN
  Administered 2018-12-31: 100 mL via INTRAVENOUS

## 2018-12-31 MED ORDER — LIDOCAINE VISCOUS HCL 2 % MT SOLN
15.0000 mL | Freq: Once | OROMUCOSAL | Status: AC
  Administered 2018-12-31: 15 mL via OROMUCOSAL
  Filled 2018-12-31: qty 15

## 2018-12-31 MED ORDER — DEXAMETHASONE SODIUM PHOSPHATE 10 MG/ML IJ SOLN
10.0000 mg | Freq: Once | INTRAMUSCULAR | Status: AC
  Administered 2018-12-31: 10 mg via INTRAVENOUS
  Filled 2018-12-31: qty 1

## 2018-12-31 MED ORDER — MORPHINE SULFATE (PF) 4 MG/ML IV SOLN
4.0000 mg | Freq: Once | INTRAVENOUS | Status: AC
  Administered 2018-12-31: 4 mg via INTRAVENOUS
  Filled 2018-12-31: qty 1

## 2018-12-31 NOTE — Discharge Instructions (Signed)
Please quarantine at home until you get the results of your COVID test Please continue green tea, honey, and popsicles for you sore throat Take Robaxin as needed for muscle pain/side pain Elevate the leg and get compression stockings to help with leg swelling Return if you are worsening

## 2018-12-31 NOTE — ED Provider Notes (Signed)
[facility] DEPT Provider Note   CSN: [id number] Arrival date & time: 12/31/18  0109     History   Chief Complaint Chief Complaint  Patient presents with  . Flank Pain  . Leg Swelling  . Shortness of Breath    HPI Kelly Novak is a 48 y.o. female with a hx of asthma, CHF, GERD, HTN, CVA presents to the Emergency Department complaining of gradual, persistent, progressively worsening left side upper abd, lower rib and flank pain onset 3-4 days ago. Pt reports no new activities or straining.  Moving and coughing makes the pain worse.  Pt denies falls or known injury. Pt denies urinary and vaginal symptoms.   She also reports increased swelling of her of left leg for the same amount of time.  Pt reports several of her home aids tested positive for COVID, but pt has not been tested.  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 Denies hx of DVT/PE.   Pt also c/o throat pain and stridor onset approx 1 week ago.  Pt reports associated voice change and difficulty swallowing.  She states she feels as if "something is blocking" her throat.  Talking makes her pain significantly worse.  Pt reports increased SOB requiring more usage of her inhaler. Pt reports taking Advil PM and flexeril without significant relief.  Pt reports smoking 1 ppd.  Pt reports the smoking makes her shortness of breath.  Pt reports using marijuana and cocaine this weekend.        The history is provided by the patient and medical records. No language interpreter was used.    Past Medical History:  Diagnosis Date  . Anemia   . Anxiety   . Arthritis   . Bipolar 1 disorder ([place])   . Blood dyscrasia    sickle cell trait  . Chronic back pain   . Chronic systolic (congestive) heart failure ([place])   . GERD (gastroesophageal reflux disease)   . Hypertension   . Obesity   . Polysubstance abuse ([place])   . Shortness of breath    on exertion due to weight  . Stroke [facility])     Patient Active Problem  List   Diagnosis Date Noted  . AKI (acute kidney injury) ([place])   . Nausea   . Cocaine abuse ([place])   . Marijuana abuse   . Morbid obesity ([place])   . Hypertensive urgency 10/14/2016  . 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 Bipolar disorder ([place]) 08/31/2016  . History of stroke 08/07/2016  . Polysubstance abuse ([place])   . Chronic systolic (congestive) heart failure ([place])   . Depression 11/03/2015  . GERD (gastroesophageal reflux disease) 11/03/2015  . Pain in the abdomen 11/03/2015  . Chronic back pain   . Uterine fibroids 08/10/2013  . ASCUS pap with negative HRHPV 08/10/2013  . Pes planus (flat feet) 09/02/2012  . Anemia 09/02/2012  . Smoker 09/02/2012    Past Surgical History:  Procedure Laterality Date  . AMPUTATION Right 09/04/2016   Procedure: Right Above Knee Amputation;  Surgeon: Newt Minion, MD;  Location: [facility];  Service: Orthopedics;  Laterality: Right;  . CESAREAN SECTION    . CHOLECYSTECTOMY    . HYSTEROSCOPY N/A 08/23/2013   Procedure: HYSTEROSCOPY D&C WITH HYDROTHERMAL ABLATION;  Surgeon: Osborne Oman, MD;  Location: [facility];  Service: Gynecology;  Laterality: N/A;  . I&D EXTREMITY Right 09/02/2016   Procedure: IRRIGATION AND DEBRIDEMENT EXTREMITY;  Surgeon:  Newt Minion, MD;  Location: [facility];  Service: Orthopedics;  Laterality: Right;  . TUBAL LIGATION       OB History    Gravida  4   Para  4   Term  4   Preterm      AB      Living  2     SAB      TAB      Ectopic      Multiple      Live Births  4            Home Medications    Prior to Admission medications   Medication Sig Start Date End Date Taking? Authorizing Provider  albuterol (VENTOLIN HFA) 108 (90 Base) MCG/ACT inhaler Inhale 2 puffs into the lungs every 4 (four) hours as needed for wheezing.  11/09/18  Yes [provider]  amLODipine (NORVASC) 10 MG tablet Take 10 mg by mouth daily. 11/19/18  Yes [provider]   carvedilol (COREG) 25 MG tablet Take 25 mg by mouth 2 (two) times daily. 12/19/18  Yes [provider]  cloNIDine (CATAPRES) 0.3 MG tablet Take 1 tablet (0.3 mg total) by mouth 3 (three) times daily. 12/08/17 12/31/18 Yes Hayden Rasmussen, MD  cyclobenzaprine (FLEXERIL) 10 MG tablet Take 10 mg by mouth 3 (three) times daily as needed for muscle spasms.  12/17/18  Yes [provider]  gabapentin (NEURONTIN) 300 MG capsule Take 300 mg by mouth 3 (three) times daily. 12/06/18  Yes [provider]  hydrALAZINE (APRESOLINE) 100 MG tablet Take 1 tablet (100 mg total) by mouth every 8 (eight) hours. Patient taking differently: Take 100 mg by mouth 2 (two) times daily.  09/22/17  Yes Caryl Ada K, PA-C  Ibuprofen-diphenhydrAMINE HCl (ADVIL PM) 200-25 MG CAPS Take 2 tablets by mouth at bedtime as needed (sleep/pain).   Yes [provider]  PARoxetine (PAXIL) 20 MG tablet Take 20 mg by mouth daily. 12/06/18  Yes [provider]  Vitamin D, Ergocalciferol, (DRISDOL) 1.25 MG (50000 UT) CAPS capsule Take 50,000 Units by mouth every Sunday.   Yes [provider]  VRAYLAR capsule Take 1.5 mg by mouth daily. 11/21/18  Yes [provider]  ibuprofen (ADVIL,MOTRIN) 600 MG tablet Take 1 tablet (600 mg total) by mouth every 6 (six) hours as needed. Patient not taking: Reported on 12/31/2018 11/04/17   Wieters, Hallie C, PA-C  lidocaine (LIDODERM) 5 % Place 1 patch onto the skin daily. Remove & Discard patch within 12 hours or as directed by MD Patient not taking: Reported on 12/31/2018 10/29/17   Palumbo, April, MD  metoprolol succinate (TOPROL-XL) 100 MG 24 hr tablet Take 1 tablet (100 mg total) by mouth daily. Take with or immediately following a meal. Patient not taking: Reported on 12/31/2018 09/22/17   Fransico Meadow, PA-C  traMADol (ULTRAM) 50 MG tablet Take 1 tablet (50 mg total) by mouth every 8 (eight) hours as needed. Patient not taking: Reported on  12/31/2018 09/29/17   Azzie Glatter, FNP    Family History Family History  Problem Relation Age of Onset  . CAD Father   . Heart failure Brother   . Hypertension Brother   . CAD Paternal Aunt     Social History Social History   Tobacco Use  . Smoking status: Current Every Day Smoker    Packs/day: 0.50    Years: 14.00    Pack years: 7.00    Types: Cigarettes  .  Smokeless tobacco: Never Used  Substance Use Topics  . Alcohol use: Not Currently  . Drug use: Yes    Types: Marijuana, Cocaine     Allergies   Tape   Review of Systems Review of Systems  Constitutional: Positive for fatigue. Negative for appetite change, diaphoresis, fever and unexpected weight change.  HENT: Positive for voice change. Negative for mouth sores.   Eyes: Negative for visual disturbance.  Respiratory: Positive for shortness of breath. Negative for cough, chest tightness and wheezing.   Cardiovascular: Negative for chest pain.  Gastrointestinal: Positive for abdominal pain (LUQ). Negative for constipation, diarrhea, nausea and vomiting.  Endocrine: Negative for polydipsia, polyphagia and polyuria.  Genitourinary: Positive for flank pain ( Left). Negative for dysuria, frequency, hematuria and urgency.  Musculoskeletal: Negative for back pain and neck stiffness.  Skin: Negative for rash.  Allergic/Immunologic: Negative for immunocompromised state.  Neurological: Negative for syncope, light-headedness and headaches.  Hematological: Does not bruise/bleed easily.  Psychiatric/Behavioral: Negative for sleep disturbance. The patient is not nervous/anxious.      Physical Exam Updated Vital Signs BP (!) 144/85   Pulse 76   Temp 97.9 F (36.6 C) (Oral)   Resp (!) 23   LMP 08/06/2013   SpO2 97%   Physical Exam Vitals signs and nursing note reviewed.  Constitutional:      General: She is not in acute distress.    Appearance: She is not diaphoretic.     Comments: Uncomfortable appearing   HENT:     Head: Normocephalic.     Right Ear: External ear normal.     Left Ear: External ear normal.     Nose: Nose normal.     Mouth/Throat:     Lips: Pink.     Mouth: Mucous membranes are moist.     Pharynx: Oropharynx is clear. Uvula midline. No pharyngeal swelling, oropharyngeal exudate, posterior oropharyngeal erythema or uvula swelling.     Tonsils: No tonsillar exudate or tonsillar abscesses. 1+ on the right. 1+ on the left.     Comments: Hoarse voice Eyes:     General: No scleral icterus.    Conjunctiva/sclera: Conjunctivae normal.  Neck:     Musculoskeletal: Normal range of motion. No neck rigidity, spinous process tenderness or muscular tenderness.     Comments: Handling secretions without difficulty Cardiovascular:     Rate and Rhythm: Normal rate and regular rhythm.     Pulses: Normal pulses.          Radial pulses are 2+ on the right side and 2+ on the left side.  Pulmonary:     Effort: No tachypnea, accessory muscle usage, prolonged expiration, respiratory distress or retractions.     Breath sounds: Transmitted upper airway sounds present. No stridor.     Comments: Equal chest rise. No increased work of breathing. Chest:    Abdominal:     General: There is no distension.     Palpations: Abdomen is soft.     Tenderness: There is abdominal tenderness in the left upper quadrant. There is guarding. There is no right CVA tenderness, left CVA tenderness or rebound.  Musculoskeletal:     Left lower leg: Edema present.     Comments: Moves all extremities equally and without difficulty. Right AKA. Left lower extremity with 2+ pitting edema  Skin:    General: Skin is warm and dry.     Capillary Refill: Capillary refill takes less than 2 seconds.  Neurological:     Mental Status: She  is alert.     GCS: GCS eye subscore is 4. GCS verbal subscore is 5. GCS motor subscore is 6.     Comments: Speech is clear and goal oriented.  Psychiatric:        Mood and Affect: Mood  normal.      ED Treatments / Results  Labs (all labs ordered are listed, but only abnormal results are displayed) Labs Reviewed  CBC WITH DIFFERENTIAL/PLATELET - Abnormal; Notable for the following components:      Result Value   RBC 5.32 (*)    Hemoglobin 10.0 (*)    HCT 34.1 (*)    MCV 64.1 (*)    MCH 18.8 (*)    MCHC 29.3 (*)    RDW 18.5 (*)    Eosinophils Absolute 0.6 (*)    All other components within normal limits  COMPREHENSIVE METABOLIC PANEL - Abnormal; Notable for the following components:   Glucose, Bld 105 (*)    Calcium 8.4 (*)    All other components within normal limits  SARS CORONAVIRUS 2 (TAT 6-24 HRS)  LIPASE, BLOOD  BRAIN NATRIURETIC PEPTIDE  URINALYSIS, ROUTINE W REFLEX MICROSCOPIC  I-STAT BETA HCG BLOOD, ED ([facility], [facility], [facility] ONLY)  TROPONIN I (HIGH SENSITIVITY)  TROPONIN I (HIGH SENSITIVITY)    EKG EKG Interpretation  Date/Time:  Saturday December 31 2018 01:30:57 EST Ventricular Rate:  77 PR Interval:    QRS Duration: 94 QT Interval:  407 QTC Calculation: 461 R Axis:   47 Text Interpretation: Sinus rhythm Inferior ischemia has resolved Confirmed by Pryor Curia [phone]) on 12/31/2018 3:21:14 AM   Radiology Dg Neck Soft Tissue  Result Date: 12/31/2018 CLINICAL DATA:  Initial evaluation for stridor. EXAM: NECK SOFT TISSUES - 1+ VIEW COMPARISON:  None. FINDINGS: Examination technically limited by body habitus. Supraglottic airway is grossly patent. No obvious epiglottic enlargement. Visualized upper retropharyngeal soft tissues within normal limits. No visible radiopaque foreign body. Multilevel facet hypertrophy noted within the cervical spine. Partially visualized lung apices are grossly clear. There is no evidence of retropharyngeal soft tissue swelling or epiglottic enlargement. The cervical airway is unremarkable and no radio-opaque foreign body identified. IMPRESSION: Technically limited exam due to habitus.  No obvious abnormality. Electronically  Signed   By: Jeannine Boga M.D.   On: 12/31/2018 04:10   Dg Chest Port 1 View  Result Date: 12/31/2018 CLINICAL DATA:  Initial evaluation for acute shortness of breath, history of CHF. EXAM: PORTABLE CHEST 1 VIEW COMPARISON:  Prior radiograph from 02/05/2017. FINDINGS: Cardiomegaly, stable from previous. Mediastinal silhouette within normal limits. Lungs mildly hypoinflated. Perihilar vascular congestion with diffuse interstitial prominence, compatible with mild diffuse pulmonary interstitial edema. No visible pleural effusion. No focal infiltrates. No pneumothorax. No acute osseous finding. IMPRESSION: Cardiomegaly with perihilar vascular congestion and diffuse interstitial prominence, compatible with mild diffuse pulmonary interstitial edema. Electronically Signed   By: Jeannine Boga M.D.   On: 12/31/2018 04:08    Procedures Procedures (including critical care time)  Medications Ordered in ED Medications  morphine 4 MG/ML injection 4 mg (4 mg Intravenous Given 12/31/18 0427)  ondansetron (ZOFRAN) injection 4 mg (4 mg Intravenous Given 12/31/18 0425)     Initial Impression / Assessment and Plan / ED Course  I have reviewed the triage vital signs and the nursing notes.  Pertinent labs & imaging results that were available during my care of the patient were reviewed by me and considered in my medical decision making (see chart for details).  Clinical Course  as of Dec 30 717  Sat Dec 31, 2018  [id number] Anemia, down from 12.1 approximately 1 year ago however prior to this patient normally 10-11.  Hemoglobin(!): 10.0 [HM]  0453 Within normal limits  Creatinine: 0.56 [HM]  0453 Initial troponin negative.  Troponin I (High Sensitivity): 4 [HM]  [id number] Within normal limits and lower than previous values  B Natriuretic Peptide: 43.6 [HM]  0715 Sept 2017 Echo: Severe concentric hypertrophy. Systolic function was mildly reduced. The estimated ejection fraction was in the range of 45% to  50%. Mild diffuse hypokinesis with no identifiable regional variations.   [HM]    Clinical Course User Index [HM] Avika Carbine, [place], Deichmann was evaluated in Emergency Department on 12/31/2018 for the symptoms described in the history of present illness. She was evaluated in the context of the global COVID-19 pandemic, which necessitated consideration that the patient might be at risk for infection with the SARS-CoV-2 virus that causes COVID-19. Institutional protocols and algorithms that pertain to the evaluation of patients at risk for COVID-19 are in a state of rapid change based on information released by regulatory bodies including the CDC and federal and state organizations. These policies and algorithms were followed during the patient's care in the ED.  Patient with numerous symptoms but most concerned about her left upper quadrant abdominal pain.  Patient tender in the left upper quadrant and left lower ribs on exam.  She does not tachycardia but does have pleuritic pain.  Concern for possible pulmonary embolism.  CT angiogram chest and CT abdomen/pelvis ordered.  Additionally patient hypertensive and mildly tachypneic.  No hypoxia.  Her left leg is swollen.  No history of DVT however she is not mobile.  Labs show anemia not far from previous baseline.  Otherwise labs are reassuring.  EKG is nonischemic.  Troponin negative.  BNP within normal limits and below previous.  Chest x-ray with questionable fluid overload and history of CHF with echo in 2017 with EF 45-50%.    Persistent concern for Covid exists.  At this time patient does not require supplemental oxygen.  7:18 AM At shift change care was transferred to Janetta Hora, PA-C who will follow pending studies (including CT scans), re-evaulate and determine disposition.  Anticipate possible admission.   The patient was discussed with and seen by Dr. Leonides Schanz who agrees with the treatment plan.      Final Clinical  Impressions(s) / ED Diagnoses   Final diagnoses:  Suspected COVID-19 virus infection  Shortness of breath  Leg swelling  Left upper quadrant abdominal pain    ED Discharge Orders    None       Loni Muse Gwenlyn Perking 12/31/18 0719    Ward, Delice Bison, DO 12/31/18 [phone]

## 2018-12-31 NOTE — ED Notes (Signed)
Pt remind of need of urine sample pt previously given female urinal by this Probation officer. Pt stated she uses them at home and does not need assistance

## 2018-12-31 NOTE — ED Triage Notes (Cosign Needed)
BIB  EMS from home. Pt complains of left flank pain x3 days worse with movement and shob with hx of CHF. Pt also reporting throat pain when eating.Pt reports left leg swelling witch in new. PT above the knee amputee on right side.  100% RA 151/93 87 97.5

## 2018-12-31 NOTE — ED Provider Notes (Signed)
48 year old female with hx of right AKA, obesity, HTN, CHF presents with L upper flank/LUQ pain for the past couple days along with a sore throat, voice hoarseness, SOB, and L leg swelling. Labs are reassuring. At shift change CTA of chest to r/o PE is ordered along with CT abdomen/pelvis. COVID swab obtained.   CTA chest is negative for PE, pneumonia, fluid overload. CT abdomen/pelvis shows inflammatory changes around her umbilical hernia concerning for incarcerated hernia. She states her pain is not in this area and is more over the L lateral rib cage. She does have tenderness with deep palpation but exam is difficult due to body habitus. Reduction was attempted but it's unclear if successful since she's not having a lot of pain in this area.   Discussed with Dr. Harlow Asa with surgery. He feels that this would not be contributing to her pain and they would not do anything about this kind of hernia anyways since it only contains fat and not bowel.   Rechecked pt. She has tolerated PO. Her throat feels better with lidocaine. Her side pain is likely MSK. She was given meds for pain for a couple days and advised to f/u with her doctor   Recardo Evangelist, PA-C 12/31/18 1518    Blanchie Dessert, MD 12/31/18 1545

## 2019-07-11 ENCOUNTER — Other Ambulatory Visit: Payer: Self-pay

## 2019-07-11 ENCOUNTER — Inpatient Hospital Stay (HOSPITAL_COMMUNITY)
Admission: EM | Admit: 2019-07-11 | Discharge: 2019-07-14 | DRG: 603 | Disposition: A | Discharge status: Home Health | Payer: Medicare Other | Attending: Internal Medicine | Admitting: Internal Medicine

## 2019-07-11 DIAGNOSIS — D573 Sickle-cell trait: Secondary | ICD-10-CM | POA: Diagnosis present

## 2019-07-11 DIAGNOSIS — G629 Polyneuropathy, unspecified: Secondary | ICD-10-CM | POA: Diagnosis present

## 2019-07-11 DIAGNOSIS — I160 Hypertensive urgency: Secondary | ICD-10-CM | POA: Diagnosis present

## 2019-07-11 DIAGNOSIS — Z79899 Other long term (current) drug therapy: Secondary | ICD-10-CM

## 2019-07-11 DIAGNOSIS — Z8673 Personal history of transient ischemic attack (TIA), and cerebral infarction without residual deficits: Secondary | ICD-10-CM

## 2019-07-11 DIAGNOSIS — L0390 Cellulitis, unspecified: Secondary | ICD-10-CM

## 2019-07-11 DIAGNOSIS — S81802A Unspecified open wound, left lower leg, initial encounter: Secondary | ICD-10-CM

## 2019-07-11 DIAGNOSIS — F1410 Cocaine abuse, uncomplicated: Secondary | ICD-10-CM | POA: Diagnosis present

## 2019-07-11 DIAGNOSIS — Z20822 Contact with and (suspected) exposure to covid-19: Secondary | ICD-10-CM | POA: Diagnosis present

## 2019-07-11 DIAGNOSIS — L089 Local infection of the skin and subcutaneous tissue, unspecified: Secondary | ICD-10-CM

## 2019-07-11 DIAGNOSIS — I110 Hypertensive heart disease with heart failure: Secondary | ICD-10-CM | POA: Diagnosis present

## 2019-07-11 DIAGNOSIS — K219 Gastro-esophageal reflux disease without esophagitis: Secondary | ICD-10-CM | POA: Diagnosis present

## 2019-07-11 DIAGNOSIS — Z833 Family history of diabetes mellitus: Secondary | ICD-10-CM

## 2019-07-11 DIAGNOSIS — Z791 Long term (current) use of non-steroidal anti-inflammatories (NSAID): Secondary | ICD-10-CM

## 2019-07-11 DIAGNOSIS — L97829 Non-pressure chronic ulcer of other part of left lower leg with unspecified severity: Secondary | ICD-10-CM | POA: Diagnosis present

## 2019-07-11 DIAGNOSIS — I872 Venous insufficiency (chronic) (peripheral): Secondary | ICD-10-CM | POA: Diagnosis present

## 2019-07-11 DIAGNOSIS — Z8249 Family history of ischemic heart disease and other diseases of the circulatory system: Secondary | ICD-10-CM

## 2019-07-11 DIAGNOSIS — F17210 Nicotine dependence, cigarettes, uncomplicated: Secondary | ICD-10-CM | POA: Diagnosis present

## 2019-07-11 DIAGNOSIS — Z6841 Body Mass Index (BMI) 40.0 and over, adult: Secondary | ICD-10-CM

## 2019-07-11 DIAGNOSIS — F319 Bipolar disorder, unspecified: Secondary | ICD-10-CM | POA: Diagnosis present

## 2019-07-11 DIAGNOSIS — I5022 Chronic systolic (congestive) heart failure: Secondary | ICD-10-CM | POA: Diagnosis present

## 2019-07-11 DIAGNOSIS — Z89611 Acquired absence of right leg above knee: Secondary | ICD-10-CM

## 2019-07-11 DIAGNOSIS — I10 Essential (primary) hypertension: Secondary | ICD-10-CM

## 2019-07-11 DIAGNOSIS — L03116 Cellulitis of left lower limb: Principal | ICD-10-CM | POA: Diagnosis present

## 2019-07-11 DIAGNOSIS — F1910 Other psychoactive substance abuse, uncomplicated: Secondary | ICD-10-CM | POA: Diagnosis present

## 2019-07-11 DIAGNOSIS — F1210 Cannabis abuse, uncomplicated: Secondary | ICD-10-CM | POA: Diagnosis present

## 2019-07-12 ENCOUNTER — Emergency Department (HOSPITAL_COMMUNITY): Payer: Medicare Other

## 2019-07-12 ENCOUNTER — Encounter (HOSPITAL_COMMUNITY): Payer: Self-pay

## 2019-07-12 ENCOUNTER — Other Ambulatory Visit: Payer: Self-pay

## 2019-07-12 DIAGNOSIS — I5022 Chronic systolic (congestive) heart failure: Secondary | ICD-10-CM | POA: Diagnosis present

## 2019-07-12 DIAGNOSIS — L03115 Cellulitis of right lower limb: Secondary | ICD-10-CM

## 2019-07-12 DIAGNOSIS — K219 Gastro-esophageal reflux disease without esophagitis: Secondary | ICD-10-CM | POA: Diagnosis present

## 2019-07-12 DIAGNOSIS — Z833 Family history of diabetes mellitus: Secondary | ICD-10-CM | POA: Diagnosis not present

## 2019-07-12 DIAGNOSIS — L03116 Cellulitis of left lower limb: Secondary | ICD-10-CM | POA: Diagnosis present

## 2019-07-12 DIAGNOSIS — F1910 Other psychoactive substance abuse, uncomplicated: Secondary | ICD-10-CM | POA: Diagnosis not present

## 2019-07-12 DIAGNOSIS — D573 Sickle-cell trait: Secondary | ICD-10-CM | POA: Diagnosis present

## 2019-07-12 DIAGNOSIS — I110 Hypertensive heart disease with heart failure: Secondary | ICD-10-CM | POA: Diagnosis present

## 2019-07-12 DIAGNOSIS — F1210 Cannabis abuse, uncomplicated: Secondary | ICD-10-CM | POA: Diagnosis present

## 2019-07-12 DIAGNOSIS — Z791 Long term (current) use of non-steroidal anti-inflammatories (NSAID): Secondary | ICD-10-CM | POA: Diagnosis not present

## 2019-07-12 DIAGNOSIS — Z79899 Other long term (current) drug therapy: Secondary | ICD-10-CM | POA: Diagnosis not present

## 2019-07-12 DIAGNOSIS — I872 Venous insufficiency (chronic) (peripheral): Secondary | ICD-10-CM | POA: Diagnosis present

## 2019-07-12 DIAGNOSIS — S81802A Unspecified open wound, left lower leg, initial encounter: Secondary | ICD-10-CM | POA: Diagnosis not present

## 2019-07-12 DIAGNOSIS — G629 Polyneuropathy, unspecified: Secondary | ICD-10-CM | POA: Diagnosis present

## 2019-07-12 DIAGNOSIS — F1410 Cocaine abuse, uncomplicated: Secondary | ICD-10-CM | POA: Diagnosis present

## 2019-07-12 DIAGNOSIS — L0390 Cellulitis, unspecified: Secondary | ICD-10-CM | POA: Diagnosis present

## 2019-07-12 DIAGNOSIS — Z8249 Family history of ischemic heart disease and other diseases of the circulatory system: Secondary | ICD-10-CM | POA: Diagnosis not present

## 2019-07-12 DIAGNOSIS — F319 Bipolar disorder, unspecified: Secondary | ICD-10-CM | POA: Diagnosis present

## 2019-07-12 DIAGNOSIS — L97829 Non-pressure chronic ulcer of other part of left lower leg with unspecified severity: Secondary | ICD-10-CM | POA: Diagnosis present

## 2019-07-12 DIAGNOSIS — F17210 Nicotine dependence, cigarettes, uncomplicated: Secondary | ICD-10-CM | POA: Diagnosis present

## 2019-07-12 DIAGNOSIS — Z89611 Acquired absence of right leg above knee: Secondary | ICD-10-CM | POA: Diagnosis not present

## 2019-07-12 DIAGNOSIS — I160 Hypertensive urgency: Secondary | ICD-10-CM | POA: Diagnosis present

## 2019-07-12 DIAGNOSIS — I10 Essential (primary) hypertension: Secondary | ICD-10-CM | POA: Diagnosis not present

## 2019-07-12 DIAGNOSIS — Z8673 Personal history of transient ischemic attack (TIA), and cerebral infarction without residual deficits: Secondary | ICD-10-CM | POA: Diagnosis not present

## 2019-07-12 DIAGNOSIS — Z6841 Body Mass Index (BMI) 40.0 and over, adult: Secondary | ICD-10-CM | POA: Diagnosis not present

## 2019-07-12 DIAGNOSIS — Z20822 Contact with and (suspected) exposure to covid-19: Secondary | ICD-10-CM | POA: Diagnosis present

## 2019-07-12 LAB — CBC WITH DIFFERENTIAL/PLATELET
Abs Immature Granulocytes: 0.05 10*3/uL (ref 0.00–0.07)
Basophils Absolute: 0.1 10*3/uL (ref 0.0–0.1)
Basophils Relative: 1 %
Eosinophils Absolute: 0.4 10*3/uL (ref 0.0–0.5)
Eosinophils Relative: 4 %
HCT: 39.6 % (ref 36.0–46.0)
Hemoglobin: 11.5 g/dL — ABNORMAL LOW (ref 12.0–15.0)
Immature Granulocytes: 1 %
Lymphocytes Relative: 26 %
Lymphs Abs: 2.5 10*3/uL (ref 0.7–4.0)
MCH: 18.6 pg — ABNORMAL LOW (ref 26.0–34.0)
MCHC: 29 g/dL — ABNORMAL LOW (ref 30.0–36.0)
MCV: 64 fL — ABNORMAL LOW (ref 80.0–100.0)
Monocytes Absolute: 0.6 10*3/uL (ref 0.1–1.0)
Monocytes Relative: 6 %
Neutro Abs: 6.1 10*3/uL (ref 1.7–7.7)
Neutrophils Relative %: 62 %
Platelets: 323 10*3/uL (ref 150–400)
RBC: 6.19 MIL/uL — ABNORMAL HIGH (ref 3.87–5.11)
RDW: 19.3 % — ABNORMAL HIGH (ref 11.5–15.5)
WBC: 9.7 10*3/uL (ref 4.0–10.5)
nRBC: 0.2 % (ref 0.0–0.2)

## 2019-07-12 LAB — RAPID URINE DRUG SCREEN, HOSP PERFORMED
Amphetamines: POSITIVE — AB
Barbiturates: NOT DETECTED
Benzodiazepines: NOT DETECTED
Cocaine: POSITIVE — AB
Opiates: POSITIVE — AB
Tetrahydrocannabinol: POSITIVE — AB

## 2019-07-12 LAB — C-REACTIVE PROTEIN: CRP: 1.2 mg/dL — ABNORMAL HIGH (ref <1.0)

## 2019-07-12 LAB — BASIC METABOLIC PANEL
Anion gap: 10 (ref 5–15)
BUN: 15 mg/dL (ref 6–20)
CO2: 26 mmol/L (ref 22–32)
Calcium: 8.4 mg/dL — ABNORMAL LOW (ref 8.9–10.3)
Chloride: 102 mmol/L (ref 98–111)
Creatinine, Ser: 0.59 mg/dL (ref 0.44–1.00)
GFR calc Af Amer: >60 mL/min (ref >60)
GFR calc non Af Amer: >60 mL/min (ref >60)
Glucose, Bld: 92 mg/dL (ref 70–99)
Potassium: 3.9 mmol/L (ref 3.5–5.1)
Sodium: 138 mmol/L (ref 135–145)

## 2019-07-12 LAB — SEDIMENTATION RATE: Sed Rate: 5 mm/hr (ref 0–22)

## 2019-07-12 LAB — BRAIN NATRIURETIC PEPTIDE: B Natriuretic Peptide: 24.5 pg/mL (ref 0.0–100.0)

## 2019-07-12 LAB — SARS CORONAVIRUS 2 BY RT PCR (HOSPITAL ORDER, PERFORMED IN ~~LOC~~ HOSPITAL LAB): SARS Coronavirus 2: NEGATIVE

## 2019-07-12 LAB — HIV ANTIBODY (ROUTINE TESTING W REFLEX): HIV Screen 4th Generation wRfx: NONREACTIVE

## 2019-07-12 LAB — LACTIC ACID, PLASMA: Lactic Acid, Venous: 1.7 mmol/L (ref 0.5–1.9)

## 2019-07-12 MED ORDER — ACETAMINOPHEN 650 MG RE SUPP: 650.0000 mg | Freq: Four times a day (QID) | RECTAL | Status: DC | PRN

## 2019-07-12 MED ORDER — CLINDAMYCIN PHOSPHATE 600 MG/50ML IV SOLN
600.0000 mg | Freq: Once | INTRAVENOUS | Status: AC
  Administered 2019-07-12: 600 mg via INTRAVENOUS
  Filled 2019-07-12: qty 50

## 2019-07-12 MED ORDER — LABETALOL HCL 5 MG/ML IV SOLN
10.0000 mg | INTRAVENOUS | Status: DC | PRN
  Filled 2019-07-12: qty 4

## 2019-07-12 MED ORDER — AMLODIPINE BESYLATE 10 MG PO TABS
10.0000 mg | ORAL_TABLET | Freq: Every day | ORAL | Status: DC
  Administered 2019-07-12 – 2019-07-14 (×3): 10 mg via ORAL
  Filled 2019-07-12: qty 1
  Filled 2019-07-12: qty 2
  Filled 2019-07-12: qty 1

## 2019-07-12 MED ORDER — GABAPENTIN 300 MG PO CAPS
300.0000 mg | ORAL_CAPSULE | Freq: Three times a day (TID) | ORAL | Status: DC
  Administered 2019-07-12 – 2019-07-14 (×7): 300 mg via ORAL
  Filled 2019-07-12 (×7): qty 1

## 2019-07-12 MED ORDER — ALBUTEROL SULFATE (2.5 MG/3ML) 0.083% IN NEBU: 2.5000 mg | INHALATION_SOLUTION | Freq: Four times a day (QID) | RESPIRATORY_TRACT | Status: DC | PRN

## 2019-07-12 MED ORDER — CLINDAMYCIN PHOSPHATE 600 MG/50ML IV SOLN
600.0000 mg | Freq: Three times a day (TID) | INTRAVENOUS | Status: DC
  Administered 2019-07-12 – 2019-07-13 (×5): 600 mg via INTRAVENOUS
  Filled 2019-07-12 (×7): qty 50

## 2019-07-12 MED ORDER — HYDROCHLOROTHIAZIDE 25 MG PO TABS
25.0000 mg | ORAL_TABLET | Freq: Every day | ORAL | Status: DC
  Administered 2019-07-12 – 2019-07-14 (×3): 25 mg via ORAL
  Filled 2019-07-12 (×4): qty 1

## 2019-07-12 MED ORDER — ACETAMINOPHEN 325 MG PO TABS
650.0000 mg | ORAL_TABLET | Freq: Four times a day (QID) | ORAL | Status: DC | PRN
  Administered 2019-07-13: 650 mg via ORAL
  Filled 2019-07-12: qty 2

## 2019-07-12 MED ORDER — HEPARIN SODIUM (PORCINE) 5000 UNIT/ML IJ SOLN
5000.0000 [IU] | Freq: Three times a day (TID) | INTRAMUSCULAR | Status: DC
  Administered 2019-07-12 – 2019-07-14 (×7): 5000 [IU] via SUBCUTANEOUS
  Filled 2019-07-12 (×7): qty 1

## 2019-07-12 MED ORDER — ONDANSETRON HCL 4 MG/2ML IJ SOLN
4.0000 mg | Freq: Once | INTRAMUSCULAR | Status: AC
  Administered 2019-07-12: 4 mg via INTRAVENOUS
  Filled 2019-07-12: qty 2

## 2019-07-12 MED ORDER — CARVEDILOL 25 MG PO TABS
25.0000 mg | ORAL_TABLET | Freq: Two times a day (BID) | ORAL | Status: DC
  Administered 2019-07-12 – 2019-07-14 (×5): 25 mg via ORAL
  Filled 2019-07-12: qty 1
  Filled 2019-07-12: qty 2
  Filled 2019-07-12 (×3): qty 1

## 2019-07-12 MED ORDER — CLONIDINE HCL 0.1 MG PO TABS
0.3000 mg | ORAL_TABLET | Freq: Three times a day (TID) | ORAL | Status: DC
  Administered 2019-07-12 – 2019-07-14 (×7): 0.3 mg via ORAL
  Filled 2019-07-12 (×7): qty 3

## 2019-07-12 MED ORDER — HYDRALAZINE HCL 25 MG PO TABS
100.0000 mg | ORAL_TABLET | Freq: Three times a day (TID) | ORAL | Status: DC
  Administered 2019-07-12 – 2019-07-14 (×8): 100 mg via ORAL
  Filled 2019-07-12 (×10): qty 4

## 2019-07-12 MED ORDER — HYDROCERIN EX CREA
TOPICAL_CREAM | Freq: Every day | CUTANEOUS | Status: DC
  Administered 2019-07-13: 1 via TOPICAL
  Filled 2019-07-12: qty 113

## 2019-07-12 MED ORDER — OXYCODONE HCL 5 MG PO TABS
5.0000 mg | ORAL_TABLET | ORAL | Status: DC | PRN
  Administered 2019-07-12 – 2019-07-14 (×9): 5 mg via ORAL
  Filled 2019-07-12 (×9): qty 1

## 2019-07-12 MED ORDER — CLINDAMYCIN PHOSPHATE 600 MG/50ML IV SOLN
600.0000 mg | Freq: Three times a day (TID) | INTRAVENOUS | Status: DC
Start: 2019-07-12 — End: 2019-07-12

## 2019-07-12 MED ORDER — COLLAGENASE 250 UNIT/GM EX OINT
TOPICAL_OINTMENT | Freq: Every day | CUTANEOUS | Status: DC
  Administered 2019-07-13: 1 via TOPICAL
  Filled 2019-07-12: qty 30

## 2019-07-12 MED ORDER — SODIUM CHLORIDE 0.9 % IV SOLN
INTRAVENOUS | Status: DC | PRN
  Administered 2019-07-12: 250 mL via INTRAVENOUS

## 2019-07-12 MED ORDER — MORPHINE SULFATE (PF) 4 MG/ML IV SOLN
4.0000 mg | Freq: Once | INTRAVENOUS | Status: AC
  Administered 2019-07-12: 4 mg via INTRAVENOUS
  Filled 2019-07-12: qty 1

## 2019-07-12 NOTE — ED Notes (Signed)
Md paged for patients hight BP.   hydrALAZINE (APRESOLINE) tablet 100 mg Given @0622  this morning.
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
## 2019-07-12 NOTE — ED Provider Notes (Signed)
[place] DEPT Provider Note   CSN: [id number] Arrival date & time: 07/11/19  2353     History Chief Complaint  Patient presents with  . Abscess    Kelly Novak is a 49 y.o. female with past medical history significant for anemia, right AKA, bipolar disorder, sickle cell trait, hypertension, chronic systolic heart failure, polysubstance abuse, CVA presents to emergency room today with chief complaint of worsening left leg wound.  Patient states she first noticed the wound x6 months ago, started as a small red circle on her left shin.  The wound progressively worsened over time.  She saw her PCP recently and was prescribed doxycycline however her insurance did not cover the prescription so she was switched to another medication.  She does not remember what medication that was.  She just finished that medication x 3 days ago.  She states despite taking the medicine the wound looks worse.  She tried cleaning it with Betadine prior to arrival which did not help the pain.  She states that has had purulent drainage in the past but none in the last few days.  She states the pain is 10 of 10 in severity and is worse when anything touches her leg.  She denies any history of diabetes, fever, chills, shortness of breath, abdominal pain,, nausea, vomiting.   Past Medical History:  Diagnosis Date  . Anemia   . Anxiety   . Arthritis   . Bipolar 1 disorder ([place])   . Blood dyscrasia    sickle cell trait  . Chronic back pain   . Chronic systolic (congestive) heart failure ([place])   . GERD (gastroesophageal reflux disease)   . Hypertension   . Obesity   . Polysubstance abuse ([place])   . Shortness of breath    on exertion due to weight  . Stroke [facility])     Patient Active Problem List   Diagnosis Date Noted  . AKI (acute kidney injury) ([place])   . Nausea   . Cocaine abuse ([place])   . Marijuana abuse   . Morbid obesity ([place])   . Hypertensive urgency 10/14/2016  . S/P  AKA (above knee amputation) unilateral, right ([place]) 09/17/2016  . Bipolar disorder ([place]) 08/31/2016  . History of stroke 08/07/2016  . Polysubstance abuse ([place])   . Chronic systolic (congestive) heart failure ([place])   . Depression 11/03/2015  . GERD (gastroesophageal reflux disease) 11/03/2015  . Pain in the abdomen 11/03/2015  . Chronic back pain   . Uterine fibroids 08/10/2013  . ASCUS pap with negative HRHPV 08/10/2013  . Pes planus (flat feet) 09/02/2012  . Anemia 09/02/2012  . Smoker 09/02/2012    Past Surgical History:  Procedure Laterality Date  . AMPUTATION Right 09/04/2016   Procedure: Right Above Knee Amputation;  Surgeon: Newt Minion, MD;  Location: [facility];  Service: Orthopedics;  Laterality: Right;  . CESAREAN SECTION    . CHOLECYSTECTOMY    . HYSTEROSCOPY N/A 08/23/2013   Procedure: HYSTEROSCOPY D&C WITH HYDROTHERMAL ABLATION;  Surgeon: Osborne Oman, MD;  Location: [facility];  Service: Gynecology;  Laterality: N/A;  . I & D EXTREMITY Right 09/02/2016   Procedure: IRRIGATION AND DEBRIDEMENT EXTREMITY;  Surgeon: Newt Minion, MD;  Location: [facility];  Service: Orthopedics;  Laterality: Right;  . TUBAL LIGATION       OB History    Gravida  4   Para  4   Term  4   Preterm  AB      Living  2     SAB      TAB      Ectopic      Multiple      Live Births  4           Family History  Problem Relation Age of Onset  . CAD Father   . Heart failure Brother   . Hypertension Brother   . CAD Paternal Aunt     Social History   Tobacco Use  . Smoking status: Current Every Day Smoker    Packs/day: 0.50    Years: 14.00    Pack years: 7.00    Types: Cigarettes  . Smokeless tobacco: Never Used  Substance Use Topics  . Alcohol use: Not Currently  . Drug use: Yes    Types: Marijuana, Cocaine    Home Medications Prior to Admission medications   Medication Sig Start Date End Date Taking? Authorizing Provider  albuterol (VENTOLIN HFA) 108 (90  Base) MCG/ACT inhaler Inhale 2 puffs into the lungs every 4 (four) hours as needed for wheezing.  11/09/18  Yes [provider]  amLODipine (NORVASC) 10 MG tablet Take 10 mg by mouth daily. 11/19/18  Yes [provider]  carvedilol (COREG) 25 MG tablet Take 25 mg by mouth 2 (two) times daily. 12/19/18  Yes [provider]  cloNIDine (CATAPRES) 0.3 MG tablet Take 1 tablet (0.3 mg total) by mouth 3 (three) times daily. 12/08/17 07/12/19 Yes Hayden Rasmussen, MD  gabapentin (NEURONTIN) 300 MG capsule Take 300 mg by mouth 3 (three) times daily. 12/06/18  Yes [provider]  hydrALAZINE (APRESOLINE) 100 MG tablet Take 1 tablet (100 mg total) by mouth every 8 (eight) hours. Patient taking differently: Take 100 mg by mouth 2 (two) times daily.  09/22/17  Yes Caryl Ada K, PA-C  Ibuprofen-diphenhydrAMINE HCl (ADVIL PM) 200-25 MG CAPS Take 2 tablets by mouth at bedtime as needed (sleep/pain).   Yes [provider]  PARoxetine (PAXIL) 20 MG tablet Take 20 mg by mouth daily. 12/06/18  Yes [provider]  Vitamin D, Ergocalciferol, (DRISDOL) 1.25 MG (50000 UT) CAPS capsule Take 50,000 Units by mouth every Sunday.   Yes [provider]  VRAYLAR capsule Take 1.5 mg by mouth daily. 11/21/18  Yes [provider]  methocarbamol (ROBAXIN) 500 MG tablet Take 1 tablet (500 mg total) by mouth 2 (two) times daily. Patient not taking: Reported on 07/12/2019 12/31/18   Recardo Evangelist, PA-C  metoprolol succinate (TOPROL-XL) 100 MG 24 hr tablet Take 1 tablet (100 mg total) by mouth daily. Take with or immediately following a meal. Patient not taking: Reported on 12/31/2018 09/22/17 12/31/18  Fransico Meadow, PA-C    Allergies    Tape  Review of Systems   Review of Systems All other systems are reviewed and are negative for acute change except as noted in the HPI.  Physical Exam Updated Vital Signs BP (!) 185/101 (BP Location: Right Arm)   Pulse  97   Temp 98.5 F (36.9 C) (Oral)   Resp (!) 21   LMP 08/06/2013   SpO2 100%   Physical Exam Vitals and nursing note reviewed.  Constitutional:      General: She is not in acute distress.    Appearance: She is obese. She is not ill-appearing.  HENT:     Head: Normocephalic and atraumatic.     Right Ear: Tympanic membrane and external ear normal.  Left Ear: Tympanic membrane and external ear normal.     Nose: Nose normal.     Mouth/Throat:     Mouth: Mucous membranes are moist.     Pharynx: Oropharynx is clear.  Eyes:     General: No scleral icterus.       Right eye: No discharge.        Left eye: No discharge.     Extraocular Movements: Extraocular movements intact.     Conjunctiva/sclera: Conjunctivae normal.     Pupils: Pupils are equal, round, and reactive to light.  Neck:     Vascular: No JVD.  Cardiovascular:     Rate and Rhythm: Normal rate and regular rhythm.     Pulses:          Radial pulses are 2+ on the right side and 2+ on the left side.       Dorsalis pedis pulses are detected w/ Doppler on the left side.     Heart sounds: Normal heart sounds.  Pulmonary:     Comments: Lungs clear to auscultation in all fields. Symmetric chest rise. No wheezing, rales, or rhonchi. Abdominal:     Comments: Abdomen is soft, non-distended, and non-tender in all quadrants. No rigidity, no guarding. No peritoneal signs.  Musculoskeletal:        General: Normal range of motion.     Cervical back: Normal range of motion.     Comments: Right AKA  Skin:    General: Skin is warm and dry.     Capillary Refill: Capillary refill takes less than 2 seconds.     Comments: Please see media below.  Left shin and foot are extremely tender to the lightest palpation.  There is no active drainage from the wound.  It appears to have a scab.  Wound measures 2x3 cm.  There is erythema on on anterior shin that extends to the calf and to her foot.  Skin is warm to the touch.  Neurological:      Mental Status: She is oriented to person, place, and time.     GCS: GCS eye subscore is 4. GCS verbal subscore is 5. GCS motor subscore is 6.     Comments: Fluent speech, no facial droop.  Psychiatric:        Behavior: Behavior normal.       ED Results / Procedures / Treatments   Labs (all labs ordered are listed, but only abnormal results are displayed) Labs Reviewed  CBC WITH DIFFERENTIAL/PLATELET - Abnormal; Notable for the following components:      Result Value   RBC 6.19 (*)    Hemoglobin 11.5 (*)    MCV 64.0 (*)    MCH 18.6 (*)    MCHC 29.0 (*)    RDW 19.3 (*)    All other components within normal limits  BASIC METABOLIC PANEL - Abnormal; Notable for the following components:   Calcium 8.4 (*)    All other components within normal limits  CULTURE, BLOOD (ROUTINE X 2)  CULTURE, BLOOD (ROUTINE X 2)  LACTIC ACID, PLASMA    EKG None  Radiology DG Tibia/Fibula Left Port  Result Date: 07/12/2019 CLINICAL DATA:  Cellulitis EXAM: PORTABLE LEFT TIBIA AND FIBULA - 2 VIEW COMPARISON:  12/07/2017 FINDINGS: There are advanced degenerative changes of the left knee, greatest within the medial compartment. There is nonspecific soft tissue swelling about the lower extremity with findings concerning for ulceration laterally at the level of the mid to distal fibula.  IMPRESSION: 1. There is nonspecific soft tissue swelling about the lower extremity which can be seen in patients with cellulitis. 2. There is a soft tissue ulceration at the level of the mid to distal fibula. 3. Advanced degenerative changes are noted of the left knee. Electronically Signed   By: Constance Holster M.D.   On: 07/12/2019 03:02    Procedures Procedures (including critical care time)  Medications Ordered in ED Medications  clindamycin (CLEOCIN) IVPB 600 mg (has no administration in time range)  morphine 4 MG/ML injection 4 mg (4 mg Intravenous Given 07/12/19 0216)  ondansetron (ZOFRAN) injection 4 mg (4 mg  Intravenous Given 07/12/19 0216)    ED Course  I have reviewed the triage vital signs and the nursing notes.  Pertinent labs & imaging results that were available during my care of the patient were reviewed by me and considered in my medical decision making (see chart for details).    MDM Rules/Calculators/A&P                      49 year old female presenting with left leg wound x6 months.  Patient presents awake, alert, hemodynamically stable, afebrile, non toxic.  Patient was noted to be tachycardic for EMS to 113.  On exam she has erythema on left shin extending to left foot.  No active drainage from the wound.  She is extremely tender to the lightest palpation.  DP pulse found with Doppler.  CBC shows no leukocytosis, hemoglobin 11.5 consistent with her baseline.  There is no severe electrolyte derangement, no renal insufficiency.  Lactic acid is within normal range.  Blood cultures are pending.  X-ray of left tib-fib viewed by me shows cellulitis with a nonspecific soft tissue swelling.  There also appears to be soft tissue ulceration of the lateral of the mid to distal fibula.  Patient has failed outpatient therapy will admit for IV antibiotics.  Patient started on Clindamycin. This case was discussed with Dr. Wyvonnia Dusky who has seen the patient and agrees with plan to admit.  Spoke with Dr. Vallery Ridge with hospitalist service who agrees to assume care of patient and bring into the hospital for further evaluation and management.      Portions of this note were generated with [employer]. Dictation errors may occur despite best attempts at proofreading.   Final Clinical Impression(s) / ED Diagnoses Final diagnoses:  Wound infection  Cellulitis of left lower extremity    Rx / DC Orders ED Discharge Orders    None       Cherre Robins, PA-C 07/12/19 [id number]    Ezequiel Essex, MD 07/12/19 0930

## 2019-07-12 NOTE — H&P (Addendum)
History and Physical    MADDIX KLIEWER IDP:[id number] DOB: [birth year] DOA: 07/11/2019  PCP: Patient, No Pcp Per (Confirm with patient/family/NH records and if not entered, this has to be entered at [facility] point of entry) Patient coming from: Home  I have personally briefly reviewed patient's old medical records in [place]  Chief Complaint: Swelling and pain of left lower extremity.  HPI: LAVIDA PATCH is a 49 y.o. 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  Patient reports that in January 2021 developed an ulcer of left lower extremity that grew in size and got worse.  She was prescribed 10-day course of Keflex June 13 2019 that she used with some improvement in wound with formation of eschar.  However for the past few days seems like the ulcer has gotten worse given worsening pain.  She denies any significant discharge.  Denies any fever chills.  Personal and social history.   Patient widowed with two children. She used to work as a [employer]ar[employer] manager. Active smoker 0.5 pack/day for the past 33 years. Denies any EtOH use.  Reports marijuana/cocaine use.  Also reports using benzos/Xanax off the street.   Family history: Dad with HTN and CAD.  Mother with EtOH abuse.  Brother had diabetes.   ED Course:  Vital signs 195/101, HR 97, RR 21, SPO2 90s on room air. Labs with WBC 9.7, Hb 11.5, platelets 323 Sodium 138, potassium 3.9, BUN 15, creatinine 0.5 Lactic acid 1.7  -X-ray left tibia/fibula showed nonspecific soft tissue swelling about the lower extremity consistent with cellulitis.  No evidence of osteomyelitis. Blood cultures pending  Review of Systems: As per HPI otherwise 10 point review of systems negative.  Review of Systems  Constitutional: Negative for chills, diaphoresis, fever, malaise/fatigue and weight loss.  HENT: Negative.   Eyes: Negative.    Respiratory: Negative.   Cardiovascular: Negative.   Gastrointestinal: Negative.   Genitourinary: Negative.   Musculoskeletal: Positive for joint pain and myalgias. Negative for back pain, falls and neck pain.  Skin: Negative.   Neurological: Negative.   Endo/Heme/Allergies: Negative.   Psychiatric/Behavioral: Negative.     Past Medical History:  Diagnosis Date  . Anemia   . Anxiety   . Arthritis   . Bipolar 1 disorder ([place])   . Blood dyscrasia    sickle cell trait  . Chronic back pain   . Chronic systolic (congestive) heart failure ([place])   . GERD (gastroesophageal reflux disease)   . Hypertension   . Obesity   . Polysubstance abuse ([place])   . Shortness of breath    on exertion due to weight  . Stroke [facility])     Past Surgical History:  Procedure Laterality Date  . AMPUTATION Right 09/04/2016   Procedure: Right Above Knee Amputation;  Surgeon: Newt Minion, MD;  Location: [place];  Service: Orthopedics;  Laterality: Right;  . CESAREAN SECTION    . CHOLECYSTECTOMY    . HYSTEROSCOPY N/A 08/23/2013   Procedure: HYSTEROSCOPY D&C WITH HYDROTHERMAL ABLATION;  Surgeon: Osborne Oman, MD;  Location: [facility];  Service: Gynecology;  Laterality: N/A;  . I & D EXTREMITY Right 09/02/2016   Procedure: IRRIGATION AND DEBRIDEMENT EXTREMITY;  Surgeon: Newt Minion, MD;  Location: [place];  Service: Orthopedics;  Laterality: Right;  . TUBAL LIGATION       reports that she has been smoking cigarettes. She has  a 7.00 pack-year smoking history. She has never used smokeless tobacco. She reports previous alcohol use. She reports current drug use. Drugs: Marijuana and Cocaine.  Allergies  Allergen Reactions  . Tape Itching and Rash    "took skin off" please use wrap or paper tape if possible    Family History  Problem Relation Age of Onset  . CAD Father   . Heart failure Brother   . Hypertension Brother   . CAD Paternal Aunt    Prior to Admission medications   Medication Sig Start  Date End Date Taking? Authorizing Provider  albuterol (VENTOLIN HFA) 108 (90 Base) MCG/ACT inhaler Inhale 2 puffs into the lungs every 4 (four) hours as needed for wheezing.  11/09/18  Yes [provider]  amLODipine (NORVASC) 10 MG tablet Take 10 mg by mouth daily. 11/19/18  Yes [provider]  carvedilol (COREG) 25 MG tablet Take 25 mg by mouth 2 (two) times daily. 12/19/18  Yes [provider]  cloNIDine (CATAPRES) 0.3 MG tablet Take 1 tablet (0.3 mg total) by mouth 3 (three) times daily. 12/08/17 07/12/19 Yes Hayden Rasmussen, MD  gabapentin (NEURONTIN) 300 MG capsule Take 300 mg by mouth 3 (three) times daily. 12/06/18  Yes [provider]  hydrALAZINE (APRESOLINE) 100 MG tablet Take 1 tablet (100 mg total) by mouth every 8 (eight) hours. Patient taking differently: Take 100 mg by mouth 2 (two) times daily.  09/22/17  Yes Caryl Ada K, PA-C  Ibuprofen-diphenhydrAMINE HCl (ADVIL PM) 200-25 MG CAPS Take 2 tablets by mouth at bedtime as needed (sleep/pain).   Yes [provider]  PARoxetine (PAXIL) 20 MG tablet Take 20 mg by mouth daily. 12/06/18  Yes [provider]  Vitamin D, Ergocalciferol, (DRISDOL) 1.25 MG (50000 UT) CAPS capsule Take 50,000 Units by mouth every Sunday.   Yes [provider]  VRAYLAR capsule Take 1.5 mg by mouth daily. 11/21/18  Yes [provider]  methocarbamol (ROBAXIN) 500 MG tablet Take 1 tablet (500 mg total) by mouth 2 (two) times daily. Patient not taking: Reported on 07/12/2019 12/31/18   Recardo Evangelist, PA-C  metoprolol succinate (TOPROL-XL) 100 MG 24 hr tablet Take 1 tablet (100 mg total) by mouth daily. Take with or immediately following a meal. Patient not taking: Reported on 12/31/2018 09/22/17 12/31/18  Fransico Meadow, PA-C    Physical Exam: Vitals:   07/12/19 0004  BP: (!) 185/101  Pulse: 97  Resp: (!) 21  Temp: 98.5 F (36.9 C)  TempSrc: Oral  SpO2: 100%    Constitutional:  NAD, calm, comfortable Vitals:   07/12/19 0004  BP: (!) 185/101  Pulse: 97  Resp: (!) 21  Temp: 98.5 F (36.9 C)  TempSrc: Oral  SpO2: 100%   Physical Exam  Constitutional: She is oriented to person, place, and time. She appears well-developed and well-nourished. No distress.  Morbidly obese in no apparent distress.  HENT:  Head: Normocephalic and atraumatic.  Right Ear: External ear normal.  Left Ear: External ear normal.  Mouth/Throat: No oropharyngeal exudate.  Eyes: Pupils are equal, round, and reactive to light. Conjunctivae and EOM are normal. Right eye exhibits no discharge. Left eye exhibits discharge.  Neck: No JVD present. No tracheal deviation present. No thyromegaly present.  Cardiovascular: Normal rate and regular rhythm. Exam reveals no gallop and no friction rub.  No murmur heard. Pulmonary/Chest: Effort normal. No respiratory distress. She has wheezes. She has no rales. She exhibits no tenderness.  Abdominal: Soft.  Bowel sounds are normal.  Musculoskeletal:        General: No tenderness or edema. Normal range of motion.     Comments: 3 to 4 cm ulcer of the lateral/anterior aspect of left lower extremity.  No active discharge however area of surrounding redness noted.  Neurological: She is alert and oriented to person, place, and time. She has normal reflexes. She displays normal reflexes. No cranial nerve deficit. She exhibits normal muscle tone. Coordination normal.  Skin: Skin is warm and dry.  Psychiatric: She has a normal mood and affect. Her behavior is normal.  Vitals reviewed.  Labs on Admission: I have personally reviewed following labs and imaging studies  CBC: Recent Labs  Lab 07/12/19 0222  WBC 9.7  NEUTROABS 6.1  HGB 11.5*  HCT 39.6  MCV 64.0*  PLT 825   Basic Metabolic Panel: Recent Labs  Lab 07/12/19 0222  NA 138  K 3.9  CL 102  CO2 26  GLUCOSE 92  BUN 15  CREATININE 0.59  CALCIUM 8.4*   GFR: CrCl cannot be calculated (Unknown  ideal weight.). Liver Function Tests: No results for input(s): AST, ALT, ALKPHOS, BILITOT, PROT, ALBUMIN in the last 168 hours. No results for input(s): LIPASE, AMYLASE in the last 168 hours. No results for input(s): AMMONIA in the last 168 hours. Coagulation Profile: No results for input(s): INR, PROTIME in the last 168 hours. Cardiac Enzymes: No results for input(s): CKTOTAL, CKMB, CKMBINDEX, TROPONINI in the last 168 hours. BNP (last 3 results) No results for input(s): PROBNP in the last 8760 hours. HbA1C: No results for input(s): HGBA1C in the last 72 hours. CBG: No results for input(s): GLUCAP in the last 168 hours. Lipid Profile: No results for input(s): CHOL, HDL, LDLCALC, TRIG, CHOLHDL, LDLDIRECT in the last 72 hours. Thyroid Function Tests: No results for input(s): TSH, T4TOTAL, FREET4, T3FREE, THYROIDAB in the last 72 hours. Anemia Panel: No results for input(s): VITAMINB12, FOLATE, FERRITIN, TIBC, IRON, RETICCTPCT in the last 72 hours. Urine analysis:    Component Value Date/Time   COLORURINE YELLOW 12/31/2018 1051   APPEARANCEUR CLEAR 12/31/2018 1051   LABSPEC 1.039 (H) 12/31/2018 1051   PHURINE 6.0 12/31/2018 1051   GLUCOSEU NEGATIVE 12/31/2018 1051   HGBUR NEGATIVE 12/31/2018 1051   BILIRUBINUR NEGATIVE 12/31/2018 1051   KETONESUR NEGATIVE 12/31/2018 1051   PROTEINUR NEGATIVE 12/31/2018 1051   UROBILINOGEN 0.2 06/06/2013 1815   NITRITE NEGATIVE 12/31/2018 1051   LEUKOCYTESUR NEGATIVE 12/31/2018 1051    Radiological Exams on Admission: DG Tibia/Fibula Left Port  Result Date: 07/12/2019 CLINICAL DATA:  Cellulitis EXAM: PORTABLE LEFT TIBIA AND FIBULA - 2 VIEW COMPARISON:  12/07/2017 FINDINGS: There are advanced degenerative changes of the left knee, greatest within the medial compartment. There is nonspecific soft tissue swelling about the lower extremity with findings concerning for ulceration laterally at the level of the mid to distal fibula. IMPRESSION: 1.  There is nonspecific soft tissue swelling about the lower extremity which can be seen in patients with cellulitis. 2. There is a soft tissue ulceration at the level of the mid to distal fibula. 3. Advanced degenerative changes are noted of the left knee. Electronically Signed   By: Constance Holster M.D.   On: 07/12/2019 03:02    EKG: Independently not reviewed.  Assessment/Plan Active Problems:   GERD (gastroesophageal reflux disease)   Polysubstance abuse ([place])   Cellulitis   Hypertensive urgency   #Cellulitis left lower extremity not responding to outpatient therapy. -We'll start on clindamycin. -  Follow-up ESR, CRP. -No evidence of osteomyelitis on x-ray.   #HTN poorly controlled -Norvasc -Coreg -Clonidine 0.3 three times daily -Hydralazine  #HFrEF -Toprol-XL  #Neuropathy -Gabapentin   DVT prophylaxis: Subcu heparin   code Status: Full code Family Communication: None.    Disposition Plan: Home.    Consults called: None. Admission status: Inpatient.   Korra Christine MD [facility]   If 7PM-7AM, please contact night-coverage [email or web address] Password [facility]  07/12/2019, 4:54 AM

## 2019-07-12 NOTE — Consult Note (Signed)
[facility] Nurse Consult Note: Reason for Consult: Chronic, nonhealing wound to LLE Wound type: full thickness Pressure Injury POA: N/A Measurement: Left lateral LE: 3.5cm x 2cm with depth unable to be determined due to the presence of nonviable tissue Left posterior LE: 1cm x 1cm with depth unable to be determined due to the presence of nonviable tissue Wound bed: 80% necrotic, 20% red Drainage (amount, consistency, odor) none Periwound:eith evidence of previous wounding, wound healing (scars) Dressing procedure/placement/frequency: I will provide guidance for Nursing for the care of the LE using a soap and water cleanse, rinse and pat dry followed by the application of a moisturizing agent, Eucerin. The wound will be treated with an enzymatic debriding agent, collagenase (Santyl) once daily to reveal the true depth of the ulcer and to begin to treat the wound bed.  [facility] nursing team will not follow, but will remain available to this patient, the nursing and medical teams.  Please re-consult if needed. Thanks, Maudie Flakes, MSN, RN, [place], Arther Abbott  Pager# [phone]

## 2019-07-12 NOTE — ED Notes (Signed)
Patient given meal tray and repositioned in bed to upright sitting position. Patient feeding herself with no assistance needed.    Left leg elevated on pillow.  10/10 pain in left lower leg.  Pain medication given.   Will continue to monitor BP trend. BP medications given per MD.

## 2019-07-13 DIAGNOSIS — K219 Gastro-esophageal reflux disease without esophagitis: Secondary | ICD-10-CM

## 2019-07-13 DIAGNOSIS — F1910 Other psychoactive substance abuse, uncomplicated: Secondary | ICD-10-CM

## 2019-07-13 DIAGNOSIS — I10 Essential (primary) hypertension: Secondary | ICD-10-CM

## 2019-07-13 LAB — BASIC METABOLIC PANEL
Anion gap: 9 (ref 5–15)
BUN: 12 mg/dL (ref 6–20)
CO2: 25 mmol/L (ref 22–32)
Calcium: 7.9 mg/dL — ABNORMAL LOW (ref 8.9–10.3)
Chloride: 97 mmol/L — ABNORMAL LOW (ref 98–111)
Creatinine, Ser: 0.76 mg/dL (ref 0.44–1.00)
GFR calc Af Amer: >60 mL/min (ref >60)
GFR calc non Af Amer: >60 mL/min (ref >60)
Glucose, Bld: 112 mg/dL — ABNORMAL HIGH (ref 70–99)
Potassium: 3.8 mmol/L (ref 3.5–5.1)
Sodium: 131 mmol/L — ABNORMAL LOW (ref 135–145)

## 2019-07-13 LAB — CBC
HCT: 39 % (ref 36.0–46.0)
Hemoglobin: 11.2 g/dL — ABNORMAL LOW (ref 12.0–15.0)
MCH: 18.5 pg — ABNORMAL LOW (ref 26.0–34.0)
MCHC: 28.7 g/dL — ABNORMAL LOW (ref 30.0–36.0)
MCV: 64.4 fL — ABNORMAL LOW (ref 80.0–100.0)
Platelets: 313 10*3/uL (ref 150–400)
RBC: 6.06 MIL/uL — ABNORMAL HIGH (ref 3.87–5.11)
RDW: 19.2 % — ABNORMAL HIGH (ref 11.5–15.5)
WBC: 8.2 10*3/uL (ref 4.0–10.5)
nRBC: 0.2 % (ref 0.0–0.2)
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
## 2019-07-14 DIAGNOSIS — S81802A Unspecified open wound, left lower leg, initial encounter: Secondary | ICD-10-CM

## 2019-07-14 DIAGNOSIS — Z89611 Acquired absence of right leg above knee: Secondary | ICD-10-CM

## 2019-07-14 LAB — CBC
HCT: 38.9 % (ref 36.0–46.0)
Hemoglobin: 11.3 g/dL — ABNORMAL LOW (ref 12.0–15.0)
MCH: 18.5 pg — ABNORMAL LOW (ref 26.0–34.0)
MCHC: 29 g/dL — ABNORMAL LOW (ref 30.0–36.0)
MCV: 63.7 fL — ABNORMAL LOW (ref 80.0–100.0)
Platelets: 323 10*3/uL (ref 150–400)
RBC: 6.11 MIL/uL — ABNORMAL HIGH (ref 3.87–5.11)
RDW: 18.8 % — ABNORMAL HIGH (ref 11.5–15.5)
WBC: 7.6 10*3/uL (ref 4.0–10.5)
nRBC: 0 % (ref 0.0–0.2)

## 2019-07-14 LAB — BASIC METABOLIC PANEL
Anion gap: 10 (ref 5–15)
BUN: 16 mg/dL (ref 6–20)
CO2: 28 mmol/L (ref 22–32)
Calcium: 8.5 mg/dL — ABNORMAL LOW (ref 8.9–10.3)
Chloride: 97 mmol/L — ABNORMAL LOW (ref 98–111)
Creatinine, Ser: 0.76 mg/dL (ref 0.44–1.00)
GFR calc Af Amer: >60 mL/min (ref >60)
GFR calc non Af Amer: >60 mL/min (ref >60)
Glucose, Bld: 137 mg/dL — ABNORMAL HIGH (ref 70–99)
Potassium: 3.7 mmol/L (ref 3.5–5.1)
Sodium: 135 mmol/L (ref 135–145)

## 2019-07-14 MED ORDER — CLONIDINE HCL 0.3 MG PO TABS: 0.3000 mg | ORAL_TABLET | Freq: Three times a day (TID) | ORAL | 0 refills | Status: AC

## 2019-07-14 MED ORDER — HYDROCHLOROTHIAZIDE 25 MG PO TABS: 25.0000 mg | ORAL_TABLET | Freq: Every day | ORAL | 1 refills | Status: AC

## 2019-07-14 MED ORDER — COLLAGENASE 250 UNIT/GM EX OINT: TOPICAL_OINTMENT | Freq: Every day | CUTANEOUS | 0 refills | Status: DC

## 2019-07-14 MED ORDER — TRAMADOL HCL 50 MG PO TABS: 50.0000 mg | ORAL_TABLET | Freq: Four times a day (QID) | ORAL | 0 refills | Status: AC | PRN

## 2019-07-14 MED ORDER — CLINDAMYCIN HCL 300 MG PO CAPS
600.0000 mg | ORAL_CAPSULE | Freq: Three times a day (TID) | ORAL | 0 refills | Status: AC
Start: 2019-07-14 — End: 2019-07-17

## 2019-07-14 MED ORDER — HYDROCERIN EX CREA: 1.0000 "application " | TOPICAL_CREAM | Freq: Every day | CUTANEOUS | 0 refills | Status: AC

## 2019-07-14 NOTE — TOC Initial Note (Signed)
Transition of Care [facility]) - Initial/Assessment Note    Patient Details  Name: Kelly Novak MRN: [id number] Date of Birth: 1970-07-19  Transition of Care [facility]) CM/SW Contact:    Lennart Pall, LCSW Phone Number: 07/14/2019, 10:23 AM  Clinical Narrative:                 Met briefly with pt to review potential d/c needs.  Pt confirms that her adult son is at home, however, is working days.  She reports that her PCP for "the past year or so" is Dr. Jeanie Cooks and have added this to her chart.  Await word from MD if may need [employer] for wound care.  PT already has a PCS aide in place and will resume care when she returns home.  Expected Discharge Plan: Home/Self Care(vs. HH) Barriers to Discharge: Continued Medical Work up   Patient Goals and CMS Choice Patient states their goals for this hospitalization and ongoing recovery are:: hopes to go home today      Expected Discharge Plan and Services Expected Discharge Plan: Home/Self Care(vs. HH)       Living arrangements for the past 2 months: Single Family Home                                      Prior Living Arrangements/Services Living arrangements for the past 2 months: Single Family Home Lives with:: Adult Children Patient language and need for interpreter reviewed:: Yes Do you feel safe going back to the place where you live?: Yes      Need for Family Participation in Patient Care: No (Comment) Care giver support system in place?: Yes (comment)   Criminal Activity/Legal Involvement Pertinent to Current Situation/Hospitalization: No - Comment as needed  Activities of Daily Living Home Assistive Devices/Equipment: Eyeglasses, Crutches, Bedside commode/3-in-1, Wheelchair, Environmental consultant (specify type)(standard walker, manual wheelchair) ADL Screening (condition at time of admission) Patient's cognitive ability adequate to safely complete daily activities?: Yes Is the patient deaf or have difficulty hearing?: No Does the patient have  difficulty seeing, even when wearing glasses/contacts?: No Does the patient have difficulty concentrating, remembering, or making decisions?: No Patient able to express need for assistance with ADLs?: Yes Does the patient have difficulty dressing or bathing?: Yes Independently performs ADLs?: No Communication: Independent Dressing (OT): Independent Grooming: Independent Feeding: Independent Bathing: Needs assistance(son helps with shower) Is this a change from baseline?: Pre-admission baseline Toileting: Needs assistance Is this a change from baseline?: Pre-admission baseline In/Out Bed: Independent, Needs assistance Is this a change from baseline?: Pre-admission baseline Walks in Home: Needs assistance Is this a change from baseline?: Pre-admission baseline Does the patient have difficulty walking or climbing stairs?: Yes(Right AKA) Weakness of Legs: Left Weakness of Arms/Hands: None  Permission Sought/Granted Permission sought to share information with : Family Supports    Share Information with NAME: Kelly Novak     Permission granted to share info w Relationship: son  Permission granted to share info w Contact Information: [phone]  Emotional Assessment Appearance:: Appears stated age Attitude/Demeanor/Rapport: Engaged, Gracious Affect (typically observed): Pleasant Orientation: : Oriented to Self, Oriented to Place, Oriented to  Time, Oriented to Situation Alcohol / Substance Use: Not Applicable Psych Involvement: No (comment)  Admission diagnosis:  Cellulitis [L03.90] Wound infection [T14.8XXA, L08.9] Cellulitis of left lower extremity [L03.116] Patient Active Problem List   Diagnosis Date Noted  . AKI (acute kidney injury) ([place])   .  Nausea   . Cocaine abuse ([place])   . Marijuana abuse   . Morbid obesity ([place])   . Hypertensive urgency 10/14/2016  . S/P AKA (above knee amputation) unilateral, right ([place]) 09/17/2016  . Cellulitis 08/31/2016  . Bipolar disorder  ([place]) 08/31/2016  . History of stroke 08/07/2016  . Polysubstance abuse ([place])   . Chronic systolic (congestive) heart failure ([place])   . Depression 11/03/2015  . GERD (gastroesophageal reflux disease) 11/03/2015  . Pain in the abdomen 11/03/2015  . Chronic back pain   . Uterine fibroids 08/10/2013  . ASCUS pap with negative HRHPV 08/10/2013  . Pes planus (flat feet) 09/02/2012  . Anemia 09/02/2012  . Smoker 09/02/2012   PCP:  Nolene Ebbs, MD Pharmacy[facility]   [id number] [place] [place] [address]Phone: [phone]: [phone]    Social Determinants of Health (SDOH) Interventions    Readmission Risk Interventions Readmission Risk Prevention Plan 07/14/2019  Transportation Screening Complete  PCP or Specialist Appt within 5-7 Days Complete  Home Care Screening Complete  Medication Review (RN CM) Complete  Some recent data might be hidden

## 2019-07-14 NOTE — TOC Transition Note (Signed)
Transition of Care [facility]) - CM/SW Discharge Note   Patient Details  Name: Kelly Novak MRN: [id number] Date of Birth: 1970/09/05  Transition of [facility]) CM/SW Contact:  Lennart Pall, LCSW Phone Number: 07/14/2019, 3:46 PM   Clinical Narrative:   Pt ready for d/c.  HH arranged (see below). No further needs.    Final next level of care: [place] Barriers to Discharge: Barriers Resolved   Patient Goals and CMS Choice Patient states their goals for this hospitalization and ongoing recovery are:: hopes to go home today      Discharge Placement                       Discharge Plan and Services                          HH Arranged: RN [facility] Agency: [facility] Date [place]: 07/14/19 Time [place]: [place] Representative spoke with at [place]: [place] ([place]) Interventions     Readmission Risk Interventions Readmission Risk Prevention Plan 07/14/2019  Transportation Screening Complete  PCP or Specialist Appt within 5-7 Days Complete  Home Care Screening Complete  Medication Review (RN CM) Complete  Some recent data might be hidden

## 2019-07-14 NOTE — Progress Notes (Signed)
Discharge instructions given to pt and all questions were answered.  
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
## 2019-07-17 LAB — CULTURE, BLOOD (ROUTINE X 2)
Culture: NO GROWTH
Culture: NO GROWTH
Special Requests: ADEQUATE
Special Requests: ADEQUATE

## 2019-07-25 ENCOUNTER — Emergency Department (HOSPITAL_BASED_OUTPATIENT_CLINIC_OR_DEPARTMENT_OTHER)
Admit: 2019-07-25 | Discharge: 2019-07-25 | Disposition: A | Discharge status: Home / Self Care | Payer: Medicare Other | Attending: Emergency Medicine | Admitting: Emergency Medicine

## 2019-07-25 ENCOUNTER — Encounter (HOSPITAL_COMMUNITY): Payer: Self-pay | Admitting: Emergency Medicine

## 2019-07-25 ENCOUNTER — Other Ambulatory Visit: Payer: Self-pay

## 2019-07-25 ENCOUNTER — Emergency Department (HOSPITAL_COMMUNITY)
Admission: EM | Admit: 2019-07-25 | Discharge: 2019-07-25 | Disposition: A | Discharge status: Home / Self Care | Payer: Medicare Other | Attending: Emergency Medicine | Admitting: Emergency Medicine

## 2019-07-25 DIAGNOSIS — Y929 Unspecified place or not applicable: Secondary | ICD-10-CM | POA: Insufficient documentation

## 2019-07-25 DIAGNOSIS — X58XXXA Exposure to other specified factors, initial encounter: Secondary | ICD-10-CM | POA: Diagnosis not present

## 2019-07-25 DIAGNOSIS — Y939 Activity, unspecified: Secondary | ICD-10-CM | POA: Insufficient documentation

## 2019-07-25 DIAGNOSIS — L03116 Cellulitis of left lower limb: Secondary | ICD-10-CM | POA: Insufficient documentation

## 2019-07-25 DIAGNOSIS — F1410 Cocaine abuse, uncomplicated: Secondary | ICD-10-CM | POA: Diagnosis not present

## 2019-07-25 DIAGNOSIS — M79662 Pain in left lower leg: Secondary | ICD-10-CM | POA: Insufficient documentation

## 2019-07-25 DIAGNOSIS — M79609 Pain in unspecified limb: Secondary | ICD-10-CM | POA: Diagnosis not present

## 2019-07-25 DIAGNOSIS — F17210 Nicotine dependence, cigarettes, uncomplicated: Secondary | ICD-10-CM | POA: Diagnosis not present

## 2019-07-25 DIAGNOSIS — I10 Essential (primary) hypertension: Secondary | ICD-10-CM

## 2019-07-25 DIAGNOSIS — L0390 Cellulitis, unspecified: Secondary | ICD-10-CM

## 2019-07-25 DIAGNOSIS — Z8673 Personal history of transient ischemic attack (TIA), and cerebral infarction without residual deficits: Secondary | ICD-10-CM | POA: Diagnosis not present

## 2019-07-25 DIAGNOSIS — Y998 Other external cause status: Secondary | ICD-10-CM | POA: Insufficient documentation

## 2019-07-25 DIAGNOSIS — I5022 Chronic systolic (congestive) heart failure: Secondary | ICD-10-CM | POA: Insufficient documentation

## 2019-07-25 DIAGNOSIS — S8992XA Unspecified injury of left lower leg, initial encounter: Secondary | ICD-10-CM | POA: Diagnosis present

## 2019-07-25 DIAGNOSIS — F1210 Cannabis abuse, uncomplicated: Secondary | ICD-10-CM | POA: Insufficient documentation

## 2019-07-25 DIAGNOSIS — I110 Hypertensive heart disease with heart failure: Secondary | ICD-10-CM | POA: Diagnosis not present

## 2019-07-25 DIAGNOSIS — S81802A Unspecified open wound, left lower leg, initial encounter: Secondary | ICD-10-CM | POA: Insufficient documentation

## 2019-07-25 DIAGNOSIS — Z79899 Other long term (current) drug therapy: Secondary | ICD-10-CM | POA: Diagnosis not present

## 2019-07-25 DIAGNOSIS — M79605 Pain in left leg: Secondary | ICD-10-CM

## 2019-07-25 LAB — CBC WITH DIFFERENTIAL/PLATELET
Abs Immature Granulocytes: 0.17 10*3/uL — ABNORMAL HIGH (ref 0.00–0.07)
Basophils Absolute: 0.1 10*3/uL (ref 0.0–0.1)
Basophils Relative: 1 %
Eosinophils Absolute: 0.4 10*3/uL (ref 0.0–0.5)
Eosinophils Relative: 4 %
HCT: 36.6 % (ref 36.0–46.0)
Hemoglobin: 10.7 g/dL — ABNORMAL LOW (ref 12.0–15.0)
Immature Granulocytes: 2 %
Lymphocytes Relative: 23 %
Lymphs Abs: 2.6 10*3/uL (ref 0.7–4.0)
MCH: 18.7 pg — ABNORMAL LOW (ref 26.0–34.0)
MCHC: 29.2 g/dL — ABNORMAL LOW (ref 30.0–36.0)
MCV: 64 fL — ABNORMAL LOW (ref 80.0–100.0)
Monocytes Absolute: 0.7 10*3/uL (ref 0.1–1.0)
Monocytes Relative: 7 %
Neutro Abs: 7.3 10*3/uL (ref 1.7–7.7)
Neutrophils Relative %: 63 %
Platelets: 297 10*3/uL (ref 150–400)
RBC: 5.72 MIL/uL — ABNORMAL HIGH (ref 3.87–5.11)
RDW: 19.1 % — ABNORMAL HIGH (ref 11.5–15.5)
WBC: 11.3 10*3/uL — ABNORMAL HIGH (ref 4.0–10.5)
nRBC: 0.4 % — ABNORMAL HIGH (ref 0.0–0.2)

## 2019-07-25 LAB — BASIC METABOLIC PANEL
Anion gap: 11 (ref 5–15)
BUN: 11 mg/dL (ref 6–20)
CO2: 22 mmol/L (ref 22–32)
Calcium: 8.3 mg/dL — ABNORMAL LOW (ref 8.9–10.3)
Chloride: 105 mmol/L (ref 98–111)
Creatinine, Ser: 0.7 mg/dL (ref 0.44–1.00)
GFR calc Af Amer: >60 mL/min (ref >60)
GFR calc non Af Amer: >60 mL/min (ref >60)
Glucose, Bld: 119 mg/dL — ABNORMAL HIGH (ref 70–99)
Potassium: 4.1 mmol/L (ref 3.5–5.1)
Sodium: 138 mmol/L (ref 135–145)

## 2019-07-25 MED ORDER — HYDROCODONE-ACETAMINOPHEN 5-325 MG PO TABS: 1.0000 | ORAL_TABLET | Freq: Four times a day (QID) | ORAL | 0 refills | Status: DC | PRN

## 2019-07-25 MED ORDER — HYDROCODONE-ACETAMINOPHEN 5-325 MG PO TABS
1.0000 | ORAL_TABLET | Freq: Once | ORAL | Status: AC
  Administered 2019-07-25: 1 via ORAL
  Filled 2019-07-25: qty 1

## 2019-07-25 NOTE — ED Notes (Signed)
Wound dressed and wrapped with kurlex, patient given extra wound care supplies. Patient calling a ride, will let RN know when the ride is here and we will help her out to the car.

## 2019-07-25 NOTE — ED Notes (Signed)
Negative for DVT per vascular tech.

## 2019-07-25 NOTE — Progress Notes (Signed)
Venous duplex       has been completed. Preliminary results can be found under CV proc through chart review. Ariyanah Aguado, BS, RDMS, RVT   

## 2019-07-25 NOTE — ED Notes (Signed)
Paged Vascular after hours pager. No response at this time.
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
## 2019-07-25 NOTE — ED Notes (Signed)
Patient refusing to leave at this time, requesting to be admitted. Patient demanding multiple popsicles, antibacterial ointment and santyl for her wound and to talk to her doctor.

## 2019-07-25 NOTE — Discharge Instructions (Addendum)
1.  You were given instructions for wound care and follow-up after your hospitalization.  Continue to apply Santyl ointment and dressing changes as per the instructions in your discharge from the hospital.  It is very important that you continue to work with the wound center for management of your chronic wound.  Have been given a referral to surgeon specializing in managing chronic wounds. 2.  Follow-up with your family doctor soon as possible for recheck.

## 2019-07-25 NOTE — ED Triage Notes (Signed)
Patient arrives from home with complaints of multiple leg wounds. Patient seen on the 19th for same, admitted, and discharged Saturday. Patient snoring on stretcher on arrival, complaining of 10/10 leg pain. Patient states that she feels the abx is making it worse. Patient states she took tramadol and other medications at home.

## 2019-07-25 NOTE — ED Provider Notes (Addendum)
[facility] DEPT Provider Note   CSN: [id number] Arrival date & time: 07/25/19  [id number]     History Chief Complaint  Patient presents with  . Wound Check    Kelly Novak is a 50 y.o. female.  HPI     This is a 49 year old female with history of bipolar disorder, congestive heart failure, polysubstance abuse who presents with left leg pain.  Patient reports ongoing and worsening left leg pain.  She was seen and evaluated May 21 and had a short admission to the hospital for left lower extremity cellulitis.  She has a chronic wound in that left lower extremity.  She was discharged with clindamycin.  She reports taking her antibiotics.  No fevers.  Rates her pain at 10 out of 10.  She states that she had pain upon leaving the hospital and it has not gotten any better.  She states she is taking Advil PM with no relief.  Reports ongoing substance abuse.  Denies abdominal pain, nausea, vomiting, chest pain, shortness of breath.  Past Medical History:  Diagnosis Date  . Anemia   . Anxiety   . Arthritis   . Bipolar 1 disorder ([place])   . Blood dyscrasia    sickle cell trait  . Chronic back pain   . Chronic systolic (congestive) heart failure ([place])   . GERD (gastroesophageal reflux disease)   . Hypertension   . Obesity   . Polysubstance abuse ([place])   . Shortness of breath    on exertion due to weight  . Stroke [facility])     Patient Active Problem List   Diagnosis Date Noted  . Non-healing wound of left lower extremity 07/14/2019  . AKI (acute kidney injury) ([place])   . Nausea   . Cocaine abuse ([place])   . Marijuana abuse   . Morbid obesity ([place])   . Hypertensive urgency 10/14/2016  . S/P AKA (above knee amputation) unilateral, right ([place]) 09/17/2016  . Cellulitis 08/31/2016  . Bipolar disorder ([place]) 08/31/2016  . History of stroke 08/07/2016  . Polysubstance abuse ([place])   . Chronic systolic (congestive) heart failure ([place])   . Depression  11/03/2015  . GERD (gastroesophageal reflux disease) 11/03/2015  . Pain in the abdomen 11/03/2015  . Chronic back pain   . Uterine fibroids 08/10/2013  . ASCUS pap with negative HRHPV 08/10/2013  . HTN (hypertension), malignant 06/06/2013  . Pes planus (flat feet) 09/02/2012  . Anemia 09/02/2012  . Smoker 09/02/2012    Past Surgical History:  Procedure Laterality Date  . AMPUTATION Right 09/04/2016   Procedure: Right Above Knee Amputation;  Surgeon: Newt Minion, MD;  Location: [facility];  Service: Orthopedics;  Laterality: Right;  . CESAREAN SECTION    . CHOLECYSTECTOMY    . HYSTEROSCOPY N/A 08/23/2013   Procedure: HYSTEROSCOPY D&C WITH HYDROTHERMAL ABLATION;  Surgeon: Osborne Oman, MD;  Location: [facility];  Service: Gynecology;  Laterality: N/A;  . I & D EXTREMITY Right 09/02/2016   Procedure: IRRIGATION AND DEBRIDEMENT EXTREMITY;  Surgeon: Newt Minion, MD;  Location: [facility];  Service: Orthopedics;  Laterality: Right;  . TUBAL LIGATION       OB History    Gravida  4   Para  4   Term  4   Preterm      AB      Living  2     SAB      TAB      Ectopic  Multiple      Live Births  4           Family History  Problem Relation Age of Onset  . CAD Father   . Heart failure Brother   . Hypertension Brother   . CAD Paternal Aunt     Social History   Tobacco Use  . Smoking status: Current Every Day Smoker    Packs/day: 0.50    Years: 14.00    Pack years: 7.00    Types: Cigarettes  . Smokeless tobacco: Never Used  Substance Use Topics  . Alcohol use: Not Currently  . Drug use: Yes    Types: Marijuana, Cocaine    Home Medications Prior to Admission medications   Medication Sig Start Date End Date Taking? Authorizing Provider  albuterol (VENTOLIN HFA) 108 (90 Base) MCG/ACT inhaler Inhale 2 puffs into the lungs every 4 (four) hours as needed for wheezing.  11/09/18   [provider]  amLODipine (NORVASC) 10 MG tablet Take 10 mg by mouth  daily. 11/19/18   [provider]  carvedilol (COREG) 25 MG tablet Take 25 mg by mouth 2 (two) times daily. 12/19/18   [provider]  cloNIDine (CATAPRES) 0.3 MG tablet Take 1 tablet (0.3 mg total) by mouth 3 (three) times daily. 07/14/19 08/13/19  Guilford Shi, MD  collagenase (SANTYL) ointment Apply topically daily. 07/15/19   Guilford Shi, MD  gabapentin (NEURONTIN) 300 MG capsule Take 300 mg by mouth 3 (three) times daily. 12/06/18   [provider]  hydrALAZINE (APRESOLINE) 100 MG tablet Take 1 tablet (100 mg total) by mouth every 8 (eight) hours. Patient taking differently: Take 100 mg by mouth 2 (two) times daily.  09/22/17   Fransico Meadow, PA-C  hydrocerin (EUCERIN) CREA Apply 1 application topically daily. 07/15/19   Guilford Shi, MD  hydrochlorothiazide (HYDRODIURIL) 25 MG tablet Take 1 tablet (25 mg total) by mouth daily. 07/15/19   Guilford Shi, MD  PARoxetine (PAXIL) 20 MG tablet Take 20 mg by mouth daily. 12/06/18   [provider]  Vitamin D, Ergocalciferol, (DRISDOL) 1.25 MG (50000 UT) CAPS capsule Take 50,000 Units by mouth every Sunday.    [provider]  VRAYLAR capsule Take 1.5 mg by mouth daily. 11/21/18   [provider]  metoprolol succinate (TOPROL-XL) 100 MG 24 hr tablet Take 1 tablet (100 mg total) by mouth daily. Take with or immediately following a meal. Patient not taking: Reported on 12/31/2018 09/22/17 12/31/18  Fransico Meadow, PA-C    Allergies    Tape  Review of Systems   Review of Systems  Constitutional: Negative for fever.  Respiratory: Negative for shortness of breath.   Cardiovascular: Negative for chest pain.  Gastrointestinal: Negative for abdominal pain, nausea and vomiting.  Genitourinary: Negative for dysuria.  Skin: Positive for color change and wound.  All other systems reviewed and are negative.   Physical Exam Updated Vital Signs BP (!) 194/113   Pulse 98   Temp 98.4  F (36.9 C) (Oral)   Resp 20   LMP 08/06/2013   SpO2 96%   Physical Exam Vitals and nursing note reviewed.  Constitutional:      Appearance: She is well-developed.     Comments: Morbidly obese, appears intoxicated, slurring her words  HENT:     Head: Normocephalic and atraumatic.     Mouth/Throat:     Mouth: Mucous membranes are moist.  Eyes:     Pupils: Pupils are equal, round,  and reactive to light.  Cardiovascular:     Rate and Rhythm: Normal rate and regular rhythm.     Heart sounds: Normal heart sounds.  Pulmonary:     Effort: Pulmonary effort is normal. No respiratory distress.     Breath sounds: No wheezing.  Abdominal:     General: Bowel sounds are normal.     Palpations: Abdomen is soft.  Musculoskeletal:     Cervical back: Neck supple.     Left lower leg: Edema present.     Comments: Right-sided BKA  Skin:    General: Skin is warm and dry.     Comments: 2 x 3 ulcerative wound over the lateral aspect of the left lower extremity, no significant surrounding erythema, there is 1+ lower extremity edema, patient has general hyperemia of the left lower extremity and foot with good capillary refill, 1+ DP pulse  Neurological:     Mental Status: She is alert and oriented to person, place, and time.  Psychiatric:        Mood and Affect: Mood normal.     ED Results / Procedures / Treatments   Labs (all labs ordered are listed, but only abnormal results are displayed) Labs Reviewed  CBC WITH DIFFERENTIAL/PLATELET - Abnormal; Notable for the following components:      Result Value   WBC 11.3 (*)    RBC 5.72 (*)    Hemoglobin 10.7 (*)    MCV 64.0 (*)    MCH 18.7 (*)    MCHC 29.2 (*)    RDW 19.1 (*)    nRBC 0.4 (*)    Abs Immature Granulocytes 0.17 (*)    All other components within normal limits  BASIC METABOLIC PANEL - Abnormal; Notable for the following components:   Glucose, Bld 119 (*)    Calcium 8.3 (*)    All other components within normal limits     EKG None  Radiology No results found.  Procedures Procedures (including critical care time)  Medications Ordered in ED Medications  HYDROcodone-acetaminophen (NORCO/VICODIN) 5-325 MG per tablet 1 tablet (0 tablets Oral Hold 07/25/19 [id number])    ED Course  I have reviewed the triage vital signs and the nursing notes.  Pertinent labs & imaging results that were available during my care of the patient were reviewed by me and considered in my medical decision making (see chart for details).    MDM Rules/Calculators/A&P                       Patient presents with acute on chronic left leg pain.  Known chronic wound of the left lower extremity with recent hospitalization for cellulitis.  While in the hospital was given antibiotics and was discharged on clindamycin.  Previously had been on Keflex.  She denies any infectious symptoms at home including fever.  She is afebrile here.  Vital signs otherwise notable for blood pressure 189/109.  She had similar blood pressures upon admission and was treated for high range blood pressures.  She improved with her home blood pressure medications.  She has what appears to be an acute on chronic wound.  She has some mild hyperemia of that left lower extremity.  She has good capillary refill.  No signs or symptoms of gangrene.  Not clear whether this is truly recurrent cellulitis.  She reports compliance with her antibiotics.  Will obtain some basic lab work.  I do not see in her chart where she has had any  DVT or vascular studies.  Given acute on chronic nature, will obtain a DVT study to rule out blood clot given her pain.  If negative, feel patient can be discharged home.  Regarding her high blood pressure, she has no other physical complaints.  Doubt hypertensive urgency or emergency.  7:29 AM Patient sound asleep.  Has not required any pain medication.  Final Clinical Impression(s) / ED Diagnoses Final diagnoses:  Left leg pain  Wound of left lower  extremity, initial encounter  Essential hypertension    Rx / DC Orders ED Discharge Orders    None       Tayshawn Purnell, Barbette Hair, MD 07/25/19 [id number]    Merryl Hacker, MD 07/25/19 0730

## 2019-07-25 NOTE — ED Notes (Signed)
Patient has spoken with her MD and charge RN x2. Requesting more popsicles and a lunch tray. Calling [facility] transport for a ride home.

## 2019-07-25 NOTE — ED Provider Notes (Addendum)
Chronic wound. Already taking Clindamycin. If [place] negative, ok for D/C. Physical Exam  BP (!) 189/109 (BP Location: Right Wrist)   Pulse 98   Temp 98.4 F (36.9 C) (Oral)   Resp 20   LMP 08/06/2013   SpO2 100%   Physical Exam  ED Course/Procedures     Procedures  MDM  DVT study no acute DVT identified.  Will have patient's wound redressed.  She had discharge instructions from hospitalization for Santyl ointment and dressing changes on outpatient basis.  Will add ambulatory referral for wound care with plastics.       Charlesetta Shanks, MD 07/25/19 1010  11: 30 a.m. patient refused to leave citing inadequate plan for pain control.  She also had a family member on the phone to talk to me.  There was a very extensive discussion regarding the plan I had put in place for her with follow-up with wound care and reinforcing that at her time of discharge from inpatient stay, there was a prescription for Santyl ointment and home wound care.  Patient and family member were insistent on talking to administration and not leaving the facility if her pain plan was not addressed.  Patient reports that she had been given tramadol previously with no improvement.  I did explain to the patient that I had come in and reviewed all of her findings with her and the plan for wound management follow-up before this encounter with the patient and her family member on the phone.  At that time the patient was very drowsy and I had to repeatedly awaken her to go over the plan.  At the time of discharge the patient is now hyper alert and sitting up eating a lollipop having a rapidfire discussion on the phone.  I have again reviewed the plan.  The conversation between the patient and the family member is circular with heated reiterations of their dissatisfaction with her care, citing that she is being sent home to die.  Patient shows no signs of toxicity or being acutely ill.  She has a chronic wound which requires ongoing  and chronic care and management.  Patient will be given a short course of Vicodin for pain control over the next 2 days until she can get her follow-up established.   Charlesetta Shanks, MD 07/25/19 1156

## 2019-07-27 ENCOUNTER — Institutional Professional Consult (permissible substitution): Payer: Medicare Other | Admitting: Internal Medicine

## 2019-08-02 ENCOUNTER — Institutional Professional Consult (permissible substitution): Payer: Medicare Other | Admitting: Internal Medicine

## 2019-08-03 ENCOUNTER — Encounter: Payer: Self-pay | Admitting: Internal Medicine

## 2019-08-03 ENCOUNTER — Other Ambulatory Visit: Payer: Self-pay

## 2019-08-03 ENCOUNTER — Ambulatory Visit (INDEPENDENT_AMBULATORY_CARE_PROVIDER_SITE_OTHER): Payer: Medicare Other | Admitting: Internal Medicine

## 2019-08-03 VITALS — BP 83/60 | HR 99 | Temp 97.3°F

## 2019-08-03 DIAGNOSIS — S81802A Unspecified open wound, left lower leg, initial encounter: Secondary | ICD-10-CM

## 2019-08-03 MED ORDER — COLLAGENASE 250 UNIT/GM EX OINT: 1.0000 "application " | TOPICAL_OINTMENT | Freq: Every day | CUTANEOUS | 0 refills | Status: DC

## 2019-08-03 NOTE — Patient Instructions (Addendum)
Kelly Novak,  Kelly Novak It was a pleasure seeing you today.  Please follow the instructions below for your wound care  1) Please place santyl ointment on the wound followed by collagen daily  2) followed by non stick pad 3) wrap with gauze 4) cover with ace bandage   You can try over the counter lidocaine cream to help with the pain  Imaging has been ordered as well to assess the blood flow in your leg  Please follow up with me in 2-3 weeks.  Call us at [phone] with any questions or concerns  [facility] is a medical supply company.  We have sent them an order for your supplies.  Please contact them if you do not receive your supplies in the next 72hrs.  Their number is [phone] and website is [email or web address]

## 2019-08-03 NOTE — Progress Notes (Signed)
   Subjective:     Patient ID: Kelly Novak, female    DOB: 1970/04/07, 49 y.o.   MRN: [id number]  Chief Complaint  Patient presents with  . Advice Only    HPI: The patient is a 49 y.o. female with history of obesity, traumatic right AKA 09/10/2016 and history of polysubstance abuse here for chronic ulcer of left leg  Patient reports recent hospitalization for cellulitis of the left lower extremity last month. She reports improvement to the left leg since discharge.  She has a chronic ulcer on the left leg present for the past 6 months She reports using santyl ointment on the wound for the past 2 weeks.  She reports soreness to the area.  She denies purulent drainage, increased warmth and erythema to the wound.   Review of Systems  All other systems reviewed and are negative.    has a past medical history of Anemia, Anxiety, Arthritis, Bipolar 1 disorder ([place]), Blood dyscrasia, Chronic back pain, Chronic systolic (congestive) heart failure ([place]), GERD (gastroesophageal reflux disease), Hypertension, Obesity, Polysubstance abuse ([place]), Shortness of breath, and Stroke ([place]).  has a past surgical history that includes Cholecystectomy; Cesarean section; Tubal ligation; Hysteroscopy (N/A, 08/23/2013); I & D extremity (Right, 09/02/2016); and Amputation (Right, 09/04/2016).  reports that she has been smoking cigarettes. She has a 7.00 pack-year smoking history. She has never used smokeless tobacco. Objective:   Vital Signs BP (!) 83/60 (BP Location: Right Wrist, Patient Position: Sitting, Cuff Size: Large)   Pulse 99   Temp (!) 97.3 F (36.3 C) (Temporal)   LMP 08/06/2013   SpO2 100%  Vital Signs and Nursing Note Reviewed Physical Exam Skin:    Comments: Left leg wound measures: 4.5 x 3.0 x 0.1 cm Sloughing noted throughout Granulation tissue present 1+ left pedal pulse    Assessment/Plan:     ICD-10-CM   1. 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  I recommended obtaining ABIs and these have been ordered.  Due to patient discomfort debridement was not attempted in office.  I recommended continuing to use santyl daily with the addition of collagen dressings.  Supplies will be ordered through [facility]. For wound pain I recommended over the counter lidocaine cream.  Plan -santyl and collagen daily with dressing changes - follow up in 2-3 weeks - lidocaine cream as needed to the wound       Boyd Kerbs, DO 08/03/2019, 11:50 AM

## 2019-08-07 ENCOUNTER — Telehealth: Payer: Self-pay | Admitting: *Deleted

## 2019-08-07 NOTE — Telephone Encounter (Signed)
Faxed order on (08/03/19) to [facility] for supplies for the patient.  Confirmation received.  Copy scanned into the chart.//AB/CMA

## 2019-08-07 NOTE — Telephone Encounter (Signed)
Received Order Status Notification from [facility] stating.  The patient is currently active in a [place] episode.  [facility] has forwarded the order to the [facility] to service the patient with their supply needs.//AB/CMA.

## 2019-08-14 ENCOUNTER — Telehealth: Payer: Self-pay | Admitting: Internal Medicine

## 2019-08-14 NOTE — Telephone Encounter (Signed)
Returned Micheal Murad's call from [employer]. Advised her that patient can use lidocaine cream as needed on the wound, alternate tylenol and ibuprofen. [name] indicated she did not have any lidocaine. Suggested she can get it OTC at the drug store.

## 2019-08-14 NOTE — Telephone Encounter (Signed)
[name] from [facility] left VM on Friday that patient is having a lot of pain with the wound on her lower left leg. Pain is reported to be a 9 out of 10 to the Nurse. No additional information left by nurse on message. Her callback [phone].

## 2019-08-16 ENCOUNTER — Ambulatory Visit (HOSPITAL_COMMUNITY): Payer: Medicare Other

## 2019-08-22 ENCOUNTER — Telehealth (HOSPITAL_COMMUNITY): Payer: Self-pay

## 2019-08-23 ENCOUNTER — Ambulatory Visit (HOSPITAL_COMMUNITY): Payer: Medicare Other | Attending: Internal Medicine

## 2019-08-24 ENCOUNTER — Ambulatory Visit: Payer: Medicare Other | Admitting: Internal Medicine

## 2019-09-01 ENCOUNTER — Emergency Department (HOSPITAL_COMMUNITY)
Admission: EM | Admit: 2019-09-01 | Discharge: 2019-09-01 | Disposition: A | Discharge status: Home / Self Care | Payer: Medicare Other | Attending: Emergency Medicine | Admitting: Emergency Medicine

## 2019-09-01 ENCOUNTER — Encounter (HOSPITAL_COMMUNITY): Payer: Self-pay | Admitting: Emergency Medicine

## 2019-09-01 ENCOUNTER — Emergency Department (HOSPITAL_COMMUNITY): Payer: Medicare Other

## 2019-09-01 ENCOUNTER — Other Ambulatory Visit: Payer: Self-pay

## 2019-09-01 DIAGNOSIS — Z79899 Other long term (current) drug therapy: Secondary | ICD-10-CM | POA: Diagnosis not present

## 2019-09-01 DIAGNOSIS — I5022 Chronic systolic (congestive) heart failure: Secondary | ICD-10-CM | POA: Insufficient documentation

## 2019-09-01 DIAGNOSIS — R000 Tachycardia, unspecified: Secondary | ICD-10-CM | POA: Insufficient documentation

## 2019-09-01 DIAGNOSIS — F17210 Nicotine dependence, cigarettes, uncomplicated: Secondary | ICD-10-CM | POA: Diagnosis not present

## 2019-09-01 DIAGNOSIS — I110 Hypertensive heart disease with heart failure: Secondary | ICD-10-CM | POA: Insufficient documentation

## 2019-09-01 DIAGNOSIS — N3001 Acute cystitis with hematuria: Secondary | ICD-10-CM | POA: Insufficient documentation

## 2019-09-01 DIAGNOSIS — R1032 Left lower quadrant pain: Secondary | ICD-10-CM | POA: Diagnosis present

## 2019-09-01 LAB — RAPID URINE DRUG SCREEN, HOSP PERFORMED
Amphetamines: NOT DETECTED
Barbiturates: NOT DETECTED
Benzodiazepines: NOT DETECTED
Cocaine: POSITIVE — AB
Opiates: NOT DETECTED
Tetrahydrocannabinol: POSITIVE — AB

## 2019-09-01 LAB — COMPREHENSIVE METABOLIC PANEL
ALT: 21 U/L (ref 0–44)
AST: 22 U/L (ref 15–41)
Albumin: 3.3 g/dL — ABNORMAL LOW (ref 3.5–5.0)
Alkaline Phosphatase: 103 U/L (ref 38–126)
Anion gap: 13 (ref 5–15)
BUN: 9 mg/dL (ref 6–20)
CO2: 22 mmol/L (ref 22–32)
Calcium: 8.5 mg/dL — ABNORMAL LOW (ref 8.9–10.3)
Chloride: 103 mmol/L (ref 98–111)
Creatinine, Ser: 0.78 mg/dL (ref 0.44–1.00)
GFR calc Af Amer: >60 mL/min (ref >60)
GFR calc non Af Amer: >60 mL/min (ref >60)
Glucose, Bld: 165 mg/dL — ABNORMAL HIGH (ref 70–99)
Potassium: 4.1 mmol/L (ref 3.5–5.1)
Sodium: 138 mmol/L (ref 135–145)
Total Bilirubin: 0.6 mg/dL (ref 0.3–1.2)
Total Protein: 6.7 g/dL (ref 6.5–8.1)

## 2019-09-01 LAB — CBC WITH DIFFERENTIAL/PLATELET
Abs Immature Granulocytes: 0.05 10*3/uL (ref 0.00–0.07)
Basophils Absolute: 0.1 10*3/uL (ref 0.0–0.1)
Basophils Relative: 1 %
Eosinophils Absolute: 0.3 10*3/uL (ref 0.0–0.5)
Eosinophils Relative: 4 %
HCT: 38.2 % (ref 36.0–46.0)
Hemoglobin: 11.1 g/dL — ABNORMAL LOW (ref 12.0–15.0)
Immature Granulocytes: 1 %
Lymphocytes Relative: 20 %
Lymphs Abs: 1.7 10*3/uL (ref 0.7–4.0)
MCH: 18.6 pg — ABNORMAL LOW (ref 26.0–34.0)
MCHC: 29.1 g/dL — ABNORMAL LOW (ref 30.0–36.0)
MCV: 64 fL — ABNORMAL LOW (ref 80.0–100.0)
Monocytes Absolute: 0.5 10*3/uL (ref 0.1–1.0)
Monocytes Relative: 6 %
Neutro Abs: 6.1 10*3/uL (ref 1.7–7.7)
Neutrophils Relative %: 68 %
Platelets: 325 10*3/uL (ref 150–400)
RBC: 5.97 MIL/uL — ABNORMAL HIGH (ref 3.87–5.11)
RDW: 19.3 % — ABNORMAL HIGH (ref 11.5–15.5)
WBC: 8.8 10*3/uL (ref 4.0–10.5)
nRBC: 0 % (ref 0.0–0.2)

## 2019-09-01 LAB — URINALYSIS, ROUTINE W REFLEX MICROSCOPIC
Bilirubin Urine: NEGATIVE
Glucose, UA: NEGATIVE mg/dL
Ketones, ur: NEGATIVE mg/dL
Nitrite: NEGATIVE
Protein, ur: 100 mg/dL — AB
Specific Gravity, Urine: >1.046 — ABNORMAL HIGH (ref 1.005–1.030)
pH: 5 (ref 5.0–8.0)

## 2019-09-01 LAB — PREGNANCY, URINE: Preg Test, Ur: NEGATIVE

## 2019-09-01 LAB — TROPONIN I (HIGH SENSITIVITY)
Troponin I (High Sensitivity): 20 ng/L — ABNORMAL HIGH (ref <18)
Troponin I (High Sensitivity): 19 ng/L — ABNORMAL HIGH (ref <18)

## 2019-09-01 LAB — LIPASE, BLOOD: Lipase: 18 U/L (ref 11–51)

## 2019-09-01 MED ORDER — SODIUM CHLORIDE (PF) 0.9 % IJ SOLN
INTRAMUSCULAR | Status: AC
  Filled 2019-09-01: qty 50

## 2019-09-01 MED ORDER — HYDROMORPHONE HCL 1 MG/ML IJ SOLN
1.0000 mg | Freq: Once | INTRAMUSCULAR | Status: AC
  Administered 2019-09-01: 1 mg via INTRAVENOUS
  Filled 2019-09-01: qty 1

## 2019-09-01 MED ORDER — ONDANSETRON HCL 4 MG/2ML IJ SOLN
4.0000 mg | Freq: Once | INTRAMUSCULAR | Status: AC
  Administered 2019-09-01: 4 mg via INTRAVENOUS
  Filled 2019-09-01: qty 2

## 2019-09-01 MED ORDER — IOHEXOL 350 MG/ML SOLN
100.0000 mL | Freq: Once | INTRAVENOUS | Status: AC | PRN
  Administered 2019-09-01: 100 mL via INTRAVENOUS

## 2019-09-01 MED ORDER — NITROFURANTOIN MONOHYD MACRO 100 MG PO CAPS
100.0000 mg | ORAL_CAPSULE | Freq: Two times a day (BID) | ORAL | 0 refills | Status: AC
Start: 2019-09-01 — End: ?

## 2019-09-01 NOTE — ED Notes (Signed)
Pt no longer wanted to wait for [facility], pt called her [facility] to pick her up. Pt transported out to front by NT

## 2019-09-01 NOTE — ED Provider Notes (Signed)
[facility] DEPT Provider Note   CSN: [id number] Arrival date & time: 09/01/19  0535     History Chief Complaint  Patient presents with  . Flank Pain    Kelly Novak is a 49 y.o. female.  HPI   Patient is a 49 year old female with a medical history as noted below.  She presents today with 10/10 abdominal pain that began suddenly this morning.  Patient is difficult to assess secondary to body habitus.  Her pain appears to be in the left lateral abdomen, left lower quadrant.  Worsens with palpation.  No history of similar symptoms.  She denies fevers, chills, nausea, vomiting, diarrhea, constipation, dysuria, hematuria, syncope, CP, SOB.      Past Medical History:  Diagnosis Date  . Anemia   . Anxiety   . Arthritis   . Bipolar 1 disorder ([place])   . Blood dyscrasia    sickle cell trait  . Chronic back pain   . Chronic systolic (congestive) heart failure ([place])   . GERD (gastroesophageal reflux disease)   . Hypertension   . Obesity   . Polysubstance abuse ([place])   . Shortness of breath    on exertion due to weight  . Stroke [facility])     Patient Active Problem List   Diagnosis Date Noted  . Non-healing wound of left lower extremity 07/14/2019  . AKI (acute kidney injury) ([place])   . Nausea   . Cocaine abuse ([place])   . Marijuana abuse   . Morbid obesity ([place])   . Hypertensive urgency 10/14/2016  . S/P AKA (above knee amputation) unilateral, right ([place]) 09/17/2016  . Cellulitis 08/31/2016  . Bipolar disorder ([place]) 08/31/2016  . History of stroke 08/07/2016  . Polysubstance abuse ([place])   . Chronic systolic (congestive) heart failure ([place])   . Depression 11/03/2015  . GERD (gastroesophageal reflux disease) 11/03/2015  . Pain in the abdomen 11/03/2015  . Chronic back pain   . Uterine fibroids 08/10/2013  . ASCUS pap with negative HRHPV 08/10/2013  . HTN (hypertension), malignant 06/06/2013  . Pes planus (flat feet) 09/02/2012  .  Anemia 09/02/2012  . Smoker 09/02/2012    Past Surgical History:  Procedure Laterality Date  . AMPUTATION Right 09/04/2016   Procedure: Right Above Knee Amputation;  Surgeon: Newt Minion, MD;  Location: [facility];  Service: Orthopedics;  Laterality: Right;  . CESAREAN SECTION    . CHOLECYSTECTOMY    . HYSTEROSCOPY N/A 08/23/2013   Procedure: HYSTEROSCOPY D&C WITH HYDROTHERMAL ABLATION;  Surgeon: Osborne Oman, MD;  Location: [facility];  Service: Gynecology;  Laterality: N/A;  . I & D EXTREMITY Right 09/02/2016   Procedure: IRRIGATION AND DEBRIDEMENT EXTREMITY;  Surgeon: Newt Minion, MD;  Location: [facility];  Service: Orthopedics;  Laterality: Right;  . TUBAL LIGATION       OB History    Gravida  4   Para  4   Term  4   Preterm      AB      Living  2     SAB      TAB      Ectopic      Multiple      Live Births  4           Family History  Problem Relation Age of Onset  . CAD Father   . Heart failure Brother   . Hypertension Brother   . CAD Paternal 42  Social History   Tobacco Use  . Smoking status: Current Every Day Smoker    Packs/day: 0.50    Years: 14.00    Pack years: 7.00    Types: Cigarettes  . Smokeless tobacco: Never Used  Substance Use Topics  . Alcohol use: Not Currently  . Drug use: Yes    Types: Marijuana, Cocaine    Home Medications Prior to Admission medications   Medication Sig Start Date End Date Taking? Authorizing Provider  albuterol (VENTOLIN HFA) 108 (90 Base) MCG/ACT inhaler Inhale 2 puffs into the lungs every 4 (four) hours as needed for wheezing.  11/09/18   [provider]  amLODipine (NORVASC) 10 MG tablet Take 10 mg by mouth daily. 11/19/18   [provider]  carvedilol (COREG) 25 MG tablet Take 25 mg by mouth 2 (two) times daily. 12/19/18   [provider]  cloNIDine (CATAPRES) 0.3 MG tablet Take 1 tablet (0.3 mg total) by mouth 3 (three) times daily. 07/14/19 08/13/19  Guilford Shi, MD    collagenase (SANTYL) ointment Apply 1 application topically daily. 08/03/19   Kalman Shan Ratliff, DO  gabapentin (NEURONTIN) 300 MG capsule Take 300 mg by mouth 3 (three) times daily. 12/06/18   [provider]  hydrALAZINE (APRESOLINE) 100 MG tablet Take 1 tablet (100 mg total) by mouth every 8 (eight) hours. Patient taking differently: Take 100 mg by mouth 2 (two) times daily.  09/22/17   Fransico Meadow, PA-C  hydrocerin (EUCERIN) CREA Apply 1 application topically daily. 07/15/19   Guilford Shi, MD  hydrochlorothiazide (HYDRODIURIL) 25 MG tablet Take 1 tablet (25 mg total) by mouth daily. 07/15/19   Guilford Shi, MD  HYDROcodone-acetaminophen (NORCO/VICODIN) 5-325 MG tablet Take 1 tablet by mouth every 6 (six) hours as needed for moderate pain or severe pain. Patient not taking: Reported on 08/03/2019 07/25/19   Charlesetta Shanks, MD  PARoxetine (PAXIL) 20 MG tablet Take 20 mg by mouth daily. 12/06/18   [provider]  Vitamin D, Ergocalciferol, (DRISDOL) 1.25 MG (50000 UT) CAPS capsule Take 50,000 Units by mouth every Sunday.    [provider]  VRAYLAR capsule Take 1.5 mg by mouth daily. 11/21/18   [provider]  metoprolol succinate (TOPROL-XL) 100 MG 24 hr tablet Take 1 tablet (100 mg total) by mouth daily. Take with or immediately following a meal. Patient not taking: Reported on 12/31/2018 09/22/17 12/31/18  Fransico Meadow, PA-C    Allergies    Tape  Review of Systems   Review of Systems  All other systems reviewed and are negative. Ten systems reviewed and are negative for acute change, except as noted in the HPI.   Physical Exam Updated Vital Signs BP (!) 163/115   Pulse 100   Temp (!) 97.4 F (36.3 C) (Oral)   Resp (!) 22   Ht 5\' 8"  (1.727 m)   Wt (!) 217.7 kg   LMP 08/06/2013   SpO2 99%   BMI 72.98 kg/m   Physical Exam Vitals and nursing note reviewed.  Constitutional:      General: She is in acute distress.      Appearance: Normal appearance. She is obese. She is not ill-appearing, toxic-appearing or diaphoretic.  HENT:     Head: Normocephalic and atraumatic.     Right Ear: External ear normal.     Left Ear: External ear normal.     Nose: Nose normal.     Mouth/Throat:     Mouth: Mucous membranes are  moist.     Pharynx: Oropharynx is clear. No oropharyngeal exudate or posterior oropharyngeal erythema.  Eyes:     General: No scleral icterus.       Right eye: No discharge.        Left eye: No discharge.     Extraocular Movements: Extraocular movements intact.     Conjunctiva/sclera: Conjunctivae normal.  Cardiovascular:     Rate and Rhythm: Regular rhythm. Tachycardia present.     Pulses: Normal pulses.     Heart sounds: Normal heart sounds. No murmur heard.  No friction rub. No gallop.   Pulmonary:     Effort: Pulmonary effort is normal. No respiratory distress.     Breath sounds: Normal breath sounds. No stridor. No wheezing, rhonchi or rales.     Comments: Lungs clear to auscultation bilaterally, though difficult to auscultate secondary to body habitus. Abdominal:     General: Abdomen is flat.     Palpations: Abdomen is soft.     Tenderness: There is abdominal tenderness.     Comments: Exquisite tenderness noted in the left lateral abdomen and left lower quadrant.  Mild tenderness noted in the epigastric region.  Again, difficult to assess secondary to body habitus.  Musculoskeletal:     Cervical back: Normal range of motion and neck supple. No tenderness.     Comments: AKA noted on the RLE.  Chronic nonhealing wound noted to the left lateral calf.   Skin:    General: Skin is warm and dry.  Neurological:     General: No focal deficit present.     Mental Status: She is alert and oriented to person, place, and time.  Psychiatric:        Mood and Affect: Mood normal.        Behavior: Behavior normal.    ED Results / Procedures / Treatments   Labs (all labs ordered are listed, but  only abnormal results are displayed) Labs Reviewed  URINALYSIS, ROUTINE W REFLEX MICROSCOPIC - Abnormal; Notable for the following components:      Result Value   APPearance HAZY (*)    Specific Gravity, Urine >1.046 (*)    Hgb urine dipstick SMALL (*)    Protein, ur 100 (*)    Leukocytes,Ua LARGE (*)    Bacteria, UA MANY (*)    All other components within normal limits  RAPID URINE DRUG SCREEN, HOSP PERFORMED - Abnormal; Notable for the following components:   Cocaine POSITIVE (*)    Tetrahydrocannabinol POSITIVE (*)    All other components within normal limits  CBC WITH DIFFERENTIAL/PLATELET - Abnormal; Notable for the following components:   RBC 5.97 (*)    Hemoglobin 11.1 (*)    MCV 64.0 (*)    MCH 18.6 (*)    MCHC 29.1 (*)    RDW 19.3 (*)    All other components within normal limits  COMPREHENSIVE METABOLIC PANEL - Abnormal; Notable for the following components:   Glucose, Bld 165 (*)    Calcium 8.5 (*)    Albumin 3.3 (*)    All other components within normal limits  TROPONIN I (HIGH SENSITIVITY) - Abnormal; Notable for the following components:   Troponin I (High Sensitivity) 20 (*)    All other components within normal limits  TROPONIN I (HIGH SENSITIVITY) - Abnormal; Notable for the following components:   Troponin I (High Sensitivity) 19 (*)    All other components within normal limits  PREGNANCY, URINE  LIPASE, BLOOD   EKG  EKG Interpretation  Date/Time:  Friday September 01 2019 09:49:55 EDT Ventricular Rate:  110 PR Interval:    QRS Duration: 80 QT Interval:  344 QTC Calculation: 466 R Axis:   56 Text Interpretation: Sinus tachycardia Nonspecific repol abnormality, inferior leads Minimal ST elevation, lateral leads Baseline wander in lead(s) V1 Confirmed by Davonna Belling [phone]) on 09/01/2019 1:18:25 PM  Radiology US PELVIC COMPLETE W TRANSVAGINAL AND TORSION R/O  Result Date: 09/01/2019 CLINICAL DATA:  Left lower quadrant pain. EXAM: TRANSABDOMINAL  ULTRASOUND OF PELVIS DOPPLER ULTRASOUND OF OVARIES TECHNIQUE: Transabdominal and transvaginal ultrasound examination of the pelvis was performed including evaluation of the uterus, ovaries, adnexal regions, and pelvic cul-de-sac. Color and duplex Doppler ultrasound was utilized to evaluate blood flow to the ovaries. COMPARISON:  CT 10/29/2017. FINDINGS: Uterus: Measurements: 12.5 x 5.0 x 5.5 cm. Multiple fibroids. The largest measures 3.0 cm. Endometrium Thickness: Unable to evaluate. Right ovary Measurements: Not visualized Left ovary Measurements: Questionable visualization, what appears to be the left ovary measures 3.2 cm in maximum diameter. Left ovarian echodensity with shadowing consistent with calcification noted. Similar finding noted on prior CT. Pulsed Doppler evaluation could not be performed. Other: No definite free fluid. This was an extremely limited exam due to patient's body habitus. IMPRESSION: Extremely limited exam due to patient's body habitus. Multiple uterine fibroids. No acute abnormality identified. Electronically Signed   By: Marcello Moores  Register   On: 09/01/2019 10:44   CT Angio Chest/Abd/Pel for Dissection W and/or Wo Contrast  Result Date: 09/01/2019 CLINICAL DATA:  Left lower quadrant abdomen pain EXAM: CT ANGIOGRAPHY CHEST, ABDOMEN AND PELVIS TECHNIQUE: Non-contrast CT of the chest was initially obtained. Multidetector CT imaging through the chest, abdomen and pelvis was performed using the standard protocol during bolus administration of intravenous contrast. Multiplanar reconstructed images and MIPs were obtained and reviewed to evaluate the vascular anatomy. CONTRAST:  114mL OMNIPAQUE IOHEXOL 350 MG/ML SOLN COMPARISON:  December 31, 2018 FINDINGS: CTA CHEST FINDINGS Cardiovascular: Satisfactory opacification of the pulmonary arteries to the segmental level. No evidence of pulmonary embolism. Normal heart size. No pericardial effusion. Mediastinum/Nodes: No enlarged mediastinal, hilar,  or axillary lymph nodes. Thyroid gland, trachea, and esophagus demonstrate no significant findings. Lungs/Pleura: There is no pleural effusion or pneumothorax. No focal lesion is identified. Mild atelectasis possibly pleural thickening is identified posteriorly in the lungs unchanged. Musculoskeletal: Mild degenerative joint changes of spine are identified. Review of the MIP images confirms the above findings. CTA ABDOMEN AND PELVIS FINDINGS VASCULAR Aorta: Normal caliber aorta without aneurysm, dissection, vasculitis or significant stenosis. Celiac: Patent without evidence of aneurysm, dissection, vasculitis or significant stenosis. SMA: Patent without evidence of aneurysm, dissection, vasculitis or significant stenosis. Renals: Both renal arteries are patent without evidence of aneurysm, dissection, vasculitis, fibromuscular dysplasia or significant stenosis. IMA: Patent without evidence of aneurysm, dissection, vasculitis or significant stenosis. Inflow: Patent without evidence of aneurysm, dissection, vasculitis or significant stenosis. Veins: No obvious venous abnormality within the limitations of this arterial phase study. Review of the MIP images confirms the above findings. NON-VASCULAR Hepatobiliary: Diffuse low density of the liver is identified. No focal liver lesion is noted. Patient status post prior cholecystectomy. The biliary tree is normal. Pancreas: Unremarkable. No pancreatic ductal dilatation or surrounding inflammatory changes. Spleen: Normal in size without focal abnormality. Adrenals/Urinary Tract: Adrenal glands are unremarkable. Kidneys are normal, without renal calculi, focal lesion, or hydronephrosis. Bladder is unremarkable. Stomach/Bowel: Stomach is within normal limits. Appendix appears normal. No evidence of bowel wall thickening, distention, or inflammatory  changes. Lymphatic: No acute abnormality identified. Reproductive: Uterus and bilateral adnexa are unremarkable. Other: None.  Musculoskeletal: Degenerative joint changes of the spine are noted. Review of the MIP images confirms the above findings. IMPRESSION: 1. No evidence of aortic aneurysm or dissection. 2. No evidence of pulmonary embolus. 3. No acute abnormality identified in the chest, abdomen, and pelvis. 4. Fatty infiltration of liver. 5. Prior cholecystectomy. Electronically Signed   By: Abelardo Diesel M.D.   On: 09/01/2019 08:32   Procedures Procedures (including critical care time)  Medications Ordered in ED Medications  sodium chloride (PF) 0.9 % injection (has no administration in time range)  HYDROmorphone (DILAUDID) injection 1 mg (1 mg Intravenous Given 09/01/19 0630)  ondansetron (ZOFRAN) injection 4 mg (4 mg Intravenous Given 09/01/19 0629)  iohexol (OMNIPAQUE) 350 MG/ML injection 100 mL (100 mLs Intravenous Contrast Given 09/01/19 0748)   ED Course  I have reviewed the triage vital signs and the nursing notes.  Pertinent labs & imaging results that were available during my care of the patient were reviewed by me and considered in my medical decision making (see chart for details).  Clinical Course as of Sep 01 1535  Fri Sep 01, 2019  0845 Similar to patient's baseline values  Hemoglobin(!): 11.1 [LJ]  0845 Lipase: 18 [LJ]  0845 Afebrile  Temp(!): 97.4 F (36.3 C) [LJ]  0845 Mildly tachycardic  Pulse Rate(!): 106 [LJ]  0847 No evidence of aortic aneurysm or dissection.  No evidence of PE.  No acute abnormality noted in the chest, abdomen, pelvis.  Prior cholecystectomy.  CT Angio Chest/Abd/Pel for Dissection W and/or Wo Contrast [LJ]  1109 Multiple uterine fibroids without acute abnormalities identified.  US PELVIC COMPLETE W TRANSVAGINAL AND TORSION R/O [LJ]  3244 Patient states she uses cocaine intranasally.  She denies any use for "about 3 days".  COCAINE(!): POSITIVE [LJ]    Clinical Course User Index [LJ] Rayna Sexton, PA-C    MDM Rules/Calculators/A&P                          Pt  is a 49 y.o. female that present with a history, physical exam, ED Clinical Course as noted above.   Difficult to perform physical exam in this patient secondary to her body habitus.  Patient was having some vague left-sided and left lower quadrant abdominal pain.  CT dissection study was obtained and was negative for any acute abnormalities.  TVUS also obtained and is negative for torsion. She is mildly tachycardic throughout her stay today.  She is saturating above 99% on room air.  She phonates clearly and coherently.  She is afebrile.  Patient states her pain has mildly resolved at this point.  She states her pain worsens with movement and feels "like a muscle cramp", which apparently she has experienced before in this region.   Because patient has been hypertensive throughout her stay today I obtained a troponin which was initially elevated at 20. 2nd troponin at 19. UDS is positive for cocaine. I discussed this with the patient and she states that she last used cocaine "3 days ago". Her troponin has been stable. She has no complaints of chest pain or SOB.   Patient and I had a lengthy discussion regarding her blood pressure as well as her drug use.  Strongly recommended cessation from cocaine.  We discussed the risks involving cocaine use.   I recommended that she follow-up with her primary care provider to discuss  her high blood pressure.  She was given very strict return precautions and understands return to the emergency department with any new or worsening symptoms.  Her questions were answered and she was amicable to time of discharge.  Note: Portions of this report may have been transcribed using voice recognition software. Every effort was made to ensure accuracy; however, inadvertent computerized transcription errors may be present.    Final Clinical Impression(s) / ED Diagnoses Final diagnoses:  Left lower quadrant abdominal pain  Acute cystitis with hematuria    Rx / DC Orders ED  Discharge Orders         Ordered    nitrofurantoin, macrocrystal-monohydrate, (MACROBID) 100 MG capsule  2 times daily     Discontinue  Reprint     09/01/19 1534           Rayna Sexton, PA-C 09/01/19 1539    Davonna Belling, MD 09/01/19 1540

## 2019-09-01 NOTE — Discharge Instructions (Addendum)
I am prescribing you Macrobid for your UTI.  Please take this twice a day for the next 5 days.  Please follow-up with your primary care provider immediately to discuss your blood pressure.  Return to the emergency department with any chest pain or shortness of breath or worsening symptoms.  It was a pleasure to meet you.

## 2019-09-01 NOTE — ED Notes (Signed)
[facility] called for status update on pt transfer home. It was stated that pt pickup should be within the hour

## 2019-09-01 NOTE — ED Notes (Signed)
Pt on purewick catheter and aware that we need a urine sample. Pt states that she will hit the call bell to let us know when she has a urine sample.

## 2019-09-01 NOTE — ED Provider Notes (Signed)
MSE was initiated and I personally evaluated the patient and placed orders (if any) at  6:09 AM on September 01, 2019.  Patient with history of obesity, GERD, CHF, chronic wound of her left leg presenting with left-sided flank lower abdominal pain that woke her from sleep.  Pain is constant, sharp and stabbing and progressively worsening.  No nausea, vomiting or fever.  She has never had this kind of pain before.  No pain with urination or blood in the urine.  No history of kidney stones.  Previous cholecystectomy.  Still has appendix, uterus and ovaries.  Patient is uncomfortable.  There is tenderness to the left upper and lower quadrants.  Exam limited by body habitus.  No peritoneal signs.  Chronic nonhealing wound of left lower extremity with clean base.  The patient appears stable so that the remainder of the MSE may be completed by another provider.   Ezequiel Essex, MD 09/01/19 [phone]

## 2019-09-01 NOTE — ED Notes (Signed)
Pt currently in Ct

## 2019-09-01 NOTE — ED Notes (Signed)
Pt refused last set vitals

## 2019-09-01 NOTE — ED Notes (Signed)
[facility] called for pt transport home.  

## 2019-09-01 NOTE — ED Notes (Signed)
Transported to CT at this time. 

## 2019-09-01 NOTE — ED Notes (Signed)
Pt is upset stating she wants pain medication but has not been provided any. Pt states "that doctor is more worried about the cocaine in my system but who cares, I am in pain. That's alright this is why I self medicate". It was explained to the pt that she has already been discharged and is only waiting upon transport. It was also explained that the provider has prescribed some antibiotics for her UTI. This nurse provided pt with a snack and something to drink. Pt Leg wounds also cleaned and dressed.

## 2019-09-01 NOTE — ED Triage Notes (Signed)
Pt arrived via EMS from home. Pt has left sided flank pain from her left lower back to her left lower quadrant. EMS states that the pain started 2 hours ago. Pt states this is 10/10 pain and she has never had anything like this before. Pt has amputated right leg and cellulitis on the left ankle.

## 2019-09-06 ENCOUNTER — Ambulatory Visit: Payer: Medicare Other | Admitting: Internal Medicine

## 2019-09-08 ENCOUNTER — Other Ambulatory Visit: Payer: Self-pay | Admitting: Internal Medicine

## 2019-09-11 ENCOUNTER — Ambulatory Visit (HOSPITAL_COMMUNITY): Admission: RE | Admit: 2019-09-11 | Payer: Medicare Other | Source: Ambulatory Visit

## 2019-09-11 NOTE — Telephone Encounter (Signed)
Rx denied.  Patient needs office visit before refilling medication.//AB/CMA

## 2019-10-02 ENCOUNTER — Ambulatory Visit (HOSPITAL_COMMUNITY): Admission: RE | Admit: 2019-10-02 | Payer: Medicare Other | Source: Ambulatory Visit

## 2019-10-04 ENCOUNTER — Ambulatory Visit: Payer: Medicare Other | Admitting: Internal Medicine

## 2019-10-04 ENCOUNTER — Inpatient Hospital Stay (HOSPITAL_COMMUNITY): Admission: RE | Admit: 2019-10-04 | Payer: Medicare Other | Source: Ambulatory Visit

## 2019-10-09 ENCOUNTER — Ambulatory Visit: Payer: Medicare Other | Admitting: Internal Medicine

## 2019-10-11 ENCOUNTER — Ambulatory Visit (HOSPITAL_COMMUNITY)
Admission: RE | Admit: 2019-10-11 | Discharge: 2019-10-11 | Disposition: A | Discharge status: Home / Self Care | Payer: Medicare Other | Source: Ambulatory Visit | Attending: Internal Medicine | Admitting: Internal Medicine

## 2019-10-11 ENCOUNTER — Other Ambulatory Visit: Payer: Self-pay

## 2019-10-11 DIAGNOSIS — S81802A Unspecified open wound, left lower leg, initial encounter: Secondary | ICD-10-CM

## 2019-10-16 ENCOUNTER — Ambulatory Visit: Payer: Medicare Other | Admitting: Internal Medicine

## 2019-10-18 ENCOUNTER — Ambulatory Visit (INDEPENDENT_AMBULATORY_CARE_PROVIDER_SITE_OTHER): Payer: Medicare Other | Admitting: Internal Medicine

## 2019-10-18 ENCOUNTER — Other Ambulatory Visit: Payer: Self-pay

## 2019-10-18 ENCOUNTER — Encounter: Payer: Self-pay | Admitting: Internal Medicine

## 2019-10-18 VITALS — BP 153/89 | HR 79 | Temp 98.7°F

## 2019-10-18 DIAGNOSIS — I83028 Varicose veins of left lower extremity with ulcer other part of lower leg: Secondary | ICD-10-CM

## 2019-10-18 DIAGNOSIS — L97822 Non-pressure chronic ulcer of other part of left lower leg with fat layer exposed: Secondary | ICD-10-CM

## 2019-10-18 MED ORDER — CELECOXIB 200 MG PO CAPS: 200.0000 mg | ORAL_CAPSULE | Freq: Two times a day (BID) | ORAL | 0 refills | Status: AC

## 2019-10-18 MED ORDER — LIDOCAINE 5 % EX OINT: 1.0000 "application " | TOPICAL_OINTMENT | CUTANEOUS | 0 refills | Status: AC | PRN

## 2019-10-18 MED ORDER — COLLAGENASE 250 UNIT/GM EX OINT: 1.0000 "application " | TOPICAL_OINTMENT | Freq: Every day | CUTANEOUS | 0 refills | Status: AC

## 2019-10-18 NOTE — Progress Notes (Addendum)
   Subjective:     Patient ID: Kelly Novak, female    DOB: [birth year], 49 y.o.   MRN: [id number]  Chief Complaint  Patient presents with  . Follow-up    HPI: The patient is a 49 y.o. female female with history of obesity,traumatic right AKA 09/10/2016 and history of polysubstance abuse here for chronic ulcer of left leg.  Patient states that since last clinic visit she has been using Santyl ointment on the lower extremity wounds daily.  She has now developed a wound to the back of her left leg.  Both wounds started spontaneously as small ulcers that have continued to grow in size.  She reports pain to the wound sites.  She denies purulent drainage, increased warmth or erythema to the wound.  She states she has a wound care nurse that comes twice a week.  Review of Systems  All other systems reviewed and are negative.    has a past medical history of Anemia, Anxiety, Arthritis, Bipolar 1 disorder ([place]), Blood dyscrasia, Chronic back pain, Chronic systolic (congestive) heart failure ([place]), GERD (gastroesophageal reflux disease), Hypertension, Obesity, Polysubstance abuse ([place]), Shortness of breath, and Stroke ([place]).  has a past surgical history that includes Cholecystectomy; Cesarean section; Tubal ligation; Hysteroscopy (N/A, 08/23/2013); I & D extremity (Right, 09/02/2016); and Amputation (Right, 09/04/2016).  reports that she has been smoking cigarettes. She has a 7.00 pack-year smoking history. She has never used smokeless tobacco. Objective:   Vital Signs BP (!) 153/89 (BP Location: Left Wrist, Patient Position: Sitting, Cuff Size: Large)   Pulse 79   Temp 98.7 F (37.1 C) (Oral)   LMP 08/06/2013   SpO2 98%  Vital Signs and Nursing Note Reviewed Physical Exam Skin:    Comments: Anterior left leg wound measures: 10.0 x 4.5 x 0.5 cm Posterior left leg wound measures: 2.5 x 2.5 x 0.3 cm Sloughing noted throughout 1+ left pedal pulse       Assessment/Plan:     ICD-10-CM    1. Venous stasis ulcer of other part of left lower leg with fat layer exposed, unspecified whether varicose veins present (HCC)  J19.417    E08.144    Assessment: Venous stasis ulcers to the left lower extremity  Patient now has two wounds.  The original wound is larger and she has developed a new one to the back of the leg.  No signs of infection today.  Unfortunately the patient has rescheduled multiple times and I haven't seen her since June.  She states she has a wound care nurse and we will reach out for orders.  I recommended using Santyl with collagen daily.  ABIs were obtained and left ankle-brachial index was 1.15.  Results discussed with patient.   Will also refer to vascular for further imaging recommendations due to her resting pain.  For pain I recommended using Celebrex and lidocaine ointment.  Upon further review of the EMR and [place] narcotic database she has been receiving tramadol from her pcp for the past year.  I will defer to her pcp for any control substance prescribing since already established with them and previous high risk urine toxicology.    Plan -Santyl and collagen dressing changes daily with nonstick pad and Kerlix -Celebrex -Lidocaine ointment -Follow-up in 2 weeks - vascular referral -venous reflux study   Boyd Kerbs, DO 10/18/2019, 2:54 PM

## 2019-10-18 NOTE — Patient Instructions (Signed)
Kelly Novak It was a pleasure seeing you today.  Please follow the instructions below for your wound care  1) santyl and collagen dressing changes daily 2) followed by non stick pad 3) wrap with gauze 4) cover with ace bandage   You can use celebrex for your pain.  You can also use lidocaine ointment on the wound for pain relief.   Please follow up with me in 2 weeks.  Call us at [phone] with any questions or concerns

## 2019-10-19 ENCOUNTER — Telehealth: Payer: Self-pay | Admitting: *Deleted

## 2019-10-19 NOTE — Telephone Encounter (Signed)
Faxed recent wound care orders for the patient to [name].   Confirmation received and copy scanned into the chart.//AB/CMA

## 2019-10-20 NOTE — Addendum Note (Signed)
Addended by: Kalman Shan R on: 10/20/2019 10:19 AM   Modules accepted: Orders

## 2019-10-26 ENCOUNTER — Telehealth: Payer: Self-pay | Admitting: *Deleted

## 2019-10-26 NOTE — Telephone Encounter (Signed)
Received physician orders on (10/19/19) via of fax from [facility] requesting signature and return.  Given to provider to complete.    Orders signed and faxed back on (10/25/19) to [facility].  Confirmation received and copy scanned into the chart.//AB/CMA

## 2019-11-01 ENCOUNTER — Ambulatory Visit: Payer: Medicare Other | Admitting: Internal Medicine

## 2019-11-06 ENCOUNTER — Telehealth: Payer: Self-pay | Admitting: *Deleted

## 2019-11-06 NOTE — Telephone Encounter (Signed)
Received request for Prior Authorization on (10/18/19) from [facility] for the Lidocaine 5% Top/Oral Oint (Sprmt).  Called [facility] regarding the PA to see what the cost out of pocket would be for the patient.  Was informed out of pocket for the patient would be ($315.99) for 30 Gram.    Called the patient on (10/18/19) and informed her of the information and she stated that she will not be able to get the RX.    Patient was upset and stated she was in a lot of pain.  Patient requested a Rx for Tramdol.  Informed the patient that I will need to speak with Dr. Heber Graham and get back with her.    Spoke with Dr. Heber [place] and informed her of the PA for the Lidocaine and the cost out of pocket for it, and the  patient's request for the Tramdol.    Per Dr. Heber Simms let the patient know that she will need to call her PCP for the refill on the Tramdol, since they have been prescribing it for her.  Patient verbalized understanding and agreed to call her PCP.//AB/CMA

## 2019-11-08 ENCOUNTER — Ambulatory Visit (INDEPENDENT_AMBULATORY_CARE_PROVIDER_SITE_OTHER): Payer: Medicare Other | Admitting: Internal Medicine

## 2019-11-08 ENCOUNTER — Encounter: Payer: Self-pay | Admitting: Internal Medicine

## 2019-11-08 ENCOUNTER — Other Ambulatory Visit: Payer: Self-pay

## 2019-11-08 VITALS — HR 85 | Temp 98.1°F

## 2019-11-08 DIAGNOSIS — I83028 Varicose veins of left lower extremity with ulcer other part of lower leg: Secondary | ICD-10-CM | POA: Diagnosis not present

## 2019-11-08 DIAGNOSIS — L97822 Non-pressure chronic ulcer of other part of left lower leg with fat layer exposed: Secondary | ICD-10-CM

## 2019-11-08 NOTE — Patient Instructions (Addendum)
Kelly Novak It was a pleasure seeing you today.  Please follow the instructions below for your wound care  1) santyl dressing changes daily 2) followed by non stick pad 3) wrap with gauze 4) cover with ace bandage   Please follow up with me in 3 weeks.  Call us at [phone] with any questions or concerns

## 2019-11-08 NOTE — Progress Notes (Signed)
   Subjective:     Patient ID: Kelly Novak, female    DOB: May 04, 1970, 49 y.o.   MRN: [id number]  Chief Complaint  Patient presents with  . Follow-up of left leg ulcers    HPI: The patient is a 49 y.o. 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  Patient states that she is using Santyl ointment on the lower extremity wounds daily.  She was using collagen however states this is causing burning pain and has stopped.  She reports continued pain to the wound sites.  She denies purulent drainage, increased warmth or erythema to the wound.    Review of Systems  All other systems reviewed and are negative.    has a past medical history of Anemia, Anxiety, Arthritis, Bipolar 1 disorder ([place]), Blood dyscrasia, Chronic back pain, Chronic systolic (congestive) heart failure ([place]), GERD (gastroesophageal reflux disease), Hypertension, Obesity, Polysubstance abuse ([place]), Shortness of breath, and Stroke ([place]).  has a past surgical history that includes Cholecystectomy; Cesarean section; Tubal ligation; Hysteroscopy (N/A, 08/23/2013); I & D extremity (Right, 09/02/2016); and Amputation (Right, 09/04/2016).  reports that she has been smoking cigarettes. She has a 7.00 pack-year smoking history. She has never used smokeless tobacco. Objective:   Vital Signs Pulse 85   Temp 98.1 F (36.7 C) (Oral)   LMP 08/06/2013   SpO2 98%  Vital Signs and Nursing Note Reviewed Physical Exam Skin:    Comments:  Anterior left leg wound measures: 10.5 x 4.5 x 0.5 cm Posterior left leg wound measures: 3.0 x 3.0 x 0.3 cm Devitalized tissue noted throughout 1+ left pedal pulse      Assessment/Plan:     ICD-10-CM   1. Venous stasis ulcer of other part of left lower leg with fat layer exposed, unspecified whether varicose veins present (HCC)  I83.028 Ambulatory referral to Vascular Surgery   L97.822 AMB referral  to wound care center   Assessment:  Venous stasis ulcers to the left lower extremity  The patient's lower extremity leg ulcers are stable with no signs of infection.  Unfortunately patient will not allow me to touch her leg due to pain.  She declined the use of compression wraps.  Referral to vascular was made at last clinic visit and patient is awaiting call for scheduling.  I think at this time due to resources patient would be best suited at the wound care center.  I will continue seeing her until she is able to transition there.  I recommended continuing to use Santyl daily.  She can use lidocaine ointment and Celebrex as needed for pain.  Plan -Santyl daily with dressing changes of nonstick pad and Kerlix -Celebrex and lidocaine ointment -Follow-up in 3 weeks  Boyd Kerbs, DO 11/09/2019, 11:47 AM

## 2019-11-13 ENCOUNTER — Emergency Department (HOSPITAL_COMMUNITY): Payer: Medicare Other

## 2019-11-13 ENCOUNTER — Other Ambulatory Visit: Payer: Self-pay

## 2019-11-13 ENCOUNTER — Emergency Department (HOSPITAL_COMMUNITY)
Admission: EM | Admit: 2019-11-13 | Discharge: 2019-11-13 | Disposition: A | Discharge status: Home / Self Care | Payer: Medicare Other | Attending: Emergency Medicine | Admitting: Emergency Medicine

## 2019-11-13 DIAGNOSIS — I5022 Chronic systolic (congestive) heart failure: Secondary | ICD-10-CM | POA: Diagnosis not present

## 2019-11-13 DIAGNOSIS — Z20822 Contact with and (suspected) exposure to covid-19: Secondary | ICD-10-CM | POA: Diagnosis not present

## 2019-11-13 DIAGNOSIS — R4182 Altered mental status, unspecified: Secondary | ICD-10-CM | POA: Diagnosis present

## 2019-11-13 DIAGNOSIS — R404 Transient alteration of awareness: Secondary | ICD-10-CM

## 2019-11-13 DIAGNOSIS — J988 Other specified respiratory disorders: Secondary | ICD-10-CM | POA: Insufficient documentation

## 2019-11-13 DIAGNOSIS — R4189 Other symptoms and signs involving cognitive functions and awareness: Secondary | ICD-10-CM

## 2019-11-13 DIAGNOSIS — Z79899 Other long term (current) drug therapy: Secondary | ICD-10-CM | POA: Diagnosis not present

## 2019-11-13 DIAGNOSIS — L89899 Pressure ulcer of other site, unspecified stage: Secondary | ICD-10-CM | POA: Diagnosis not present

## 2019-11-13 DIAGNOSIS — I110 Hypertensive heart disease with heart failure: Secondary | ICD-10-CM | POA: Diagnosis not present

## 2019-11-13 DIAGNOSIS — R464 Slowness and poor responsiveness: Secondary | ICD-10-CM | POA: Diagnosis not present

## 2019-11-13 DIAGNOSIS — R0689 Other abnormalities of breathing: Secondary | ICD-10-CM

## 2019-11-13 LAB — COMPREHENSIVE METABOLIC PANEL
ALT: 28 U/L (ref 0–44)
AST: 31 U/L (ref 15–41)
Albumin: 3.4 g/dL — ABNORMAL LOW (ref 3.5–5.0)
Alkaline Phosphatase: 96 U/L (ref 38–126)
Anion gap: 12 (ref 5–15)
BUN: 22 mg/dL — ABNORMAL HIGH (ref 6–20)
CO2: 23 mmol/L (ref 22–32)
Calcium: 8.4 mg/dL — ABNORMAL LOW (ref 8.9–10.3)
Chloride: 103 mmol/L (ref 98–111)
Creatinine, Ser: 1.21 mg/dL — ABNORMAL HIGH (ref 0.44–1.00)
GFR calc Af Amer: >60 mL/min (ref >60)
GFR calc non Af Amer: 53 mL/min — ABNORMAL LOW (ref >60)
Glucose, Bld: 123 mg/dL — ABNORMAL HIGH (ref 70–99)
Potassium: 4 mmol/L (ref 3.5–5.1)
Sodium: 138 mmol/L (ref 135–145)
Total Bilirubin: 0.7 mg/dL (ref 0.3–1.2)
Total Protein: 6.9 g/dL (ref 6.5–8.1)

## 2019-11-13 LAB — CBC WITH DIFFERENTIAL/PLATELET
Abs Immature Granulocytes: 0 10*3/uL (ref 0.00–0.07)
Basophils Absolute: 0.2 10*3/uL — ABNORMAL HIGH (ref 0.0–0.1)
Basophils Relative: 2 %
Eosinophils Absolute: 0.3 10*3/uL (ref 0.0–0.5)
Eosinophils Relative: 3 %
HCT: 40 % (ref 36.0–46.0)
Hemoglobin: 11.4 g/dL — ABNORMAL LOW (ref 12.0–15.0)
Lymphocytes Relative: 10 %
Lymphs Abs: 0.9 10*3/uL (ref 0.7–4.0)
MCH: 18.4 pg — ABNORMAL LOW (ref 26.0–34.0)
MCHC: 28.5 g/dL — ABNORMAL LOW (ref 30.0–36.0)
MCV: 64.5 fL — ABNORMAL LOW (ref 80.0–100.0)
Monocytes Absolute: 0.3 10*3/uL (ref 0.1–1.0)
Monocytes Relative: 3 %
Neutro Abs: 7.4 10*3/uL (ref 1.7–7.7)
Neutrophils Relative %: 82 %
Platelets: 351 10*3/uL (ref 150–400)
RBC: 6.2 MIL/uL — ABNORMAL HIGH (ref 3.87–5.11)
RDW: 19.3 % — ABNORMAL HIGH (ref 11.5–15.5)
WBC: 9 10*3/uL (ref 4.0–10.5)
nRBC: 0 /100 WBC
nRBC: 0.2 % (ref 0.0–0.2)

## 2019-11-13 LAB — I-STAT VENOUS BLOOD GAS, ED
Acid-Base Excess: 0 mmol/L (ref 0.0–2.0)
Bicarbonate: 24.7 mmol/L (ref 20.0–28.0)
Calcium, Ion: 1.04 mmol/L — ABNORMAL LOW (ref 1.15–1.40)
HCT: 39 % (ref 36.0–46.0)
Hemoglobin: 13.3 g/dL (ref 12.0–15.0)
O2 Saturation: 95 %
Potassium: 4 mmol/L (ref 3.5–5.1)
Sodium: 140 mmol/L (ref 135–145)
TCO2: 26 mmol/L (ref 22–32)
pCO2, Ven: 40.1 mmHg — ABNORMAL LOW (ref 44.0–60.0)
pH, Ven: 7.397 (ref 7.250–7.430)
pO2, Ven: 76 mmHg — ABNORMAL HIGH (ref 32.0–45.0)

## 2019-11-13 LAB — I-STAT BETA HCG BLOOD, ED (MC, WL, AP ONLY): I-stat hCG, quantitative: <5 m[IU]/mL (ref <5)

## 2019-11-13 LAB — ETHANOL: Alcohol, Ethyl (B): <10 mg/dL (ref <10)

## 2019-11-13 LAB — ACETAMINOPHEN LEVEL: Acetaminophen (Tylenol), Serum: <10 ug/mL — ABNORMAL LOW (ref 10–30)

## 2019-11-13 LAB — SARS CORONAVIRUS 2 BY RT PCR (HOSPITAL ORDER, PERFORMED IN ~~LOC~~ HOSPITAL LAB): SARS Coronavirus 2: NEGATIVE

## 2019-11-13 LAB — SALICYLATE LEVEL: Salicylate Lvl: <7 mg/dL — ABNORMAL LOW (ref 7.0–30.0)

## 2019-11-13 LAB — TROPONIN I (HIGH SENSITIVITY): Troponin I (High Sensitivity): 18 ng/L — ABNORMAL HIGH (ref <18)

## 2019-11-13 MED ORDER — HYDROCODONE-ACETAMINOPHEN 5-325 MG PO TABS
1.0000 | ORAL_TABLET | Freq: Once | ORAL | Status: AC
  Administered 2019-11-13: 1 via ORAL
  Filled 2019-11-13: qty 1

## 2019-11-13 NOTE — ED Provider Notes (Signed)
[facility] EM[facility] Provider Note   CSN: [id number] Arrival date & time: 11/13/19  1612     History Chief Complaint  Patient presents with  . Altered Mental Status    Kelly Novak is a 49 y.o. female.  Patient brought in by EMS after being found less responsive by her home health aide.  EMS said she was breathing about 4 times a minute.  Initial sats 68% on room air.  They bagged the patient for approximately 30 minutes and eventually got her onto a nonrebreather.  Patient is now alert and oriented.  Setting well on room air now.  She said she smokes marijuana and took 8 Tylenol PM.  Denies she was trying to hurt herself.  Denies any opiates.  Complaining of lower extremity pain due to some sores on her leg.  Denies any chest pain or shortness of breath.  Falls asleep during interview.  The history is provided by the patient and the EMS personnel.  Altered Mental Status Presenting symptoms: unresponsiveness   Severity:  Moderate Most recent episode:  Today Episode history:  Single Duration:  1 hour Progression:  Improving Chronicity:  New Associated symptoms: no abdominal pain, no difficulty breathing, no fever, no headaches, no nausea, no rash, no suicidal behavior and no vomiting        Past Medical History:  Diagnosis Date  . Anemia   . Anxiety   . Arthritis   . Bipolar 1 disorder ([place])   . Blood dyscrasia    sickle cell trait  . Chronic back pain   . Chronic systolic (congestive) heart failure ([place])   . GERD (gastroesophageal reflux disease)   . Hypertension   . Obesity   . Polysubstance abuse ([place])   . Shortness of breath    on exertion due to weight  . Stroke [facility])     Patient Active Problem List   Diagnosis Date Noted  . Non-healing wound of left lower extremity 07/14/2019  . AKI (acute kidney injury) ([place])   . Nausea   . Cocaine abuse ([place])   . Marijuana abuse   . Morbid obesity ([place])   . Hypertensive urgency  10/14/2016  . S/P AKA (above knee amputation) unilateral, right ([place]) 09/17/2016  . Cellulitis 08/31/2016  . Bipolar disorder ([place]) 08/31/2016  . History of stroke 08/07/2016  . Polysubstance abuse ([place])   . Chronic systolic (congestive) heart failure ([place])   . Depression 11/03/2015  . GERD (gastroesophageal reflux disease) 11/03/2015  . Pain in the abdomen 11/03/2015  . Chronic back pain   . Uterine fibroids 08/10/2013  . ASCUS pap with negative HRHPV 08/10/2013  . HTN (hypertension), malignant 06/06/2013  . Pes planus (flat feet) 09/02/2012  . Anemia 09/02/2012  . Smoker 09/02/2012    Past Surgical History:  Procedure Laterality Date  . AMPUTATION Right 09/04/2016   Procedure: Right Above Knee Amputation;  Surgeon: Newt Minion, MD;  Location: [place];  Service: Orthopedics;  Laterality: Right;  . CESAREAN SECTION    . CHOLECYSTECTOMY    . HYSTEROSCOPY N/A 08/23/2013   Procedure: HYSTEROSCOPY D&C WITH HYDROTHERMAL ABLATION;  Surgeon: Osborne Oman, MD;  Location: [facility];  Service: Gynecology;  Laterality: N/A;  . I & D EXTREMITY Right 09/02/2016   Procedure: IRRIGATION AND DEBRIDEMENT EXTREMITY;  Surgeon: Newt Minion, MD;  Location: [facility];  Service: Orthopedics;  Laterality: Right;  . TUBAL LIGATION       OB History  Gravida  4   Para  4   Term  4   Preterm      AB      Living  2     SAB      TAB      Ectopic      Multiple      Live Births  4           Family History  Problem Relation Age of Onset  . CAD Father   . Heart failure Brother   . Hypertension Brother   . CAD Paternal Aunt     Social History   Tobacco Use  . Smoking status: Current Every Day Smoker    Packs/day: 0.50    Years: 14.00    Pack years: 7.00    Types: Cigarettes  . Smokeless tobacco: Never Used  Substance Use Topics  . Alcohol use: Not Currently  . Drug use: Yes    Types: Marijuana, Cocaine    Home Medications Prior to Admission medications   Medication  Sig Start Date End Date Taking? Authorizing Provider  acetaminophen (TYLENOL) 500 MG tablet Take 1,000 mg by mouth 3 (three) times daily as needed for moderate pain.    [provider]  albuterol (VENTOLIN HFA) 108 (90 Base) MCG/ACT inhaler Inhale 2 puffs into the lungs every 4 (four) hours as needed for wheezing.  11/09/18   [provider]  amLODipine (NORVASC) 10 MG tablet Take 10 mg by mouth daily. 11/19/18   [provider]  carvedilol (COREG) 25 MG tablet Take 25 mg by mouth 2 (two) times daily. 12/19/18   [provider]  cloNIDine (CATAPRES) 0.3 MG tablet Take 1 tablet (0.3 mg total) by mouth 3 (three) times daily. 07/14/19 09/01/19  Guilford Shi, MD  collagenase (SANTYL) ointment Apply 1 application topically daily. 10/18/19   Valinda Party, DO  cyclobenzaprine (FLEXERIL) 10 MG tablet Take 10 mg by mouth 3 (three) times daily. 08/26/19   [provider]  gabapentin (NEURONTIN) 800 MG tablet Take 800 mg by mouth 3 (three) times daily. 08/26/19   [provider]  hydrALAZINE (APRESOLINE) 100 MG tablet Take 1 tablet (100 mg total) by mouth every 8 (eight) hours. Patient taking differently: Take 100 mg by mouth 2 (two) times daily.  09/22/17   Fransico Meadow, PA-C  hydrocerin (EUCERIN) CREA Apply 1 application topically daily. 07/15/19   Guilford Shi, MD  hydrochlorothiazide (HYDRODIURIL) 25 MG tablet Take 1 tablet (25 mg total) by mouth daily. 07/15/19   Guilford Shi, MD  lidocaine (XYLOCAINE) 5 % ointment Apply 1 application topically as needed. Patient not taking: Reported on 11/08/2019 10/18/19   Kalman Shan Ratliff, DO  nitrofurantoin, macrocrystal-monohydrate, (MACROBID) 100 MG capsule Take 1 capsule (100 mg total) by mouth 2 (two) times daily. Patient not taking: Reported on 11/08/2019 09/01/19   Rayna Sexton, PA-C  PARoxetine (PAXIL) 20 MG tablet Take 20 mg by mouth daily.  12/06/18   [provider]    traMADol (ULTRAM) 50 MG tablet Take 50 mg by mouth every 6 (six) hours as needed for pain. 08/15/19   [provider]  VRAYLAR capsule Take 1.5 mg by mouth daily. 11/21/18   [provider]  metoprolol succinate (TOPROL-XL) 100 MG 24 hr tablet Take 1 tablet (100 mg total) by mouth daily. Take with or immediately following a meal. Patient not taking: Reported on 12/31/2018 09/22/17 12/31/18  Fransico Meadow, PA-C    Allergies  Tape  Review of Systems   Review of Systems  Constitutional: Negative for fever.  HENT: Negative for sore throat.   Eyes: Negative for visual disturbance.  Respiratory: Negative for shortness of breath.   Cardiovascular: Negative for chest pain.  Gastrointestinal: Negative for abdominal pain, nausea and vomiting.  Genitourinary: Negative for dysuria.  Musculoskeletal: Negative for neck pain.  Skin: Negative for rash.  Neurological: Negative for headaches.    Physical Exam Updated Vital Signs BP (!) 173/113 (BP Location: Right Arm)   Pulse (!) 111   Temp 98.2 F (36.8 C) (Temporal)   Resp 12   LMP 08/06/2013   SpO2 92%   Physical Exam Vitals and nursing note reviewed.  Constitutional:      General: She is not in acute distress.    Appearance: Normal appearance. She is well-developed. She is obese.  HENT:     Head: Normocephalic and atraumatic.  Eyes:     Conjunctiva/sclera: Conjunctivae normal.  Cardiovascular:     Rate and Rhythm: Normal rate and regular rhythm.     Heart sounds: No murmur heard.   Pulmonary:     Effort: Pulmonary effort is normal. No respiratory distress.     Breath sounds: Normal breath sounds.  Abdominal:     Palpations: Abdomen is soft.     Tenderness: There is no abdominal tenderness.  Musculoskeletal:        General: Tenderness present. Normal range of motion.     Cervical back: Neck supple.  Skin:    General: Skin is warm and dry.     Capillary Refill: Capillary refill takes less than 2 seconds.      Comments: Left lower extremity has ulcerations on the anterior and lateral side of her left lower leg.  She follows with wound clinic has a wound care nurse coming twice a week.  Neurological:     General: No focal deficit present.     Mental Status: She is alert.       ED Results / Procedures / Treatments   Labs (all labs ordered are listed, but only abnormal results are displayed) Labs Reviewed  COMPREHENSIVE METABOLIC PANEL - Abnormal; Notable for the following components:      Result Value   Glucose, Bld 123 (*)    BUN 22 (*)    Creatinine, Ser 1.21 (*)    Calcium 8.4 (*)    Albumin 3.4 (*)    GFR calc non Af Amer 53 (*)    All other components within normal limits  CBC WITH DIFFERENTIAL/PLATELET - Abnormal; Notable for the following components:   RBC 6.20 (*)    Hemoglobin 11.4 (*)    MCV 64.5 (*)    MCH 18.4 (*)    MCHC 28.5 (*)    RDW 19.3 (*)    Basophils Absolute 0.2 (*)    All other components within normal limits  ACETAMINOPHEN LEVEL - Abnormal; Notable for the following components:   Acetaminophen (Tylenol), Serum <10 (*)    All other components within normal limits  SALICYLATE LEVEL - Abnormal; Notable for the following components:   Salicylate Lvl <0.1 (*)    All other components within normal limits  I-STAT VENOUS BLOOD GAS, ED - Abnormal; Notable for the following components:   pCO2, Ven 40.1 (*)    pO2, Ven 76.0 (*)    Calcium, Ion 1.04 (*)    All other components within normal limits  TROPONIN I (HIGH SENSITIVITY) - Abnormal; Notable for the following  components:   Troponin I (High Sensitivity) 18 (*)    All other components within normal limits  SARS CORONAVIRUS 2 BY RT PCR (HOSPITAL ORDER, [place] [facility])  ETHANOL  I-STAT BETA HCG BLOOD, ED ([facility], [facility], [facility] ONLY)  TROPONIN I (HIGH SENSITIVITY)    EKG EKG Interpretation  Date/Time:  Monday November 13 2019 16:20:56 EDT Ventricular Rate:  113 PR Interval:    QRS  Duration: 82 QT Interval:  341 QTC Calculation: 468 R Axis:   58 Text Interpretation: Sinus tachycardia Nonspecific repol abnormality, inferior leads Minimal ST elevation, lateral leads Baseline wander in lead(s) II III aVF V3 V6 Poor data quality in current ECG precludes serial comparison Confirmed by Aletta Edouard [phone]) on 11/13/2019 4:25:04 PM   Radiology DG Chest Port 1 View  Result Date: 11/13/2019 CLINICAL DATA:  Shortness of breath. EXAM: PORTABLE CHEST 1 VIEW COMPARISON:  December 31, 2018 FINDINGS: Mild diffusely increased interstitial lung markings are noted bilaterally with mild prominence of the bilateral perihilar pulmonary vasculature. The heart size and mediastinal contours are within normal limits. The visualized skeletal structures are unremarkable. IMPRESSION: Mild pulmonary vascular congestion with mild increased interstitial lung markings, suspicious for early interstitial edema. Electronically Signed   By: Virgina Norfolk M.D.   On: 11/13/2019 17:14    Procedures Procedures (including critical care time)  Medications Ordered in ED Medications  HYDROcodone-acetaminophen (NORCO/VICODIN) 5-325 MG per tablet 1 tablet (1 tablet Oral Given 11/13/19 1856)    ED Course  I have reviewed the triage vital signs and the nursing notes.  Pertinent labs & imaging results that were available during my care of the patient were reviewed by me and considered in my medical decision making (see chart for details).  Clinical Course as of Nov 13 953  Mon Nov 13, 2019  [place] Reassessed patient and hip down her dressing on her leg.  She is got large ulcerations.  Some erythema but no warmth.  She is complaining of a lot of pain there.  Have ordered her some pain medicine as she needs got a much more clear mental status.  Follows with wound clinic so do not know that she necessarily needs to be admitted for this.   [MB]  1952 Patient asked for prescription for pain medicine.  Due to her  possibly overdosing on the Tylenol PMs I do not think it would be a good idea to send her home with a prescription for narcotics.  Recommended that she contact her primary care doctor for any pain medication written   [MB]    Clinical Course User Index [MB] Hayden Rasmussen, MD   MDM Rules/Calculators/A&P                         This patient complains of decreased responsiveness poor respiratory effort hypoxia; this involves an extensive number of treatment Options and is a complaint that carries with it a high risk of complications and Morbidity. The differential includes hypercapnia, overmedication, CHF, COPD, PE, pneumonia, Covid, metabolic derangement  I ordered, reviewed and interpreted labs, which included CBC with normal white count, stable hemoglobin, chemistries normal other than mildly elevated glucose and creatinine, troponin slightly elevated but similar to priors, Covid testing negative, aspirin and Tylenol negative, pregnancy test negative.  Urine tox screen was ordered but patient never gave a sample I ordered medication hydrocodone for patient's leg pain I ordered imaging studies which included chest x-ray and I independently  visualized and interpreted imaging which showed mild vascular congestion Additional history obtained from EMS, patient's home health aide Previous records obtained and reviewed in epic, multiple visits for left leg pain left leg cellulitis hypertensive urgency  After the interventions stated above, I reevaluated the patient and found patient to have improving mental status.  She is now clear and alert not falling asleep.  Saturations remained good on room air.  Do not feel she needs admission for her leg wound as she is followed closely with wound clinic.  Return instructions discussed.   Final Clinical Impression(s) / ED Diagnoses Final diagnoses:  Unresponsive episode  Respiratory depression  Pressure ulcer of left leg, unspecified pressure ulcer  stage    Rx / DC Orders ED Discharge Orders    None       Hayden Rasmussen, MD 11/14/19 [phone]

## 2019-11-13 NOTE — Discharge Instructions (Addendum)
You were seen in the emergency department for being less responsive and not breathing well.  Your symptoms improved during her time in the emergency department.  You had blood work EKG and a chest x-ray that did not show any serious findings.  Please follow-up with your primary care doctor for further evaluation and also with your wound care doctors.  Return to the emergency department for any worsening or concerning symptoms

## 2019-11-13 NOTE — ED Triage Notes (Signed)
Pt's aide found patient unresponsive, breathing approx 4 times per minute. EMS assisted ventilations via BVM x 30 mins while waiting for equipment to move the patient. Initially 68% SpO2 room air, progressed to using NRB, and pt A/O x 4 and 100% SpO2 room air on arrival to ED. Pt reports leg pain, as she has wound to same. Pt reports smoking marijuana in the last 24 hours but denies other recreational drug use.

## 2019-11-24 DEATH — deceased

## 2019-12-13 ENCOUNTER — Encounter (HOSPITAL_BASED_OUTPATIENT_CLINIC_OR_DEPARTMENT_OTHER): Payer: Medicare Other | Admitting: Physician Assistant
